# Patient Record
Sex: Male | Born: 1960 | Race: White | Hispanic: No | Smoking: Current every day smoker
Health system: Southern US, Community
[De-identification: ages and names within clinical notes are randomized; demographics above are authoritative.]

## PROBLEM LIST (undated history)

## (undated) DIAGNOSIS — G629 Polyneuropathy, unspecified: Secondary | ICD-10-CM

## (undated) DIAGNOSIS — I82409 Acute embolism and thrombosis of unspecified deep veins of unspecified lower extremity: Secondary | ICD-10-CM

## (undated) DIAGNOSIS — I10 Essential (primary) hypertension: Secondary | ICD-10-CM

## (undated) DIAGNOSIS — I8290 Acute embolism and thrombosis of unspecified vein: Secondary | ICD-10-CM

## (undated) DIAGNOSIS — M1990 Unspecified osteoarthritis, unspecified site: Secondary | ICD-10-CM

## (undated) HISTORY — PX: APPENDECTOMY: SHX54

## (undated) HISTORY — PX: VEIN LIGATION AND STRIPPING: SHX2653

---

## 1997-12-04 ENCOUNTER — Inpatient Hospital Stay (HOSPITAL_COMMUNITY): Admission: AD | Admit: 1997-12-04 | Discharge: 1997-12-06 | Payer: Self-pay | Admitting: Emergency Medicine

## 1997-12-07 ENCOUNTER — Emergency Department (HOSPITAL_COMMUNITY): Admission: EM | Admit: 1997-12-07 | Discharge: 1997-12-07 | Payer: Self-pay | Admitting: Emergency Medicine

## 1997-12-09 ENCOUNTER — Emergency Department (HOSPITAL_COMMUNITY): Admission: EM | Admit: 1997-12-09 | Discharge: 1997-12-09 | Payer: Self-pay | Admitting: Emergency Medicine

## 1998-09-25 ENCOUNTER — Emergency Department (HOSPITAL_COMMUNITY): Admission: EM | Admit: 1998-09-25 | Discharge: 1998-09-25 | Payer: Self-pay | Admitting: Emergency Medicine

## 1999-08-30 ENCOUNTER — Emergency Department (HOSPITAL_COMMUNITY): Admission: EM | Admit: 1999-08-30 | Discharge: 1999-08-30 | Payer: Self-pay | Admitting: Emergency Medicine

## 1999-08-30 ENCOUNTER — Encounter: Payer: Self-pay | Admitting: Emergency Medicine

## 2000-05-25 ENCOUNTER — Emergency Department (HOSPITAL_COMMUNITY): Admission: EM | Admit: 2000-05-25 | Discharge: 2000-05-25 | Payer: Self-pay | Admitting: Emergency Medicine

## 2003-12-21 ENCOUNTER — Other Ambulatory Visit: Payer: Self-pay

## 2004-01-17 ENCOUNTER — Emergency Department (HOSPITAL_COMMUNITY): Admission: EM | Admit: 2004-01-17 | Discharge: 2004-01-17 | Payer: Self-pay | Admitting: Emergency Medicine

## 2004-02-02 ENCOUNTER — Emergency Department (HOSPITAL_COMMUNITY): Admission: EM | Admit: 2004-02-02 | Discharge: 2004-02-03 | Payer: Self-pay | Admitting: Emergency Medicine

## 2004-02-07 ENCOUNTER — Emergency Department (HOSPITAL_COMMUNITY): Admission: EM | Admit: 2004-02-07 | Discharge: 2004-02-07 | Payer: Self-pay | Admitting: Emergency Medicine

## 2004-04-12 ENCOUNTER — Emergency Department (HOSPITAL_COMMUNITY): Admission: EM | Admit: 2004-04-12 | Discharge: 2004-04-12 | Payer: Self-pay | Admitting: *Deleted

## 2004-05-30 ENCOUNTER — Emergency Department: Payer: Self-pay | Admitting: General Practice

## 2004-06-21 ENCOUNTER — Emergency Department: Payer: Self-pay | Admitting: Emergency Medicine

## 2004-07-31 ENCOUNTER — Emergency Department: Payer: Self-pay | Admitting: Unknown Physician Specialty

## 2004-08-15 ENCOUNTER — Emergency Department: Payer: Self-pay | Admitting: Emergency Medicine

## 2004-08-18 ENCOUNTER — Emergency Department: Payer: Self-pay | Admitting: Emergency Medicine

## 2004-08-19 ENCOUNTER — Emergency Department: Payer: Self-pay | Admitting: Emergency Medicine

## 2004-08-25 ENCOUNTER — Emergency Department: Payer: Self-pay | Admitting: Emergency Medicine

## 2004-08-30 ENCOUNTER — Emergency Department: Payer: Self-pay | Admitting: Emergency Medicine

## 2004-09-16 ENCOUNTER — Emergency Department: Payer: Self-pay | Admitting: General Practice

## 2004-10-11 ENCOUNTER — Emergency Department: Payer: Self-pay | Admitting: Emergency Medicine

## 2004-11-07 ENCOUNTER — Emergency Department: Payer: Self-pay | Admitting: General Practice

## 2004-12-07 ENCOUNTER — Emergency Department: Payer: Self-pay | Admitting: Emergency Medicine

## 2004-12-16 ENCOUNTER — Emergency Department: Payer: Self-pay | Admitting: Emergency Medicine

## 2005-02-20 ENCOUNTER — Emergency Department: Payer: Self-pay | Admitting: Unknown Physician Specialty

## 2005-03-21 ENCOUNTER — Other Ambulatory Visit: Payer: Self-pay

## 2005-03-21 ENCOUNTER — Emergency Department: Payer: Self-pay | Admitting: Internal Medicine

## 2005-05-03 ENCOUNTER — Other Ambulatory Visit: Payer: Self-pay

## 2005-05-03 ENCOUNTER — Emergency Department: Payer: Self-pay | Admitting: Emergency Medicine

## 2005-08-25 ENCOUNTER — Emergency Department: Payer: Self-pay | Admitting: Emergency Medicine

## 2005-08-26 ENCOUNTER — Emergency Department: Payer: Self-pay | Admitting: General Practice

## 2005-09-10 ENCOUNTER — Emergency Department: Payer: Self-pay | Admitting: General Practice

## 2005-12-17 ENCOUNTER — Emergency Department: Payer: Self-pay | Admitting: Unknown Physician Specialty

## 2005-12-17 ENCOUNTER — Emergency Department: Payer: Self-pay | Admitting: Emergency Medicine

## 2006-01-02 ENCOUNTER — Emergency Department: Payer: Self-pay | Admitting: Emergency Medicine

## 2006-01-21 ENCOUNTER — Emergency Department: Payer: Self-pay | Admitting: Internal Medicine

## 2006-06-06 ENCOUNTER — Emergency Department: Payer: Self-pay | Admitting: Emergency Medicine

## 2006-07-10 ENCOUNTER — Emergency Department: Payer: Self-pay | Admitting: General Practice

## 2006-08-16 ENCOUNTER — Emergency Department: Payer: Self-pay | Admitting: Emergency Medicine

## 2006-09-29 ENCOUNTER — Other Ambulatory Visit: Payer: Self-pay

## 2006-09-29 ENCOUNTER — Emergency Department: Payer: Self-pay | Admitting: Emergency Medicine

## 2006-12-18 ENCOUNTER — Other Ambulatory Visit: Payer: Self-pay

## 2006-12-18 ENCOUNTER — Emergency Department: Payer: Self-pay

## 2006-12-24 ENCOUNTER — Emergency Department: Payer: Self-pay | Admitting: Emergency Medicine

## 2006-12-24 ENCOUNTER — Other Ambulatory Visit: Payer: Self-pay

## 2006-12-28 ENCOUNTER — Emergency Department: Payer: Self-pay | Admitting: Unknown Physician Specialty

## 2007-08-11 ENCOUNTER — Emergency Department: Payer: Self-pay | Admitting: Emergency Medicine

## 2007-10-21 ENCOUNTER — Emergency Department: Payer: Self-pay | Admitting: Emergency Medicine

## 2007-10-25 ENCOUNTER — Emergency Department: Payer: Self-pay | Admitting: Internal Medicine

## 2007-12-04 ENCOUNTER — Emergency Department: Payer: Self-pay | Admitting: Emergency Medicine

## 2007-12-29 ENCOUNTER — Emergency Department: Payer: Self-pay | Admitting: Emergency Medicine

## 2008-02-02 ENCOUNTER — Emergency Department: Payer: Self-pay | Admitting: Emergency Medicine

## 2008-02-18 ENCOUNTER — Emergency Department: Payer: Self-pay | Admitting: Emergency Medicine

## 2008-02-23 ENCOUNTER — Emergency Department: Payer: Self-pay

## 2008-03-04 ENCOUNTER — Other Ambulatory Visit: Payer: Self-pay

## 2008-03-04 ENCOUNTER — Emergency Department: Payer: Self-pay | Admitting: Emergency Medicine

## 2008-03-14 ENCOUNTER — Emergency Department: Payer: Self-pay | Admitting: Emergency Medicine

## 2008-07-07 ENCOUNTER — Emergency Department: Payer: Self-pay | Admitting: Emergency Medicine

## 2008-07-23 ENCOUNTER — Emergency Department: Payer: Self-pay | Admitting: Unknown Physician Specialty

## 2008-07-26 ENCOUNTER — Emergency Department: Payer: Self-pay | Admitting: Emergency Medicine

## 2008-07-30 ENCOUNTER — Emergency Department: Payer: Self-pay | Admitting: Emergency Medicine

## 2008-08-11 ENCOUNTER — Emergency Department: Payer: Self-pay | Admitting: Emergency Medicine

## 2008-08-18 ENCOUNTER — Emergency Department: Payer: Self-pay | Admitting: Emergency Medicine

## 2008-10-22 ENCOUNTER — Emergency Department: Payer: Self-pay | Admitting: Emergency Medicine

## 2008-11-05 ENCOUNTER — Emergency Department: Payer: Self-pay | Admitting: Emergency Medicine

## 2008-12-03 ENCOUNTER — Emergency Department: Payer: Self-pay | Admitting: Emergency Medicine

## 2009-01-20 ENCOUNTER — Emergency Department: Payer: Self-pay | Admitting: Emergency Medicine

## 2009-02-01 ENCOUNTER — Emergency Department: Payer: Self-pay | Admitting: Unknown Physician Specialty

## 2009-03-24 ENCOUNTER — Emergency Department: Payer: Self-pay | Admitting: Emergency Medicine

## 2009-05-10 ENCOUNTER — Emergency Department: Payer: Self-pay | Admitting: Emergency Medicine

## 2009-11-13 ENCOUNTER — Emergency Department: Payer: Self-pay | Admitting: Emergency Medicine

## 2009-11-22 ENCOUNTER — Emergency Department: Payer: Self-pay | Admitting: Emergency Medicine

## 2009-12-05 IMAGING — US US EXTREM LOW VENOUS*L*
1 series · 17 of 22 positions shown · non-contrast
Comparison: none

REASON FOR EXAM: pain swelling left leg from groin to calf
COMMENTS:   LMP: (Male)

[Series 1: us extrem low venous*left* · 17 of 22 slices shown]
[im 1/22]
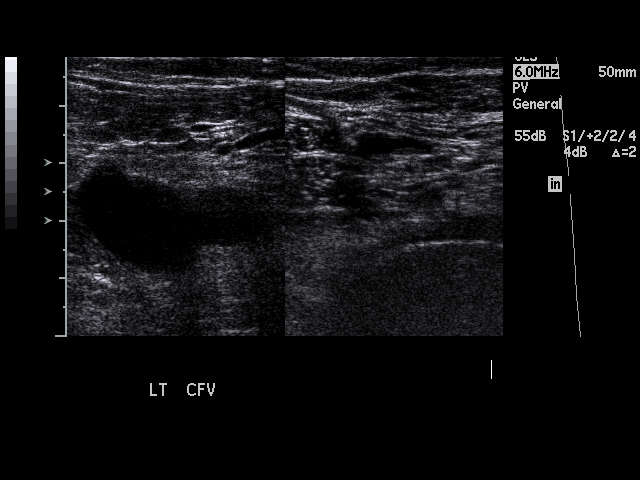
[im 2/22]
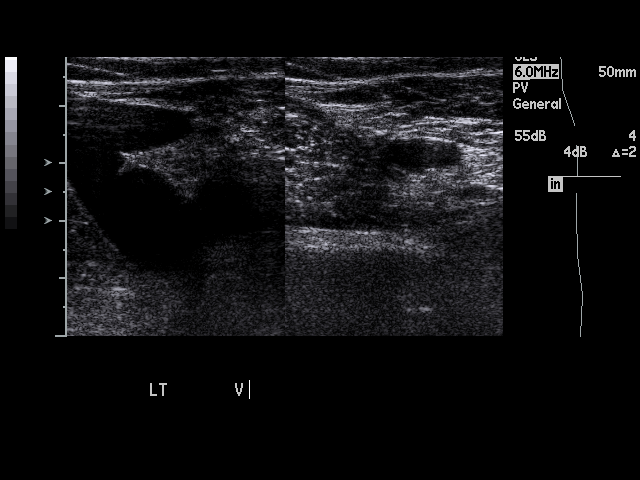
[im 4/22]
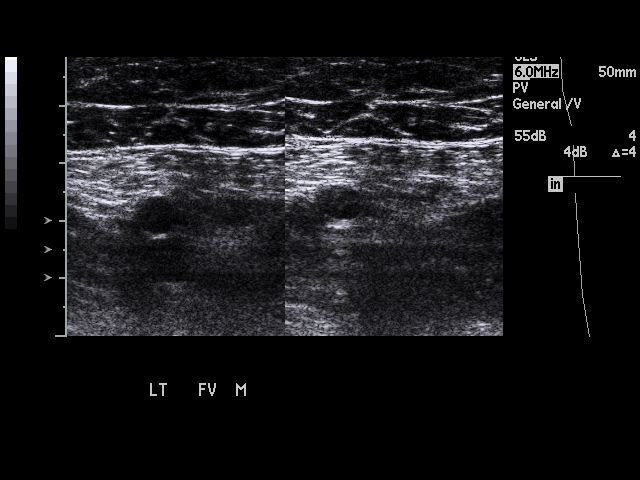
[im 5/22]
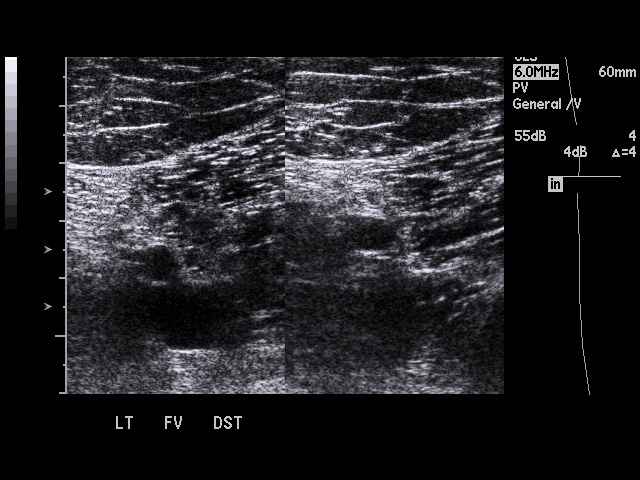
[im 6/22]
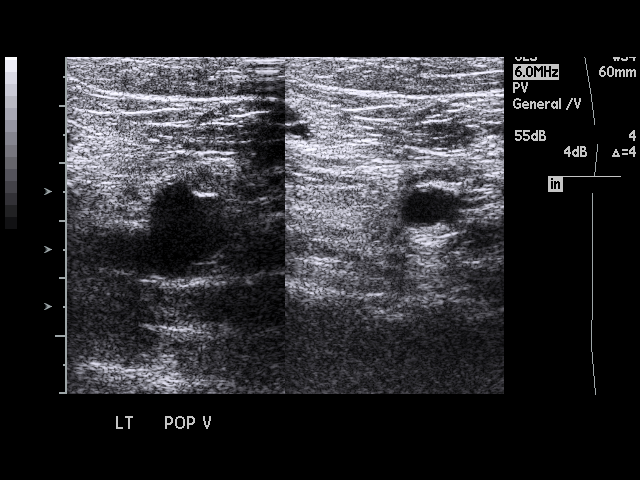
[im 8/22]
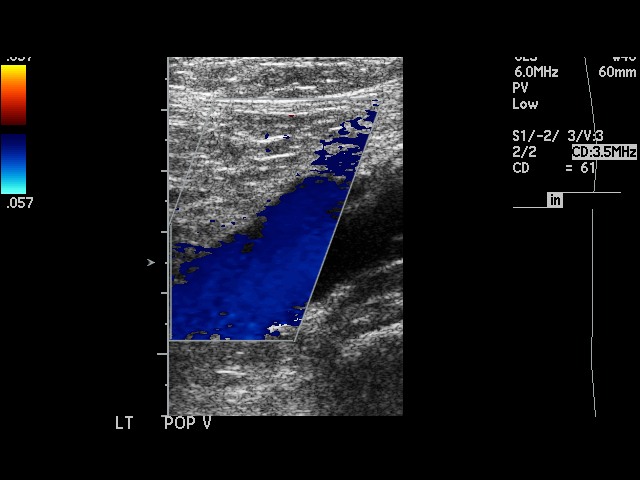
[im 9/22]
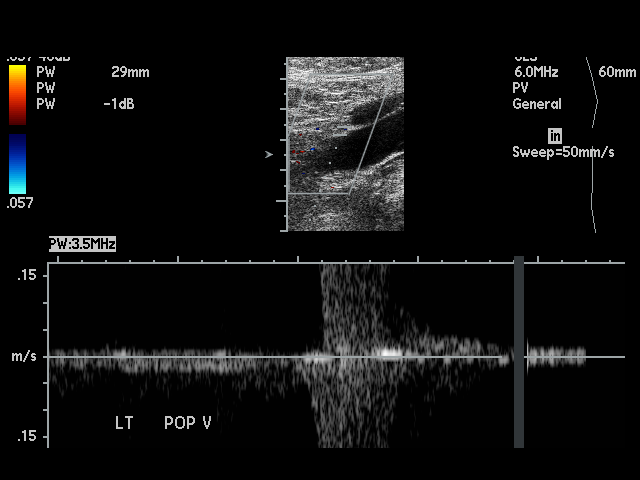
[im 10/22]
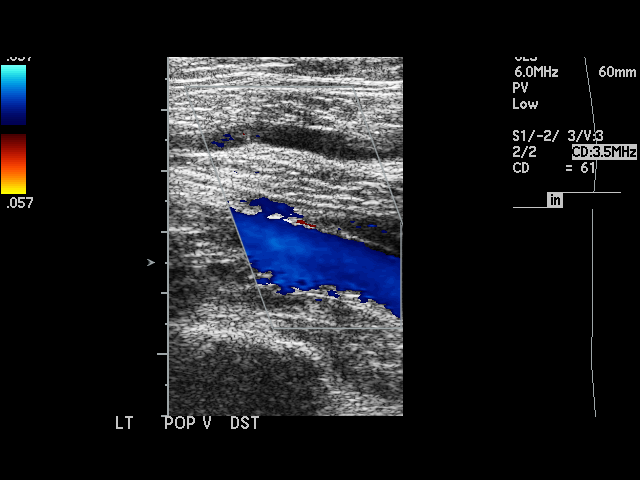
[im 12/22]
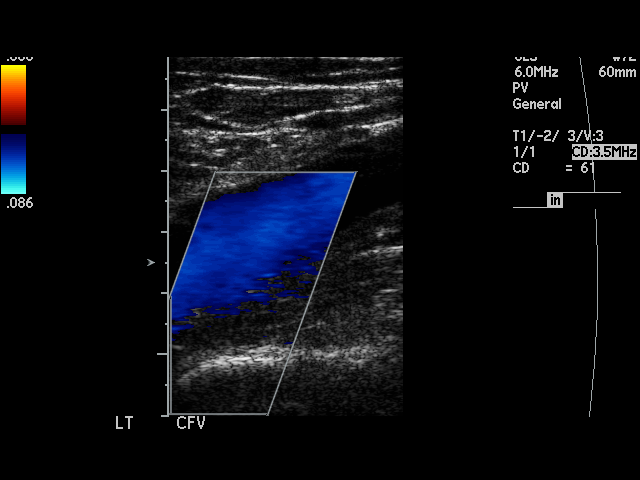
[im 13/22]
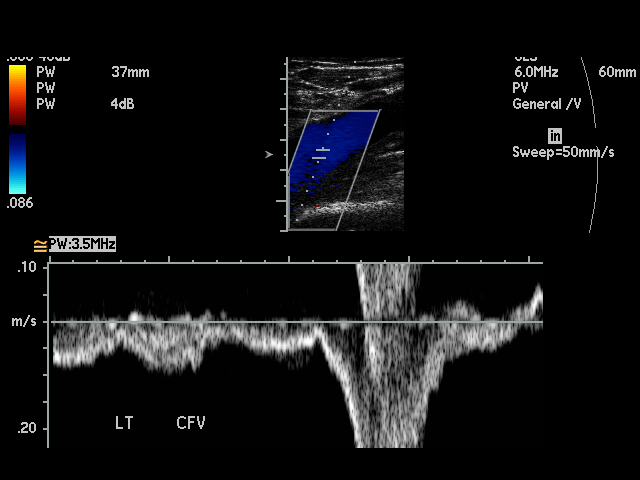
[im 14/22]
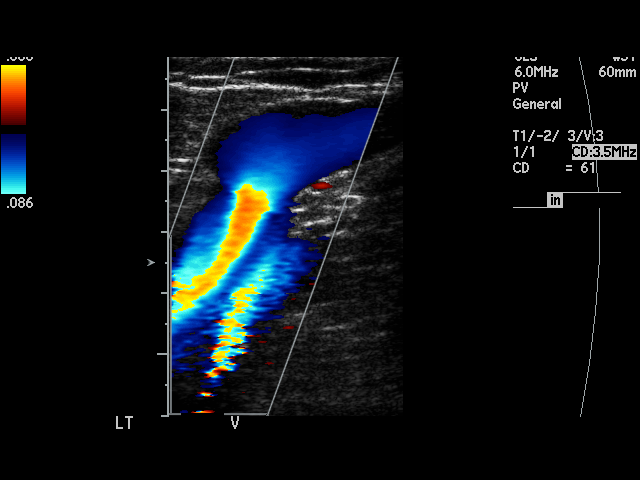
[im 15/22]
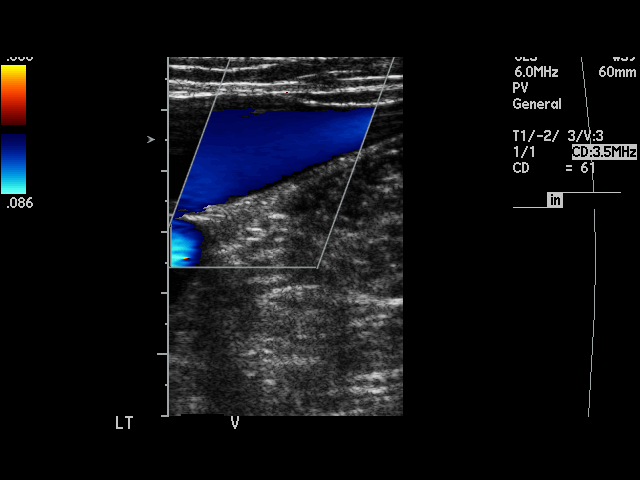
[im 17/22]
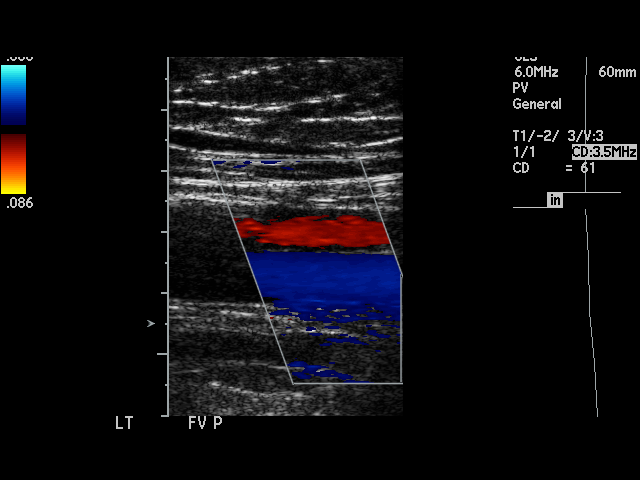
[im 18/22]
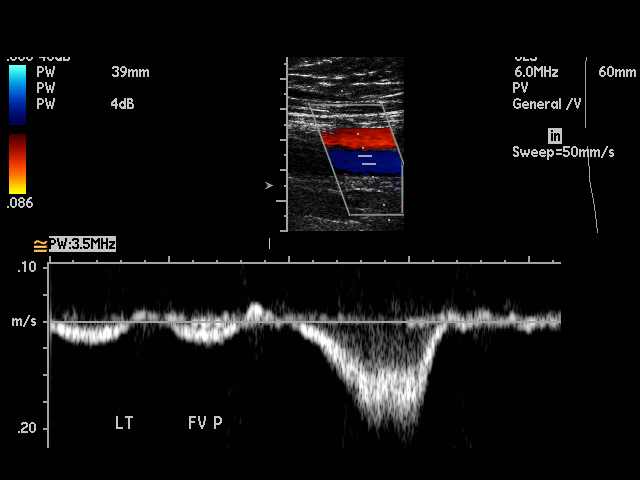
[im 19/22]
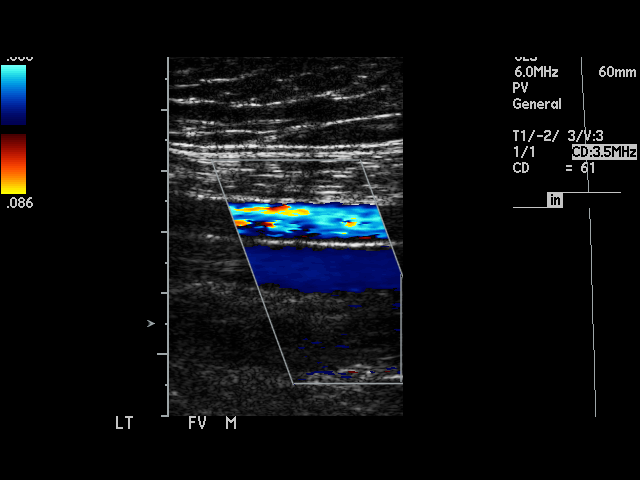
[im 21/22]
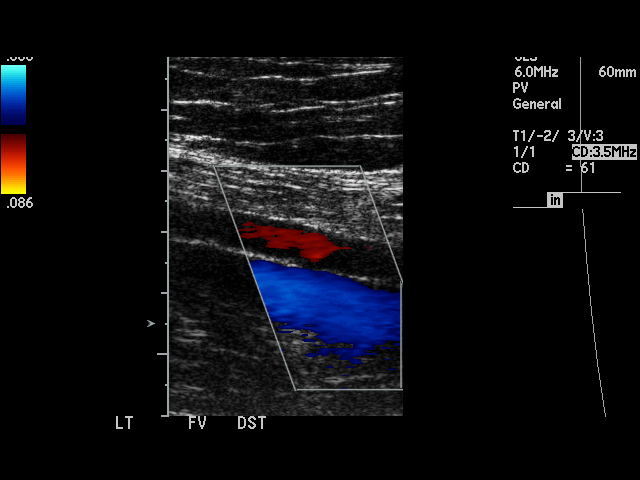
[im 22/22]
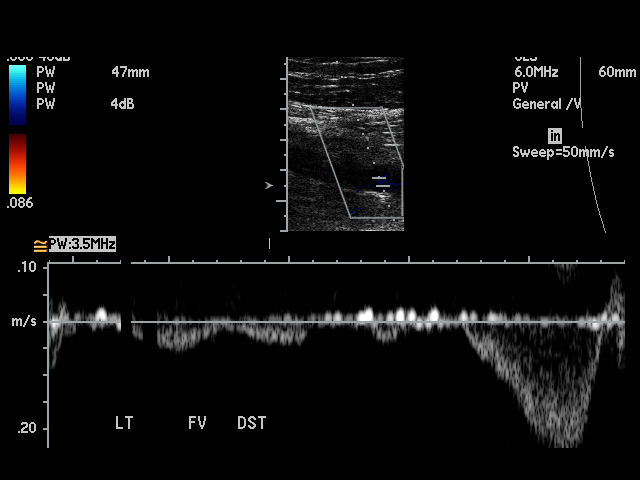

[17 of 22 positions shown; findings below may reference images not displayed]

PROCEDURE:     US  - US DOPPLER LOW EXTR LEFT  - February 18, 2008  [DATE]

RESULT:     Duplex Doppler interrogation of the deep venous system of the
left leg is performed from the inguinal through the popliteal region. There
is no previous exam for comparison. The color and spectral Doppler
appearance is normal. There is increased proximal flow with distal calf
compression. No thrombus is evident. The deep venous structures are fully
compressible.
IMPRESSION: 1. No evidence of left lower extremity deep vein thrombosis.

## 2009-12-08 ENCOUNTER — Emergency Department: Payer: Self-pay | Admitting: Emergency Medicine

## 2010-01-22 ENCOUNTER — Emergency Department: Payer: Self-pay | Admitting: Emergency Medicine

## 2010-02-12 ENCOUNTER — Emergency Department: Payer: Self-pay | Admitting: Emergency Medicine

## 2010-03-16 ENCOUNTER — Emergency Department: Payer: Self-pay | Admitting: Emergency Medicine

## 2010-04-30 ENCOUNTER — Emergency Department: Payer: Self-pay | Admitting: Emergency Medicine

## 2010-05-10 IMAGING — US US SOFT TISSUE EXCLUDE HEAD/NECK
1 series · 16 of 16 positions shown · non-contrast
Comparison: none

REASON FOR EXAM: swelling left leg
COMMENTS:   LMP: (Male)

[Series 1: us soft tissue exclude head/neck · 16 of 16 slices shown]
[im 1/16]
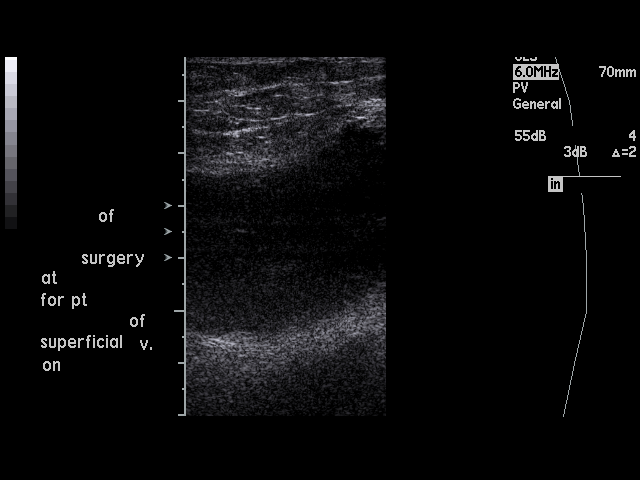
[im 2/16]
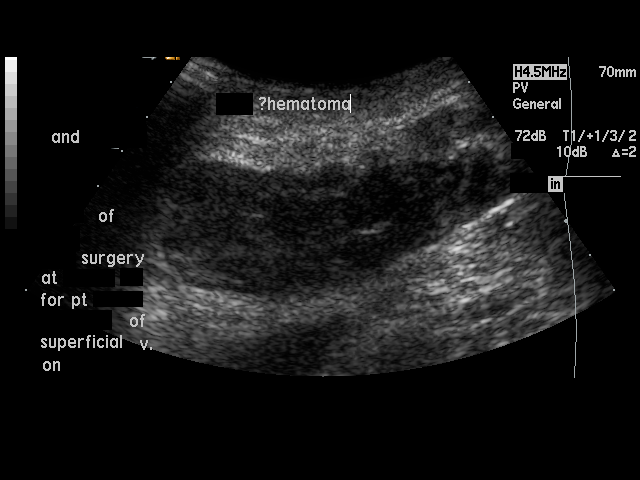
[im 3/16]
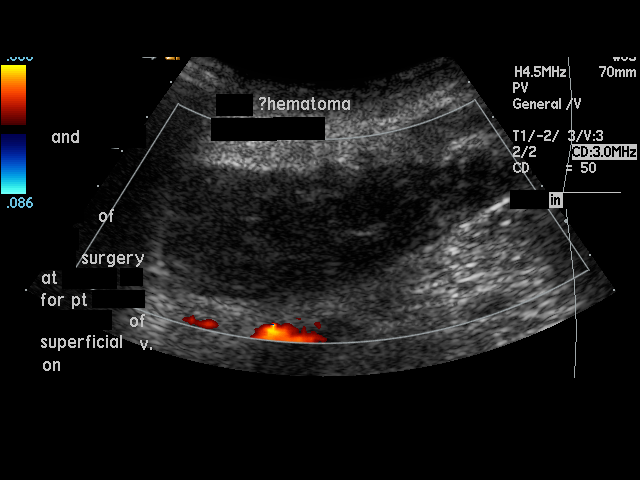
[im 4/16]
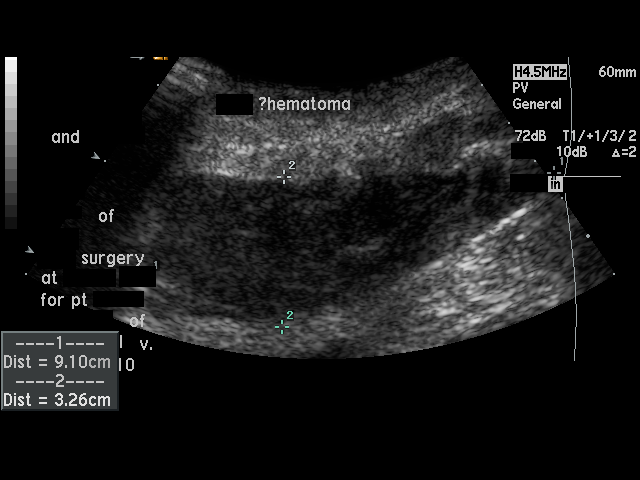
[im 5/16]
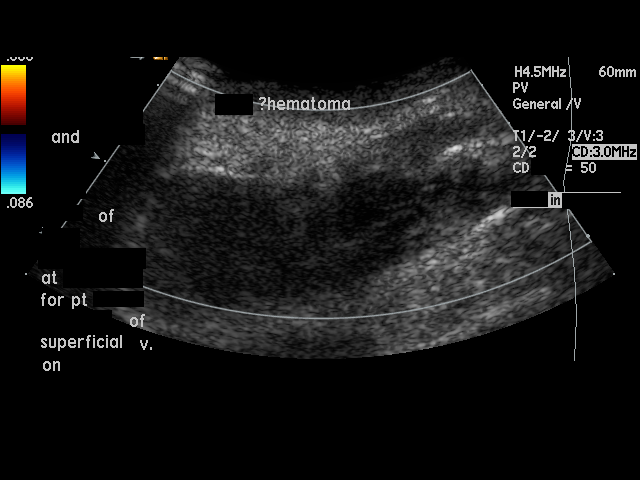
[im 6/16]
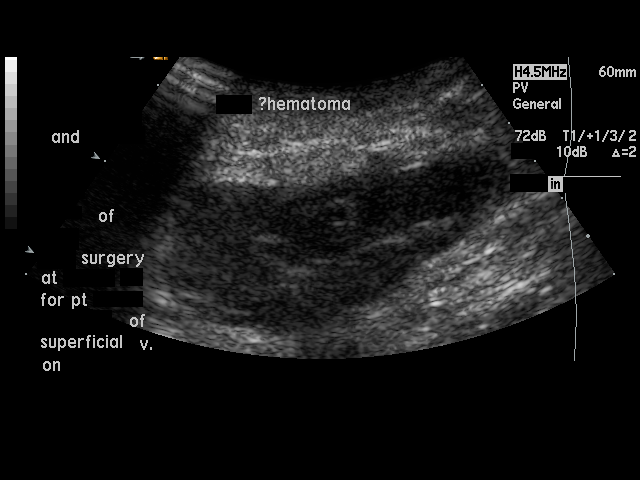
[im 7/16]
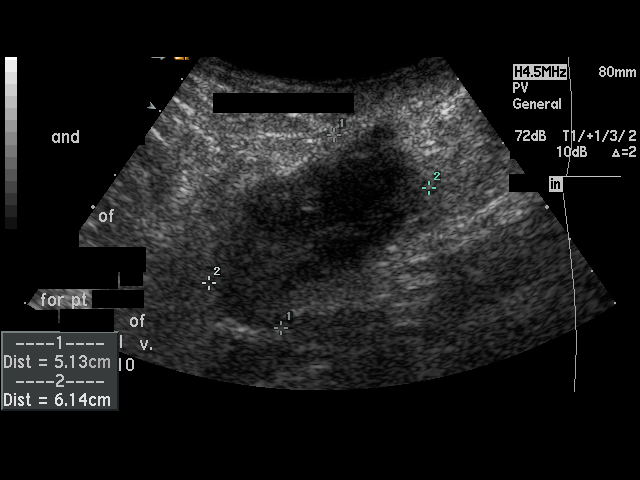
[im 8/16]
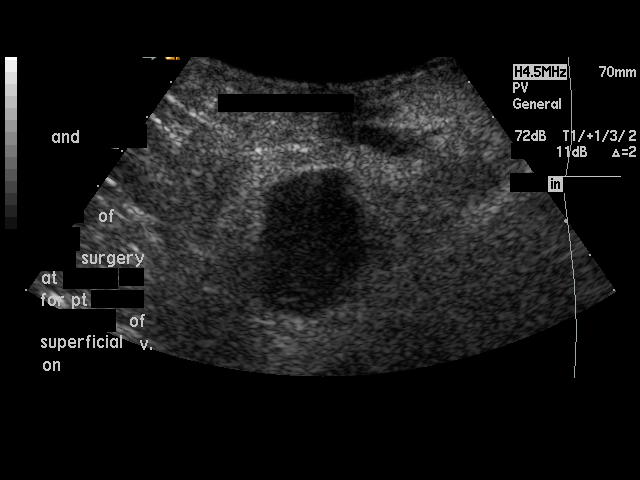
[im 9/16]
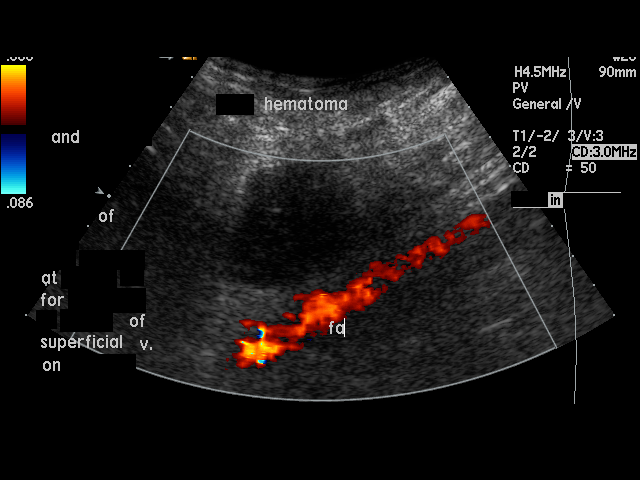
[im 10/16]
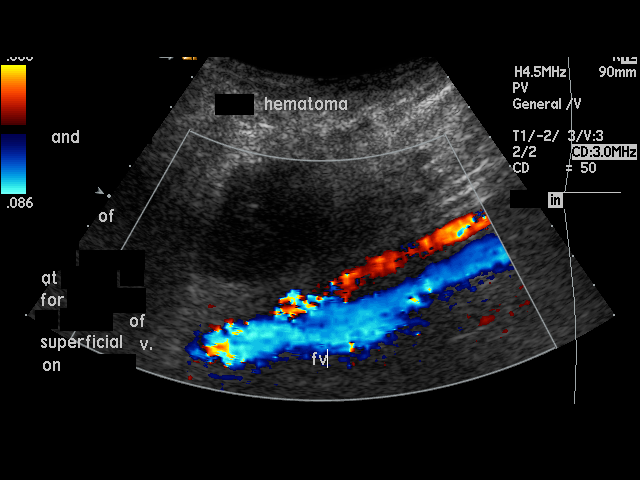
[im 11/16]
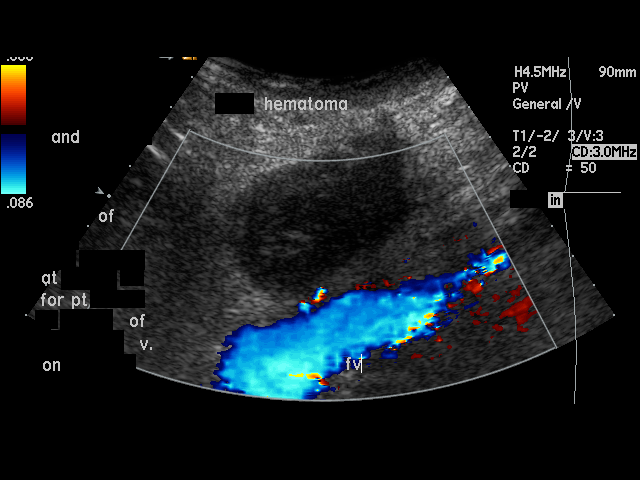
[im 12/16]
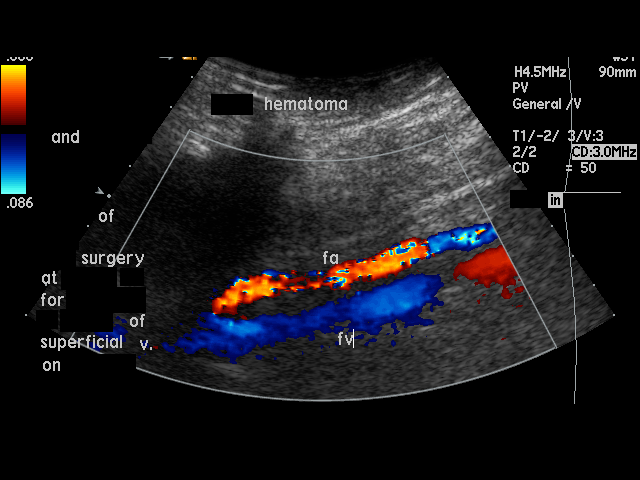
[im 13/16]
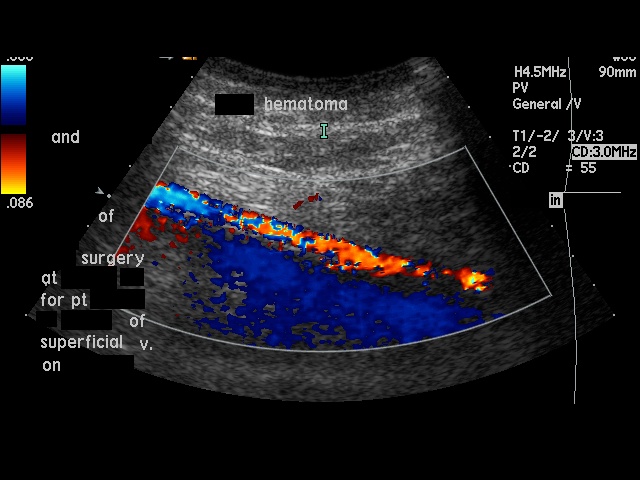
[im 14/16]
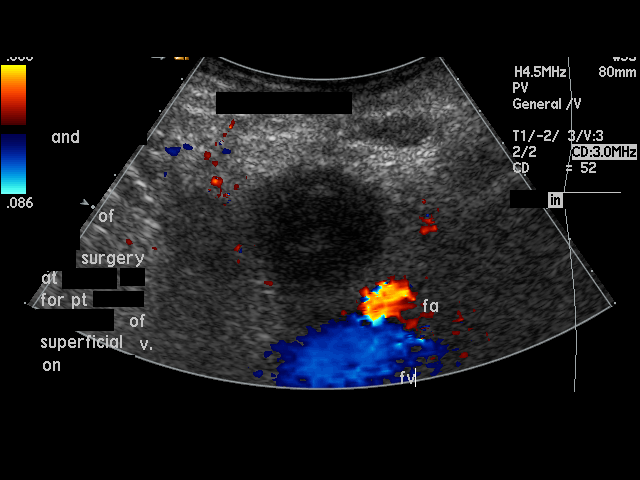
[im 15/16]
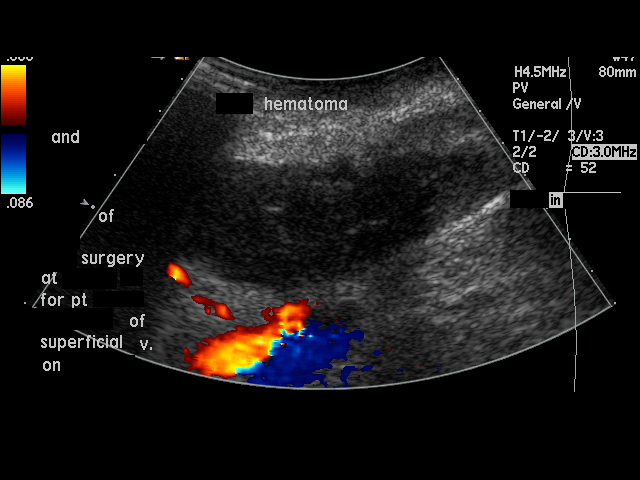
[im 16/16]
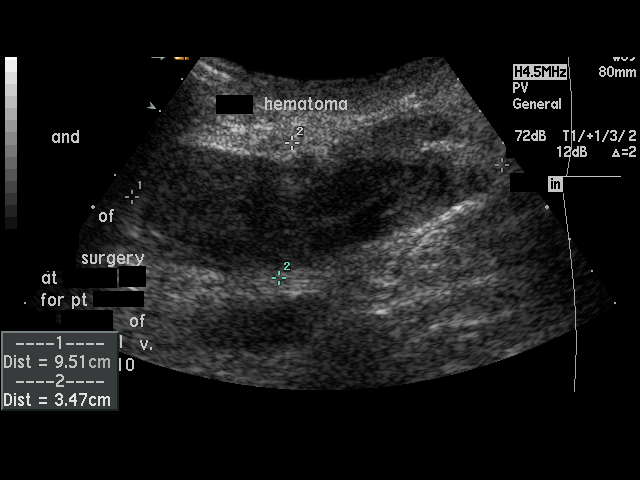

[16 of 16 positions shown; findings below may reference images not displayed]

PROCEDURE:     US  - US SOFT TISSUE, NOT NECK /  HEAD  - July 24, 2008 [DATE]

RESULT:      The patient is complaining of tenderness in the LEFT groin. The
patient reportedly underwent a surgical procedure involving the femoral vein
on the LEFT approximately 6 days ago.

In the LEFT groin, there is a hypoechoic heterogeneous echotexture ovoid
area measuring 9.1 x 5.1 x 6.1 cm. This may reflect an area either hematoma
or interstitial edema or hemorrhage. No evidence of vascular flow within
this ovoid mass-like area was demonstrated. Flow can be seen in the femoral
artery and femoral vein.
IMPRESSION: There is a hypoechoic focus in the LEFT groin which likely reflects a
hematoma. There is no evidence of pseudoaneurysm. Normal appearing flow in
the common femoral artery and common femoral vein is seen nearby.

A preliminary report sent to the [HOSPITAL] the conclusion of
the study.

## 2010-05-24 ENCOUNTER — Emergency Department: Payer: Self-pay | Admitting: Emergency Medicine

## 2010-06-15 ENCOUNTER — Emergency Department: Payer: Self-pay | Admitting: Internal Medicine

## 2011-01-19 ENCOUNTER — Emergency Department: Payer: Self-pay | Admitting: Emergency Medicine

## 2011-04-08 ENCOUNTER — Emergency Department: Payer: Self-pay | Admitting: Internal Medicine

## 2011-05-11 ENCOUNTER — Emergency Department (HOSPITAL_COMMUNITY)
Admission: EM | Admit: 2011-05-11 | Discharge: 2011-05-11 | Disposition: A | Discharge status: Home / Self Care | Payer: Self-pay | Attending: Emergency Medicine | Admitting: Emergency Medicine

## 2011-05-11 DIAGNOSIS — Z760 Encounter for issue of repeat prescription: Secondary | ICD-10-CM | POA: Insufficient documentation

## 2011-05-11 DIAGNOSIS — E119 Type 2 diabetes mellitus without complications: Secondary | ICD-10-CM | POA: Insufficient documentation

## 2011-05-11 DIAGNOSIS — Z79899 Other long term (current) drug therapy: Secondary | ICD-10-CM | POA: Insufficient documentation

## 2011-05-11 DIAGNOSIS — Z9889 Other specified postprocedural states: Secondary | ICD-10-CM | POA: Insufficient documentation

## 2011-05-11 DIAGNOSIS — E1165 Type 2 diabetes mellitus with hyperglycemia: Secondary | ICD-10-CM | POA: Diagnosis present

## 2011-05-11 DIAGNOSIS — F17200 Nicotine dependence, unspecified, uncomplicated: Secondary | ICD-10-CM | POA: Insufficient documentation

## 2011-05-11 DIAGNOSIS — Z86718 Personal history of other venous thrombosis and embolism: Secondary | ICD-10-CM | POA: Insufficient documentation

## 2011-05-11 DIAGNOSIS — R739 Hyperglycemia, unspecified: Secondary | ICD-10-CM

## 2011-05-11 DIAGNOSIS — I10 Essential (primary) hypertension: Secondary | ICD-10-CM | POA: Diagnosis present

## 2011-05-11 HISTORY — DX: Acute embolism and thrombosis of unspecified deep veins of unspecified lower extremity: I82.409

## 2011-05-11 HISTORY — DX: Essential (primary) hypertension: I10

## 2011-05-11 LAB — GLUCOSE, CAPILLARY: Glucose-Capillary: 225 mg/dL — ABNORMAL HIGH (ref 70–99)

## 2011-05-11 MED ORDER — METFORMIN HCL 500 MG PO TABS: 500.0000 mg | ORAL_TABLET | Freq: Three times a day (TID) | ORAL | Status: DC

## 2011-05-11 MED ORDER — FREESTYLE SYSTEM KIT: 1.0000 | PACK | Status: DC | PRN

## 2011-05-11 MED ORDER — METOPROLOL TARTRATE 50 MG PO TABS: 50.0000 mg | ORAL_TABLET | Freq: Two times a day (BID) | ORAL | Status: DC

## 2011-05-11 NOTE — ED Provider Notes (Signed)
History     CSN: [id number] Arrival date & time: 05/11/2011 11:12 AM   First MD Initiated Contact with Patient 05/11/11 1208      Chief Complaint  Patient presents with  . Medication Refill    (Consider location/radiation/quality/duration/timing/severity/associated sxs/prior treatment) HPI Comments: Patient reports he is about to run out of his metformin and he is out of his metoprolol.  States he is seen at the "[facility]" in [place], but could not schedule a follow up appointment for more than 2 months.  States he is out of options and cannot find a provider and cannot go without his medications.  States he takes metformin 500mg  TID and metoprolol 50mg  BID.  Checks his blood pressure at the local pharmacy and states it has been well controlled.  He has not cut back on his pills or dosage until this morning when he took half a pill to conserve his supply.  Does not check his blood sugar regularly anymore because of lack of equipment but has been feeling fine and feels his blood sugars are well controlled.  Denies CP, SOB, HA, weakness, lightheadedness, polydipsia, or polyuria.    The history is provided by the patient. No language interpreter was used.    Past Medical History  Diagnosis Date  . Hypertension   . Diabetes mellitus   . DVT (deep venous thrombosis)     Past Surgical History  Procedure Date  . Appendectomy     History reviewed. No pertinent family history.  History  Substance Use Topics  . Smoking status: Current Everyday Smoker    Types: Cigarettes  . Smokeless tobacco: Not on file  . Alcohol Use: No      Review of Systems  All other systems reviewed and are negative.    Allergies  Erythromycin base and Sulfa antibiotics  Home Medications   Current Outpatient Rx  Name Route Sig Dispense Refill  . METFORMIN HCL 500 MG PO TABS Oral Take 500 mg by mouth 3 (three) times daily.      Marland Kitchen METOPROLOL TARTRATE 50 MG PO TABS Oral Take 50 mg by mouth  2 (two) times daily.        BP 117/81  Pulse 65  Temp(Src) 97.7 F (36.5 C) (Oral)  Resp 20  SpO2 97%  Physical Exam  Constitutional: He is oriented to person, place, and time. He appears well-developed and well-nourished.  HENT:  Head: Normocephalic and atraumatic.  Neck: Neck supple.  Cardiovascular: Normal rate, regular rhythm and normal heart sounds.   Pulmonary/Chest: Effort normal and breath sounds normal. No respiratory distress. He has no wheezes. He has no rales. He exhibits no tenderness.  Abdominal: Soft. Bowel sounds are normal. There is no tenderness.  Neurological: He is alert and oriented to person, place, and time.    ED Course  Procedures (including critical care time)  Labs Reviewed  GLUCOSE, CAPILLARY - Abnormal; Notable for the following:    Glucose-Capillary 225 (*)    All other components within normal limits  POCT CBG MONITORING   No results found.   1. Medication refill   2. Hyperglycemia   3. Diabetes mellitus       MDM  Patient with known HTN and DM running out of his medications, came in for medication refill.  He has not been checking his blood sugars but is feeling well.  Have encouraged close follow up and close monitoring of blood sugars.         Irving Burton  Leonard Schwartz [place], [place] 05/11/11 1338

## 2011-05-11 NOTE — ED Provider Notes (Signed)
Medical screening examination/treatment/procedure(s) were performed by non-physician practitioner and as supervising physician I was immediately available for consultation/collaboration.  Doug Sou, MD 05/11/11 2027

## 2011-05-11 NOTE — ED Notes (Signed)
CBG 225. 

## 2011-05-11 NOTE — ED Notes (Signed)
Pt needs meds for bp. sts took 1/2 pill this morning.

## 2011-06-01 ENCOUNTER — Emergency Department: Payer: Self-pay | Admitting: *Deleted

## 2011-06-30 ENCOUNTER — Emergency Department: Payer: Self-pay | Admitting: Emergency Medicine

## 2011-07-16 ENCOUNTER — Emergency Department (HOSPITAL_COMMUNITY)
Admission: EM | Admit: 2011-07-16 | Discharge: 2011-07-16 | Disposition: A | Discharge status: Home / Self Care | Payer: Self-pay | Attending: Emergency Medicine | Admitting: Emergency Medicine

## 2011-07-16 DIAGNOSIS — Z760 Encounter for issue of repeat prescription: Secondary | ICD-10-CM | POA: Insufficient documentation

## 2011-07-16 DIAGNOSIS — E119 Type 2 diabetes mellitus without complications: Secondary | ICD-10-CM | POA: Insufficient documentation

## 2011-07-16 DIAGNOSIS — I10 Essential (primary) hypertension: Secondary | ICD-10-CM | POA: Insufficient documentation

## 2011-07-16 MED ORDER — METFORMIN HCL 500 MG PO TABS: 500.0000 mg | ORAL_TABLET | Freq: Three times a day (TID) | ORAL | Status: DC

## 2011-07-16 MED ORDER — METOPROLOL TARTRATE 100 MG PO TABS: 50.0000 mg | ORAL_TABLET | Freq: Two times a day (BID) | ORAL | Status: DC

## 2011-07-16 NOTE — ED Notes (Signed)
Pt. Stated, cause I need my BP meds.  Trying to get my Dr. Janese Banks and I'm in between Doctors

## 2011-07-16 NOTE — ED Provider Notes (Signed)
History     CSN: [id number]  Arrival date & time 07/16/11  1149   First MD Initiated Contact with Patient 07/16/11 1234      Chief Complaint  Patient presents with  . Hypertension    nedd BP med and Diabetes med   Patient states that he is almost about to run out of his current medications which include metformin 500 mg 3 times per day. With meals. He also takes metoprolol 50 mg twice per day. He states he has a friend that is taken to a clinic in [facility], but he'll be following up in. He denies any current symptoms. He is here today just for refills. No history of hypertension, diabetes, and previous DVT. Patient is a smoker. Denies any drug or alcohol (Consider location/radiation/quality/duration/timing/severity/associated sxs/prior treatment) HPI  Past Medical History  Diagnosis Date  . Hypertension   . Diabetes mellitus   . DVT (deep venous thrombosis)     Past Surgical History  Procedure Date  . Appendectomy     No family history on file.  History  Substance Use Topics  . Smoking status: Current Everyday Smoker    Types: Cigarettes  . Smokeless tobacco: Not on file  . Alcohol Use: No      Review of Systems  All other systems reviewed and are negative.    Allergies  Erythromycin base; Robaxin; and Sulfa antibiotics  Home Medications   Current Outpatient Rx  Name Route Sig Dispense Refill  . ACETAMINOPHEN 500 MG PO TABS Oral Take 500 mg by mouth daily as needed. For pain.    Marland Kitchen AMOXICILLIN 500 MG PO CAPS Oral Take 500 mg by mouth daily as needed. Flu like symptoms.    Marland Kitchen BISMUTH SUBSALICYLATE 262 MG/15ML PO SUSP Oral Take 15 mLs by mouth daily as needed. Acid reflux/stomach acid.    Jule Ser EX Apply externally Apply 1 application topically 5 (five) times daily.    Marland Kitchen METFORMIN HCL 500 MG PO TABS Oral Take 500-1,000 mg by mouth 2 (two) times daily with a meal.    . METOPROLOL SUCCINATE ER 50 MG PO TB24 Oral Take 50 mg by mouth daily. Take with or  immediately following a meal.      BP 126/87  Pulse 74  Temp(Src) 98.1 F (36.7 C) (Oral)  Resp 20  SpO2 97%  Physical Exam  Nursing note and vitals reviewed. Constitutional: He is oriented to person, place, and time. He appears well-developed and well-nourished.  HENT:  Head: Normocephalic and atraumatic.  Eyes: Conjunctivae and EOM are normal. Pupils are equal, round, and reactive to light.  Neck: Neck supple.  Cardiovascular: Normal rate and regular rhythm.  Exam reveals no gallop and no friction rub.   No murmur heard. Pulmonary/Chest: Breath sounds normal. He has no wheezes. He has no rales. He exhibits no tenderness.  Abdominal: Soft. Bowel sounds are normal. He exhibits no distension. There is no tenderness. There is no rebound and no guarding.  Musculoskeletal: Normal range of motion.  Neurological: He is alert and oriented to person, place, and time. No cranial nerve deficit. Coordination normal.  Skin: Skin is warm and dry. No rash noted.  Psychiatric: He has a normal mood and affect.    ED Course  Procedures (including critical care time)   Labs Reviewed  POCT CBG MONITORING   No results found.   No diagnosis found.    MDM  Pt is seen and examined;  Initial history and physical completed.  Will follow.          Rafferty Postlewait A. Patrica Duel, MD 07/16/11 1241

## 2011-07-27 ENCOUNTER — Emergency Department (HOSPITAL_COMMUNITY)
Admission: EM | Admit: 2011-07-27 | Discharge: 2011-07-27 | Disposition: A | Discharge status: Home / Self Care | Payer: Self-pay | Attending: Emergency Medicine | Admitting: Emergency Medicine

## 2011-07-27 ENCOUNTER — Encounter (HOSPITAL_COMMUNITY): Payer: Self-pay | Admitting: *Deleted

## 2011-07-27 DIAGNOSIS — E119 Type 2 diabetes mellitus without complications: Secondary | ICD-10-CM | POA: Insufficient documentation

## 2011-07-27 DIAGNOSIS — Z720 Tobacco use: Secondary | ICD-10-CM

## 2011-07-27 DIAGNOSIS — F17200 Nicotine dependence, unspecified, uncomplicated: Secondary | ICD-10-CM | POA: Insufficient documentation

## 2011-07-27 DIAGNOSIS — I10 Essential (primary) hypertension: Secondary | ICD-10-CM | POA: Insufficient documentation

## 2011-07-27 DIAGNOSIS — Z86718 Personal history of other venous thrombosis and embolism: Secondary | ICD-10-CM | POA: Insufficient documentation

## 2011-07-27 DIAGNOSIS — Z760 Encounter for issue of repeat prescription: Secondary | ICD-10-CM | POA: Insufficient documentation

## 2011-07-27 MED ORDER — METOPROLOL TARTRATE 50 MG PO TABS: ORAL_TABLET | ORAL | Status: DC

## 2011-07-27 MED ORDER — METFORMIN HCL 500 MG PO TABS: ORAL_TABLET | ORAL | Status: DC

## 2011-07-27 NOTE — ED Provider Notes (Signed)
History     CSN: [id number]  Arrival date & time 07/27/11  1317   First MD Initiated Contact with Patient 07/27/11 1523      Chief Complaint  Patient presents with  . Medication Refill    (Consider location/radiation/quality/duration/timing/severity/associated sxs/prior treatment) HPI  Patient presents to ER requesting medication refill for both metformin and metoprolol. Patient states that he is almost about to run out of his current medications which include metformin 500 mg 3 times per day. With meals. He also takes metoprolol 50 mg twice per day. Patient has been seen in the past by a clinic in [place] and [facility] for PCP and is currently attempting to re-establish with [facility] and or apply for medicaid due to financial difficulties but has not been able to establish a close follow up and his prescriptions are running out. He denies any current symptoms. He is here today just for refills. Patient states he has attempted to wean himself off metoprolol in the past but with out success stating "my blood pressure goes sky high if I don't take it."  history of hypertension, diabetes, and previous DVT. Patient is a smoker. Denies any drug or alcohol  (Consider location/radiation/quality/duration/timing/severity/associated sxs/prior  Past Medical History  Diagnosis Date  . Hypertension   . Diabetes mellitus   . DVT (deep venous thrombosis)     Past Surgical History  Procedure Date  . Appendectomy     No family history on file.  History  Substance Use Topics  . Smoking status: Current Everyday Smoker -- 1.0 packs/day    Types: Cigarettes  . Smokeless tobacco: Not on file  . Alcohol Use: No      Review of Systems  All other systems reviewed and are negative.    Allergies  Erythromycin base; Robaxin; and Sulfa antibiotics  Home Medications   Current Outpatient Rx  Name Route Sig Dispense Refill  . ACETAMINOPHEN 500 MG PO TABS Oral Take 1,000 mg by mouth  daily as needed. For pain.    Marland Kitchen BISMUTH SUBSALICYLATE 262 MG/15ML PO SUSP Oral Take 15 mLs by mouth daily as needed. Acid reflux/stomach acid.    Jule Ser EX Apply externally Apply 1 application topically 5 (five) times daily.    Marland Kitchen METFORMIN HCL 500 MG PO TABS Oral Take 1 tablet (500 mg total) by mouth 3 (three) times daily with meals. 20 tablet 0  . METOPROLOL TARTRATE 50 MG PO TABS Oral Take 50 mg by mouth 2 (two) times daily.      BP 120/83  Pulse 56  Temp(Src) 97.6 F (36.4 C) (Oral)  Resp 20  SpO2 99%  Physical Exam  Nursing note and vitals reviewed. Constitutional: He is oriented to person, place, and time. He appears well-developed and well-nourished. No distress.  HENT:  Head: Normocephalic and atraumatic.  Eyes: Conjunctivae are normal.  Neck: Normal range of motion. Neck supple.  Cardiovascular: Normal rate, regular rhythm, normal heart sounds and intact distal pulses.  Exam reveals no gallop and no friction rub.   No murmur heard. Pulmonary/Chest: Effort normal and breath sounds normal. No respiratory distress. He has no wheezes. He has no rales. He exhibits no tenderness.  Abdominal: Soft. Bowel sounds are normal. He exhibits no distension and no mass. There is no tenderness. There is no rebound and no guarding.  Musculoskeletal: Normal range of motion. He exhibits no edema and no tenderness.  Neurological: He is alert and oriented to person, place, and time.  Skin: Skin  is warm and dry. No rash noted. He is not diaphoretic. No erythema.  Psychiatric: He has a normal mood and affect.    ED Course  Procedures (including critical care time)  Labs Reviewed - No data to display No results found.   1. Hypertension   2. Diabetes mellitus   3. Tobacco abuse       MDM  Patient with VS and normal and asymtpomatic requesting prescription refill. Both myself and nurse case manager, Selena Batten spoke without patient about resources for primary care.         [place], [place] 07/27/11 1616

## 2011-07-27 NOTE — ED Notes (Signed)
Pt sts he needs BP and diabetes meds. Seen here multiple times for same. Given 10 days worth on 1/12 and sts he took last dose this am. Was being followed by [facility] in [place] but wait was too long. Went to [facility] but they were closed. Does not have a Dr at this time.

## 2011-07-27 NOTE — Progress Notes (Signed)
ED CM spoke with pt who confirms he is from [facility]. Cm reviewed importance of seeing a pcp vs ED services. Bp today 120/83. Has been given Rx by ED PA for 30 days of metoprolol.  Pt provided with list of [place] self pay providers and encouraged to go to a self pay provider. When inquired why pt came to [facility] vs a self pay pcp in [place] pt states he has missed 4-5 appointments at [facility] in [place], [facility] [place] but they were closed and he did not know of other places to go.  Encouraged visit to DSS of [employer]. Pt offered information [facility], financial resources and DSS for [place]

## 2011-07-28 NOTE — ED Provider Notes (Signed)
Medical screening examination/treatment/procedure(s) were performed by non-physician practitioner and as supervising physician I was immediately available for consultation/collaboration.  Kaely Hollan, MD 07/28/11 0002 

## 2011-08-27 ENCOUNTER — Emergency Department: Payer: Self-pay | Admitting: Emergency Medicine

## 2011-08-27 LAB — CBC
HGB: 17.1 g/dL (ref 13.0–18.0)
MCV: 94 fL (ref 80–100)
Platelet: 96 10*3/uL — ABNORMAL LOW (ref 150–440)
RBC: 5.26 10*6/uL (ref 4.40–5.90)
RDW: 13 % (ref 11.5–14.5)
WBC: 7.4 10*3/uL (ref 3.8–10.6)

## 2011-08-27 LAB — BASIC METABOLIC PANEL
BUN: 17 mg/dL (ref 7–18)
Calcium, Total: 9.4 mg/dL (ref 8.5–10.1)
Chloride: 102 mmol/L (ref 98–107)
Creatinine: 0.96 mg/dL (ref 0.60–1.30)
EGFR (Non-African Amer.): >60
Glucose: 119 mg/dL — ABNORMAL HIGH (ref 65–99)
Osmolality: 286 (ref 275–301)
Potassium: 3.9 mmol/L (ref 3.5–5.1)
Sodium: 142 mmol/L (ref 136–145)

## 2011-08-27 LAB — TROPONIN I: Troponin-I: <0.02 ng/mL

## 2011-09-16 ENCOUNTER — Emergency Department: Payer: Self-pay | Admitting: Emergency Medicine

## 2011-10-04 ENCOUNTER — Emergency Department: Payer: Self-pay | Admitting: Emergency Medicine

## 2011-10-05 ENCOUNTER — Emergency Department: Payer: Self-pay | Admitting: *Deleted

## 2011-10-07 ENCOUNTER — Emergency Department: Payer: Self-pay | Admitting: *Deleted

## 2011-10-07 LAB — URINALYSIS, COMPLETE
Bacteria: NONE SEEN
Bilirubin,UR: NEGATIVE
Glucose,UR: 50 mg/dL (ref 0–75)
Ketone: NEGATIVE
Nitrite: NEGATIVE
RBC,UR: 353 /HPF (ref 0–5)
Squamous Epithelial: <1

## 2011-11-07 ENCOUNTER — Emergency Department: Payer: Self-pay | Admitting: Emergency Medicine

## 2011-12-08 ENCOUNTER — Emergency Department: Payer: Self-pay | Admitting: *Deleted

## 2012-03-01 ENCOUNTER — Emergency Department: Payer: Self-pay | Admitting: Emergency Medicine

## 2012-04-02 ENCOUNTER — Emergency Department: Payer: Self-pay | Admitting: Internal Medicine

## 2012-04-13 ENCOUNTER — Emergency Department: Payer: Self-pay | Admitting: Emergency Medicine

## 2012-04-13 LAB — COMPREHENSIVE METABOLIC PANEL
Albumin: 4.3 g/dL (ref 3.4–5.0)
Alkaline Phosphatase: 99 U/L (ref 50–136)
Anion Gap: 6 — ABNORMAL LOW (ref 7–16)
BUN: 13 mg/dL (ref 7–18)
Bilirubin,Total: 0.4 mg/dL (ref 0.2–1.0)
Chloride: 106 mmol/L (ref 98–107)
Creatinine: 0.96 mg/dL (ref 0.60–1.30)
Glucose: 185 mg/dL — ABNORMAL HIGH (ref 65–99)
Potassium: 4.8 mmol/L (ref 3.5–5.1)
SGOT(AST): 18 U/L (ref 15–37)
Total Protein: 7.6 g/dL (ref 6.4–8.2)

## 2012-04-13 LAB — CBC
HCT: 51.9 % (ref 40.0–52.0)
HGB: 18.3 g/dL — ABNORMAL HIGH (ref 13.0–18.0)
MCHC: 35.2 g/dL (ref 32.0–36.0)
MCV: 91 fL (ref 80–100)
RBC: 5.69 10*6/uL (ref 4.40–5.90)
WBC: 7.8 10*3/uL (ref 3.8–10.6)

## 2012-04-13 LAB — URINALYSIS, COMPLETE
Bacteria: NONE SEEN
Bilirubin,UR: NEGATIVE
Blood: NEGATIVE
Glucose,UR: NEGATIVE mg/dL (ref 0–75)
Leukocyte Esterase: NEGATIVE
Nitrite: NEGATIVE
Protein: NEGATIVE
RBC,UR: <1 /HPF (ref 0–5)
WBC UR: 1 /HPF (ref 0–5)

## 2012-05-06 ENCOUNTER — Emergency Department: Payer: Self-pay | Admitting: Emergency Medicine

## 2012-06-07 ENCOUNTER — Emergency Department: Payer: Self-pay | Admitting: Emergency Medicine

## 2012-07-09 ENCOUNTER — Emergency Department: Payer: Self-pay | Admitting: Emergency Medicine

## 2012-08-11 ENCOUNTER — Emergency Department (HOSPITAL_COMMUNITY)
Admission: EM | Admit: 2012-08-11 | Discharge: 2012-08-11 | Disposition: A | Discharge status: Home / Self Care | Payer: Self-pay | Attending: Emergency Medicine | Admitting: Emergency Medicine

## 2012-08-11 ENCOUNTER — Encounter (HOSPITAL_COMMUNITY): Payer: Self-pay | Admitting: *Deleted

## 2012-08-11 DIAGNOSIS — Z86718 Personal history of other venous thrombosis and embolism: Secondary | ICD-10-CM | POA: Insufficient documentation

## 2012-08-11 DIAGNOSIS — E119 Type 2 diabetes mellitus without complications: Secondary | ICD-10-CM | POA: Insufficient documentation

## 2012-08-11 DIAGNOSIS — Z760 Encounter for issue of repeat prescription: Secondary | ICD-10-CM | POA: Insufficient documentation

## 2012-08-11 DIAGNOSIS — F17200 Nicotine dependence, unspecified, uncomplicated: Secondary | ICD-10-CM | POA: Insufficient documentation

## 2012-08-11 DIAGNOSIS — I10 Essential (primary) hypertension: Secondary | ICD-10-CM | POA: Insufficient documentation

## 2012-08-11 MED ORDER — METFORMIN HCL 500 MG PO TABS: 500.0000 mg | ORAL_TABLET | Freq: Three times a day (TID) | ORAL | Status: DC

## 2012-08-11 MED ORDER — METFORMIN HCL 500 MG PO TABS
500.0000 mg | ORAL_TABLET | Freq: Once | ORAL | Status: AC
  Administered 2012-08-11: 500 mg via ORAL
  Filled 2012-08-11: qty 1

## 2012-08-11 MED ORDER — METOPROLOL TARTRATE 25 MG PO TABS
50.0000 mg | ORAL_TABLET | Freq: Once | ORAL | Status: AC
  Administered 2012-08-11: 50 mg via ORAL
  Filled 2012-08-11: qty 2

## 2012-08-11 MED ORDER — METOPROLOL TARTRATE 50 MG PO TABS: 50.0000 mg | ORAL_TABLET | Freq: Two times a day (BID) | ORAL | Status: DC

## 2012-08-11 NOTE — ED Provider Notes (Signed)
History     CSN: [id number]  Arrival date & time 08/11/12  1317   First MD Initiated Contact with Patient 08/11/12 1335      Chief Complaint  Patient presents with  . Medication Refill    (Consider location/radiation/quality/duration/timing/severity/associated sxs/prior treatment) HPI  [name] is a 52 y.o. male stenting for medication refill for metformin and metoprolol. Patient will ran out of his medications tonight. He is uninsured and has no primary care provider. He denies any chest pain, shortness of breath, headache, change in vision, dysarthria, ataxia, abdominal pain, nausea vomiting, change in bowel or bladder habits.  Past Medical History  Diagnosis Date  . Hypertension   . Diabetes mellitus   . DVT (deep venous thrombosis)     Past Surgical History  Procedure Laterality Date  . Appendectomy      History reviewed. No pertinent family history.  History  Substance Use Topics  . Smoking status: Current Every Day Smoker -- 1.00 packs/day    Types: Cigarettes  . Smokeless tobacco: Not on file  . Alcohol Use: No      Review of Systems  Constitutional: Negative for fever.  Respiratory: Negative for shortness of breath.   Cardiovascular: Negative for chest pain.  Gastrointestinal: Negative for nausea, vomiting, abdominal pain and diarrhea.  All other systems reviewed and are negative.    Allergies  Erythromycin base; Methocarbamol; and Sulfa antibiotics  Home Medications   Current Outpatient Rx  Name  Route  Sig  Dispense  Refill  . acetaminophen (TYLENOL) 500 MG tablet   Oral   Take 1,000 mg by mouth daily as needed. For pain.         . metFORMIN (GLUCOPHAGE) 500 MG tablet   Oral   Take 1 tablet (500 mg total) by mouth 3 (three) times daily with meals.   20 tablet   0   . metoprolol (LOPRESSOR) 50 MG tablet   Oral   Take 50 mg by mouth 2 (two) times daily.         . metFORMIN (GLUCOPHAGE) 500 MG tablet   Oral   Take 1 tablet (500  mg total) by mouth 3 (three) times daily.   90 tablet   2   . metoprolol (LOPRESSOR) 50 MG tablet   Oral   Take 1 tablet (50 mg total) by mouth 2 (two) times daily.   60 tablet   2     BP 128/89  Pulse 83  Temp(Src) 97.8 F (36.6 C) (Oral)  Resp 18  SpO2 98%  Physical Exam  Nursing note and vitals reviewed. Constitutional: He is oriented to person, place, and time. He appears well-developed and well-nourished. No distress.  HENT:  Head: Normocephalic.  Mouth/Throat: Oropharynx is clear and moist.  Eyes: Conjunctivae and EOM are normal. Pupils are equal, round, and reactive to light.  Neck: Normal range of motion.  Cardiovascular: Normal rate, regular rhythm and intact distal pulses.   Pulmonary/Chest: Effort normal and breath sounds normal. No stridor. No respiratory distress. He has no wheezes. He has no rales. He exhibits no tenderness.  Abdominal: Soft. Bowel sounds are normal. He exhibits no distension and no mass. There is no tenderness. There is no rebound and no guarding.  Musculoskeletal: Normal range of motion.  Neurological: He is alert and oriented to person, place, and time.  Psychiatric: He has a normal mood and affect.    ED Course  Procedures (including critical care time)  Labs Reviewed - No  data to display No results found.   1. Medication refill       MDM  Asymptomatic patient with no insurance or outpatient care presenting for medication refill.    Filed Vitals:   08/11/12 1330  BP: 128/89  Pulse: 83  Temp: 97.8 F (36.6 C)  TempSrc: Oral  Resp: 18  SpO2: 98%     Pt verbalized understanding and agrees with care plan. Outpatient follow-up and return precautions given.    Discharge Medication List as of 08/11/2012  1:48 PM    START taking these medications   Details  !! metFORMIN (GLUCOPHAGE) 500 MG tablet Take 1 tablet (500 mg total) by mouth 3 (three) times daily., Starting 08/11/2012, Until Discontinued, Print    !! metoprolol  (LOPRESSOR) 50 MG tablet Take 1 tablet (50 mg total) by mouth 2 (two) times daily., Starting 08/11/2012, Until Discontinued, Print     !! - Potential duplicate medications found. Please discuss with provider.              Wynetta Emery, PA-C 08/11/12 1437

## 2012-08-11 NOTE — ED Notes (Signed)
Pt to ED because he is about to run out of his DM and HTN medications. Pt reports he had been going to a clinic but just got Obamacare and now he doesn't have a place to go but is looking for a PCP. Pt in nad. Pt denies any issues.

## 2012-08-11 NOTE — ED Notes (Signed)
Pt here for refill on bp meds, will run out of it tonight and does not currently have a pcp. No distress noted at triage.

## 2012-08-11 NOTE — ED Provider Notes (Signed)
Medical screening examination/treatment/procedure(s) were performed by non-physician practitioner and as supervising physician I was immediately available for consultation/collaboration.  Finley Chevez T Shelley Cocke, MD 08/11/12 1507 

## 2012-11-16 ENCOUNTER — Emergency Department: Payer: Self-pay | Admitting: Emergency Medicine

## 2012-11-19 ENCOUNTER — Ambulatory Visit: Payer: Self-pay

## 2012-11-20 ENCOUNTER — Ambulatory Visit: Payer: Self-pay

## 2012-11-21 ENCOUNTER — Telehealth: Payer: Self-pay | Admitting: General Practice

## 2012-11-27 NOTE — Telephone Encounter (Signed)
Pt rescheduled for next week.

## 2012-12-05 ENCOUNTER — Ambulatory Visit: Payer: PRIVATE HEALTH INSURANCE | Attending: Family Medicine | Admitting: Family Medicine

## 2012-12-05 ENCOUNTER — Ambulatory Visit: Payer: Self-pay

## 2012-12-05 VITALS — BP 123/84 | HR 82 | Temp 98.8°F | Resp 18 | Ht 71.0 in | Wt 226.0 lb

## 2012-12-05 DIAGNOSIS — F17200 Nicotine dependence, unspecified, uncomplicated: Secondary | ICD-10-CM | POA: Insufficient documentation

## 2012-12-05 DIAGNOSIS — E119 Type 2 diabetes mellitus without complications: Secondary | ICD-10-CM | POA: Insufficient documentation

## 2012-12-05 DIAGNOSIS — M79671 Pain in right foot: Secondary | ICD-10-CM

## 2012-12-05 DIAGNOSIS — M79609 Pain in unspecified limb: Secondary | ICD-10-CM

## 2012-12-05 DIAGNOSIS — E785 Hyperlipidemia, unspecified: Secondary | ICD-10-CM | POA: Insufficient documentation

## 2012-12-05 DIAGNOSIS — I10 Essential (primary) hypertension: Secondary | ICD-10-CM | POA: Insufficient documentation

## 2012-12-05 DIAGNOSIS — E1140 Type 2 diabetes mellitus with diabetic neuropathy, unspecified: Secondary | ICD-10-CM | POA: Insufficient documentation

## 2012-12-05 DIAGNOSIS — E1149 Type 2 diabetes mellitus with other diabetic neurological complication: Secondary | ICD-10-CM

## 2012-12-05 DIAGNOSIS — Z87442 Personal history of urinary calculi: Secondary | ICD-10-CM

## 2012-12-05 DIAGNOSIS — K029 Dental caries, unspecified: Secondary | ICD-10-CM

## 2012-12-05 DIAGNOSIS — E1142 Type 2 diabetes mellitus with diabetic polyneuropathy: Secondary | ICD-10-CM

## 2012-12-05 LAB — LIPID PANEL
LDL Cholesterol: 82 mg/dL (ref 0–99)
Triglycerides: 351 mg/dL — ABNORMAL HIGH (ref <150)
VLDL: 70 mg/dL — ABNORMAL HIGH (ref 0–40)

## 2012-12-05 LAB — COMPREHENSIVE METABOLIC PANEL
ALT: 15 U/L (ref 0–53)
Alkaline Phosphatase: 84 U/L (ref 39–117)
Sodium: 138 mEq/L (ref 135–145)
Total Bilirubin: 0.4 mg/dL (ref 0.3–1.2)
Total Protein: 6.9 g/dL (ref 6.0–8.3)

## 2012-12-05 LAB — CBC
MCH: 32.1 pg (ref 26.0–34.0)
MCHC: 35.6 g/dL (ref 30.0–36.0)
Platelets: 79 10*3/uL — ABNORMAL LOW (ref 150–400)

## 2012-12-05 LAB — HEMOGLOBIN A1C: Mean Plasma Glucose: 206 mg/dL — ABNORMAL HIGH (ref <117)

## 2012-12-05 MED ORDER — METOPROLOL TARTRATE 50 MG PO TABS: 50.0000 mg | ORAL_TABLET | Freq: Two times a day (BID) | ORAL | Status: DC

## 2012-12-05 MED ORDER — METFORMIN HCL 500 MG PO TABS: 500.0000 mg | ORAL_TABLET | Freq: Three times a day (TID) | ORAL | Status: DC

## 2012-12-05 NOTE — Progress Notes (Signed)
Patient presents to establish diabetic care and hypertension management. States that he needs refills on metformin and metoprolol. Pt states that he has a place on his left 3rd toe that may need to be looked at.

## 2012-12-05 NOTE — Progress Notes (Signed)
Patient ID: [name], male   DOB: 30-Mar-1961, 52 y.o.   MRN: [id number]  CC: establish for HTN and DM   HPI: Pt says that she is having some pain in feet.  He has diabetic neuropathy in feet.  He has DM controlled with oral meds and hypertension.  Pt has been taking his meds for many years and has been stable.   He has a history of a DVT from years ago.   Pt says that he has taken crestor in the past but stopped taking it because of muscle cramps.   Allergies  Allergen Reactions  . Erythromycin Base Anxiety  . Methocarbamol Anxiety and Other (See Comments)    Stomach cramping, weakness  . Sulfa Antibiotics Nausea And Vomiting and Other (See Comments)    Cramping in stomach and hyperventilate.    Past Medical History  Diagnosis Date  . Hypertension   . Diabetes mellitus   . DVT (deep venous thrombosis)    No current outpatient prescriptions on file prior to visit.   No current facility-administered medications on file prior to visit.   Family History  Problem Relation Age of Onset  . Heart disease Mother   . Diabetes Father   . Heart disease Father    History   Social History  . Marital Status: Married    Spouse Name: N/A    Number of Children: N/A  . Years of Education: N/A   Occupational History  . Not on file.   Social History Main Topics  . Smoking status: Current Every Day Smoker -- 1.00 packs/day    Types: Cigarettes  . Smokeless tobacco: Not on file  . Alcohol Use: No  . Drug Use: No  . Sexually Active: Not on file   Other Topics Concern  . Not on file   Social History Narrative  . No narrative on file    Review of Systems  Constitutional: Negative for fever, chills, diaphoresis, activity change, appetite change and fatigue.  HENT: Negative for ear pain, nosebleeds, congestion, facial swelling, rhinorrhea, neck pain, neck stiffness and ear discharge.   Eyes: Negative for pain, discharge, redness, itching and visual disturbance.  Respiratory:  Negative for cough, choking, chest tightness, shortness of breath, wheezing and stridor.   Cardiovascular: Negative for chest pain, palpitations and leg swelling.  Gastrointestinal: Negative for abdominal distention.  Genitourinary: Negative for dysuria, urgency, frequency, hematuria, flank pain, decreased urine volume, difficulty urinating and dyspareunia.  Musculoskeletal: Negative for back pain, joint swelling, arthralgias and gait problem.  Neurological: Negative for dizziness, tremors, seizures, syncope, facial asymmetry, speech difficulty, weakness, light-headedness, numbness and headaches.  Hematological: Negative for adenopathy. Does not bruise/bleed easily.  Psychiatric/Behavioral: Negative for hallucinations, behavioral problems, confusion, dysphoric mood, decreased concentration and agitation.    Objective:   Filed Vitals:   12/05/12 1259  BP: 123/84  Pulse: 82  Temp: 98.8 F (37.1 C)  Resp: 18    Physical Exam  Constitutional: Appears well-developed and well-nourished. No distress.  HENT: Normocephalic. External right and left ear normal. Oropharynx is clear and moist.  Eyes: Conjunctivae and EOM are normal. PERRLA, no scleral icterus.  Neck: Normal ROM. Neck supple. No JVD. No tracheal deviation. No thyromegaly.  CVS: RRR, S1/S2 +, no murmurs, no gallops, no carotid bruit.  Pulmonary: Effort and breath sounds normal, no stridor, rhonchi, wheezes, rales.  Abdominal: Soft. BS +,  no distension, tenderness, rebound or guarding.  Musculoskeletal: Normal range of motion. No edema and no tenderness.  Lymphadenopathy: No lymphadenopathy noted, cervical, inguinal. Neuro: Alert. Normal reflexes, muscle tone coordination. No cranial nerve deficit. Skin: Skin is warm and dry. No rash noted. Not diaphoretic. No erythema. No pallor.  Psychiatric: Normal mood and affect. Behavior, judgment, thought content normal.   No results found for this basename: WBC, HGB, HCT, MCV, PLT   No  results found for this basename: CREATININE, BUN, NA, K, CL, CO2    No results found for this basename: HGBA1C   Lipid Panel  No results found for this basename: chol, trig, hdl, cholhdl, vldl, ldlcalc       Assessment and plan:   Patient Active Problem List   Diagnosis Date Noted  . Diabetic neuropathy 12/05/2012  . Smoker 12/05/2012  . Dental caries 12/05/2012  . Foot pain, bilateral 12/05/2012  . History of nephrolithiasis 12/05/2012  . Dyslipidemia 12/05/2012  . Diabetes mellitus 05/11/2011  . Hypertension 05/11/2011     Refilled his regular meds today for HTN and DM.     Check his labs today and follow lab results.    Diabetic foot exam today   Follow up in 3 months  The patient was given clear instructions to go to ER or return to medical center if symptoms don't improve, worsen or new problems develop.  The patient verbalized understanding.  The patient was told to call to get lab results if they haven't heard anything in the next week.    Rodney Langton, MD, CDE, FAAFP [facility] [place], [place]

## 2012-12-05 NOTE — Patient Instructions (Addendum)
A1c, Hemoglobin A1c The A1c (hemoglobin A1c, glycosylated hemoglobin) test checks the average amount of sugar (glucose) in the blood over the last 2 to 3 months. It does this by measuring the concentration of glycosylated hemoglobin. As glucose circulates in the blood, some of it binds to hemoglobin A. This is the main form of hemoglobin in adults. Hemoglobin is a red protein that carries oxygen in the red blood cells (RBCs). Once the glucose is bound to the hemoglobin A, it remains there for the life of the red blood cell (about 120 days). This combination of glucose and hemoglobin A is called A1c. Increased glucose in the blood, increases the hemoglobin A1c. A1c levels do not change quickly but will shift as RBCs are replaced. A1c is a valuable test because it enables you to know how your glucose has been controlled over the past 3 months.  5% A1c  Estimated Average Glucose mg/dL: 97  6% Z6X  Estimated Average Glucose mg/dL: 096  7% E4V  Estimated Average Glucose mg/dL: 409  8% W1X  Estimated Average Glucose mg/dL: 914  9% N8G  Estimated Average Glucose mg/dL: 956  21% H0Q  Estimated Average Glucose mg/dL: 657  84% O9G  Estimated Average Glucose mg/dL: 295  28% U1L  Estimated Average Glucose mg/dL: 244 The American Diabetes Association (ADA) recommends testing your A1c level 4 times each year if you have type 1 or type 2 diabetes and use insulin; or 2 times each year if you have type 2 diabetes and do not use insulin. When someone is first diagnosed with diabetes or if control is not good, A1c may be ordered more frequently. PREPARATION FOR TEST No preparation or fasting is necessary for this blood sample. NORMAL FINDINGS   Adults without diabetes: 2.2 to 4.8%  Children without diabetes: 1.8 to 4.0%  Good diabetic control: 2.5 to 5.9%  Fair diabetic control: 6 to 8%  Poor diabetic control: greater than 8% The values of A1c may be falsely low in pregnancy, in  disorders with shortened red blood cell lives, or in sickle cell disease or trait (carrier). The values may be falsely high in disorders with longer red cell lives, or in people with Thallassemia, kidney failure, or iron deficiency anemia. Ranges for normal findings may vary among different laboratories and hospitals. You should always check with your doctor after having lab work or other tests done to discuss the meaning of your test results and whether your values are considered within normal limits. MEANING OF TEST  Your caregiver will go over the test results with you and discuss the importance and meaning of your results, as well as treatment options and the need for additional tests if necessary. If your A1c is greater than 7%, discuss treatment options with your caregiver. OBTAINING THE TEST RESULTS  It is your responsibility to obtain your test results. Ask the lab or department performing the test when and how you will get your results. Document Released: 07/12/2004 Document Revised: 09/12/2011 Document Reviewed: 05/17/2012 [facility] Patient Information 2014 [place], [place]. Diabetes and Foot Care Diabetes may cause you to have a poor blood supply (circulation) to your legs and feet. Because of this, the skin may be thinner, break easier, and heal more slowly. You also may have nerve damage in your legs and feet causing decreased feeling. You may not notice minor injuries to your feet that could lead to serious problems or infections. Taking care of your feet is one of the most important things you  can do for yourself.  HOME CARE INSTRUCTIONS  Do not go barefoot. Bare feet are easily injured.  Check your feet daily for blisters, cuts, and redness.  Wash your feet with warm water (not hot) and mild soap. Pat your feet and between your toes until completely dry.  Apply a moisturizing lotion that does not contain alcohol or petroleum jelly to the dry skin on your feet and to dry brittle  toenails. Do not put it between your toes.  Trim your toenails straight across. Do not dig under them or around the cuticle.  Do not cut corns or calluses, or try to remove them with medicine.  Wear clean cotton socks or stockings every day. Make sure they are not too tight. Do not wear knee high stockings since they may decrease blood flow to your legs.  Wear leather shoes that fit properly and have enough cushioning. To break in new shoes, wear them just a few hours a day to avoid injuring your feet.  Wear shoes at all times, even in the house.  Do not cross your legs. This may decrease the blood flow to your feet.  If you find a minor scrape, cut, or break in the skin on your feet, keep it and the skin around it clean and dry. These areas may be cleansed with mild soap and water. Do not use peroxide, alcohol, iodine or Merthiolate.  When you remove an adhesive bandage, be sure not to harm the skin around it.  If you have a wound, look at it several times a day to make sure it is healing.  Do not use heating pads or hot water bottles. Burns can occur. If you have lost feeling in your feet or legs, you may not know it is happening until it is too late.  Report any cuts, sores or bruises to your caregiver. Do not wait! SEEK MEDICAL CARE IF:   You have an injury that is not healing or you notice redness, numbness, burning, or tingling.  Your feet always feel cold.  You have pain or cramps in your legs and feet. SEEK IMMEDIATE MEDICAL CARE IF:   There is increasing redness, swelling, or increasing pain in the wound.  There is a red line that goes up your leg.  Pus is coming from a wound.  You develop an unexplained oral temperature above 102 F (38.9 C), or as your caregiver suggests.  You notice a bad smell coming from an ulcer or wound. MAKE SURE YOU:   Understand these instructions.  Will watch your condition.  Will get help right away if you are not doing well or get  worse. Document Released: 06/17/2000 Document Revised: 09/12/2011 Document Reviewed: 12/24/2008 [facility] Patient Information 2014 [place], [place]. Hypertension As your heart beats, it forces blood through your arteries. This force is your blood pressure. If the pressure is too high, it is called hypertension (HTN) or high blood pressure. HTN is dangerous because you may have it and not know it. High blood pressure may mean that your heart has to work harder to pump blood. Your arteries may be narrow or stiff. The extra work puts you at risk for heart disease, stroke, and other problems.  Blood pressure consists of two numbers, a higher number over a lower, 110/72, for example. It is stated as "110 over 72." The ideal is below 120 for the top number (systolic) and under 80 for the bottom (diastolic). Write down your blood pressure today. You should  pay close attention to your blood pressure if you have certain conditions such as:  Heart failure.  Prior heart attack.  Diabetes  Chronic kidney disease.  Prior stroke.  Multiple risk factors for heart disease. To see if you have HTN, your blood pressure should be measured while you are seated with your arm held at the level of the heart. It should be measured at least twice. A one-time elevated blood pressure reading (especially in the Emergency Department) does not mean that you need treatment. There may be conditions in which the blood pressure is different between your right and left arms. It is important to see your caregiver soon for a recheck. Most people have essential hypertension which means that there is not a specific cause. This type of high blood pressure may be lowered by changing lifestyle factors such as:  Stress.  Smoking.  Lack of exercise.  Excessive weight.  Drug/tobacco/alcohol use.  Eating less salt. Most people do not have symptoms from high blood pressure until it has caused damage to the body. Effective treatment  can often prevent, delay or reduce that damage. TREATMENT  When a cause has been identified, treatment for high blood pressure is directed at the cause. There are a large number of medications to treat HTN. These fall into several categories, and your caregiver will help you select the medicines that are best for you. Medications may have side effects. You should review side effects with your caregiver. If your blood pressure stays high after you have made lifestyle changes or started on medicines,   Your medication(s) may need to be changed.  Other problems may need to be addressed.  Be certain you understand your prescriptions, and know how and when to take your medicine.  Be sure to follow up with your caregiver within the time frame advised (usually within two weeks) to have your blood pressure rechecked and to review your medications.  If you are taking more than one medicine to lower your blood pressure, make sure you know how and at what times they should be taken. Taking two medicines at the same time can result in blood pressure that is too low. SEEK IMMEDIATE MEDICAL CARE IF:  You develop a severe headache, blurred or changing vision, or confusion.  You have unusual weakness or numbness, or a faint feeling.  You have severe chest or abdominal pain, vomiting, or breathing problems. MAKE SURE YOU:   Understand these instructions.  Will watch your condition.  Will get help right away if you are not doing well or get worse. Document Released: 06/20/2005 Document Revised: 09/12/2011 Document Reviewed: 02/08/2008 [facility] Patient Information 2014 [place], [place]. Managing Your High Blood Pressure Blood pressure is a measurement of how forceful your blood is pressing against the walls of the arteries. Arteries are muscular tubes within the circulatory system. Blood pressure does not stay the same. Blood pressure rises when you are active, excited, or nervous; and it lowers during  sleep and relaxation. If the numbers measuring your blood pressure stay above normal most of the time, you are at risk for health problems. High blood pressure (hypertension) is a long-term (chronic) condition in which blood pressure is elevated. A blood pressure reading is recorded as two numbers, such as 120 over 80 (or 120/80). The first, higher number is called the systolic pressure. It is a measure of the pressure in your arteries as the heart beats. The second, lower number is called the diastolic pressure. It is a measure  of the pressure in your arteries as the heart relaxes between beats.  Keeping your blood pressure in a normal range is important to your overall health and prevention of health problems, such as heart disease and stroke. When your blood pressure is uncontrolled, your heart has to work harder than normal. High blood pressure is a very common condition in adults because blood pressure tends to rise with age. Men and women are equally likely to have hypertension but at different times in life. Before age 31, men are more likely to have hypertension. After 53 years of age, women are more likely to have it. Hypertension is especially common in African Americans. This condition often has no signs or symptoms. The cause of the condition is usually not known. Your caregiver can help you come up with a plan to keep your blood pressure in a normal, healthy range. BLOOD PRESSURE STAGES Blood pressure is classified into four stages: normal, prehypertension, stage 1, and stage 2. Your blood pressure reading will be used to determine what type of treatment, if any, is necessary. Appropriate treatment options are tied to these four stages:  Normal  Systolic pressure (mm Hg): below 120.  Diastolic pressure (mm Hg): below 80. Prehypertension  Systolic pressure (mm Hg): 120 to 139.  Diastolic pressure (mm Hg): 80 to 89. Stage1  Systolic pressure (mm Hg): 140 to 159.  Diastolic pressure (mm  Hg): 90 to 99. Stage2  Systolic pressure (mm Hg): 160 or above.  Diastolic pressure (mm Hg): 100 or above. RISKS RELATED TO HIGH BLOOD PRESSURE Managing your blood pressure is an important responsibility. Uncontrolled high blood pressure can lead to:  A heart attack.  A stroke.  A weakened blood vessel (aneurysm).  Heart failure.  Kidney damage.  Eye damage.  Metabolic syndrome.  Memory and concentration problems. HOW TO MANAGE YOUR BLOOD PRESSURE Blood pressure can be managed effectively with lifestyle changes and medicines (if needed). Your caregiver will help you come up with a plan to bring your blood pressure within a normal range. Your plan should include the following: Education  Read all information provided by your caregivers about how to control blood pressure.  Educate yourself on the latest guidelines and treatment recommendations. New research is always being done to further define the risks and treatments for high blood pressure. Lifestylechanges  Control your weight.  Avoid smoking.  Stay physically active.  Reduce the amount of salt in your diet.  Reduce stress.  Control any chronic conditions, such as high cholesterol or diabetes.  Reduce your alcohol intake. Medicines  Several medicines (antihypertensive medicines) are available, if needed, to bring blood pressure within a normal range. Communication  Review all the medicines you take with your caregiver because there may be side effects or interactions.  Talk with your caregiver about your diet, exercise habits, and other lifestyle factors that may be contributing to high blood pressure.  See your caregiver regularly. Your caregiver can help you create and adjust your plan for managing high blood pressure. RECOMMENDATIONS FOR TREATMENT AND FOLLOW-UP  The following recommendations are based on current guidelines for managing high blood pressure in nonpregnant adults. Use these  recommendations to identify the proper follow-up period or treatment option based on your blood pressure reading. You can discuss these options with your caregiver.  Systolic pressure of 120 to 139 or diastolic pressure of 80 to 89: Follow up with your caregiver as directed.  Systolic pressure of 140 to 160 or diastolic pressure of 90  to 100: Follow up with your caregiver within 2 months.  Systolic pressure above 160 or diastolic pressure above 100: Follow up with your caregiver within 1 month.  Systolic pressure above 180 or diastolic pressure above 110: Consider antihypertensive therapy; follow up with your caregiver within 1 week.  Systolic pressure above 200 or diastolic pressure above 120: Begin antihypertensive therapy; follow up with your caregiver within 1 week. Document Released: 03/14/2012 Document Reviewed: 03/14/2012 [facility] Patient Information 2014 [place], [place].

## 2012-12-06 ENCOUNTER — Encounter: Payer: Self-pay | Admitting: Family Medicine

## 2012-12-06 DIAGNOSIS — D696 Thrombocytopenia, unspecified: Secondary | ICD-10-CM | POA: Insufficient documentation

## 2012-12-06 LAB — TSH: TSH: 2.113 u[IU]/mL (ref 0.350–4.500)

## 2012-12-06 LAB — VITAMIN D 25 HYDROXY (VIT D DEFICIENCY, FRACTURES): Vit D, 25-Hydroxy: 33 ng/mL (ref 30–89)

## 2012-12-06 NOTE — Progress Notes (Signed)
Quick Note:  Please inform patient that his platelet count was low. We should recheck his CBC next month. His A1c is high at 8.8% which means that his diabetes is poorly controlled. He needs to work hard at getting his blood sugars better controlled and check his blood sugars at least once per day. Pt needs to really stop smoking.   Rodney Langton, MD, CDE, FAAFP [facility] [place], [place]   ______

## 2012-12-07 ENCOUNTER — Telehealth: Payer: Self-pay | Admitting: *Deleted

## 2012-12-07 NOTE — Telephone Encounter (Signed)
12/07/12 Spoke with patient regarding lab results platelet count was low, A1c 8.8% Instructed to work on diabetes management make better food choices and stop  Drinking sodas and stop smoking. apointment schedule for July for follow up. Patient verbalize and understand. P.Breindel Collier,RN

## 2012-12-14 ENCOUNTER — Emergency Department: Payer: Self-pay | Admitting: Emergency Medicine

## 2013-01-07 ENCOUNTER — Ambulatory Visit: Payer: Self-pay

## 2013-01-08 ENCOUNTER — Encounter: Payer: Self-pay | Admitting: Internal Medicine

## 2013-01-08 ENCOUNTER — Ambulatory Visit: Payer: Self-pay | Attending: Family Medicine | Admitting: Internal Medicine

## 2013-01-08 VITALS — BP 136/89 | HR 70 | Temp 98.0°F | Resp 18 | Ht 71.0 in | Wt 220.0 lb

## 2013-01-08 DIAGNOSIS — E119 Type 2 diabetes mellitus without complications: Secondary | ICD-10-CM | POA: Insufficient documentation

## 2013-01-08 DIAGNOSIS — E1142 Type 2 diabetes mellitus with diabetic polyneuropathy: Secondary | ICD-10-CM

## 2013-01-08 DIAGNOSIS — E1149 Type 2 diabetes mellitus with other diabetic neurological complication: Secondary | ICD-10-CM

## 2013-01-08 DIAGNOSIS — I10 Essential (primary) hypertension: Secondary | ICD-10-CM | POA: Insufficient documentation

## 2013-01-08 DIAGNOSIS — E1140 Type 2 diabetes mellitus with diabetic neuropathy, unspecified: Secondary | ICD-10-CM

## 2013-01-08 DIAGNOSIS — D696 Thrombocytopenia, unspecified: Secondary | ICD-10-CM

## 2013-01-08 DIAGNOSIS — E785 Hyperlipidemia, unspecified: Secondary | ICD-10-CM

## 2013-01-08 DIAGNOSIS — K047 Periapical abscess without sinus: Secondary | ICD-10-CM | POA: Insufficient documentation

## 2013-01-08 MED ORDER — AMPICILLIN 500 MG PO CAPS: 500.0000 mg | ORAL_CAPSULE | Freq: Four times a day (QID) | ORAL | Status: DC

## 2013-01-08 NOTE — Progress Notes (Signed)
Patient ID: [name], male   DOB: [birth year], 52 y.o.   MRN: [id number]  CC: Tooth abscess  HPI: 52 year old male with past medical history of diabetes and hypertension who presented to our clinic with tooth pain and possible abscess. Patient reports no fevers or chills but the pain and the right side of the face. No facial swelling. Patient reports that whenever he gets pain like this he would be put on ampicillin which would provide significant relief. No chest pain or palpitations. No blurry vision or headache.  Allergies  Allergen Reactions  . Erythromycin Base Anxiety  . Methocarbamol Anxiety and Other (See Comments)    Stomach cramping, weakness  . Sulfa Antibiotics Nausea And Vomiting and Other (See Comments)    Cramping in stomach and hyperventilate.    Past Medical History  Diagnosis Date  . Hypertension   . Diabetes mellitus   . DVT (deep venous thrombosis)    Current Outpatient Prescriptions on File Prior to Visit  Medication Sig Dispense Refill  . metFORMIN (GLUCOPHAGE) 500 MG tablet Take 1 tablet (500 mg total) by mouth 3 (three) times daily with meals.  90 tablet  4  . metoprolol (LOPRESSOR) 50 MG tablet Take 1 tablet (50 mg total) by mouth 2 (two) times daily.  60 tablet  3   No current facility-administered medications on file prior to visit.   Family History  Problem Relation Age of Onset  . Heart disease Mother   . Diabetes Father   . Heart disease Father    History   Social History  . Marital Status: Married    Spouse Name: N/A    Number of Children: N/A  . Years of Education: N/A   Occupational History  . Not on file.   Social History Main Topics  . Smoking status: Current Every Day Smoker -- 1.00 packs/day    Types: Cigarettes  . Smokeless tobacco: Not on file  . Alcohol Use: No  . Drug Use: No  . Sexually Active: Not on file   Other Topics Concern  . Not on file   Social History Narrative  . No narrative on file    Review of Systems   Constitutional: Negative for fever, chills, diaphoresis, activity change, appetite change and fatigue.  HENT: Positive for tooth abscess, no ear pain or congestion or facial swelling  Eyes: Negative for pain, discharge, redness, itching and visual disturbance.  Respiratory: Negative for cough, choking, chest tightness, shortness of breath, wheezing and stridor.   Cardiovascular: Negative for chest pain, palpitations and leg swelling.  Gastrointestinal: Negative for abdominal distention.  Genitourinary: Negative for dysuria, urgency, frequency, hematuria, flank pain, decreased urine volume, difficulty urinating and dyspareunia.  Musculoskeletal: Negative for back pain, joint swelling, arthralgias and gait problem.  Neurological: Negative for dizziness, tremors, seizures, syncope, facial asymmetry, speech difficulty, weakness, light-headedness, numbness and headaches.  Hematological: Negative for adenopathy. Does not bruise/bleed easily.  Psychiatric/Behavioral: Negative for hallucinations, behavioral problems, confusion, dysphoric mood, decreased concentration and agitation.    Objective:   Filed Vitals:   01/08/13 1304  BP: 136/89  Pulse: 70  Temp: 98 F (36.7 C)  Resp: 18    Physical Exam  Constitutional: Appears well-developed and well-nourished. No distress.  HENT: Normocephalic. External right and left ear normal. Oropharynx is clear and moist.  Eyes: Conjunctivae and EOM are normal. PERRLA, no scleral icterus.  Neck: Normal ROM. Neck supple. No JVD. No tracheal deviation. No thyromegaly.  CVS: RRR, S1/S2 +, no murmurs,  no gallops, no carotid bruit.  Pulmonary: Effort and breath sounds normal, no stridor, rhonchi, wheezes, rales.  Abdominal: Soft. BS +,  no distension, tenderness, rebound or guarding.  Musculoskeletal: Normal range of motion. No edema and no tenderness.  Lymphadenopathy: No lymphadenopathy noted, cervical, inguinal. Neuro: Alert. Normal reflexes, muscle tone  coordination. No cranial nerve deficit. Skin: Skin is warm and dry. No rash noted. Not diaphoretic. No erythema. No pallor.  Psychiatric: Normal mood and affect. Behavior, judgment, thought content normal.   Lab Results  Component Value Date   WBC 6.7 12/05/2012   HGB 17.8* 12/05/2012   HCT 50.0 12/05/2012   MCV 90.1 12/05/2012   PLT 79* 12/05/2012   Lab Results  Component Value Date   CREATININE 0.99 12/05/2012   BUN 16 12/05/2012   NA 138 12/05/2012   K 4.1 12/05/2012   CL 102 12/05/2012   CO2 27 12/05/2012    Lab Results  Component Value Date   HGBA1C 8.8* 12/05/2012   Lipid Panel     Component Value Date/Time   CHOL 181 12/05/2012 1317   TRIG 351* 12/05/2012 1317   HDL 29* 12/05/2012 1317   CHOLHDL 6.2 12/05/2012 1317   VLDL 70* 12/05/2012 1317   LDLCALC 82 12/05/2012 1317       Assessment and plan:   Patient Active Problem List   Diagnosis Date Noted  . Tooth abscess - Ampicillin 500 mg 4 times a day for 7 days  - Referral to dentist provided  12/05/2012  . Type II or unspecified type diabetes mellitus without mention of complication, uncontrolled - Continue metformin, encourage compliance - A1c in 06/14 8.8 indicating poor glycemic control 05/11/2011  . Hypertension - Continue metoprolol  - We have discussed target BP range - I have advised pt to check BP regularly and to call us back if the numbers are higher than 140/90 - discussed the importance of compliance with medical therapy and diet  05/11/2011   Manson Passey [facility] [phone]

## 2013-01-08 NOTE — Patient Instructions (Addendum)
Diabetes, Frequently Asked Questions  WHAT IS DIABETES?  Most of the food we eat is turned into glucose (sugar). Our bodies use it for energy. The pancreas makes a hormone called insulin. It helps glucose get into the cells of our bodies. When you have diabetes, your body either does not make enough insulin or cannot use its own insulin as well as it should. This causes sugars to build up in your blood.  WHAT ARE THE SYMPTOMS OF DIABETES?   Frequent urination.   Excessive thirst.   Unexplained weight loss.   Extreme hunger.   Blurred vision.   Tingling or numbness in hands or feet.   Feeling very tired much of the time.   Dry, itchy skin.   Sores that are slow to heal.   Yeast infections.  WHAT ARE THE TYPES OF DIABETES?  Type 1 Diabetes    About 10% of affected people have this type.   Usually occurs before the age of 30.   Usually occurs in thin to normal weight people.  Type 2 Diabetes   About 90% of affected people have this type.   Usually occurs after the age of 40.   Usually occurs in overweight people.   More likely to have:   A family history of diabetes.   A history of diabetes during pregnancy (gestational diabetes).   High blood pressure.   High cholesterol and triglycerides.  Gestational Diabetes   Occurs in about 4% of pregnancies.   Usually goes away after the baby is born.   More likely to occur in women with:   Family history of diabetes.   Previous gestational diabetes.   Obese.   Over 25 years old.  WHAT IS PRE-DIABETES?  Pre-diabetes means your blood glucose is higher than normal, but lower than the diabetes range. It also means you are at risk of getting type 2 diabetes and heart disease. If you are told you have pre-diabetes, have your blood glucose checked again in 1 to 2 years.  WHAT IS THE TREATMENT FOR DIABETES?   Treatment is aimed at keeping blood glucose near normal levels at all times. Learning how to manage this yourself is important in treating diabetes. Depending on the type of diabetes you have, your treatment will include one or more of the following:   Monitoring your blood glucose.   Meal planning.   Exercise.   Oral medicine (pills) or insulin.  CAN DIABETES BE PREVENTED?  With type 1 diabetes, prevention is more difficult, because the triggers that cause it are not yet known.  With type 2 diabetes, prevention is more likely, with lifestyle changes:   Maintain a healthy weight.   Eat healthy.   Exercise.  IS THERE A CURE FOR DIABETES?  No, there is no cure for diabetes. There is a lot of research going on that is looking for a cure, and progress is being made. Diabetes can be treated and controlled. People with diabetes can manage their diabetes and lead normal, active lives.  SHOULD I BE TESTED FOR DIABETES?  If you are at least 53 years old, you should be tested for diabetes. You should be tested again every 3 years. If you are 45 or older and overweight, you may want to get tested more often. If you are younger than 45, overweight, and have one or more of the following risk factors, you should be tested:   Family history of diabetes.   Inactive lifestyle.   High blood pressure.    WHAT ARE SOME OTHER SOURCES FOR INFORMATION ON DIABETES?  The following organizations may help in your search for more information on diabetes:  National Diabetes Education Program (NDEP)  Internet: http://www.ndep.nih.gov/resources  American Diabetes Association  Internet: http://www.diabetes.org   Juvenile Diabetes Foundation International  Internet: http://www.jdf.org  Document Released: 06/23/2003 Document Revised: 09/12/2011 Document Reviewed: 04/17/2009  ExitCare Patient Information 2014 [employer].

## 2013-01-08 NOTE — Progress Notes (Signed)
Pt is here today for f/u visit diabetes medication and c/o left side of mouth dental abscess with drainage. States he can taste drainage. Vss. Denies c/p or chills. C/o weakness. Also requesting thyroid level check

## 2013-01-10 ENCOUNTER — Other Ambulatory Visit: Payer: Self-pay

## 2013-02-04 ENCOUNTER — Telehealth: Payer: Self-pay | Admitting: Family Medicine

## 2013-02-04 NOTE — Telephone Encounter (Signed)
Pt will apply for orange card on Fri, 02/08/13.  Pt is wondering if there are any resources for dental clinics/fairs in the area, he says he needs dental referral soon and might not be able to wait until Sept for orange referral.  Please advise pt.

## 2013-02-04 NOTE — Telephone Encounter (Signed)
CSW attempted call.  Left message.

## 2013-02-08 ENCOUNTER — Ambulatory Visit: Payer: Self-pay

## 2013-03-07 ENCOUNTER — Ambulatory Visit: Payer: Self-pay | Admitting: Internal Medicine

## 2013-04-17 ENCOUNTER — Telehealth: Payer: Self-pay | Admitting: Internal Medicine

## 2013-04-17 MED ORDER — METOPROLOL TARTRATE 50 MG PO TABS: 50.0000 mg | ORAL_TABLET | Freq: Two times a day (BID) | ORAL | Status: DC

## 2013-04-17 NOTE — Telephone Encounter (Signed)
Pt. Called regarding a refill of his blood pressure medication, pt states that he only has 4 days left and would like a refill until his next appointment on 04/25/2013 @ 12:15, please contact pt

## 2013-04-17 NOTE — Telephone Encounter (Signed)
Pt prescribed 30 day supply of BP meds until next visit 04/25/13

## 2013-04-19 NOTE — Telephone Encounter (Signed)
Spoke with patient and informed him of below, he has picked up rx

## 2013-04-25 ENCOUNTER — Ambulatory Visit: Payer: Self-pay

## 2013-05-08 ENCOUNTER — Ambulatory Visit: Payer: Self-pay | Attending: Internal Medicine

## 2013-05-08 DIAGNOSIS — E1165 Type 2 diabetes mellitus with hyperglycemia: Secondary | ICD-10-CM

## 2013-05-08 LAB — GLUCOSE, POCT (MANUAL RESULT ENTRY): POC Glucose: 283 mg/dl — AB (ref 70–99)

## 2013-05-08 MED ORDER — METOPROLOL TARTRATE 50 MG PO TABS: 50.0000 mg | ORAL_TABLET | Freq: Two times a day (BID) | ORAL | Status: DC

## 2013-05-08 MED ORDER — METFORMIN HCL 500 MG PO TABS: 500.0000 mg | ORAL_TABLET | Freq: Three times a day (TID) | ORAL | Status: DC

## 2013-05-08 NOTE — Progress Notes (Unsigned)
  Subjective:    Patient ID: [name], male    DOB: October 20, 1960, 52 y.o.   MRN: [id number]  HPI    Review of Systems     Objective:   Physical Exam Pt instructed to continue taking prescribed Metformin and return next appt 05/16/13 for lab work and diabetes management       Assessment & Plan:  Pt here as walk in with c/o neuropathy lower extrem Need medication refill Metformin,Metoprolol Weakness noted,uncontrolled diabetes CBG- 283. a1c obtained Will discuss with provider Pt scheduled for office visit 05/16/13

## 2013-05-16 ENCOUNTER — Ambulatory Visit: Payer: Self-pay

## 2013-05-21 ENCOUNTER — Ambulatory Visit: Payer: Self-pay

## 2013-05-24 ENCOUNTER — Encounter: Payer: Self-pay | Admitting: Internal Medicine

## 2013-05-24 ENCOUNTER — Ambulatory Visit: Payer: Self-pay | Attending: Internal Medicine | Admitting: Internal Medicine

## 2013-05-24 ENCOUNTER — Telehealth: Payer: Self-pay | Admitting: Internal Medicine

## 2013-05-24 VITALS — BP 136/89 | HR 64 | Temp 98.1°F | Resp 16 | Ht 71.0 in | Wt 227.0 lb

## 2013-05-24 DIAGNOSIS — E1110 Type 2 diabetes mellitus with ketoacidosis without coma: Secondary | ICD-10-CM

## 2013-05-24 DIAGNOSIS — E781 Pure hyperglyceridemia: Secondary | ICD-10-CM | POA: Insufficient documentation

## 2013-05-24 DIAGNOSIS — E1310 Other specified diabetes mellitus with ketoacidosis without coma: Secondary | ICD-10-CM

## 2013-05-24 DIAGNOSIS — E119 Type 2 diabetes mellitus without complications: Secondary | ICD-10-CM | POA: Insufficient documentation

## 2013-05-24 DIAGNOSIS — E559 Vitamin D deficiency, unspecified: Secondary | ICD-10-CM | POA: Insufficient documentation

## 2013-05-24 LAB — COMPLETE METABOLIC PANEL WITH GFR
ALT: 17 U/L (ref 0–53)
AST: 12 U/L (ref 0–37)
Alkaline Phosphatase: 77 U/L (ref 39–117)
Calcium: 9.8 mg/dL (ref 8.4–10.5)
Chloride: 99 mEq/L (ref 96–112)
Creat: 0.8 mg/dL (ref 0.50–1.35)

## 2013-05-24 LAB — GLUCOSE, POCT (MANUAL RESULT ENTRY): POC Glucose: 256 mg/dl — AB (ref 70–99)

## 2013-05-24 LAB — GLUCOSE, RANDOM: Glucose, Bld: 229 mg/dL — ABNORMAL HIGH (ref 70–99)

## 2013-05-24 MED ORDER — FREESTYLE SYSTEM KIT: 1.0000 | PACK | Status: DC | PRN

## 2013-05-24 MED ORDER — METOPROLOL TARTRATE 50 MG PO TABS: 50.0000 mg | ORAL_TABLET | Freq: Two times a day (BID) | ORAL | Status: DC

## 2013-05-24 MED ORDER — METFORMIN HCL 1000 MG PO TABS: 1000.0000 mg | ORAL_TABLET | Freq: Two times a day (BID) | ORAL | Status: DC

## 2013-05-24 MED ORDER — VITAMIN D (ERGOCALCIFEROL) 1.25 MG (50000 UNIT) PO CAPS: 50000.0000 [IU] | ORAL_CAPSULE | ORAL | Status: DC

## 2013-05-24 MED ORDER — FENOFIBRATE 48 MG PO TABS: 48.0000 mg | ORAL_TABLET | Freq: Every day | ORAL | Status: DC

## 2013-05-24 NOTE — Telephone Encounter (Addendum)
Pt was seen today and says that he needed a refill for metoprolol (LOPRESSOR) 50 MG tablet but when he went to [facility] in [place] on [address]  they say it wasn't called in.  [name] alt number is [phone].

## 2013-05-24 NOTE — Progress Notes (Signed)
Pt is here to follow up on his diabetes and HTN. Pt needs a glucometer.

## 2013-05-24 NOTE — Progress Notes (Signed)
Patient ID: [name], male   DOB: Nov 05, 1960, 52 y.o.   MRN: [id number]   CC:  HPI: 52 year old male with a history of type 2 diabetes, poorly compliant with Accu-Cheks. Currently under psychosocial stressors including undergoing separation with his wife, poor dietary compliance, who presents for a diabetes check. Last A1c 2 weeks ago was 7.6 CBG was 283 He denies any chest pain any shortness of breath His father died at 91 from a massive heart attack He denies any cardiopulmonary symptoms today     Allergies  Allergen Reactions  . Erythromycin Base Anxiety  . Methocarbamol Anxiety and Other (See Comments)    Stomach cramping, weakness  . Sulfa Antibiotics Nausea And Vomiting and Other (See Comments)    Cramping in stomach and hyperventilate.    Past Medical History  Diagnosis Date  . Hypertension   . Diabetes mellitus   . DVT (deep venous thrombosis)    Current Outpatient Prescriptions on File Prior to Visit  Medication Sig Dispense Refill  . metoprolol (LOPRESSOR) 50 MG tablet Take 1 tablet (50 mg total) by mouth 2 (two) times daily.  30 tablet  0  . ampicillin (PRINCIPEN) 500 MG capsule Take 1 capsule (500 mg total) by mouth 4 (four) times daily.  28 capsule  0   No current facility-administered medications on file prior to visit.   Family History  Problem Relation Age of Onset  . Heart disease Mother   . Diabetes Father   . Heart disease Father    History   Social History  . Marital Status: Married    Spouse Name: N/A    Number of Children: N/A  . Years of Education: N/A   Occupational History  . Not on file.   Social History Main Topics  . Smoking status: Current Every Day Smoker -- 1.00 packs/day    Types: Cigarettes  . Smokeless tobacco: Not on file  . Alcohol Use: No  . Drug Use: No  . Sexual Activity: Not on file   Other Topics Concern  . Not on file   Social History Narrative  . No narrative on file    Review of Systems   Constitutional: Negative for fever, chills, diaphoresis, activity change, appetite change and fatigue.  HENT: Negative for ear pain, nosebleeds, congestion, facial swelling, rhinorrhea, neck pain, neck stiffness and ear discharge.   Eyes: Negative for pain, discharge, redness, itching and visual disturbance.  Respiratory: Negative for cough, choking, chest tightness, shortness of breath, wheezing and stridor.   Cardiovascular: Negative for chest pain, palpitations and leg swelling.  Gastrointestinal: Negative for abdominal distention.  Genitourinary: Negative for dysuria, urgency, frequency, hematuria, flank pain, decreased urine volume, difficulty urinating and dyspareunia.  Musculoskeletal: Negative for back pain, joint swelling, arthralgias and gait problem.  Neurological: Negative for dizziness, tremors, seizures, syncope, facial asymmetry, speech difficulty, weakness, light-headedness, numbness and headaches.  Hematological: Negative for adenopathy. Does not bruise/bleed easily.  Psychiatric/Behavioral: Negative for hallucinations, behavioral problems, confusion, dysphoric mood, decreased concentration and agitation.    Objective:   Filed Vitals:   05/24/13 1145  BP: 136/89  Pulse: 64  Temp: 98.1 F (36.7 C)  Resp: 16    Physical Exam  Constitutional: Appears well-developed and well-nourished. No distress.  HENT: Normocephalic. External right and left ear normal. Oropharynx is clear and moist.  Eyes: Conjunctivae and EOM are normal. PERRLA, no scleral icterus.  Neck: Normal ROM. Neck supple. No JVD. No tracheal deviation. No thyromegaly.  CVS: RRR, S1/S2 +,  no murmurs, no gallops, no carotid bruit.  Pulmonary: Effort and breath sounds normal, no stridor, rhonchi, wheezes, rales.  Abdominal: Soft. BS +,  no distension, tenderness, rebound or guarding.  Musculoskeletal: Normal range of motion. No edema and no tenderness.  Lymphadenopathy: No lymphadenopathy noted, cervical,  inguinal. Neuro: Alert. Normal reflexes, muscle tone coordination. No cranial nerve deficit. Skin: Skin is warm and dry. No rash noted. Not diaphoretic. No erythema. No pallor.  Psychiatric: Normal mood and affect. Behavior, judgment, thought content normal.   Lab Results  Component Value Date   WBC 6.7 12/05/2012   HGB 17.8* 12/05/2012   HCT 50.0 12/05/2012   MCV 90.1 12/05/2012   PLT 79* 12/05/2012   Lab Results  Component Value Date   CREATININE 0.99 12/05/2012   BUN 16 12/05/2012   NA 138 12/05/2012   K 4.1 12/05/2012   CL 102 12/05/2012   CO2 27 12/05/2012    Lab Results  Component Value Date   HGBA1C 7.6 05/08/2013   Lipid Panel     Component Value Date/Time   CHOL 181 12/05/2012 1317   TRIG 351* 12/05/2012 1317   HDL 29* 12/05/2012 1317   CHOLHDL 6.2 12/05/2012 1317   VLDL 70* 12/05/2012 1317   LDLCALC 82 12/05/2012 1317       Assessment and plan:   Patient Active Problem List   Diagnosis Date Noted  . Thrombocytopenia, unspecified 12/06/2012  . Diabetic neuropathy 12/05/2012  . Smoker 12/05/2012  . Dyslipidemia 12/05/2012  . Type II or unspecified type diabetes mellitus without mention of complication, uncontrolled 05/11/2011  . Hypertension 05/11/2011       Diabetes mellitus Provided glucometer Increase metformin to thousand milligrams twice a day Patient asked to come back in 3 weeks If still uncontrolled the patient would need to be started on glipizide in addition to metformin  Hypertriglyceridemia Started TriCor 48 mg a day Repeat lipid panel and followup visit  Vitamin D deficiency last vitamin D. level was 33 Prescription sent to [facility]  The patient was given clear instructions to go to ER or return to medical center if symptoms don't improve, worsen or new problems develop. The patient verbalized understanding. The patient was told to call to get any lab results if not heard anything in the next week.

## 2013-05-28 ENCOUNTER — Telehealth: Payer: Self-pay | Admitting: Family Medicine

## 2013-05-28 NOTE — Telephone Encounter (Signed)
Left message for pt to call clinic back.

## 2013-05-28 NOTE — Telephone Encounter (Signed)
Pt has question about medication. Please f/u with pt.

## 2013-05-29 ENCOUNTER — Other Ambulatory Visit: Payer: Self-pay | Admitting: Emergency Medicine

## 2013-05-29 ENCOUNTER — Telehealth: Payer: Self-pay | Admitting: Emergency Medicine

## 2013-05-29 MED ORDER — GEMFIBROZIL 600 MG PO TABS: 600.0000 mg | ORAL_TABLET | Freq: Two times a day (BID) | ORAL | Status: DC

## 2013-05-29 MED ORDER — RELION PRIME MONITOR DEVI: Status: DC

## 2013-05-29 NOTE — Telephone Encounter (Signed)
[facility] pharmacy called in requesting medication changes on Fenofibrate Glucose monitoring kit due to pt cost. Dr. Susie Cassette ok'd changes

## 2013-06-06 ENCOUNTER — Emergency Department: Payer: Self-pay | Admitting: Internal Medicine

## 2013-06-10 ENCOUNTER — Telehealth: Payer: Self-pay | Admitting: Internal Medicine

## 2013-06-10 ENCOUNTER — Other Ambulatory Visit: Payer: Self-pay | Admitting: Emergency Medicine

## 2013-06-10 MED ORDER — METOPROLOL TARTRATE 50 MG PO TABS: 50.0000 mg | ORAL_TABLET | Freq: Two times a day (BID) | ORAL | Status: DC

## 2013-06-10 NOTE — Telephone Encounter (Signed)
Spoke with pt regarding script refill Metoprolol 50 mg tab. Medication scribed to pharmacy

## 2013-06-10 NOTE — Telephone Encounter (Signed)
Pt called regarding a refill of his metoprolol (LOPRESSOR) 50 MG, pt would like it sent to the [facility] in [place]. Please contact pt

## 2013-06-10 NOTE — Addendum Note (Signed)
Addended by: Nonnie Done D on: 06/10/2013 05:32 PM   Modules accepted: Orders

## 2013-06-10 NOTE — Telephone Encounter (Signed)
Pt was informed that the nurses are aware of his medication and will be addressing the issue later this afternoon.

## 2013-06-14 ENCOUNTER — Other Ambulatory Visit: Payer: Self-pay

## 2013-06-24 ENCOUNTER — Ambulatory Visit: Payer: Self-pay

## 2013-07-16 ENCOUNTER — Telehealth: Payer: Self-pay

## 2013-07-16 MED ORDER — METOPROLOL TARTRATE 50 MG PO TABS: 50.0000 mg | ORAL_TABLET | Freq: Two times a day (BID) | ORAL | Status: DC

## 2013-07-16 NOTE — Telephone Encounter (Signed)
Patient called needs medication refill on metoprolol And metformin Prescription sent to [facility] in [place]

## 2013-08-12 ENCOUNTER — Other Ambulatory Visit: Payer: Self-pay | Admitting: Emergency Medicine

## 2013-08-12 MED ORDER — METOPROLOL TARTRATE 50 MG PO TABS: 50.0000 mg | ORAL_TABLET | Freq: Two times a day (BID) | ORAL | Status: DC

## 2013-08-14 ENCOUNTER — Emergency Department: Payer: Self-pay | Admitting: Emergency Medicine

## 2013-08-14 LAB — CBC WITH DIFFERENTIAL/PLATELET
BASOS PCT: 1 %
Basophil #: 0.1 10*3/uL (ref 0.0–0.1)
EOS PCT: 5.1 %
Eosinophil #: 0.5 10*3/uL (ref 0.0–0.7)
HCT: 51.1 % (ref 40.0–52.0)
HGB: 17.8 g/dL (ref 13.0–18.0)
LYMPHS ABS: 2.1 10*3/uL (ref 1.0–3.6)
Lymphocyte %: 21.6 %
MCH: 32.5 pg (ref 26.0–34.0)
MCHC: 34.8 g/dL (ref 32.0–36.0)
MCV: 94 fL (ref 80–100)
Monocyte #: 0.6 x10 3/mm (ref 0.2–1.0)
Monocyte %: 6.5 %
Neutrophil #: 6.2 10*3/uL (ref 1.4–6.5)
Neutrophil %: 65.8 %
Platelet: 98 10*3/uL — ABNORMAL LOW (ref 150–440)
RBC: 5.46 10*6/uL (ref 4.40–5.90)
RDW: 12.9 % (ref 11.5–14.5)
WBC: 9.5 10*3/uL (ref 3.8–10.6)

## 2013-08-14 LAB — COMPREHENSIVE METABOLIC PANEL
ALK PHOS: 100 U/L
AST: 20 U/L (ref 15–37)
Albumin: 4.2 g/dL (ref 3.4–5.0)
Anion Gap: 0 — ABNORMAL LOW (ref 7–16)
BILIRUBIN TOTAL: 0.4 mg/dL (ref 0.2–1.0)
BUN: 11 mg/dL (ref 7–18)
CALCIUM: 9.3 mg/dL (ref 8.5–10.1)
CO2: 32 mmol/L (ref 21–32)
CREATININE: 0.85 mg/dL (ref 0.60–1.30)
Chloride: 103 mmol/L (ref 98–107)
EGFR (Non-African Amer.): >60
Glucose: 116 mg/dL — ABNORMAL HIGH (ref 65–99)
Osmolality: 270 (ref 275–301)
POTASSIUM: 3.8 mmol/L (ref 3.5–5.1)
SGPT (ALT): 25 U/L (ref 12–78)
SODIUM: 135 mmol/L — AB (ref 136–145)
TOTAL PROTEIN: 7.8 g/dL (ref 6.4–8.2)

## 2013-08-14 LAB — TROPONIN I

## 2013-08-19 ENCOUNTER — Ambulatory Visit: Payer: Self-pay | Admitting: Internal Medicine

## 2013-08-27 ENCOUNTER — Ambulatory Visit: Payer: Self-pay | Admitting: Internal Medicine

## 2013-09-18 ENCOUNTER — Emergency Department: Payer: Self-pay | Admitting: Emergency Medicine

## 2014-01-23 ENCOUNTER — Other Ambulatory Visit: Payer: Self-pay | Admitting: Internal Medicine

## 2014-01-23 DIAGNOSIS — I10 Essential (primary) hypertension: Secondary | ICD-10-CM

## 2014-02-25 ENCOUNTER — Emergency Department: Payer: Self-pay | Admitting: Emergency Medicine

## 2014-02-27 ENCOUNTER — Telehealth: Payer: Self-pay | Admitting: Family Medicine

## 2014-02-27 ENCOUNTER — Telehealth: Payer: Self-pay | Admitting: Internal Medicine

## 2014-02-27 ENCOUNTER — Telehealth: Payer: Self-pay

## 2014-02-27 ENCOUNTER — Telehealth: Payer: Self-pay | Admitting: *Deleted

## 2014-02-27 DIAGNOSIS — I10 Essential (primary) hypertension: Secondary | ICD-10-CM

## 2014-02-27 MED ORDER — METOPROLOL TARTRATE 50 MG PO TABS: 50.0000 mg | ORAL_TABLET | Freq: Two times a day (BID) | ORAL | Status: DC

## 2014-02-27 NOTE — Telephone Encounter (Signed)
Pt scheduled appt on 03/06/14 but will be out of meds in a couple of days,  Pt requesting refill. Pt uses [facility] in [place] on  [address], [place]: [phone].

## 2014-02-27 NOTE — Telephone Encounter (Signed)
Pt. Calling nurse back. Please f/u with pt. At [phone]

## 2014-02-27 NOTE — Telephone Encounter (Signed)
Wrong number provided.

## 2014-03-06 ENCOUNTER — Ambulatory Visit: Payer: Self-pay | Admitting: Internal Medicine

## 2014-03-07 ENCOUNTER — Telehealth: Payer: Self-pay | Admitting: Internal Medicine

## 2014-03-07 ENCOUNTER — Other Ambulatory Visit: Payer: Self-pay | Admitting: Internal Medicine

## 2014-03-07 NOTE — Telephone Encounter (Signed)
Pt. Has an appt. Scheduled for 9/10....pt. Has ran out of Metoprolol and metformin could he get a temporary refill until he comes in for his appt....Marland KitchenMarland Kitchenplease call patient..... Pt.'s pharmacy of choice is Pharmacy: [id number] - [place] ([address]), [place] - [address]

## 2014-03-09 ENCOUNTER — Emergency Department: Payer: Self-pay | Admitting: Emergency Medicine

## 2014-03-11 ENCOUNTER — Telehealth: Payer: Self-pay | Admitting: Emergency Medicine

## 2014-03-11 MED ORDER — METFORMIN HCL 1000 MG PO TABS: 1000.0000 mg | ORAL_TABLET | Freq: Two times a day (BID) | ORAL | Status: DC

## 2014-03-11 NOTE — Telephone Encounter (Signed)
Returned pt message in regards to medication refills on- Metformin and Metoprolol Blood pressure reordered #12 until seen by provider  E-scribed to listed pharmacy

## 2014-03-12 ENCOUNTER — Emergency Department (INDEPENDENT_AMBULATORY_CARE_PROVIDER_SITE_OTHER)
Admission: EM | Admit: 2014-03-12 | Discharge: 2014-03-12 | Disposition: A | Discharge status: Home / Self Care | Payer: Self-pay | Source: Home / Self Care

## 2014-03-12 ENCOUNTER — Encounter (HOSPITAL_COMMUNITY): Payer: Self-pay | Admitting: Emergency Medicine

## 2014-03-12 DIAGNOSIS — F17200 Nicotine dependence, unspecified, uncomplicated: Secondary | ICD-10-CM

## 2014-03-12 DIAGNOSIS — I10 Essential (primary) hypertension: Secondary | ICD-10-CM

## 2014-03-12 DIAGNOSIS — E119 Type 2 diabetes mellitus without complications: Secondary | ICD-10-CM

## 2014-03-12 DIAGNOSIS — Z720 Tobacco use: Secondary | ICD-10-CM

## 2014-03-12 MED ORDER — METFORMIN HCL 500 MG PO TABS: 500.0000 mg | ORAL_TABLET | Freq: Two times a day (BID) | ORAL | Status: DC

## 2014-03-12 MED ORDER — METOPROLOL TARTRATE 50 MG PO TABS: 50.0000 mg | ORAL_TABLET | Freq: Two times a day (BID) | ORAL | Status: DC

## 2014-03-12 NOTE — ED Provider Notes (Signed)
CSN: [id number]     Arrival date & time 03/12/14  1234 History   First MD Initiated Contact with Patient 03/12/14 1423     Chief Complaint  Patient presents with  . Medication Refill   (Consider location/radiation/quality/duration/timing/severity/associated sxs/prior Treatment) HPI Comments: 53 year old male with history of hypertension, diabetes and DVT presents to the urgent care for refills of metoprolol and metformin. He states he is taking his blood sugars at least once a day and recalls blood sugars between 120 and 140 in the past 3 days. Denies chest heaviness tightness, fullness or pressure, shortness of breath. Denies abdominal pain, other GI or GU symptoms. He has an appointment with the wellness clinic next Thursday which will be one week.   Past Medical History  Diagnosis Date  . Hypertension   . Diabetes mellitus   . DVT (deep venous thrombosis)    Past Surgical History  Procedure Laterality Date  . Appendectomy    . Vein ligation and stripping      of left leg   Family History  Problem Relation Age of Onset  . Heart disease Mother   . Diabetes Father   . Heart disease Father    History  Substance Use Topics  . Smoking status: Current Every Day Smoker -- 1.00 packs/day    Types: Cigarettes  . Smokeless tobacco: Not on file  . Alcohol Use: No    Review of Systems  Constitutional: Negative.   HENT: Negative.   Respiratory: Negative.   Cardiovascular: Negative.   Gastrointestinal: Negative.   Musculoskeletal: Negative for myalgias.  Neurological: Negative.  Negative for syncope, speech difficulty and headaches.    Allergies  Erythromycin base; Methocarbamol; and Sulfa antibiotics  Home Medications   Prior to Admission medications   Medication Sig Start Date End Date Taking? Authorizing Provider  metFORMIN (GLUCOPHAGE) 1000 MG tablet Take 1 tablet (1,000 mg total) by mouth 2 (two) times daily with a meal. 03/11/14  Yes Ambrose Finland, NP  metoprolol  (LOPRESSOR) 50 MG tablet Take 1 tablet (50 mg total) by mouth 2 (two) times daily. 02/27/14  Yes Doris Cheadle, MD  ampicillin (PRINCIPEN) 500 MG capsule Take 1 capsule (500 mg total) by mouth 4 (four) times daily. 01/08/13   Alison Murray, MD  Blood Glucose Monitoring Suppl (RELION PRIME MONITOR) DEVI Use as instructed by physician 05/29/13   Richarda Overlie, MD  gemfibrozil (LOPID) 600 MG tablet Take 1 tablet (600 mg total) by mouth 2 (two) times daily before a meal. 05/29/13   Richarda Overlie, MD  metFORMIN (GLUCOPHAGE) 500 MG tablet Take 1 tablet (500 mg total) by mouth 2 (two) times daily with a meal. 03/12/14   Hayden Rasmussen, NP  metoprolol tartrate (LOPRESSOR) 50 MG tablet Take 1 tablet (50 mg total) by mouth 2 (two) times daily. 03/12/14   Hayden Rasmussen, NP  Vitamin D, Ergocalciferol, (DRISDOL) 50000 UNITS CAPS capsule Take 1 capsule (50,000 Units total) by mouth every 7 (seven) days. 05/24/13   Richarda Overlie, MD   BP 140/95  Pulse 84  Temp(Src) 97 F (36.1 C) (Oral)  Resp 16  SpO2 96% Physical Exam  Nursing note and vitals reviewed. Constitutional: He is oriented to person, place, and time. He appears well-developed and well-nourished. No distress.  Eyes: Conjunctivae and EOM are normal.  Neck: Normal range of motion. Neck supple.  Cardiovascular: Normal rate, regular rhythm and normal heart sounds.   Pulmonary/Chest: Effort normal. He has no wheezes. He has no rales.  Diffuse  coarseness with fair air movement. Rhonchi in the left lung fields.  Musculoskeletal: He exhibits no edema.  Lymphadenopathy:    He has no cervical adenopathy.  Neurological: He is alert and oriented to person, place, and time. He exhibits normal muscle tone.  Skin: Skin is warm and dry.  Psychiatric: He has a normal mood and affect.    ED Course  Procedures (including critical care time) Labs Review Labs Reviewed - No data to display  Imaging Review No results found.   MDM   1. Type 2 diabetes mellitus without  complication   2. Essential hypertension   3. Tobacco abuse disorder     Metformin 500 bid Metoprolol 50 mg bid Stop smoking Do not miss your appt next week.    Hayden Rasmussen, NP 03/12/14 1446

## 2014-03-12 NOTE — Discharge Instructions (Signed)
Hypertension Hypertension is another name for high blood pressure. High blood pressure forces your heart to work harder to pump blood. A blood pressure reading has two numbers, which includes a higher number over a lower number (example: 110/72). HOME CARE   Have your blood pressure rechecked by your doctor.  Only take medicine as told by your doctor. Follow the directions carefully. The medicine does not work as well if you skip doses. Skipping doses also puts you at risk for problems.  Do not smoke.  Monitor your blood pressure at home as told by your doctor. GET HELP IF:  You think you are having a reaction to the medicine you are taking.  You have repeat headaches or feel dizzy.  You have puffiness (swelling) in your ankles.  You have trouble with your vision. GET HELP RIGHT AWAY IF:   You get a very bad headache and are confused.  You feel weak, numb, or faint.  You get chest or belly (abdominal) pain.  You throw up (vomit).  You cannot breathe very well. MAKE SURE YOU:   Understand these instructions.  Will watch your condition.  Will get help right away if you are not doing well or get worse. Document Released: 12/07/2007 Document Revised: 06/25/2013 Document Reviewed: 04/12/2013 [facility] Patient Information 2015 [place], [place]. This information is not intended to replace advice given to you by your health care provider. Make sure you discuss any questions you have with your health care provider.  Managing Your High Blood Pressure Blood pressure is a measurement of how forceful your blood is pressing against the walls of the arteries. Arteries are muscular tubes within the circulatory system. Blood pressure does not stay the same. Blood pressure rises when you are active, excited, or nervous; and it lowers during sleep and relaxation. If the numbers measuring your blood pressure stay above normal most of the time, you are at risk for health problems. High blood  pressure (hypertension) is a long-term (chronic) condition in which blood pressure is elevated. A blood pressure reading is recorded as two numbers, such as 120 over 80 (or 120/80). The first, higher number is called the systolic pressure. It is a measure of the pressure in your arteries as the heart beats. The second, lower number is called the diastolic pressure. It is a measure of the pressure in your arteries as the heart relaxes between beats.  Keeping your blood pressure in a normal range is important to your overall health and prevention of health problems, such as heart disease and stroke. When your blood pressure is uncontrolled, your heart has to work harder than normal. High blood pressure is a very common condition in adults because blood pressure tends to rise with age. Men and women are equally likely to have hypertension but at different times in life. Before age 35, men are more likely to have hypertension. After 54 years of age, women are more likely to have it. Hypertension is especially common in African Americans. This condition often has no signs or symptoms. The cause of the condition is usually not known. Your caregiver can help you come up with a plan to keep your blood pressure in a normal, healthy range. BLOOD PRESSURE STAGES Blood pressure is classified into four stages: normal, prehypertension, stage 1, and stage 2. Your blood pressure reading will be used to determine what type of treatment, if any, is necessary. Appropriate treatment options are tied to these four stages:  Normal  Systolic pressure (mm Hg):  below 120.  Diastolic pressure (mm Hg): below 80. Prehypertension  Systolic pressure (mm Hg): 120 to 139.  Diastolic pressure (mm Hg): 80 to 89. Stage1  Systolic pressure (mm Hg): 140 to 159.  Diastolic pressure (mm Hg): 90 to 99. Stage2  Systolic pressure (mm Hg): 160 or above.  Diastolic pressure (mm Hg): 100 or above. RISKS RELATED TO HIGH BLOOD  PRESSURE Managing your blood pressure is an important responsibility. Uncontrolled high blood pressure can lead to:  A heart attack.  A stroke.  A weakened blood vessel (aneurysm).  Heart failure.  Kidney damage.  Eye damage.  Metabolic syndrome.  Memory and concentration problems. HOW TO MANAGE YOUR BLOOD PRESSURE Blood pressure can be managed effectively with lifestyle changes and medicines (if needed). Your caregiver will help you come up with a plan to bring your blood pressure within a normal range. Your plan should include the following: Education  Read all information provided by your caregivers about how to control blood pressure.  Educate yourself on the latest guidelines and treatment recommendations. New research is always being done to further define the risks and treatments for high blood pressure. Lifestylechanges  Control your weight.  Avoid smoking.  Stay physically active.  Reduce the amount of salt in your diet.  Reduce stress.  Control any chronic conditions, such as high cholesterol or diabetes.  Reduce your alcohol intake. Medicines  Several medicines (antihypertensive medicines) are available, if needed, to bring blood pressure within a normal range. Communication  Review all the medicines you take with your caregiver because there may be side effects or interactions.  Talk with your caregiver about your diet, exercise habits, and other lifestyle factors that may be contributing to high blood pressure.  See your caregiver regularly. Your caregiver can help you create and adjust your plan for managing high blood pressure. RECOMMENDATIONS FOR TREATMENT AND FOLLOW-UP  The following recommendations are based on current guidelines for managing high blood pressure in nonpregnant adults. Use these recommendations to identify the proper follow-up period or treatment option based on your blood pressure reading. You can discuss these options with your  caregiver.  Systolic pressure of 614 to 431 or diastolic pressure of 80 to 89: Follow up with your caregiver as directed.  Systolic pressure of 540 to 086 or diastolic pressure of 90 to 100: Follow up with your caregiver within 2 months.  Systolic pressure above 761 or diastolic pressure above 950: Follow up with your caregiver within 1 month.  Systolic pressure above 932 or diastolic pressure above 671: Consider antihypertensive therapy; follow up with your caregiver within 1 week.  Systolic pressure above 245 or diastolic pressure above 809: Begin antihypertensive therapy; follow up with your caregiver within 1 week. Document Released: 03/14/2012 Document Reviewed: 03/14/2012 [facility] Patient Information 2015 [place]. This information is not intended to replace advice given to you by your health care provider. Make sure you discuss any questions you have with your health care provider.  Smoking Cessation, Tips for Success If you are ready to quit smoking, congratulations! You have chosen to help yourself be healthier. Cigarettes bring nicotine, tar, carbon monoxide, and other irritants into your body. Your lungs, heart, and blood vessels will be able to work better without these poisons. There are many different ways to quit smoking. Nicotine gum, nicotine patches, a nicotine inhaler, or nicotine nasal spray can help with physical craving. Hypnosis, support groups, and medicines help break the habit of smoking. WHAT THINGS CAN I DO TO  MAKE QUITTING EASIER?  Here are some tips to help you quit for good:  Pick a date when you will quit smoking completely. Tell all of your friends and family about your plan to quit on that date.  Do not try to slowly cut down on the number of cigarettes you are smoking. Pick a quit date and quit smoking completely starting on that day.  Throw away all cigarettes.   Clean and remove all ashtrays from your home, work, and car.  On a card, write down  your reasons for quitting. Carry the card with you and read it when you get the urge to smoke.  Cleanse your body of nicotine. Drink enough water and fluids to keep your urine clear or pale yellow. Do this after quitting to flush the nicotine from your body.  Learn to predict your moods. Do not let a bad situation be your excuse to have a cigarette. Some situations in your life might tempt you into wanting a cigarette.  Never have "just one" cigarette. It leads to wanting another and another. Remind yourself of your decision to quit.  Change habits associated with smoking. If you smoked while driving or when feeling stressed, try other activities to replace smoking. Stand up when drinking your coffee. Brush your teeth after eating. Sit in a different chair when you read the paper. Avoid alcohol while trying to quit, and try to drink fewer caffeinated beverages. Alcohol and caffeine may urge you to smoke.  Avoid foods and drinks that can trigger a desire to smoke, such as sugary or spicy foods and alcohol.  Ask people who smoke not to smoke around you.  Have something planned to do right after eating or having a cup of coffee. For example, plan to take a walk or exercise.  Try a relaxation exercise to calm you down and decrease your stress. Remember, you may be tense and nervous for the first 2 weeks after you quit, but this will pass.  Find new activities to keep your hands busy. Play with a pen, coin, or rubber band. Doodle or draw things on paper.  Brush your teeth right after eating. This will help cut down on the craving for the taste of tobacco after meals. You can also try mouthwash.   Use oral substitutes in place of cigarettes. Try using lemon drops, carrots, cinnamon sticks, or chewing gum. Keep them handy so they are available when you have the urge to smoke.  When you have the urge to smoke, try deep breathing.  Designate your home as a nonsmoking area.  If you are a heavy  smoker, ask your health care provider about a prescription for nicotine chewing gum. It can ease your withdrawal from nicotine.  Reward yourself. Set aside the cigarette money you save and buy yourself something nice.  Look for support from others. Join a support group or smoking cessation program. Ask someone at home or at work to help you with your plan to quit smoking.  Always ask yourself, "Do I need this cigarette or is this just a reflex?" Tell yourself, "Today, I choose not to smoke," or "I do not want to smoke." You are reminding yourself of your decision to quit.  Do not replace cigarette smoking with electronic cigarettes (commonly called e-cigarettes). The safety of e-cigarettes is unknown, and some may contain harmful chemicals.  If you relapse, do not give up! Plan ahead and think about what you will do the next time you get  the urge to smoke. HOW WILL I FEEL WHEN I QUIT SMOKING? You may have symptoms of withdrawal because your body is used to nicotine (the addictive substance in cigarettes). You may crave cigarettes, be irritable, feel very hungry, cough often, get headaches, or have difficulty concentrating. The withdrawal symptoms are only temporary. They are strongest when you first quit but will go away within 10-14 days. When withdrawal symptoms occur, stay in control. Think about your reasons for quitting. Remind yourself that these are signs that your body is healing and getting used to being without cigarettes. Remember that withdrawal symptoms are easier to treat than the major diseases that smoking can cause.  Even after the withdrawal is over, expect periodic urges to smoke. However, these cravings are generally short lived and will go away whether you smoke or not. Do not smoke! WHAT RESOURCES ARE AVAILABLE TO HELP ME QUIT SMOKING? Your health care provider can direct you to community resources or hospitals for support, which may include:  Group  support.  Education.  Hypnosis.  Therapy. Document Released: 03/18/2004 Document Revised: 11/04/2013 Document Reviewed: 12/06/2012 [facility] Patient Information 2015 [place], [place]. This information is not intended to replace advice given to you by your health care provider. Make sure you discuss any questions you have with your health care provider.  Diabetes and Standards of Medical Care Diabetes is complicated. You may find that your diabetes team includes a dietitian, nurse, diabetes educator, eye doctor, and more. To help everyone know what is going on and to help you get the care you deserve, the following schedule of care was developed to help keep you on track. Below are the tests, exams, vaccines, medicines, education, and plans you will need. HbA1c test This test shows how well you have controlled your glucose over the past 2-3 months. It is used to see if your diabetes management plan needs to be adjusted.   It is performed at least 2 times a year if you are meeting treatment goals.  It is performed 4 times a year if therapy has changed or if you are not meeting treatment goals. Blood pressure test  This test is performed at every routine medical visit. The goal is less than 140/90 mm Hg for most people, but 130/80 mm Hg in some cases. Ask your health care provider about your goal. Dental exam  Follow up with the dentist regularly. Eye exam  If you are diagnosed with type 1 diabetes as a child, get an exam upon reaching the age of 54 years or older and have had diabetes for 3-5 years. Yearly eye exams are recommended after that initial eye exam.  If you are diagnosed with type 1 diabetes as an adult, get an exam within 5 years of diagnosis and then yearly.  If you are diagnosed with type 2 diabetes, get an exam as soon as possible after the diagnosis and then yearly. Foot care exam  Visual foot exams are performed at every routine medical visit. The exams check for cuts,  injuries, or other problems with the feet.  A comprehensive foot exam should be done yearly. This includes visual inspection as well as assessing foot pulses and testing for loss of sensation.  Check your feet nightly for cuts, injuries, or other problems with your feet. Tell your health care provider if anything is not healing. Kidney function test (urine microalbumin)  This test is performed once a year.  Type 1 diabetes: The first test is performed 5 years after  diagnosis.  Type 2 diabetes: The first test is performed at the time of diagnosis.  A serum creatinine and estimated glomerular filtration rate (eGFR) test is done once a year to assess the level of chronic kidney disease (CKD), if present. Lipid profile (cholesterol, HDL, LDL, triglycerides)  Performed every 5 years for most people.  The goal for LDL is less than 100 mg/dL. If you are at high risk, the goal is less than 70 mg/dL.  The goal for HDL is 40 mg/dL-50 mg/dL for men and 50 mg/dL-60 mg/dL for women. An HDL cholesterol of 60 mg/dL or higher gives some protection against heart disease.  The goal for triglycerides is less than 150 mg/dL. Influenza vaccine, pneumococcal vaccine, and hepatitis B vaccine  The influenza vaccine is recommended yearly.  It is recommended that people with diabetes who are over 46 years old get the pneumonia vaccine. In some cases, two separate shots may be given. Ask your health care provider if your pneumonia vaccination is up to date.  The hepatitis B vaccine is also recommended for adults with diabetes. Diabetes self-management education  Education is recommended at diagnosis and ongoing as needed. Treatment plan  Your treatment plan is reviewed at every medical visit. Document Released: 04/17/2009 Document Revised: 11/04/2013 Document Reviewed: 11/20/2012 [facility] Patient Information 2015 [place], [place]. This information is not intended to replace advice given to you by your  health care provider. Make sure you discuss any questions you have with your health care provider.

## 2014-03-12 NOTE — ED Notes (Signed)
Pt requesting refill on medication.  Pt has not missed any pills.  Currently looking for new primary.    Needs refill on metformin and metoporol.

## 2014-03-12 NOTE — ED Provider Notes (Signed)
Medical screening examination/treatment/procedure(s) were performed by a resident physician or non-physician practitioner and as the supervising physician I was immediately available for consultation/collaboration.  David Merrell, MD Family Medicine   David J Merrell, MD 03/12/14 2030 

## 2014-03-13 ENCOUNTER — Ambulatory Visit: Payer: Self-pay | Admitting: Internal Medicine

## 2014-03-20 ENCOUNTER — Ambulatory Visit: Payer: Self-pay | Admitting: Internal Medicine

## 2014-04-01 ENCOUNTER — Emergency Department: Payer: Self-pay | Admitting: Emergency Medicine

## 2014-04-18 ENCOUNTER — Ambulatory Visit: Payer: Self-pay

## 2014-05-07 ENCOUNTER — Ambulatory Visit: Payer: Self-pay

## 2014-06-02 ENCOUNTER — Ambulatory Visit: Payer: Self-pay

## 2014-06-23 ENCOUNTER — Ambulatory Visit: Payer: Self-pay

## 2014-06-30 ENCOUNTER — Ambulatory Visit: Payer: Self-pay | Admitting: Internal Medicine

## 2014-07-01 ENCOUNTER — Telehealth: Payer: Self-pay | Admitting: Internal Medicine

## 2014-07-01 ENCOUNTER — Other Ambulatory Visit: Payer: Self-pay | Admitting: Emergency Medicine

## 2014-07-01 MED ORDER — METOPROLOL TARTRATE 50 MG PO TABS: 50.0000 mg | ORAL_TABLET | Freq: Two times a day (BID) | ORAL | Status: DC

## 2014-07-01 NOTE — Telephone Encounter (Signed)
Pt missed scheduled appt due transportation issues and has rescheduled appt but is out of bp meds and is requesting refill. Please f/u with pt.

## 2014-07-01 NOTE — Telephone Encounter (Signed)
Pt missed scheduled appt due transportation issues and has rescheduled appt but is out of bp meds and is requesting refill. Please f/u with pt.  °

## 2014-07-07 ENCOUNTER — Ambulatory Visit: Payer: Self-pay | Admitting: Internal Medicine

## 2014-07-14 ENCOUNTER — Telehealth: Payer: Self-pay | Admitting: Internal Medicine

## 2014-07-14 ENCOUNTER — Ambulatory Visit: Payer: Self-pay | Admitting: Internal Medicine

## 2014-07-14 NOTE — Telephone Encounter (Signed)
Pt.'s appt. Was reschedule this appt....Pt. Needs medication refill on metoprolol tartrate (LOPRESSOR) 50 MG tablet [[id number]][[id number]]......please [facility] ref[facility] Pharm[facility]  [id number] [place] [place] - [address]

## 2014-07-18 ENCOUNTER — Other Ambulatory Visit: Payer: Self-pay | Admitting: *Deleted

## 2014-07-18 MED ORDER — METOPROLOL TARTRATE 50 MG PO TABS: 50.0000 mg | ORAL_TABLET | Freq: Two times a day (BID) | ORAL | Status: DC

## 2014-07-19 ENCOUNTER — Emergency Department: Payer: Self-pay | Admitting: Emergency Medicine

## 2014-07-22 ENCOUNTER — Ambulatory Visit: Payer: Self-pay | Admitting: Internal Medicine

## 2014-07-25 ENCOUNTER — Ambulatory Visit: Payer: Self-pay

## 2014-08-15 ENCOUNTER — Other Ambulatory Visit: Payer: Self-pay | Admitting: Internal Medicine

## 2014-09-02 ENCOUNTER — Ambulatory Visit: Payer: Self-pay | Admitting: Internal Medicine

## 2014-09-12 ENCOUNTER — Emergency Department: Payer: Self-pay | Admitting: Student

## 2014-09-26 ENCOUNTER — Telehealth: Payer: Self-pay | Admitting: Internal Medicine

## 2014-09-26 NOTE — Telephone Encounter (Signed)
Pt has an appointment on Friday the first with pcp but is completely out of blood pressure medication metoprolol (LOPRESSOR) 50 MG tablet. Please follow up with patient if you are able to provide a sample.

## 2014-09-27 ENCOUNTER — Other Ambulatory Visit: Payer: Self-pay | Admitting: Internal Medicine

## 2014-09-29 ENCOUNTER — Telehealth: Payer: Self-pay | Admitting: *Deleted

## 2014-09-29 ENCOUNTER — Telehealth: Payer: Self-pay | Admitting: Internal Medicine

## 2014-09-29 MED ORDER — METOPROLOL TARTRATE 50 MG PO TABS: 50.0000 mg | ORAL_TABLET | Freq: Two times a day (BID) | ORAL | Status: DC

## 2014-09-29 NOTE — Telephone Encounter (Signed)
Pt called requesting medication refill for metoprolol (LOPRESSOR) 50 MG tablet. Pt is completely out of medication and has appointmentt scheduled until Friday. Please f/u with pt

## 2014-09-29 NOTE — Telephone Encounter (Signed)
Patient requesting enough metoprolol to last until his appointment on Fri.  [facility]ill sent to [facility]

## 2014-10-03 ENCOUNTER — Ambulatory Visit: Payer: Self-pay | Admitting: Internal Medicine

## 2014-10-08 ENCOUNTER — Emergency Department
Admit: 2014-10-08 | Disposition: A | Discharge status: Home / Self Care | Payer: Self-pay | Admitting: Emergency Medicine

## 2014-10-08 ENCOUNTER — Ambulatory Visit: Payer: Self-pay | Admitting: Internal Medicine

## 2014-11-05 ENCOUNTER — Telehealth: Payer: Self-pay | Admitting: Internal Medicine

## 2014-11-05 NOTE — Telephone Encounter (Signed)
Patient called requesting to speak to nurse, states he has been in a lot of pain and need medication refill  Patient is also requesting medication refill for metoprolol (LOPRESSOR) 50 MG tablet. Please f/u with patient

## 2014-11-07 ENCOUNTER — Telehealth: Payer: Self-pay | Admitting: Internal Medicine

## 2014-11-07 ENCOUNTER — Ambulatory Visit: Payer: Self-pay | Admitting: Internal Medicine

## 2014-11-07 NOTE — Telephone Encounter (Signed)
Patient called to request a med refill for Metformin, please f/u with pt. °

## 2014-11-11 ENCOUNTER — Telehealth: Payer: Self-pay | Admitting: *Deleted

## 2014-11-11 MED ORDER — METFORMIN HCL 500 MG PO TABS: 500.0000 mg | ORAL_TABLET | Freq: Two times a day (BID) | ORAL | Status: DC

## 2014-11-11 NOTE — Telephone Encounter (Signed)
I refilled pt's metformin and sent it to his pharmacy.

## 2014-12-17 ENCOUNTER — Emergency Department
Admission: EM | Admit: 2014-12-17 | Discharge: 2014-12-17 | Disposition: A | Discharge status: Home / Self Care | Payer: Self-pay | Attending: Emergency Medicine | Admitting: Emergency Medicine

## 2014-12-17 ENCOUNTER — Encounter: Payer: Self-pay | Admitting: Emergency Medicine

## 2014-12-17 DIAGNOSIS — I10 Essential (primary) hypertension: Secondary | ICD-10-CM | POA: Insufficient documentation

## 2014-12-17 DIAGNOSIS — K089 Disorder of teeth and supporting structures, unspecified: Secondary | ICD-10-CM

## 2014-12-17 DIAGNOSIS — Z720 Tobacco use: Secondary | ICD-10-CM | POA: Insufficient documentation

## 2014-12-17 DIAGNOSIS — Z760 Encounter for issue of repeat prescription: Secondary | ICD-10-CM | POA: Insufficient documentation

## 2014-12-17 DIAGNOSIS — G8929 Other chronic pain: Secondary | ICD-10-CM | POA: Insufficient documentation

## 2014-12-17 DIAGNOSIS — K088 Other specified disorders of teeth and supporting structures: Secondary | ICD-10-CM | POA: Insufficient documentation

## 2014-12-17 DIAGNOSIS — E1140 Type 2 diabetes mellitus with diabetic neuropathy, unspecified: Secondary | ICD-10-CM | POA: Insufficient documentation

## 2014-12-17 HISTORY — DX: Acute embolism and thrombosis of unspecified vein: I82.90

## 2014-12-17 MED ORDER — METFORMIN HCL 500 MG PO TABS: 500.0000 mg | ORAL_TABLET | Freq: Three times a day (TID) | ORAL | Status: DC

## 2014-12-17 MED ORDER — METOPROLOL TARTRATE 50 MG PO TABS: 50.0000 mg | ORAL_TABLET | Freq: Two times a day (BID) | ORAL | Status: DC

## 2014-12-17 MED ORDER — AMOXICILLIN 500 MG PO TABS: 500.0000 mg | ORAL_TABLET | Freq: Two times a day (BID) | ORAL | Status: DC

## 2014-12-17 MED ORDER — NAPROXEN 500 MG PO TABS: 500.0000 mg | ORAL_TABLET | Freq: Two times a day (BID) | ORAL | Status: DC

## 2014-12-17 NOTE — ED Provider Notes (Signed)
[facility] Emergency Department Provider Note  ____________________________________________  Time seen: Approximately 3:38 PM  I have reviewed the triage vital signs and the nursing notes.   HISTORY  Chief Complaint Dental Pain    HPI [name] is a 54 y.o. male who presents to the emergency department for medication refill and dental pain on the right upper jaw. Dental pain has been present for several months, but worsening over the past 2 or 3 days. He has had no relief with Tylenol. He states that he has been unable to get to the clinic in [place] to get medication refills for his blood pressure and diabetes. He states that he used to go to [facility] but the rules changed and he is no longer able to be seen there.   Past Medical History  Diagnosis Date  . Hypertension   . Diabetes mellitus   . DVT (deep venous thrombosis)   . Blood clot in vein     Patient Active Problem List   Diagnosis Date Noted  . Thrombocytopenia, unspecified 12/06/2012  . Diabetic neuropathy 12/05/2012  . Smoker 12/05/2012  . Dyslipidemia 12/05/2012  . Type II or unspecified type diabetes mellitus without mention of complication, uncontrolled 05/11/2011  . Hypertension 05/11/2011    Past Surgical History  Procedure Laterality Date  . Appendectomy    . Vein ligation and stripping      of left leg    Current Outpatient Rx  Name  Route  Sig  Dispense  Refill  . amoxicillin (AMOXIL) 500 MG tablet   Oral   Take 1 tablet (500 mg total) by mouth 2 (two) times daily.   30 tablet   0   . ampicillin (PRINCIPEN) 500 MG capsule   Oral   Take 1 capsule (500 mg total) by mouth 4 (four) times daily.   28 capsule   0   . Blood Glucose Monitoring Suppl (RELION PRIME MONITOR) DEVI      Use as instructed by physician   1 Device   0   . gemfibrozil (LOPID) 600 MG tablet   Oral   Take 1 tablet (600 mg total) by mouth 2 (two) times daily before a meal.   60 tablet  2   . metFORMIN (GLUCOPHAGE) 500 MG tablet   Oral   Take 1 tablet (500 mg total) by mouth 3 (three) times daily with meals.   90 tablet   11   . metoprolol (LOPRESSOR) 50 MG tablet   Oral   Take 1 tablet (50 mg total) by mouth 2 (two) times daily.   60 tablet   11   . naproxen (NAPROSYN) 500 MG tablet   Oral   Take 1 tablet (500 mg total) by mouth 2 (two) times daily with a meal.   30 tablet   0   . Vitamin D, Ergocalciferol, (DRISDOL) 50000 UNITS CAPS capsule   Oral   Take 1 capsule (50,000 Units total) by mouth every 7 (seven) days.   30 capsule   1     Allergies Erythromycin base; Methocarbamol; and Sulfa antibiotics  Family History  Problem Relation Age of Onset  . Heart disease Mother   . Diabetes Father   . Heart disease Father     Social History History  Substance Use Topics  . Smoking status: Current Every Day Smoker -- 1.00 packs/day    Types: Cigarettes  . Smokeless tobacco: Not on file  . Alcohol Use: No  Review of Systems Constitutional: No fever/chills Eyes: No visual changes. ENT: No sore throat. Pain in the right upper jaw. Cardiovascular: Denies chest pain. Respiratory: Denies shortness of breath. Gastrointestinal: No abdominal pain.  No nausea, no vomiting.  No diarrhea.  No constipation. Genitourinary: Negative for dysuria. Musculoskeletal: Negative for back pain. Skin: Negative for rash. Neurological: Negative for headaches, focal weakness or numbness.  10-point ROS otherwise negative.  ____________________________________________   PHYSICAL EXAM:  VITAL SIGNS: ED Triage Vitals  Enc Vitals Group     BP 12/17/14 1305 127/84 mmHg     Pulse Rate 12/17/14 1305 75     Resp 12/17/14 1305 18     Temp 12/17/14 1305 98.1 F (36.7 C)     Temp Source 12/17/14 1305 Oral     SpO2 12/17/14 1305 97 %     Weight 12/17/14 1305 210 lb (95.255 kg)     Height 12/17/14 1305 5\' 11"  (1.803 m)     Head Cir --      Peak Flow --      Pain  Score 12/17/14 1308 8     Pain Loc --      Pain Edu? --      Excl. in GC? --     Constitutional: Alert and oriented. Well appearing and in no acute distress. Eyes: Conjunctivae are normal. PERRL. EOMI. Head: Atraumatic. Nose: No congestion/rhinnorhea. Mouth/Throat: Mucous membranes are moist.  Oropharynx non-erythematous. Widespread dental decay. Swelling noted around the gumline of the first 2 molars on the right side. Very mild facial swelling on the right maxillary area.  Neck: No stridor.   Cardiovascular: Normal rate, regular rhythm. Grossly normal heart sounds.  Good peripheral circulation. Respiratory: Normal respiratory effort.  No retractions. Lungs CTAB. Gastrointestinal: Soft and nontender. No distention. No abdominal bruits. No CVA tenderness. Musculoskeletal: No lower extremity tenderness nor edema.  No joint effusions. Neurologic:  Normal speech and language. No gross focal neurologic deficits are appreciated. Speech is normal. No gait instability. Skin:  Skin is warm, dry and intact. No rash noted. Psychiatric: Mood and affect are normal. Speech and behavior are normal.  ____________________________________________   LABS (all labs ordered are listed, but only abnormal results are displayed)  Labs Reviewed - No data to display ____________________________________________  EKG   ____________________________________________  RADIOLOGY   ____________________________________________   PROCEDURES  Procedure(s) performed: None  Critical Care performed: No  ____________________________________________   INITIAL IMPRESSION / ASSESSMENT AND PLAN / ED COURSE  Pertinent labs & imaging results that were available during my care of the patient were reviewed by me and considered in my medical decision making (see chart for details).  Care management in to discuss options for follow-up for medication management and refills. List of dental clinics provided. Patient  was advised to schedule an appointment as soon as possible with both primary care and dentist. He was advised to return to the emergency department for symptoms that change or worsen if he is unable to schedule an appointment. ____________________________________________   FINAL CLINICAL IMPRESSION(S) / ED DIAGNOSES  Final diagnoses:  Medication refill  Chronic dental pain      Chinita Pester, FNP 12/17/14 1553  Minna Antis, MD 12/17/14 2308

## 2014-12-17 NOTE — Discharge Instructions (Signed)
OPTIONS FOR DENTAL FOLLOW UP CARE ° °[place] Department of Health and Human Services - Local Safety Net Dental Clinics °http://www.ncdhhs.gov/dph/oralhealth/services/safetynetclinics.htm °  °[facility] (336-562-3123) ° °[facility] (919-933-9087) ° °[facility] ([phone] ext [phone]) ° ° [facility] ([phone]) ° °[facility] ([phone]) °This clinic caters to the indigent population and is on a lottery system. °Location: °[facility], [address], [place] °Clinic Hours: °Wednesdays from 6pm - 9pm, patients seen by a lottery system. °For dates, call or go to [email or web address].edu/[facility]/patients/Dental-[facility] °Services: °Cleanings, fillings and simple extractions. °Payment Options: °DENTAL WORK IS FREE OF CHARGE. Bring proof of income or support. °Best way to get seen: °Arrive at 5:15 pm - this is a lottery, NOT first come/first serve, so arriving earlier will not increase your chances of being seen. °  °  °[facility] °[phone] °Select option 1 for emergencies °  °Location: °[facility], [address], [place] °Clinic Hours: °No walk-ins accepted - call the day before to schedule an appointment. °Check in times are 9:30 am and 1:30 pm. °Services: °Simple extractions, temporary fillings, pulpectomy/pulp debridement, uncomplicated abscess drainage. °Payment Options: °PAYMENT IS DUE AT THE TIME OF SERVICE.  Fee is usually $100-200, additional surgical procedures (e.g. abscess drainage) may be extra. °Cash, checks, Visa/MasterCard accepted.  Can file Medicaid if patient is covered for dental - patient should call case worker to check. °No discount for [facility] patients. °Best way to get seen: °MUST call the day before and get onto the schedule. Can usually be seen the next 1-2 days. No walk-ins accepted. °  °  °[facility] °[phone] °   °Location: °[facility], [address], [place] °Clinic Hours: °M, W, Th, F 8am or 1:30pm, Tues 9a or 1:30 - first come/first served. °Services: °Simple extractions, temporary fillings, uncomplicated abscess drainage.  You do not need to be an [place] resident. °Payment Options: °PAYMENT IS DUE AT THE TIME OF SERVICE. °Dental insurance, otherwise sliding scale - bring proof of income or support. °Depending on income and treatment needed, cost is usually $50-200. °Best way to get seen: °Arrive early as it is first come/first served. °  °  °[facility] °[phone] °  °Location: °[address] °Clinic Hours: °Mon-Thu 8a-5p °Services: °Most basic dental services including extractions and fillings. °Payment Options: °PAYMENT IS DUE AT THE TIME OF SERVICE. °Sliding scale, up to 50% off - bring proof if income or support. °Medicaid with dental option accepted. °Best way to get seen: °Call to schedule an appointment, can usually be seen within 2 weeks OR they will try to see walk-ins - show up at 8a or 2p (you may have to wait). °  °  °[facility] °919-245-2435 °[place] RESIDENTS ONLY °  °Location: °[facility], [address], [place], [place] [address] °Clinic Hours: By appointment only. °Monday - Thursday 8am-5pm, Friday 8am-12pm °Services: Cleanings, fillings, extractions. °Payment Options: °PAYMENT IS DUE AT THE TIME OF SERVICE. °Cash, Visa or MasterCard. Sliding scale - $30 minimum per service. °Best way to get seen: °Come in to office, complete packet and make an appointment - need proof of income °or support monies for each household member and proof of [place] residence. °Usually takes about a month to get in. °  °  °[facility] °[phone] °  °Location: °[address]   [place] °Clinic Hours: Walk-in Urgent Care Dental Services are offered Monday-Friday  mornings only. °The numbers of emergencies accepted daily is limited to the number of °providers available. °Maximum 15 - Mondays, Wednesdays & Thursdays °Maximum 10 - Tuesdays & Fridays °Services: °You do not need to be a [place] resident to be seen for a dental emergency. °Emergencies are defined as pain, swelling, abnormal bleeding, or dental trauma. Walkins will receive x-rays if needed. °NOTE: Dental cleaning is not an emergency. °Payment Options: °PAYMENT IS DUE AT THE TIME OF SERVICE. °Minimum co-pay is $40.00 for uninsured patients. °Minimum co-pay is $3.00 for Medicaid with dental coverage. °Dental Insurance is accepted and must be presented at time of visit. °Medicare does not cover dental. °Forms of payment: Cash, credit card, checks. °Best way to get seen: °If not previously registered with the clinic, walk-in dental registration begins at 7:15 am and is on a first come/first serve basis. °If previously registered with the clinic, call to make an appointment. °  °  °[facility] °[phone] °[place] RESIDENTS ONLY °  °Location: °[address], [place], [place] °Clinic Hours: °Mon-Thu 10a-2p °Services: Extractions only! °Payment Options: °FREE (donations accepted) - bring proof of income or support °Best way to get seen: °Call and schedule an appointment OR come at 8am on the 1st Monday of every month (except for holidays) when it is first come/first served. °  °  °[facility] °[phone] °  °Location: °[address], [place] °Clinic Hours: °Friday mornings °Services, Payment Options, Best way to get seen: °Call for info °

## 2014-12-17 NOTE — ED Notes (Signed)
Med refill Metoprolol 50 mg BIB Metformin 500mg  TID  Pt also c/o right side dental pain for past 2 weeks.

## 2014-12-17 NOTE — Care Management Note (Signed)
Patient states that he does not have health insurance at this time, and has no source of income.  He has been going to a clinic in [place], but is no longer able to get transportation to the clinic.  Patient presents today for medication refill and dental pain.  I provided the patient with application to Medication Management, dental clinics, and [facility].  Both of the medications patient needs are on the $4 list at [facility].  Patient states that he would be able to afford them, he just needs a provider to write the prescription.  Patient also provide information on [facility] and Link.  Patient states that he has the current Link schedule at home.

## 2014-12-17 NOTE — ED Notes (Signed)
Pt alert and oriented X4, active, cooperative, pt in NAD. RR even and unlabored, color WNL.  Pt informed to return if any life threatening symptoms occur.   

## 2015-01-15 ENCOUNTER — Ambulatory Visit: Payer: Self-pay | Admitting: Internal Medicine

## 2015-01-20 ENCOUNTER — Emergency Department
Admission: EM | Admit: 2015-01-20 | Discharge: 2015-01-20 | Disposition: A | Discharge status: Home / Self Care | Payer: Self-pay | Attending: Emergency Medicine | Admitting: Emergency Medicine

## 2015-01-20 DIAGNOSIS — Z720 Tobacco use: Secondary | ICD-10-CM | POA: Insufficient documentation

## 2015-01-20 DIAGNOSIS — K088 Other specified disorders of teeth and supporting structures: Secondary | ICD-10-CM | POA: Insufficient documentation

## 2015-01-20 DIAGNOSIS — Z79899 Other long term (current) drug therapy: Secondary | ICD-10-CM | POA: Insufficient documentation

## 2015-01-20 DIAGNOSIS — I10 Essential (primary) hypertension: Secondary | ICD-10-CM | POA: Insufficient documentation

## 2015-01-20 DIAGNOSIS — Z792 Long term (current) use of antibiotics: Secondary | ICD-10-CM | POA: Insufficient documentation

## 2015-01-20 DIAGNOSIS — K029 Dental caries, unspecified: Secondary | ICD-10-CM | POA: Insufficient documentation

## 2015-01-20 DIAGNOSIS — E119 Type 2 diabetes mellitus without complications: Secondary | ICD-10-CM | POA: Insufficient documentation

## 2015-01-20 DIAGNOSIS — K0889 Other specified disorders of teeth and supporting structures: Secondary | ICD-10-CM

## 2015-01-20 MED ORDER — TRAMADOL HCL 50 MG PO TABS: 50.0000 mg | ORAL_TABLET | Freq: Four times a day (QID) | ORAL | Status: DC | PRN

## 2015-01-20 MED ORDER — TRAMADOL HCL 50 MG PO TABS
50.0000 mg | ORAL_TABLET | Freq: Once | ORAL | Status: AC
  Administered 2015-01-20: 50 mg via ORAL
  Filled 2015-01-20: qty 1

## 2015-01-20 MED ORDER — PENICILLIN V POTASSIUM 250 MG PO TABS
500.0000 mg | ORAL_TABLET | Freq: Once | ORAL | Status: AC
  Administered 2015-01-20: 500 mg via ORAL
  Filled 2015-01-20: qty 2

## 2015-01-20 MED ORDER — PENICILLIN V POTASSIUM 500 MG PO TABS: 500.0000 mg | ORAL_TABLET | Freq: Four times a day (QID) | ORAL | Status: AC

## 2015-01-20 MED ORDER — KETOROLAC TROMETHAMINE 60 MG/2ML IM SOLN
60.0000 mg | Freq: Once | INTRAMUSCULAR | Status: AC
  Administered 2015-01-20: 60 mg via INTRAMUSCULAR
  Filled 2015-01-20: qty 2

## 2015-01-20 NOTE — Discharge Instructions (Signed)

## 2015-01-20 NOTE — ED Provider Notes (Signed)
[facility] Emergency Department Provider Note  ____________________________________________  Time seen: Approximately 248 AM  I have reviewed the triage vital signs and the nursing notes.   HISTORY  Chief Complaint Dental Pain    HPI Richard MarseillesSamuel L [name] is a 54 y.o. male who reports that he has 3 abscessed teeth in his right upper jaw. The patient reports that he has tried calling tunnel clinics and they want him to pay cash before they will do his procedures. The patient reports that he's been having pain in his teeth for the last week but has been worsening in the last 3 days. The patient reports that he has been taking Tylenol for pain without any improvement. He reports this pain is 8 out of 10 in intensity in the right side of his face. The patient reports he has mild swelling to his right jaw and a headache. The patient came in because he reports that he is unable to tolerate the pain anymore and is unable to sleep due to the pain.   Past Medical History  Diagnosis Date  . Hypertension   . Diabetes mellitus   . DVT (deep venous thrombosis)   . Blood clot in vein     Patient Active Problem List   Diagnosis Date Noted  . Thrombocytopenia, unspecified 12/06/2012  . Diabetic neuropathy 12/05/2012  . Smoker 12/05/2012  . Dyslipidemia 12/05/2012  . Type II or unspecified type diabetes mellitus without mention of complication, uncontrolled 05/11/2011  . Hypertension 05/11/2011    Past Surgical History  Procedure Laterality Date  . Appendectomy    . Vein ligation and stripping      of left leg    Current Outpatient Rx  Name  Route  Sig  Dispense  Refill  . amoxicillin (AMOXIL) 500 MG tablet   Oral   Take 1 tablet (500 mg total) by mouth 2 (two) times daily.   30 tablet   0   . ampicillin (PRINCIPEN) 500 MG capsule   Oral   Take 1 capsule (500 mg total) by mouth 4 (four) times daily.   28 capsule   0   . Blood Glucose Monitoring Suppl (RELION  PRIME MONITOR) DEVI      Use as instructed by physician   1 Device   0   . gemfibrozil (LOPID) 600 MG tablet   Oral   Take 1 tablet (600 mg total) by mouth 2 (two) times daily before a meal.   60 tablet   2   . metFORMIN (GLUCOPHAGE) 500 MG tablet   Oral   Take 1 tablet (500 mg total) by mouth 3 (three) times daily with meals.   90 tablet   11   . metoprolol (LOPRESSOR) 50 MG tablet   Oral   Take 1 tablet (50 mg total) by mouth 2 (two) times daily.   60 tablet   11   . naproxen (NAPROSYN) 500 MG tablet   Oral   Take 1 tablet (500 mg total) by mouth 2 (two) times daily with a meal.   30 tablet   0   . penicillin v potassium (VEETID) 500 MG tablet   Oral   Take 1 tablet (500 mg total) by mouth 4 (four) times daily.   20 tablet   0   . traMADol (ULTRAM) 50 MG tablet   Oral   Take 1 tablet (50 mg total) by mouth every 6 (six) hours as needed.   12 tablet   0   .  Vitamin D, Ergocalciferol, (DRISDOL) 50000 UNITS CAPS capsule   Oral   Take 1 capsule (50,000 Units total) by mouth every 7 (seven) days.   30 capsule   1     Allergies Erythromycin base; Methocarbamol; and Sulfa antibiotics  Family History  Problem Relation Age of Onset  . Heart disease Mother   . Diabetes Father   . Heart disease Father     Social History History  Substance Use Topics  . Smoking status: Current Every Day Smoker -- 1.00 packs/day    Types: Cigarettes  . Smokeless tobacco: Not on file  . Alcohol Use: No    Review of Systems Constitutional: No fever/chills Eyes: No visual changes. ENT: Right upper molar dental pain with No sore throat. Cardiovascular: Denies chest pain. Respiratory: Denies shortness of breath. Gastrointestinal: No abdominal pain.  No nausea, no vomiting.  No diarrhea.  No constipation. Genitourinary: Negative for dysuria. Musculoskeletal: Negative for back pain. Skin: Negative for rash. Neurological: headache with no focal weakness or  numbness.  10-point ROS otherwise negative.  ____________________________________________   PHYSICAL EXAM:  VITAL SIGNS: ED Triage Vitals  Enc Vitals Group     BP 01/20/15 0122 134/89 mmHg     Pulse Rate 01/20/15 0122 70     Resp 01/20/15 0122 18     Temp 01/20/15 0122 97.9 F (36.6 C)     Temp Source 01/20/15 0122 Oral     SpO2 01/20/15 0122 97 %     Weight 01/20/15 0122 220 lb (99.791 kg)     Height 01/20/15 0122 5\' 11"  (1.803 m)     Head Cir --      Peak Flow --      Pain Score 01/20/15 0123 8     Pain Loc --      Pain Edu? --      Excl. in GC? --     Constitutional: Alert and oriented. Well appearing and in no acute distress. Eyes: Conjunctivae are normal. PERRL. EOMI. Head: Atraumatic. Nose: No congestion/rhinnorhea. Mouth/Throat: Mucous membranes are moist.  Dental decay in right upper first and second molars, mildly tender to palpation Cardiovascular: Normal rate, regular rhythm. Grossly normal heart sounds.  Good peripheral circulation. Respiratory: Normal respiratory effort.  No retractions. Lungs CTAB. Gastrointestinal: Soft and nontender. No distention. Active bowel sounds Genitourinary: Deferred Musculoskeletal: No lower extremity tenderness nor edema.  No joint effusions. Neurologic:  Normal speech and language.  Skin:  Skin is warm, dry and intact. No rash noted. Psychiatric: Mood and affect are normal.   ____________________________________________   LABS (all labs ordered are listed, but only abnormal results are displayed)  Labs Reviewed - No data to display ____________________________________________  EKG  None ____________________________________________  RADIOLOGY  None ____________________________________________   PROCEDURES  Procedure(s) performed: None  Critical Care performed: No  ____________________________________________   INITIAL IMPRESSION / ASSESSMENT AND PLAN / ED COURSE  Pertinent labs & imaging results that  were available during my care of the patient were reviewed by me and considered in my medical decision making (see chart for details).  This is a 54 year old male who comes in with some dental pain. I will give the patient a dose of penicillin, Toradol shot and some tramadol for pain. He'll be discharged to follow-up with a dentist who can evaluate his teeth and determine the appropriate care. The patient has no significant swelling to his face no significant tenderness to palpation. He'll be discharged home. ____________________________________________   FINAL CLINICAL IMPRESSION(S) /  ED DIAGNOSES  Final diagnoses:  Pain, dental      Rebecka Apley, MD 01/20/15 [phone]

## 2015-01-20 NOTE — ED Notes (Signed)
Toothache for few weeks

## 2015-06-24 NOTE — Telephone Encounter (Signed)
error 

## 2015-07-21 ENCOUNTER — Emergency Department
Admission: EM | Admit: 2015-07-21 | Discharge: 2015-07-21 | Disposition: A | Discharge status: Home / Self Care | Payer: Self-pay | Attending: Emergency Medicine | Admitting: Emergency Medicine

## 2015-07-21 ENCOUNTER — Encounter: Payer: Self-pay | Admitting: Emergency Medicine

## 2015-07-21 DIAGNOSIS — I10 Essential (primary) hypertension: Secondary | ICD-10-CM | POA: Insufficient documentation

## 2015-07-21 DIAGNOSIS — Z79899 Other long term (current) drug therapy: Secondary | ICD-10-CM | POA: Insufficient documentation

## 2015-07-21 DIAGNOSIS — F17210 Nicotine dependence, cigarettes, uncomplicated: Secondary | ICD-10-CM | POA: Insufficient documentation

## 2015-07-21 DIAGNOSIS — Z7984 Long term (current) use of oral hypoglycemic drugs: Secondary | ICD-10-CM | POA: Insufficient documentation

## 2015-07-21 DIAGNOSIS — K047 Periapical abscess without sinus: Secondary | ICD-10-CM | POA: Insufficient documentation

## 2015-07-21 DIAGNOSIS — K029 Dental caries, unspecified: Secondary | ICD-10-CM | POA: Insufficient documentation

## 2015-07-21 DIAGNOSIS — E119 Type 2 diabetes mellitus without complications: Secondary | ICD-10-CM | POA: Insufficient documentation

## 2015-07-21 MED ORDER — AMOXICILLIN 500 MG PO CAPS: 500.0000 mg | ORAL_CAPSULE | Freq: Three times a day (TID) | ORAL | Status: DC

## 2015-07-21 MED ORDER — LIDOCAINE VISCOUS 2 % MT SOLN: 20.0000 mL | OROMUCOSAL | Status: DC | PRN

## 2015-07-21 NOTE — ED Provider Notes (Signed)
CSN: [id number]     Arrival date & time 07/21/15  0946 History   First MD Initiated Contact with Patient 07/21/15 1048     Chief Complaint  Patient presents with  . Dental Pain     HPI Comments: 55 year old male presents today complaining of dental pain to left upper and lower jaw. No known dental injury. Pt has chronic dental problems and is not under the care of a dentist. Pain has worsened past few days. No swelling to gums or bad taste in mouth.     Past Medical History  Diagnosis Date  . Hypertension   . Diabetes mellitus   . DVT (deep venous thrombosis) (HCC)   . Blood clot in vein    Past Surgical History  Procedure Laterality Date  . Appendectomy    . Vein ligation and stripping      of left leg   Family History  Problem Relation Age of Onset  . Heart disease Mother   . Diabetes Father   . Heart disease Father    Social History  Substance Use Topics  . Smoking status: Current Every Day Smoker -- 1.00 packs/day    Types: Cigarettes  . Smokeless tobacco: None  . Alcohol Use: No    Review of Systems  HENT: Positive for dental problem.   All other systems reviewed and are negative.     Allergies  Erythromycin base; Methocarbamol; and Sulfa antibiotics  Home Medications   Prior to Admission medications   Medication Sig Start Date End Date Taking? Authorizing Provider  amoxicillin (AMOXIL) 500 MG capsule Take 1 capsule (500 mg total) by mouth 3 (three) times daily. 07/21/15   Christella Scheuermann, PA-C  Blood Glucose Monitoring Suppl (RELION PRIME MONITOR) DEVI Use as instructed by physician 05/29/13   Richarda Overlie, MD  gemfibrozil (LOPID) 600 MG tablet Take 1 tablet (600 mg total) by mouth 2 (two) times daily before a meal. 05/29/13   Richarda Overlie, MD  lidocaine (XYLOCAINE) 2 % solution Use as directed 20 mLs in the mouth or throat as needed for mouth pain. 07/21/15   Christella Scheuermann, PA-C  metFORMIN (GLUCOPHAGE) 500 MG tablet Take 1 tablet (500 mg total) by mouth  3 (three) times daily with meals. 12/17/14 12/17/15  Chinita Pester, FNP  metoprolol (LOPRESSOR) 50 MG tablet Take 1 tablet (50 mg total) by mouth 2 (two) times daily. 12/17/14 12/17/15  Chinita Pester, FNP  Vitamin D, Ergocalciferol, (DRISDOL) 50000 UNITS CAPS capsule Take 1 capsule (50,000 Units total) by mouth every 7 (seven) days. 05/24/13   Richarda Overlie, MD   BP 112/86 mmHg  Pulse 72  Temp(Src) 98.3 F (36.8 C) (Oral)  Resp 20  Ht  (1.803 m)  Wt 99.791 kg  BMI 30.70 kg/m2  SpO2 96% Physical Exam  Constitutional: He is oriented to person, place, and time. Vital signs are normal. He appears well-developed and well-nourished. He is active.  Non-toxic appearance. He does not have a sickly appearance. He does not appear ill.  HENT:  Head: Normocephalic and atraumatic.  Right Ear: External ear normal.  Left Ear: External ear normal.  Mouth/Throat: Oropharynx is clear and moist and mucous membranes are normal. Dental abscesses and dental caries present.    Neck: Normal range of motion. Neck supple.  Lymphadenopathy:    He has no cervical adenopathy.  Neurological: He is alert and oriented to person, place, and time.  Skin: Skin is warm and dry.  Psychiatric: He has a normal mood and affect. His behavior is normal. Judgment and thought content normal.  Nursing note and vitals reviewed.   ED Course  Procedures (including critical care time) Labs Review Labs Reviewed - No data to display  Imaging Review No results found. I have personally reviewed and evaluated these images and lab results as part of my medical decision-making.   EKG Interpretation None      MDM  Amox  TID x 10 days Viscous lido as needed Handout on local dental clinics provided  Final diagnoses:  Dental caries  Dental abscess        Christella Scheuermann, PA-C 07/21/15 1110  Myrna Blazer, MD 07/21/15 [phone]

## 2015-07-21 NOTE — ED Notes (Signed)
Pt c/o toothache to left top and lower jaw.

## 2015-07-21 NOTE — Discharge Instructions (Signed)

## 2015-10-02 ENCOUNTER — Ambulatory Visit: Payer: Self-pay | Admitting: Internal Medicine

## 2015-10-07 ENCOUNTER — Ambulatory Visit: Payer: Self-pay | Admitting: Internal Medicine

## 2015-10-27 ENCOUNTER — Other Ambulatory Visit: Payer: Self-pay | Admitting: Internal Medicine

## 2015-10-27 NOTE — Telephone Encounter (Signed)
Patient is requesting a medication refill for Metformin....patient has no transportation whatsoever and therefore unable to come in and be seen..... I have scheduled him an appointment and possibly arrange transportation

## 2015-10-30 ENCOUNTER — Encounter: Payer: Self-pay | Admitting: Emergency Medicine

## 2015-10-30 ENCOUNTER — Emergency Department
Admission: EM | Admit: 2015-10-30 | Discharge: 2015-10-30 | Disposition: A | Discharge status: Home / Self Care | Payer: Self-pay | Attending: Emergency Medicine | Admitting: Emergency Medicine

## 2015-10-30 DIAGNOSIS — E1165 Type 2 diabetes mellitus with hyperglycemia: Secondary | ICD-10-CM | POA: Insufficient documentation

## 2015-10-30 DIAGNOSIS — I10 Essential (primary) hypertension: Secondary | ICD-10-CM | POA: Insufficient documentation

## 2015-10-30 DIAGNOSIS — F17210 Nicotine dependence, cigarettes, uncomplicated: Secondary | ICD-10-CM | POA: Insufficient documentation

## 2015-10-30 LAB — GLUCOSE, CAPILLARY: Glucose-Capillary: 327 mg/dL — ABNORMAL HIGH (ref 65–99)

## 2015-10-30 MED ORDER — METFORMIN HCL 500 MG PO TABS
500.0000 mg | ORAL_TABLET | Freq: Once | ORAL | Status: AC
  Administered 2015-10-30: 500 mg via ORAL
  Filled 2015-10-30: qty 1

## 2015-10-30 MED ORDER — METFORMIN HCL 500 MG PO TABS
1000.0000 mg | ORAL_TABLET | Freq: Two times a day (BID) | ORAL | Status: DC
Start: 2015-10-30 — End: 2016-01-21

## 2015-10-30 NOTE — Discharge Instructions (Signed)
Hyperglycemia °Hyperglycemia occurs when the glucose (sugar) in your blood is too high. Hyperglycemia can happen for many reasons, but it most often happens to people who do not know they have diabetes or are not managing their diabetes properly.  °CAUSES  °Whether you have diabetes or not, there are other causes of hyperglycemia. Hyperglycemia can occur when you have diabetes, but it can also occur in other situations that you might not be as aware of, such as: °Diabetes °· If you have diabetes and are having problems controlling your blood glucose, hyperglycemia could occur because of some of the following reasons: °¨ Not following your meal plan. °¨ Not taking your diabetes medications or not taking it properly. °¨ Exercising less or doing less activity than you normally do. °¨ Being sick. °Pre-diabetes °· This cannot be ignored. Before people develop Type 2 diabetes, they almost always have "pre-diabetes." This is when your blood glucose levels are higher than normal, but not yet high enough to be diagnosed as diabetes. Research has shown that some long-term damage to the body, especially the heart and circulatory system, may already be occurring during pre-diabetes. If you take action to manage your blood glucose when you have pre-diabetes, you may delay or prevent Type 2 diabetes from developing. °Stress °· If you have diabetes, you may be "diet" controlled or on oral medications or insulin to control your diabetes. However, you may find that your blood glucose is higher than usual in the hospital whether you have diabetes or not. This is often referred to as "stress hyperglycemia." Stress can elevate your blood glucose. This happens because of hormones put out by the body during times of stress. If stress has been the cause of your high blood glucose, it can be followed regularly by your caregiver. That way he/she can make sure your hyperglycemia does not continue to get worse or progress to  diabetes. °Steroids °· Steroids are medications that act on the infection fighting system (immune system) to block inflammation or infection. One side effect can be a rise in blood glucose. Most people can produce enough extra insulin to allow for this rise, but for those who cannot, steroids make blood glucose levels go even higher. It is not unusual for steroid treatments to "uncover" diabetes that is developing. It is not always possible to determine if the hyperglycemia will go away after the steroids are stopped. A special blood test called an A1c is sometimes done to determine if your blood glucose was elevated before the steroids were started. °SYMPTOMS °· Thirsty. °· Frequent urination. °· Dry mouth. °· Blurred vision. °· Tired or fatigue. °· Weakness. °· Sleepy. °· Tingling in feet or leg. °DIAGNOSIS  °Diagnosis is made by monitoring blood glucose in one or all of the following ways: °· A1c test. This is a chemical found in your blood. °· Fingerstick blood glucose monitoring. °· Laboratory results. °TREATMENT  °First, knowing the cause of the hyperglycemia is important before the hyperglycemia can be treated. Treatment may include, but is not be limited to: °· Education. °· Change or adjustment in medications. °· Change or adjustment in meal plan. °· Treatment for an illness, infection, etc. °· More frequent blood glucose monitoring. °· Change in exercise plan. °· Decreasing or stopping steroids. °· Lifestyle changes. °HOME CARE INSTRUCTIONS  °· Test your blood glucose as directed. °· Exercise regularly. Your caregiver will give you instructions about exercise. Pre-diabetes or diabetes which comes on with stress is helped by exercising. °· Eat wholesome,   balanced meals. Eat often and at regular, fixed times. Your caregiver or nutritionist will give you a meal plan to guide your sugar intake. °· Being at an ideal weight is important. If needed, losing as little as 10 to 15 pounds may help improve blood  glucose levels. °SEEK MEDICAL CARE IF:  °· You have questions about medicine, activity, or diet. °· You continue to have symptoms (problems such as increased thirst, urination, or weight gain). °SEEK IMMEDIATE MEDICAL CARE IF:  °· You are vomiting or have diarrhea. °· Your breath smells fruity. °· You are breathing faster or slower. °· You are very sleepy or incoherent. °· You have numbness, tingling, or pain in your feet or hands. °· You have chest pain. °· Your symptoms get worse even though you have been following your caregiver's orders. °· If you have any other questions or concerns. °  °This information is not intended to replace advice given to you by your health care provider. Make sure you discuss any questions you have with your health care provider. °  °Document Released: 12/14/2000 Document Revised: 09/12/2011 Document Reviewed: 02/24/2015 °Elsevier Interactive Patient Education ©2016 Elsevier Inc. ° °

## 2015-10-30 NOTE — ED Provider Notes (Signed)
[facility] Emergency Department Provider Note  ____________________________________________  Time seen: Approximately 11:51 AM  I have reviewed the triage vital signs and the nursing notes.   HISTORY  Chief Complaint Medication Refill    HPI [name] is a 55 y.o. male with a past medical history of non-insulin-dependent diabetes. Patient reports that he is out of his metformin desires a refill until he can see his PCP in one week. Eyes any complaints no fever or chills body aches. States his unable to monitor his sugars at home secondary to no glucometer.  Past Medical History  Diagnosis Date  . Hypertension   . Diabetes mellitus   . DVT (deep venous thrombosis) (HCC)   . Blood clot in vein     Patient Active Problem List   Diagnosis Date Noted  . Thrombocytopenia, unspecified (HCC) 12/06/2012  . Diabetic neuropathy (HCC) 12/05/2012  . Smoker 12/05/2012  . Dyslipidemia 12/05/2012  . Type II or unspecified type diabetes mellitus without mention of complication, uncontrolled 05/11/2011  . Hypertension 05/11/2011    Past Surgical History  Procedure Laterality Date  . Appendectomy    . Vein ligation and stripping      of left leg    Current Outpatient Rx  Name  Route  Sig  Dispense  Refill  . amoxicillin (AMOXIL) 500 MG capsule   Oral   Take 1 capsule (500 mg total) by mouth 3 (three) times daily.   30 capsule   0   . Blood Glucose Monitoring Suppl (RELION PRIME MONITOR) DEVI      Use as instructed by physician   1 Device   0   . gemfibrozil (LOPID) 600 MG tablet   Oral   Take 1 tablet (600 mg total) by mouth 2 (two) times daily before a meal.   60 tablet   2   . lidocaine (XYLOCAINE) 2 % solution   Mouth/Throat   Use as directed 20 mLs in the mouth or throat as needed for mouth pain.   100 mL   0   . metFORMIN (GLUCOPHAGE) 500 MG tablet   Oral   Take 2 tablets (1,000 mg total) by mouth 2 (two) times daily with a meal.   120 tablet   0   . metoprolol (LOPRESSOR) 50 MG tablet   Oral   Take 1 tablet (50 mg total) by mouth 2 (two) times daily.   60 tablet   11   . Vitamin D, Ergocalciferol, (DRISDOL) 50000 UNITS CAPS capsule   Oral   Take 1 capsule (50,000 Units total) by mouth every 7 (seven) days.   30 capsule   1     Allergies Erythromycin base; Methocarbamol; and Sulfa antibiotics  Family History  Problem Relation Age of Onset  . Heart disease Mother   . Diabetes Father   . Heart disease Father     Social History Social History  Substance Use Topics  . Smoking status: Current Every Day Smoker -- 1.00 packs/day    Types: Cigarettes  . Smokeless tobacco: None  . Alcohol Use: No    Review of Systems Constitutional: No fever/chills Eyes: No visual changes. ENT: No sore throat. Cardiovascular: Denies chest pain. Respiratory: Denies shortness of breath. Gastrointestinal: No abdominal pain.  No nausea, no vomiting.  No diarrhea.  No constipation. Genitourinary: Negative for dysuria. Musculoskeletal: Negative for back pain. Skin: Negative for rash. Neurological: Negative for headaches, focal weakness or numbness.  10-point ROS otherwise negative.  ____________________________________________  PHYSICAL EXAM:  VITAL SIGNS: ED Triage Vitals  Enc Vitals Group     BP 10/30/15 1035 122/85 mmHg     Pulse Rate 10/30/15 1035 64     Resp 10/30/15 1035 18     Temp 10/30/15 1035 97.9 F (36.6 C)     Temp Source 10/30/15 1035 Oral     SpO2 10/30/15 1035 98 %     Weight 10/30/15 1035 210 lb (95.255 kg)     Height 10/30/15 1035 5\' 11"  (1.803 m)     Head Cir --      Peak Flow --      Pain Score --      Pain Loc --      Pain Edu? --      Excl. in GC? --     Constitutional: Alert and oriented. Well appearing and in no acute distress. Eyes: Conjunctivae are normal. PERRL. EOMI. Head: Atraumatic. Nose: No congestion/rhinnorhea. Mouth/Throat: Mucous membranes are moist.   Oropharynx non-erythematous. Neck: No stridor.   Cardiovascular: Normal rate, regular rhythm. Grossly normal heart sounds.  Good peripheral circulation. Respiratory: Normal respiratory effort.  No retractions. Lungs CTAB. Gastrointestinal: Soft and nontender. No distention. No abdominal bruits. No CVA tenderness. Musculoskeletal: No lower extremity tenderness nor edema.  No joint effusions. Neurologic:  Normal speech and language. No gross focal neurologic deficits are appreciated. No gait instability. Skin:  Skin is warm, dry and intact. No rash noted. Psychiatric: Mood and affect are normal. Speech and behavior are normal.  ____________________________________________   LABS (all labs ordered are listed, but only abnormal results are displayed)  Labs Reviewed  GLUCOSE, CAPILLARY - Abnormal; Notable for the following:    Glucose-Capillary 327 (*)    All other components within normal limits  CBG MONITORING, ED   ____________________________________________    PROCEDURES  Procedure(s) performed: None  Critical Care performed: No  ____________________________________________   INITIAL IMPRESSION / ASSESSMENT AND PLAN / ED COURSE  Pertinent labs & imaging results that were available during my care of the patient were reviewed by me and considered in my medical decision making (see chart for details).  Non-insulin-dependent diabetes with med refill given. Patient started off with metformin 500 mg twice a day as directed. Encouraged to follow up with PCP next week as scheduled. Blood sugar was 327 while in the ED today. Patient voices understanding and offers no other emergency medical complaints at this time. ____________________________________________   FINAL CLINICAL IMPRESSION(S) / ED DIAGNOSES  Final diagnoses:  Type 2 diabetes mellitus with hyperglycemia, without long-term current use of insulin (HCC)     This chart was dictated using voice recognition  software/Dragon. Despite best efforts to proofread, errors can occur which can change the meaning. Any change was purely unintentional.   Evangeline Dakinharles M Beers, PA-C 10/30/15 1153  Emily FilbertJonathan E Williams, MD 10/30/15 [phone]

## 2015-10-30 NOTE — ED Notes (Signed)
States that he missed a MD apt and so they will not refill his diabetes meds, needs refill

## 2015-10-30 NOTE — ED Notes (Signed)
States he is out of metformin 500 mg   States he has an appt on may 8th ..sometimes he has to take 1 extra pill daily

## 2015-11-09 ENCOUNTER — Ambulatory Visit: Payer: Self-pay | Admitting: Family Medicine

## 2015-11-27 ENCOUNTER — Ambulatory Visit: Payer: Self-pay | Admitting: Family Medicine

## 2015-12-18 ENCOUNTER — Telehealth: Payer: Self-pay | Admitting: Internal Medicine

## 2015-12-18 NOTE — Telephone Encounter (Signed)
Medication Refill: metFORMIN (GLUCOPHAGE) 500 MG tablet and metoprolol tartrate (LOPRESSOR) tablet 50 mg    Pt will call in July to schedule appointment to reestablish with PCP

## 2015-12-18 NOTE — Telephone Encounter (Signed)
I will not refill these medications, patient has not been seen since 2014 and has done this multiple times where he has called for refills and then does not come to the clinic. Will not refill until actually seen in clinic.

## 2015-12-21 ENCOUNTER — Telehealth: Payer: Self-pay | Admitting: Pharmacist

## 2015-12-21 NOTE — Telephone Encounter (Signed)
error 

## 2015-12-25 ENCOUNTER — Encounter: Payer: Self-pay | Admitting: *Deleted

## 2015-12-25 ENCOUNTER — Emergency Department
Admission: EM | Admit: 2015-12-25 | Discharge: 2015-12-25 | Disposition: A | Discharge status: Home / Self Care | Payer: Self-pay | Attending: Emergency Medicine | Admitting: Emergency Medicine

## 2015-12-25 DIAGNOSIS — E785 Hyperlipidemia, unspecified: Secondary | ICD-10-CM | POA: Insufficient documentation

## 2015-12-25 DIAGNOSIS — Z760 Encounter for issue of repeat prescription: Secondary | ICD-10-CM | POA: Insufficient documentation

## 2015-12-25 DIAGNOSIS — Z86718 Personal history of other venous thrombosis and embolism: Secondary | ICD-10-CM | POA: Insufficient documentation

## 2015-12-25 DIAGNOSIS — Z7984 Long term (current) use of oral hypoglycemic drugs: Secondary | ICD-10-CM | POA: Insufficient documentation

## 2015-12-25 DIAGNOSIS — E119 Type 2 diabetes mellitus without complications: Secondary | ICD-10-CM | POA: Insufficient documentation

## 2015-12-25 DIAGNOSIS — I10 Essential (primary) hypertension: Secondary | ICD-10-CM | POA: Insufficient documentation

## 2015-12-25 DIAGNOSIS — F17210 Nicotine dependence, cigarettes, uncomplicated: Secondary | ICD-10-CM | POA: Insufficient documentation

## 2015-12-25 LAB — GLUCOSE, CAPILLARY: GLUCOSE-CAPILLARY: 269 mg/dL — AB (ref 65–99)

## 2015-12-25 MED ORDER — METFORMIN HCL 500 MG PO TABS: 500.0000 mg | ORAL_TABLET | Freq: Two times a day (BID) | ORAL | Status: DC

## 2015-12-25 MED ORDER — METOPROLOL TARTRATE 50 MG PO TABS
50.0000 mg | ORAL_TABLET | Freq: Once | ORAL | Status: AC
  Administered 2015-12-25: 50 mg via ORAL
  Filled 2015-12-25: qty 1

## 2015-12-25 MED ORDER — METOPROLOL TARTRATE 50 MG PO TABS: 50.0000 mg | ORAL_TABLET | Freq: Every day | ORAL | Status: DC

## 2015-12-25 NOTE — ED Notes (Signed)
Pt is unable to see MD until late July, pt needs Toprol and Metformin prescriptions

## 2015-12-25 NOTE — Discharge Instructions (Signed)
Medicine Refill at the Emergency Department We have refilled your medicine today, but it is best for you to get refills through your primary health care provider's office. In the future, please plan ahead so you do not need to get refills from the emergency department. If the medicine we refilled was a maintenance medicine, you may have received only enough to get you by until you are able to see your regular health care provider.   This information is not intended to replace advice given to you by your health care provider. Make sure you discuss any questions you have with your health care provider.   Document Released: 10/07/2003 Document Revised: 07/11/2014 Document Reviewed: 09/27/2013 Elsevier Interactive Patient Education 2016 [employer]   You MUST follow-up with one of the local community clinics for medication refill. You may NOT be offered refills if you return to the ED.

## 2015-12-25 NOTE — ED Notes (Signed)
See triage note  Denies any sx's

## 2016-01-06 ENCOUNTER — Inpatient Hospital Stay: Payer: Self-pay | Admitting: Family Medicine

## 2016-01-12 NOTE — ED Provider Notes (Signed)
[facility] Emergency Department Provider Note ____________________________________________  Time seen: 9  I have reviewed the triage vital signs and the nursing notes.  HISTORY  Chief Complaint  Medication Refill  HPI [name] is a 55 y.o. male presents to the ED for request of medication refill. Patient is well-known to the ED for routine refills on hypertension and diabetes medications. He has not selected or followed up with a primary care provider since his last visit and refill here. He denies any interim complaints.He claims he is unable to see a provider until late July at this time. He is requesting refills of Toprol and metformin.  Past Medical History  Diagnosis Date  . Hypertension   . Diabetes mellitus   . DVT (deep venous thrombosis) (HCC)   . Blood clot in vein     Patient Active Problem List   Diagnosis Date Noted  . Thrombocytopenia, unspecified (HCC) 12/06/2012  . Diabetic neuropathy (HCC) 12/05/2012  . Smoker 12/05/2012  . Dyslipidemia 12/05/2012  . Type II or unspecified type diabetes mellitus without mention of complication, uncontrolled 05/11/2011  . Hypertension 05/11/2011    Past Surgical History  Procedure Laterality Date  . Appendectomy    . Vein ligation and stripping      of left leg    Current Outpatient Rx  Name  Route  Sig  Dispense  Refill  . amoxicillin (AMOXIL) 500 MG capsule   Oral   Take 1 capsule (500 mg total) by mouth 3 (three) times daily.   30 capsule   0   . Blood Glucose Monitoring Suppl (RELION PRIME MONITOR) DEVI      Use as instructed by physician   1 Device   0   . gemfibrozil (LOPID) 600 MG tablet   Oral   Take 1 tablet (600 mg total) by mouth 2 (two) times daily before a meal.   60 tablet   2   . lidocaine (XYLOCAINE) 2 % solution   Mouth/Throat   Use as directed 20 mLs in the mouth or throat as needed for mouth pain.   100 mL   0   . metFORMIN (GLUCOPHAGE) 500 MG tablet   Oral   Take 2 tablets (1,000 mg total) by mouth 2 (two) times daily with a meal.   120 tablet   0   . metFORMIN (GLUCOPHAGE) 500 MG tablet   Oral   Take 1 tablet (500 mg total) by mouth 2 (two) times daily with a meal.   90 tablet   0   . metoprolol (LOPRESSOR) 50 MG tablet   Oral   Take 1 tablet (50 mg total) by mouth 2 (two) times daily.   60 tablet   11   . metoprolol (LOPRESSOR) 50 MG tablet   Oral   Take 1 tablet (50 mg total) by mouth daily.   60 tablet   0   . Vitamin D, Ergocalciferol, (DRISDOL) 50000 UNITS CAPS capsule   Oral   Take 1 capsule (50,000 Units total) by mouth every 7 (seven) days.   30 capsule   1     Allergies Erythromycin base; Methocarbamol; and Sulfa antibiotics  Family History  Problem Relation Age of Onset  . Heart disease Mother   . Diabetes Father   . Heart disease Father     Social History Social History  Substance Use Topics  . Smoking status: Current Every Day Smoker -- 1.00 packs/day    Types: Cigarettes  .  Smokeless tobacco: None  . Alcohol Use: No    Review of Systems  Constitutional: Negative for fever. Eyes: Negative for visual changes. Cardiovascular: Negative for chest pain. Respiratory: Negative for shortness of breath. Gastrointestinal: Negative for abdominal pain, vomiting and diarrhea. Musculoskeletal: Negative for back pain. Skin: Negative for rash. Neurological: Negative for headaches, focal weakness or numbness. ____________________________________________  PHYSICAL EXAM:  VITAL SIGNS: ED Triage Vitals  Enc Vitals Group     BP 12/25/15 0800 136/83 mmHg     Pulse Rate 12/25/15 0800 82     Resp 12/25/15 0800 18     Temp 12/25/15 0800 98 F (36.7 C)     Temp Source 12/25/15 0800 Oral     SpO2 12/25/15 0800 98 %     Weight 12/25/15 0800 210 lb (95.255 kg)     Height 12/25/15 0800 5\' 11"  (1.803 m)     Head Cir --      Peak Flow --      Pain Score --      Pain Loc --      Pain Edu? --      Excl.  in GC? --    Constitutional: Alert and oriented. Well appearing and in no distress. Head: Normocephalic and atraumatic. Cardiovascular: Normal rate, regular rhythm.  Respiratory: Normal respiratory effort. No wheezes/rales/rhonchi. Gastrointestinal: Soft and nontender. No distention. Musculoskeletal: Nontender with normal range of motion in all extremities.  Neurologic:  Normal gait without ataxia. Normal speech and language. No gross focal neurologic deficits are appreciated. Skin:  Skin is warm, dry and intact. No rash noted. ____________________________________________  INITIAL IMPRESSION / ASSESSMENT AND PLAN / ED COURSE  Patient with the ED encounter for medication refill. He is advised he must follow-up with his primary care provider as planned for routine medication management. He is provided with information regarding the [facility] Medication Assistance program. ____________________________________________  FINAL CLINICAL IMPRESSION(S) / ED DIAGNOSES  Final diagnoses:  Encounter for medication refill     Lissa HoardJenise V Bacon Nykeem Citro, PA-C 01/13/16 0006  Governor Rooksebecca Lord, MD 01/14/16 [phone]

## 2016-01-21 ENCOUNTER — Emergency Department (HOSPITAL_COMMUNITY)
Admission: EM | Admit: 2016-01-21 | Discharge: 2016-01-21 | Disposition: A | Discharge status: Home / Self Care | Payer: Self-pay | Attending: Emergency Medicine | Admitting: Emergency Medicine

## 2016-01-21 ENCOUNTER — Encounter (HOSPITAL_COMMUNITY): Payer: Self-pay | Admitting: Emergency Medicine

## 2016-01-21 ENCOUNTER — Ambulatory Visit: Payer: Self-pay | Attending: Internal Medicine | Admitting: Physician Assistant

## 2016-01-21 VITALS — BP 123/79 | HR 80 | Temp 97.5°F | Resp 16 | Wt 200.0 lb

## 2016-01-21 DIAGNOSIS — Z7984 Long term (current) use of oral hypoglycemic drugs: Secondary | ICD-10-CM | POA: Insufficient documentation

## 2016-01-21 DIAGNOSIS — E559 Vitamin D deficiency, unspecified: Secondary | ICD-10-CM | POA: Insufficient documentation

## 2016-01-21 DIAGNOSIS — E1310 Other specified diabetes mellitus with ketoacidosis without coma: Secondary | ICD-10-CM

## 2016-01-21 DIAGNOSIS — E119 Type 2 diabetes mellitus without complications: Secondary | ICD-10-CM | POA: Insufficient documentation

## 2016-01-21 DIAGNOSIS — I10 Essential (primary) hypertension: Secondary | ICD-10-CM | POA: Insufficient documentation

## 2016-01-21 DIAGNOSIS — E1110 Type 2 diabetes mellitus with ketoacidosis without coma: Secondary | ICD-10-CM

## 2016-01-21 DIAGNOSIS — E1169 Type 2 diabetes mellitus with other specified complication: Secondary | ICD-10-CM | POA: Insufficient documentation

## 2016-01-21 DIAGNOSIS — Z79899 Other long term (current) drug therapy: Secondary | ICD-10-CM | POA: Insufficient documentation

## 2016-01-21 DIAGNOSIS — F17210 Nicotine dependence, cigarettes, uncomplicated: Secondary | ICD-10-CM | POA: Insufficient documentation

## 2016-01-21 DIAGNOSIS — Z760 Encounter for issue of repeat prescription: Secondary | ICD-10-CM | POA: Insufficient documentation

## 2016-01-21 LAB — CBC WITH DIFFERENTIAL/PLATELET
BASOS PCT: 1 %
Basophils Absolute: 77 cells/uL (ref 0–200)
EOS ABS: 539 {cells}/uL — AB (ref 15–500)
Eosinophils Relative: 7 %
HEMATOCRIT: 49.9 % (ref 38.5–50.0)
Hemoglobin: 17.7 g/dL — ABNORMAL HIGH (ref 13.2–17.1)
LYMPHS PCT: 21 %
Lymphs Abs: 1617 cells/uL (ref 850–3900)
MCH: 32.1 pg (ref 27.0–33.0)
MCHC: 35.5 g/dL (ref 32.0–36.0)
MCV: 90.6 fL (ref 80.0–100.0)
MONO ABS: 462 {cells}/uL (ref 200–950)
MONOS PCT: 6 %
MPV: 11.2 fL (ref 7.5–12.5)
NEUTROS PCT: 65 %
Neutro Abs: 5005 cells/uL (ref 1500–7800)
Platelets: 102 10*3/uL — ABNORMAL LOW (ref 140–400)
RBC: 5.51 MIL/uL (ref 4.20–5.80)
RDW: 12.9 % (ref 11.0–15.0)
WBC: 7.7 10*3/uL (ref 3.8–10.8)

## 2016-01-21 LAB — COMPREHENSIVE METABOLIC PANEL
ALK PHOS: 105 U/L (ref 40–115)
ALT: 16 U/L (ref 9–46)
AST: 10 U/L (ref 10–35)
Albumin: 4.2 g/dL (ref 3.6–5.1)
BILIRUBIN TOTAL: 0.5 mg/dL (ref 0.2–1.2)
BUN: 19 mg/dL (ref 7–25)
CALCIUM: 9.3 mg/dL (ref 8.6–10.3)
CO2: 26 mmol/L (ref 20–31)
Chloride: 98 mmol/L (ref 98–110)
Creat: 0.82 mg/dL (ref 0.70–1.33)
GLUCOSE: 272 mg/dL — AB (ref 65–99)
POTASSIUM: 4 mmol/L (ref 3.5–5.3)
Sodium: 136 mmol/L (ref 135–146)
TOTAL PROTEIN: 6.7 g/dL (ref 6.1–8.1)

## 2016-01-21 LAB — TSH: TSH: 2.67 m[IU]/L (ref 0.40–4.50)

## 2016-01-21 LAB — POCT GLYCOSYLATED HEMOGLOBIN (HGB A1C): HEMOGLOBIN A1C: 13.5

## 2016-01-21 LAB — GLUCOSE, POCT (MANUAL RESULT ENTRY): POC GLUCOSE: 286 mg/dL — AB (ref 70–99)

## 2016-01-21 MED ORDER — METOPROLOL TARTRATE 50 MG PO TABS: 50.0000 mg | ORAL_TABLET | Freq: Two times a day (BID) | ORAL | Status: DC

## 2016-01-21 MED ORDER — TRUEPLUS LANCETS 28G MISC: Status: DC

## 2016-01-21 MED ORDER — GLUCOSE BLOOD VI STRP: ORAL_STRIP | Status: DC

## 2016-01-21 MED ORDER — TRUE METRIX METER W/DEVICE KIT: PACK | Status: DC

## 2016-01-21 MED ORDER — METFORMIN HCL 1000 MG PO TABS: 1000.0000 mg | ORAL_TABLET | Freq: Two times a day (BID) | ORAL | Status: DC

## 2016-01-21 MED FILL — TRUE METRIX TEST STRIP: 25 days supply | Qty: 100 | Fill #0

## 2016-01-21 MED FILL — !TRUE METRIX BLOOD GLUCOSE: 1 days supply | Qty: 1 | Fill #0

## 2016-01-21 MED FILL — TRUEplus LANCETS 28G MISC: 25 days supply | Qty: 100 | Fill #0

## 2016-01-21 NOTE — ED Notes (Signed)
Needs refills on bp meds and metformin no other issues or c/o

## 2016-01-21 NOTE — ED Notes (Signed)
Patient denies any problems at this time.   Patient states he's getting ready to run out of bp medication and diabetic medication.  Patient states "[facility]  I don't have transportation.  My local hospital won't see me cause they [facility]  I just can't get to appointments some times".

## 2016-01-21 NOTE — ED Provider Notes (Signed)
CSN: [id number]     Arrival date & time 01/21/16  1312 History  By signing my name below, I, Jasmyn B. Alexander, attest that this documentation has been prepared under the direction and in the presence of Kathryn Cosby Y Thedore Pickel, [place]-C.  Electronically Signed: Gillis EndsJasmyn B. Lyn HollingsheadAlexander, ED Scribe. 01/21/2016. 1:45 PM.     Chief Complaint  Patient presents with  . Medication Refill    The history is provided by the patient. No language interpreter was used.   HPI Comments: [name] is a 55 y.o. male with PMHx of HTN, DM, and DVT who presents to the Emergency Department requesting medication refill of Toprol and Metformin. Pt is known [facility], in order to obtain medication refills. He reports that [facility], but since he has missed so many appointments he was dropped from clinic. Pt states he does not have any transportation. Neighbor brought him [facility] today. He is unclear why he came to the ED instead of [facility] today. He states he has 2 days left of Toprol. 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 Pt has no complaints with any associated symptoms at this time. Denies any complaints PTA. Denies chest pain, SOB, weakness, numbness, tingling.  Past Medical History  Diagnosis Date  . Hypertension   . Diabetes mellitus   . DVT (deep venous thrombosis) (HCC)   . Blood clot in vein    Past Surgical History  Procedure Laterality Date  . Appendectomy    . Vein ligation and stripping      of left leg   Family History  Problem Relation Age of Onset  . Heart disease Mother   . Diabetes Father   . Heart disease Father    Social History  Substance Use Topics  . Smoking status: Current Every Day Smoker -- 1.00 packs/day    Types: Cigarettes  . Smokeless tobacco: None  . Alcohol Use: No    Review of Systems 10 Systems reviewed and all are negative for acute change except as noted in the  HPI.  Allergies  Erythromycin base; Methocarbamol; and Sulfa antibiotics  Home Medications   Prior to Admission medications   Medication Sig Start Date End Date Taking? Authorizing Provider  amoxicillin (AMOXIL) 500 MG capsule Take 1 capsule (500 mg total) by mouth 3 (three) times daily. 07/21/15   Christella ScheuermannEmma V Lawrence, PA-C  Blood Glucose Monitoring Suppl (RELION PRIME MONITOR) DEVI Use as instructed by physician 05/29/13   Richarda OverlieNayana Abrol, MD  gemfibrozil (LOPID) 600 MG tablet Take 1 tablet (600 mg total) by mouth 2 (two) times daily before a meal. 05/29/13   Richarda OverlieNayana Abrol, MD  lidocaine (XYLOCAINE) 2 % solution Use as directed 20 mLs in the mouth or throat as needed for mouth pain. 07/21/15   Christella ScheuermannEmma V Lawrence, PA-C  metFORMIN (GLUCOPHAGE) 500 MG tablet Take 2 tablets (1,000 mg total) by mouth 2 (two) times daily with a meal. 10/30/15 10/29/16  Evangeline Dakinharles M Beers, PA-C  metFORMIN (GLUCOPHAGE) 500 MG tablet Take 1 tablet (500 mg total) by mouth 2 (two) times daily with a meal. 12/25/15 12/24/16  Jenise V Bacon Menshew, PA-C  metoprolol (LOPRESSOR) 50 MG tablet Take 1 tablet (50 mg total) by mouth 2 (two) times daily. 12/17/14 12/24/16  Chinita Pesterari B Triplett, FNP  metoprolol (LOPRESSOR) 50 MG tablet Take 1 tablet (50 mg total) by mouth daily. 12/25/15 12/24/16  Jenise V Bacon Menshew, PA-C  Vitamin D, Ergocalciferol, (DRISDOL) 50000 UNITS CAPS capsule Take 1 capsule (50,000 Units total) by mouth every 7 (seven) days. 05/24/13   Richarda Overlie, MD   BP 116/87 mmHg  Pulse 73  Temp(Src) 98.2 F (36.8 C) (Oral)  Resp 18  SpO2 100% Physical Exam  Constitutional: He is oriented to person, place, and time. He appears well-developed and well-nourished. No distress.  HENT:  Head: Normocephalic and atraumatic.  Eyes: Conjunctivae are normal.  Cardiovascular: Normal rate.   Pulmonary/Chest: Effort normal.  Abdominal: He exhibits no distension.  Neurological: He is alert and oriented to person, place, and time.  Skin: Skin  is warm and dry.  Psychiatric: He has a normal mood and affect.  Nursing note and vitals reviewed.  ED Course  Procedures (including critical care time) DIAGNOSTIC STUDIES: Oxygen Saturation is 100% on RA, normal by my interpretation.    COORDINATION OF CARE: 1:45 PM-Discussed treatment plan which includes follow-up with [facility] clinic with pt at bedside and pt agreed to plan.   MDM   Final diagnoses:  Encounter for medication refill    Pt is a non-toxic appearing 55 y.o. male who presents to the ED for refills of his metoprolol and metformin. EMR review reveals he has been seen at [facility] and surrounding facilities many times for these refills, more recently being discharged without refill has he has not followed up with primary care in over two years. There is documentation of him calling requesting refills and being declined until he is seen for follow up. I had a long discussion with him and he has many excuses why he cannot get to his clinic appointments that case management at [facility] and [facility] have helped arrange. I explained to him that his regular ED appearances for med refills is an inappropriate use of emergency departments. I encouraged him to call [facility] now or go directly there to see if they could see him today, or if they could set up an appointment soon. I encouraged him to use public transporation such as the bus system. Pt states he does not like waiting for the bus. No emergent condition is present at this time nad pt is stable for discharge.  I personally performed the services described in this documentation, which was scribed in my presence. The recorded information has been reviewed and is accurate.   Carlene Coria, PA-C 01/21/16 2029  Maia Plan, MD 01/22/16 [phone]

## 2016-01-21 NOTE — Discharge Instructions (Signed)
We cannot refill your medications today. You need to go see a primary care provider. Go to [facility] right now to see if they can see you today. Emergency Departments are not appropriate places to get medication refills for your chronic blood pressure and diabetes medicines.

## 2016-01-21 NOTE — Progress Notes (Signed)
[name], is a 55 y.o. male  SJG:[id number]  MLY:[id number]  DOB - 1961/05/21  Subjective:  Chief Complaint and HPI: [name] is a 55 y.o. male here today for medication refills.  He was previously a patient here, but hasn't been seen here in quite some time.  He has been going to the ED for med RF and not having regular care or lab work.    Diabetes:  He admits to poor diet.  Denies S/Sx of hyper/hypoglycemia.  Doesn't check sugar at home.  Meter is broken.  He says he has been taking metformin 556m 1/2-1 tab tid for the last few months.  Sometimes he only takes half doses to make his meds last longer.  Htn:  He says he has been on metoprolol 565mbid for more than 10 years.  He is compliant with this.    He does not have any complaints today.   ROS:   Constitutional:  No f/c, No night sweats, No unexplained weight loss. EENT:  No vision changes, No blurry vision, No hearing changes. No mouth, throat, or ear problems.  Respiratory: No cough, No SOB Cardiac: No CP, no palpitations GI:  No abd pain, No N/V/D. GU: No Urinary s/sx Musculoskeletal: No joint pain Neuro: No headache, no dizziness, no motor weakness.  Skin: No rash Endocrine:  No polydipsia. No polyuria.  Psych: Denies SI/HI  No problems updated.  ALLERGIES: Allergies  Allergen Reactions  . Erythromycin Base Anxiety  . Methocarbamol Anxiety and Other (See Comments)    Stomach cramping, weakness  . Sulfa Antibiotics Nausea And Vomiting and Other (See Comments)    Cramping in stomach and hyperventilate.     PAST MEDICAL HISTORY: Past Medical History  Diagnosis Date  . Hypertension   . Diabetes mellitus   . DVT (deep venous thrombosis) ([facility]CO[facility]ai  . Blood clot in vein     MEDICATIONS AT HOME: Prior to Admission medications   Medication Sig Start Date End Date Taking? Authorizing Provider  Blood Glucose Monitoring Suppl (TRUE METRIX METER) w/Device KIT Check blood sugar fasting in the morning, 1 hour  after lunch, and at bedtime 01/21/16   AnArgentina DonovanPA-C  gemfibrozil (LOPID) 600 MG tablet Take 1 tablet (600 mg total) by mouth 2 (two) times daily before a meal. 05/29/13   NaReyne DumasMD  glucose blood (TRUE METRIX BLOOD GLUCOSE TEST) test strip Use as instructed 01/21/16   AnArgentina DonovanPA-C  lidocaine (XYLOCAINE) 2 % solution Use as directed 20 mLs in the mouth or throat as needed for mouth pain. 07/21/15   EmHarvest DarkPA-C  metFORMIN (GLUCOPHAGE) 1000 MG tablet Take 1 tablet (1,000 mg total) by mouth 2 (two) times daily with a meal. 01/21/16   AnArgentina DonovanPA-C  metoprolol (LOPRESSOR) 50 MG tablet Take 1 tablet (50 mg total) by mouth 2 (two) times daily. 01/21/16 01/28/18  AnArgentina DonovanPA-C  TRUEPLUS LANCETS 28G MISC Check blood sugar tid 01/21/16   AnArgentina DonovanPA-C  Vitamin D, Ergocalciferol, (DRISDOL) 50000 UNITS CAPS capsule Take 1 capsule (50,000 Units total) by mouth every 7 (seven) days. 05/24/13   NaReyne DumasMD     Objective:  EXAM: Danley Dankeritals:   01/21/16 1446  BP: 123/79  Pulse: 80  Temp: 97.5 F (36.4 C)  TempSrc: Oral  Resp: 16  Weight: 200 lb (90.719 kg)  SpO2: 97%    General appearance : A&OX3. NAD. Non-toxic-appearing  HEENT: Atraumatic and Normocephalic.  PERRLA. EOM intact.  Neck: supple, no JVD. No cervical lymphadenopathy. No thyromegaly Chest/Lungs:  Breathing-non-labored, Good air entry bilaterally, breath sounds normal without rales, rhonchi, or wheezing  CVS: S1 S2 regular, no murmurs, gallops, rubs  Extremities: Bilateral Lower Ext shows no edema, both legs are warm to touch with = pulse throughout Neurology:  CN II-XII grossly intact, Non focal.   Psych:  TP linear. J/I WNL. Normal speech. Appropriate eye contact and affect.  Skin:  No Rash  Data Review Lab Results  Component Value Date   HGBA1C 13.5 01/21/2016   HGBA1C 7.6 05/08/2013   HGBA1C 8.8* 12/05/2012     Assessment & Plan   1. Uncontrolled type 2  diabetes mellitus with ketoacidosis without coma, without long-term current use of insulin ([place]) Discussed proper diabetic diet.  Will assist with new meter.  Must check blood sugars tid(fasting, 1 hr after lunch and bedtime for now) Increase and take whole dose- metFORMIN (GLUCOPHAGE) 1000 MG tablet; Take 1 tablet (1,000 mg total) by mouth 2 (two) times daily with a meal.  Dispense: 180 tablet; Refill: 1 - Comprehensive metabolic panel - TSH - CBC with Differential/Platelet - Blood Glucose Monitoring Suppl (TRUE METRIX METER) w/Device KIT; Check blood sugar fasting in the morning, 1 hour after lunch, and at bedtime  Dispense: 1 kit; Refill: 0 - glucose blood (TRUE METRIX BLOOD GLUCOSE TEST) test strip; Use as instructed  Dispense: 100 each; Refill: 12 - TRUEPLUS LANCETS 28G MISC; Check blood sugar tid  Dispense: 100 each; Refill: 2 - Glucose (CBG) - HgB A1c Poor compliance is an issue  2. Essential hypertension Controlled. Continue current regimen - metoprolol (LOPRESSOR) 50 MG tablet; Take 1 tablet (50 mg total) by mouth 2 (two) times daily.  Dispense: 60 tablet; Refill: 11 - Comprehensive metabolic panel - TSH - CBC with Differential/Platelet  3. Vitamin D deficiency - Vitamin D, 25-hydroxy  Patient have been counseled extensively about nutrition and exercise  Return in about 3 weeks (around 02/11/2016) for diabetes counseling with Nicoletta Ba.  The patient was given clear instructions to go to ER or return to medical center if symptoms don't improve, worsen or new problems develop. The patient verbalized understanding. The patient was told to call to get lab results if they haven't heard anything in the next week.     Freeman Caldron, PA-C [facility] [place], [place]   01/21/2016, 5:37 PM

## 2016-01-21 NOTE — Progress Notes (Signed)
Patient here for f/u Dm/HTN, need medication refills. CBG 286mg /dl. HbA1c: 13.5% Richard KennedyKim Vidyuth Belsito, RN, BSN

## 2016-01-22 ENCOUNTER — Telehealth: Payer: Self-pay

## 2016-01-22 LAB — VITAMIN D 25 HYDROXY (VIT D DEFICIENCY, FRACTURES): Vit D, 25-Hydroxy: 24 ng/mL — ABNORMAL LOW (ref 30–100)

## 2016-01-22 NOTE — Telephone Encounter (Signed)
Clld pt - x 10 rings no answer, no voicemail setup to leave message.

## 2016-01-22 NOTE — Telephone Encounter (Signed)
-----   Message from Anders SimmondsAngela M McClung, [place]-C sent at 01/22/2016  9:14 AM EDT ----- Please call patient.  His thyroid, kidney, and liver function look ok.  His vitamin D is still a little low but this is not contributing to his diabetes.  Take 2000units vit D daily. [facility].  Record his sugars as we discussed and RTC in 3 weeks.   Thanks,  Georgian CoAngela McClung, PA-C

## 2016-01-24 ENCOUNTER — Encounter: Payer: Self-pay | Admitting: Emergency Medicine

## 2016-01-24 ENCOUNTER — Emergency Department
Admission: EM | Admit: 2016-01-24 | Discharge: 2016-01-24 | Disposition: A | Discharge status: Home / Self Care | Payer: Self-pay | Attending: Emergency Medicine | Admitting: Emergency Medicine

## 2016-01-24 DIAGNOSIS — R3589 Other polyuria: Secondary | ICD-10-CM

## 2016-01-24 DIAGNOSIS — R739 Hyperglycemia, unspecified: Secondary | ICD-10-CM

## 2016-01-24 DIAGNOSIS — I10 Essential (primary) hypertension: Secondary | ICD-10-CM | POA: Insufficient documentation

## 2016-01-24 DIAGNOSIS — F17210 Nicotine dependence, cigarettes, uncomplicated: Secondary | ICD-10-CM | POA: Insufficient documentation

## 2016-01-24 DIAGNOSIS — R358 Other polyuria: Secondary | ICD-10-CM

## 2016-01-24 DIAGNOSIS — R682 Dry mouth, unspecified: Secondary | ICD-10-CM

## 2016-01-24 DIAGNOSIS — E1165 Type 2 diabetes mellitus with hyperglycemia: Secondary | ICD-10-CM | POA: Insufficient documentation

## 2016-01-24 DIAGNOSIS — R631 Polydipsia: Secondary | ICD-10-CM

## 2016-01-24 DIAGNOSIS — Z79899 Other long term (current) drug therapy: Secondary | ICD-10-CM | POA: Insufficient documentation

## 2016-01-24 DIAGNOSIS — F419 Anxiety disorder, unspecified: Secondary | ICD-10-CM | POA: Insufficient documentation

## 2016-01-24 DIAGNOSIS — Z7984 Long term (current) use of oral hypoglycemic drugs: Secondary | ICD-10-CM | POA: Insufficient documentation

## 2016-01-24 LAB — URINALYSIS COMPLETE WITH MICROSCOPIC (ARMC ONLY)
Bacteria, UA: NONE SEEN
Bilirubin Urine: NEGATIVE
Glucose, UA: >500 mg/dL — AB
Hgb urine dipstick: NEGATIVE
Leukocytes, UA: NEGATIVE
Nitrite: NEGATIVE
Protein, ur: 30 mg/dL — AB
Specific Gravity, Urine: 1.028 (ref 1.005–1.030)
Squamous Epithelial / HPF: NONE SEEN
pH: 5 (ref 5.0–8.0)

## 2016-01-24 LAB — BASIC METABOLIC PANEL WITH GFR
Anion gap: 8 (ref 5–15)
BUN: 18 mg/dL (ref 6–20)
CO2: 24 mmol/L (ref 22–32)
Calcium: 9.9 mg/dL (ref 8.9–10.3)
Chloride: 102 mmol/L (ref 101–111)
Creatinine, Ser: 0.75 mg/dL (ref 0.61–1.24)
GFR calc Af Amer: >60 mL/min
GFR calc non Af Amer: >60 mL/min
Glucose, Bld: 278 mg/dL — ABNORMAL HIGH (ref 65–99)
Potassium: 4.3 mmol/L (ref 3.5–5.1)
Sodium: 134 mmol/L — ABNORMAL LOW (ref 135–145)

## 2016-01-24 LAB — CBC
HEMATOCRIT: 51.9 % (ref 40.0–52.0)
Hemoglobin: 18.2 g/dL — ABNORMAL HIGH (ref 13.0–18.0)
MCH: 32.2 pg (ref 26.0–34.0)
MCHC: 35.1 g/dL (ref 32.0–36.0)
MCV: 91.7 fL (ref 80.0–100.0)
Platelets: 108 10*3/uL — ABNORMAL LOW (ref 150–440)
RBC: 5.66 MIL/uL (ref 4.40–5.90)
RDW: 13.1 % (ref 11.5–14.5)
WBC: 8.3 10*3/uL (ref 3.8–10.6)

## 2016-01-24 LAB — GLUCOSE, CAPILLARY: Glucose-Capillary: 276 mg/dL — ABNORMAL HIGH (ref 65–99)

## 2016-01-24 NOTE — ED Notes (Signed)
Pt c/o difficulty regulating glucose at home. States it has been running high and has increased weakness, dry mouth and some blurred vision. Pt A&O . Denies any noncompliance of meds.

## 2016-01-24 NOTE — ED Provider Notes (Signed)
[facility] Emergency Department Provider Note  ____________________________________________  Time seen: Approximately 4:27 PM  I have reviewed the triage vital signs and the nursing notes.   HISTORY  Chief Complaint Hyperglycemia    HPI [name] is a 55 y.o. male with a history of NIDDM, HTN, anxiety,here for polyuria, polydipsia, hyperglycemia, and generalized weakness. The patient reports that approximately one week ago he went to see his community physician in [place] and received Advil, or with test strips. This is the first time he has tested his blood in several years, and has noticed that his blood sugars run in the 2 and 300s. He has associated polyuria, polydipsia, dry mouth, and generalized weakness, but denies any abdominal pain, nausea or vomiting or diarrhea. He has not had any chest pain, palpitations, shortness of breath, cough or cold symptoms, dysuria, fever or chills. He does report that he had his metformin dose doubled from 500 twice a day to 1000 twice a day. He does not adhere to a diabetic diet. He read about DKA ondansetron was concerned about this. He does note that he was previously stabilized on BuSpar for his anxiety, which he has been off for some time, and is requesting a refill for his prescription although he states his symptoms are not significantly worse.   Past Medical History:  Diagnosis Date  . Blood clot in vein   . Diabetes mellitus   . DVT (deep venous thrombosis) (HCC)   . Hypertension     Patient Active Problem List   Diagnosis Date Noted  . Thrombocytopenia, unspecified (HCC) 12/06/2012  . Diabetic neuropathy (HCC) 12/05/2012  . Smoker 12/05/2012  . Dyslipidemia 12/05/2012  . Type II or unspecified type diabetes mellitus without mention of complication, uncontrolled 05/11/2011  . Hypertension 05/11/2011    Past Surgical History:  Procedure Laterality Date  . APPENDECTOMY    . VEIN LIGATION AND  STRIPPING     of left leg    Current Outpatient Rx  . Order #: [id number] Class: Normal  . Order #: [id number] Class: Normal  . Order #: [id number] Class: Normal  . Order #: [id number] Class: Print  . Order #: [id number] Class: Normal  . Order #: [id number] Class: Print  . Order #: [id number] Class: Normal  . Order #: [id number] Class: Normal    Allergies Erythromycin base; Methocarbamol; and Sulfa antibiotics  Family History  Problem Relation Age of Onset  . Heart disease Mother   . Diabetes Father   . Heart disease Father     Social History Social History  Substance Use Topics  . Smoking status: Current Every Day Smoker    Packs/day: 1.00    Types: Cigarettes  . Smokeless tobacco: Not on file  . Alcohol use No    Review of Systems Constitutional: No fever/chills. No lightheadedness or syncope. Eyes: No visual changes. ENT: No sore throat. No congestion or rhinorrhea. Cardiovascular: Denies chest pain. Denies palpitations. Respiratory: Denies shortness of breath.  No cough. Gastrointestinal: No abdominal pain.  No nausea, no vomiting.  No diarrhea.  No constipation. Genitourinary: Negative for dysuria. Musculoskeletal: Negative for back pain. Skin: Negative for rash. Neurological: Negative for headaches. No focal numbness, tingling or weakness.  Psychiatric:Positive anxiety Endocrine:Positive hyperglycemia. Positive polyuria, polydipsia, dry mouth.  10-point ROS otherwise negative.  ____________________________________________   PHYSICAL EXAM:  VITAL SIGNS: ED Triage Vitals  Enc Vitals Group     BP 01/24/16 1128 104/82     Pulse Rate 01/24/16 1126 83     Resp 01/24/16 1126  16     Temp 01/24/16 1126 98.1 F (36.7 C)     Temp Source 01/24/16 1126 Oral     SpO2 01/24/16 1126 96 %     Weight 01/24/16 1126 200 lb (90.7 kg)     Height 01/24/16 1126 5\' 11"  (1.803 m)     Head Circumference --      Peak Flow --      Pain Score 01/24/16 1128 5     Pain Loc --      Pain  Edu? --      Excl. in GC? --     Constitutional: Alert and oriented. Well appearing and in no acute distress. Answers questions appropriately. Eyes: Conjunctivae are normal.  EOMI. No scleral icterus. Head: Atraumatic. Nose: No congestion/rhinnorhea. Mouth/Throat: Mucous membranes are moist.  Neck: No stridor.  Supple.  No meningismus. Cardiovascular: Normal rate, regular rhythm. No murmurs, rubs or gallops.  Respiratory: Normal respiratory effort.  No accessory muscle use or retractions. Lungs CTAB.  No wheezes, rales or ronchi. Gastrointestinal: Soft, nontender and nondistended.  No guarding or rebound.  No peritoneal signs. Musculoskeletal: No LE edema. No ttp in the calves or palpable cords.  Negative Homan's sign. Neurologic:  A&Ox3.  Speech is clear.  Face and smile are symmetric.  EOMI.  Moves all extremities well. Skin:  Skin is warm, dry and intact. No rash noted. Psychiatric: Mood and affect are normal. Speech and behavior are normal.  Normal judgement. No evidence of acute anxiety at this time.  ____________________________________________   LABS (all labs ordered are listed, but only abnormal results are displayed)  Labs Reviewed  BASIC METABOLIC PANEL - Abnormal; Notable for the following:       Result Value   Sodium 134 (*)    Glucose, Bld 278 (*)    All other components within normal limits  CBC - Abnormal; Notable for the following:    Hemoglobin 18.2 (*)    Platelets 108 (*)    All other components within normal limits  URINALYSIS COMPLETEWITH MICROSCOPIC ([facility] ONLY) - Abnormal; Notable for the following:    Color, Urine YELLOW (*)    APPearance CLEAR (*)    Glucose, UA >500 (*)    Ketones, ur TRACE (*)    Protein, ur 30 (*)    All other components within normal limits  GLUCOSE, CAPILLARY - Abnormal; Notable for the following:    Glucose-Capillary 276 (*)    All other components within normal limits  CBG MONITORING, ED    ____________________________________________  EKG   Not Indicated ____________________________________________  RADIOLOGY  No results found.  ____________________________________________   PROCEDURES  Procedure(s) performed: None  Procedures  Critical Care performed: No ____________________________________________   INITIAL IMPRESSION / ASSESSMENT AND PLAN / ED COURSE  Pertinent labs & imaging results that were available during my care of the patient were reviewed by me and considered in my medical decision making (see chart for details).  55 y.o. male with a history of NIDDM, HTN, anxiety presenting with hyperglycemia and associated symptoms, as well as anxiety. The patient does have hyperglycemia here but he has a normal anion gap and is not in DKA. He has some ketonuria and glucose in his urine which are consistent with his hyperglycemia. He does not appear to have any infectious or other cause for hyperglycemia worse than usual; it is likely that he has had a long history of hyperglycemia but has not noted because he did not have a glucometer at  home. It is likely that his diabetes has progressed, and in addition he does not stick to a diabetic diet. In addition, he has a history of anxiety, but is not have any red flag symptoms or acute emergency psychiatrically today. I have encouraged him to make a follow-up appointment with his primary care physician for sooner than August 10, which is when he is scheduled at this time. He understands that the emergency department is not the best location to get refills for chronic medications, and that he will need to do this with his primary care physician. Plan discharge. Return precautions and follow-up instructions were discussed.  ____________________________________________  FINAL CLINICAL IMPRESSION(S) / ED DIAGNOSES  Final diagnoses:  Hyperglycemia  Anxiety  Polyuria  Polydipsia  Dry mouth    Clinical Course       NEW MEDICATIONS STARTED DURING THIS VISIT:  New Prescriptions   No medications on file      Rockne Menghini, MD 01/24/16 [phone]

## 2016-01-24 NOTE — ED Triage Notes (Signed)
Pt reports blood sugar at home was 362. Has had increased urination and thirst. Pt sweaty and shirt wet but pt walked to ED

## 2016-01-24 NOTE — ED Triage Notes (Signed)
Pt also c/o mild headache

## 2016-01-24 NOTE — Discharge Instructions (Signed)
Please drink plenty of fluids and stay well-hydrated. Please continue your metformin as prescribed, and make sure your primary care physician know about your continued elevated blood sugars. Please also talked your primary care physician about your anxiety. These adhere to a diabetic diet.  Return to the emergency department if you  develop nausea or vomiting, lightheadedness or fainting, fever, or any other symptoms concerning to you.

## 2016-01-26 NOTE — Telephone Encounter (Signed)
Clld pt - advsd of lab results; emphasized to purchase OTC Vit D taking 2000 units oral daily; recording blood glucose reading to bring w/him in 3 weeks at f/u appt. Pt stated he understood and would.

## 2016-01-26 NOTE — Telephone Encounter (Signed)
-----   Message from Anders Simmonds, [place] sent at 01/22/2016  9:14 AM EDT ----- Please call patient.  His thyroid, kidney, and liver function look ok.  His vitamin D is still a little low but this is not contributing to his diabetes.  Take 2000units vit D daily. He can buy this at [facility] which is likely the least expensive.  Record his sugars as we discussed and RTC in 3 weeks.   Thanks,  Georgian Co, PA-C

## 2016-01-27 ENCOUNTER — Emergency Department
Admission: EM | Admit: 2016-01-27 | Discharge: 2016-01-27 | Disposition: A | Discharge status: Home / Self Care | Payer: Self-pay | Attending: Emergency Medicine | Admitting: Emergency Medicine

## 2016-01-27 DIAGNOSIS — Z86718 Personal history of other venous thrombosis and embolism: Secondary | ICD-10-CM | POA: Insufficient documentation

## 2016-01-27 DIAGNOSIS — Z79899 Other long term (current) drug therapy: Secondary | ICD-10-CM | POA: Insufficient documentation

## 2016-01-27 DIAGNOSIS — E785 Hyperlipidemia, unspecified: Secondary | ICD-10-CM | POA: Insufficient documentation

## 2016-01-27 DIAGNOSIS — F17210 Nicotine dependence, cigarettes, uncomplicated: Secondary | ICD-10-CM | POA: Insufficient documentation

## 2016-01-27 DIAGNOSIS — I10 Essential (primary) hypertension: Secondary | ICD-10-CM | POA: Insufficient documentation

## 2016-01-27 DIAGNOSIS — E1165 Type 2 diabetes mellitus with hyperglycemia: Secondary | ICD-10-CM | POA: Insufficient documentation

## 2016-01-27 DIAGNOSIS — Z7984 Long term (current) use of oral hypoglycemic drugs: Secondary | ICD-10-CM | POA: Insufficient documentation

## 2016-01-27 DIAGNOSIS — R739 Hyperglycemia, unspecified: Secondary | ICD-10-CM

## 2016-01-27 LAB — CBC
HEMATOCRIT: 46.5 % (ref 40.0–52.0)
HEMOGLOBIN: 16.3 g/dL (ref 13.0–18.0)
MCH: 32.4 pg (ref 26.0–34.0)
MCHC: 35.1 g/dL (ref 32.0–36.0)
MCV: 92.4 fL (ref 80.0–100.0)
Platelets: 91 10*3/uL — ABNORMAL LOW (ref 150–440)
RBC: 5.03 MIL/uL (ref 4.40–5.90)
RDW: 12.9 % (ref 11.5–14.5)
WBC: 6 10*3/uL (ref 3.8–10.6)

## 2016-01-27 LAB — GLUCOSE, CAPILLARY
GLUCOSE-CAPILLARY: 467 mg/dL — AB (ref 65–99)
GLUCOSE-CAPILLARY: 355 mg/dL — AB (ref 65–99)
Glucose-Capillary: 259 mg/dL — ABNORMAL HIGH (ref 65–99)

## 2016-01-27 LAB — COMPREHENSIVE METABOLIC PANEL
ALT: 16 U/L — ABNORMAL LOW (ref 17–63)
ANION GAP: 10 (ref 5–15)
AST: 22 U/L (ref 15–41)
Albumin: 3.8 g/dL (ref 3.5–5.0)
Alkaline Phosphatase: 85 U/L (ref 38–126)
BILIRUBIN TOTAL: 0.6 mg/dL (ref 0.3–1.2)
BUN: 16 mg/dL (ref 6–20)
CALCIUM: 8.8 mg/dL — AB (ref 8.9–10.3)
CO2: 23 mmol/L (ref 22–32)
Chloride: 102 mmol/L (ref 101–111)
Creatinine, Ser: 0.82 mg/dL (ref 0.61–1.24)
GFR calc Af Amer: >60 mL/min (ref >60)
GFR calc non Af Amer: >60 mL/min (ref >60)
GLUCOSE: 486 mg/dL — AB (ref 65–99)
Potassium: 4.6 mmol/L (ref 3.5–5.1)
Sodium: 135 mmol/L (ref 135–145)
TOTAL PROTEIN: 6.3 g/dL — AB (ref 6.5–8.1)

## 2016-01-27 MED ORDER — INSULIN ASPART 100 UNIT/ML ~~LOC~~ SOLN
5.0000 [IU] | Freq: Once | SUBCUTANEOUS | Status: AC
  Administered 2016-01-27: 5 [IU] via INTRAVENOUS
  Filled 2016-01-27: qty 5

## 2016-01-27 MED ORDER — INSULIN ASPART 100 UNIT/ML ~~LOC~~ SOLN
5.0000 [IU] | Freq: Once | SUBCUTANEOUS | Status: AC
  Administered 2016-01-27: 5 [IU] via SUBCUTANEOUS
  Filled 2016-01-27: qty 5

## 2016-01-27 NOTE — ED Notes (Signed)
Pt discharged to home.  Family member driving.  Discharge instructions reviewed.  Verbalized understanding.  No questions or concerns at this time.  Teach back verified.  Pt in NAD.  No items left in ED.   

## 2016-01-27 NOTE — ED Triage Notes (Signed)
Pt arrives here via [facility] from home with reports of elevated blood sugars over the last several months  - pt reports a glucose of >500 this am after a med adjustment a few weeks ago  His metformin was taken 500mg  tid and it was changed to 1000mg  bid   Pt reports feeling weak and just not himself since his sugars are elevated

## 2016-01-27 NOTE — ED Provider Notes (Signed)
[facility] Emergency Department Provider Note   ____________________________________________    I have reviewed the triage vital signs and the nursing notes.   HISTORY  Chief Complaint Hyperglycemia     HPI [name] is a 55 y.o. male who presents with hyperglycemia. Patient reports she has had difficulty controlling his blood glucose despite the fact that his physician has increased his metformin significantly. He is quite anxious about his glucose being elevated. He reports it was approximate 500 this morning. He denies physical complaints. He reports he feels quite well. No fevers or chills.    Past Medical History:  Diagnosis Date  . Blood clot in vein   . Diabetes mellitus   . DVT (deep venous thrombosis) ([place])   . Hypertension     Patient Active Problem List   Diagnosis Date Noted  . Thrombocytopenia, unspecified ([place]) 12/06/2012  . Diabetic neuropathy ([place]) 12/05/2012  . Smoker 12/05/2012  . Dyslipidemia 12/05/2012  . Type II or unspecified type diabetes mellitus without mention of complication, uncontrolled 05/11/2011  . Hypertension 05/11/2011    Past Surgical History:  Procedure Laterality Date  . APPENDECTOMY    . VEIN LIGATION AND STRIPPING     of left leg    Prior to Admission medications   Medication Sig Start Date End Date Taking? Authorizing Provider  gemfibrozil (LOPID) 600 MG tablet Take 1 tablet (600 mg total) by mouth 2 (two) times daily before a meal. 05/29/13  Yes Reyne Dumas, MD  metFORMIN (GLUCOPHAGE) 1000 MG tablet Take 1 tablet (1,000 mg total) by mouth 2 (two) times daily with a meal. 01/21/16  Yes Dionne Bucy McClung, PA-C  metoprolol (LOPRESSOR) 50 MG tablet Take 1 tablet (50 mg total) by mouth 2 (two) times daily. 01/21/16 01/28/18 Yes Argentina Donovan, PA-C  Vitamin D, Ergocalciferol, (DRISDOL) 50000 UNITS CAPS capsule Take 1 capsule (50,000 Units total) by mouth every 7 (seven) days. 05/24/13  Yes Reyne Dumas, MD  Blood Glucose Monitoring Suppl (TRUE METRIX METER) w/Device KIT Check blood sugar fasting in the morning, 1 hour after lunch, and at bedtime 01/21/16   Argentina Donovan, PA-C  glucose blood (TRUE METRIX BLOOD GLUCOSE TEST) test strip Use as instructed 01/21/16   Argentina Donovan, PA-C  TRUEPLUS LANCETS 28G MISC Check blood sugar tid 01/21/16   Argentina Donovan, PA-C  .   Allergies Erythromycin base; Methocarbamol; and Sulfa antibiotics  Family History  Problem Relation Age of Onset  . Heart disease Mother   . Diabetes Father   . Heart disease Father     Social History Social History  Substance Use Topics  . Smoking status: Current Every Day Smoker    Packs/day: 1.00    Types: Cigarettes  . Smokeless tobacco: Not on file  . Alcohol use No    Review of Systems  Constitutional: No fever/chills Eyes: No visual changes.  ENT: No sore throat. Cardiovascular: Denies chest pain. Respiratory: Denies shortness of breathOr cough Gastrointestinal: No nausea, no vomiting.   Genitourinary: Negative for dysuria. Musculoskeletal: Negative for back pain. Skin: Negative for rash. Neurological: Negative for headaches or weakness  10-point ROS otherwise negative.  ____________________________________________   PHYSICAL EXAM:  VITAL SIGNS: ED Triage Vitals  Enc Vitals Group     BP 01/27/16 1048 108/70     Pulse Rate 01/27/16 1048 76     Resp 01/27/16 1048 18     Temp 01/27/16 1048 97.7 F (36.5 C)  Temp Source 01/27/16 1048 Oral     SpO2 01/27/16 1048 99 %     Weight 01/27/16 1048 200 lb (90.7 kg)     Height 01/27/16 1048 '5\' 10"'  (1.778 m)     Head Circumference --      Peak Flow --      Pain Score 01/27/16 1050 0     Pain Loc --      Pain Edu? --      Excl. in [place]? --     Constitutional: Alert and oriented. No acute distress. Pleasant and interactive Eyes: Conjunctivae are normal.  Head: Atraumatic. Nose: No congestion/rhinnorhea. Mouth/Throat: Mucous  membranes are moist.   Neck:  Painless ROM Cardiovascular: Normal rate, regular rhythm. Grossly normal heart sounds.  Good peripheral circulation. Respiratory: Normal respiratory effort.  No retractions. Lungs CTAB. Gastrointestinal: Soft and nontender. No distention.   Genitourinary: deferred Musculoskeletal: No lower extremity tenderness nor edema.  Warm and well perfused Neurologic:  Normal speech and language. No gross focal neurologic deficits are appreciated.  Skin:  Skin is warm, dry and intact. No rash noted. Psychiatric: Mood and affect are normal. Speech and behavior are normal.  ____________________________________________   LABS (all labs ordered are listed, but only abnormal results are displayed)  Labs Reviewed  COMPREHENSIVE METABOLIC PANEL - Abnormal; Notable for the following:       Result Value   Glucose, Bld 486 (*)    Calcium 8.8 (*)    Total Protein 6.3 (*)    ALT 16 (*)    All other components within normal limits  CBC - Abnormal; Notable for the following:    Platelets 91 (*)    All other components within normal limits  GLUCOSE, CAPILLARY - Abnormal; Notable for the following:    Glucose-Capillary 467 (*)    All other components within normal limits  GLUCOSE, CAPILLARY - Abnormal; Notable for the following:    Glucose-Capillary 355 (*)    All other components within normal limits  GLUCOSE, CAPILLARY - Abnormal; Notable for the following:    Glucose-Capillary 259 (*)    All other components within normal limits  CBG MONITORING, ED  CBG MONITORING, ED   ____________________________________________  EKG  None ____________________________________________  RADIOLOGY  None ____________________________________________   PROCEDURES  Procedure(s) performed: No    Critical Care performed: No ____________________________________________   INITIAL IMPRESSION / ASSESSMENT AND PLAN / ED COURSE  Pertinent labs & imaging results that were  available during my care of the patient were reviewed by me and considered in my medical decision making (see chart for details).  Patient with elevated glucose but completely asymptomatic. We will treat with IV fluids and subcutaneous insulin and reevaluate.  Clinical Course   ----------------------------------------- 3:10 PM on 01/27/2016 -----------------------------------------  Patient's glucose is improved to 250. He will continue with his metformin and follow-up with his PCP tomorrow ____________________________________________   FINAL CLINICAL IMPRESSION(S) / ED DIAGNOSES  Final diagnoses:  Hyperglycemia      NEW MEDICATIONS STARTED DURING THIS VISIT:  New Prescriptions   No medications on file     Note:  This document was prepared using Dragon voice recognition software and may include unintentional dictation errors.    Lavonia Drafts, MD 01/27/16 [id number]

## 2016-01-28 ENCOUNTER — Telehealth: Payer: Self-pay | Admitting: General Practice

## 2016-01-28 NOTE — Telephone Encounter (Signed)
Pt. Called stating that his DM is very high and does not know what to do. Pt. Went to the ED yesterday and today his DM is still high. Pt. States he will return to the ED. He is also requesting his lab results. Please f/u.

## 2016-01-28 NOTE — Telephone Encounter (Signed)
Will forward to pcp

## 2016-01-29 ENCOUNTER — Ambulatory Visit: Payer: Self-pay | Attending: Internal Medicine | Admitting: Physician Assistant

## 2016-01-29 VITALS — BP 126/87 | HR 90 | Temp 98.0°F | Resp 16 | Wt 198.0 lb

## 2016-01-29 DIAGNOSIS — I10 Essential (primary) hypertension: Secondary | ICD-10-CM

## 2016-01-29 DIAGNOSIS — E0800 Diabetes mellitus due to underlying condition with hyperosmolarity without nonketotic hyperglycemic-hyperosmolar coma (NKHHC): Secondary | ICD-10-CM

## 2016-01-29 MED ORDER — INSULIN ASPART 100 UNIT/ML ~~LOC~~ SOLN: SUBCUTANEOUS | 99 refills | Status: DC

## 2016-01-29 MED ORDER — SYRINGE 30-35 ML 35 ML MISC: 1 refills | Status: DC

## 2016-01-29 MED FILL — TRUEPLUS SYR 1ML 31GX5/16: 31G X 5/16" | 30 days supply | Qty: 100 | Fill #0

## 2016-01-29 MED FILL — !NOVOLOG 100UNITS/ML VIAL: 100/ML | 30 days supply | Qty: 10 | Fill #0

## 2016-01-29 NOTE — Progress Notes (Signed)
Patient ID: [name], male   DOB: May 11, 1961, 55 y.o.   MRN: [id number]   [name], is a 55 y.o. male  [id number]  [id number]  DOB - [birth year]  Subjective:  Chief Complaint and HPI: [name] is a 55 y.o. male here today because he is concerned that his blood sugars are "not going down."  He has been in the ED over the last few months for medication RFs.  However, he was still not taking all his meds as prescribed.  When I saw him 1 week ago, he was taking metformin 522m bid sometimes but not daily.  I increased his metformin to 10036mbid which he reports taking as directed.  His A1C was 13.5 which shows his blood sugars have been running very high.  He expected that his blood sugars would go down immediately and has been to the ED for blood sugar of 278 on 01/24/2016 and 467 on 01/27/2016 respectively.  He is very nervous because his wife is ill and being moved into assisted living and he may be homeless soon.  He admits to being quite anxious and he is scared that many of his readings have still been in the 200-300 range even with the increased dose of metformin 1 week ago.  He admits to poor diet and a lot of processed foods.  He denies any s/sx of hyper/hypoglycemia today.  He brings in a log of his sugars since last week and his readings have been ~100-350 with an overall downward trend though still averaging >200.     ED notes reviewed.     ROS:   Constitutional:  No f/c, No night sweats, No unexplained weight loss. EENT:  No vision changes, No blurry vision, No hearing changes. No mouth, throat, or ear problems.  Respiratory: No cough, No SOB Cardiac: No CP, no palpitations GI:  No abd pain, No N/V/D. GU: No Urinary s/sx Musculoskeletal: No joint pain Neuro: No headache, no dizziness, no motor weakness.  Skin: No rash Endocrine:  No polydipsia. No polyuria.  Psych: Denies SI/HI  No problems updated.  ALLERGIES: Allergies  Allergen Reactions  . Erythromycin  Base Anxiety  . Methocarbamol Anxiety and Other (See Comments)    Stomach cramping, weakness  . Sulfa Antibiotics Nausea And Vomiting and Other (See Comments)    Cramping in stomach and hyperventilate.     PAST MEDICAL HISTORY: Past Medical History:  Diagnosis Date  . Blood clot in vein   . Diabetes mellitus   . [place]  . Hypertension     MEDICATIONS AT HOME: Prior to Admission medications   Medication Sig Start Date End Date Taking? Authorizing Provider  Blood Glucose Monitoring Suppl (TRUE METRIX METER) w/Device KIT Check blood sugar fasting in the morning, 1 hour after lunch, and at bedtime 01/21/16  Yes AnDionne BucycClung, PA-C  gemfibrozil (LOPID) 600 MG tablet Take 1 tablet (600 mg total) by mouth 2 (two) times daily before a meal. 05/29/13  Yes NaReyne DumasMD  glucose blood (TRUE METRIX BLOOD GLUCOSE TEST) test strip Use as instructed 01/21/16  Yes AnArgentina DonovanPA-C  metFORMIN (GLUCOPHAGE) 1000 MG tablet Take 1 tablet (1,000 mg total) by mouth 2 (two) times daily with a meal. 01/21/16  Yes AnDionne BucycClung, PA-C  metoprolol (LOPRESSOR) 50 MG tablet Take 1 tablet (50 mg total) by mouth 2 (two) times daily. 01/21/16 01/28/18 Yes AnArgentina DonovanPA-C  TRUEPLUS LANCETS 28G MISC Check  blood sugar tid 01/21/16  Yes Dionne Bucy Deneshia Zucker, PA-C  insulin aspart (NOVOLOG) 100 UNIT/ML injection Inject 10units if blood sugar is greater 300; Inject 15units if blood sugar is greater 400 01/29/16   Argentina Donovan, PA-C  Syringe, Disposable, (30-35CC SYRINGE) 35 ML MISC Use for injecting insulin 01/29/16   Argentina Donovan, PA-C  Vitamin D, Ergocalciferol, (DRISDOL) 50000 UNITS CAPS capsule Take 1 capsule (50,000 Units total) by mouth every 7 (seven) days. Patient not taking: Reported on 01/29/2016 05/24/13   Reyne Dumas, MD     Objective:  EXAM:   Vitals:   01/29/16 1501  BP: 126/87  Pulse: 90  Resp: 16  Temp: 98 F (36.7 C)  TempSrc: Oral  SpO2: 95%    Weight: 198 lb (89.8 kg)    General appearance : A&OX3. NAD. Non-toxic-appearing HEENT: Atraumatic and Normocephalic.  PERRLA. EOM intact.  TM clear B. Mouth-MMM, post pharynx WNL w/o erythema, No PND. Neck: supple, no JVD. No cervical lymphadenopathy. No thyromegaly Chest/Lungs:  Breathing-non-labored, Good air entry bilaterally, breath sounds normal without rales, rhonchi, or wheezing  CVS: S1 S2 regular, no murmurs, gallops, rubs  Extremities: Bilateral Lower Ext shows no edema, both legs are warm to touch with = pulse throughout Neurology:  CN II-XII grossly intact, Non focal.   Psych:  TP linear. J/I WNL. Normal speech. Appropriate eye contact and affect.  Skin:  No Rash  Data Review Lab Results  Component Value Date   HGBA1C 13.5 01/21/2016   HGBA1C 7.6 05/08/2013   HGBA1C 8.8 (H) 12/05/2012     Assessment & Plan  Diabetes-uncontrolled but improving.  I spent 1 hour face to face with him.  We discussed proper diabetic diet.  I gave him information and pamphlets.  We will start a plan to manage sugars >300 with Novolog. If CBG>300-10units of Novolog If CBG >400-15 units of Novolog If sugar remains high after insulin or is above 500 and he is symptomatic, he will go to the ED. He will more aggressively watch his diet and increase water intake.    Htn-controlled. Continue current regimen  Patient have been counseled extensively about nutrition and exercise  Return for keep appt with Nicoletta Ba 02/11/2016. for diabetic management.   The patient was given clear instructions to go to ER or return to medical center if symptoms don't improve, worsen or new problems develop. The patient verbalized understanding. The patient was told to call to get lab results if they haven't heard anything in the next week.     Freeman Caldron, PA-C [facility] and [place] [place], [place]   01/29/2016, 5:15 PM

## 2016-01-29 NOTE — Telephone Encounter (Signed)
Pt schedule an appointment with [name] for January 29, 2016 @330 .

## 2016-02-11 ENCOUNTER — Encounter: Payer: Self-pay | Admitting: Pharmacist

## 2016-02-11 ENCOUNTER — Telehealth: Payer: Self-pay | Admitting: Internal Medicine

## 2016-02-11 ENCOUNTER — Ambulatory Visit: Payer: Self-pay | Attending: Internal Medicine | Admitting: Pharmacist

## 2016-02-11 DIAGNOSIS — E1110 Type 2 diabetes mellitus with ketoacidosis without coma: Secondary | ICD-10-CM

## 2016-02-11 DIAGNOSIS — E1310 Other specified diabetes mellitus with ketoacidosis without coma: Secondary | ICD-10-CM

## 2016-02-11 MED ORDER — GLIMEPIRIDE 1 MG PO TABS: 1.0000 mg | ORAL_TABLET | Freq: Every day | ORAL | 2 refills | Status: DC

## 2016-02-11 NOTE — Telephone Encounter (Signed)
Pt called stating his blood sugar was at 212 this morning and then he went for a walk and it went up to 258  Pt states he is feeling weak and sick and does not know if he will be able to make it to his apptointment at 3 today but will try  Pt states he has insulin but he has not taken any  Pt does not have established PCP yet

## 2016-02-11 NOTE — Progress Notes (Signed)
    S:    Patient arrives in good spirits.  Presents for diabetes evaluation, education, and management at the request of Camera operatorAngela McClung/Dr. Hyman HopesJegede. Patient was referred on 01/29/16.  Patient reports adherence with medications.  Current diabetes medications include: metformin 1000 mg BID and has sliding scale Novolog but has not needed it. Of note, patient is afraid to use it due to hypoglycemia.   He does not have insurance however he has [facility]   O:  Lab Results  Component Value Date   HGBA1C 13.5 01/21/2016   There were no vitals filed for this visit.  Home fasting CBG: 150s-300s 2 hour post-prandial/random CBG: 200s  A/P: Diabetes longstanding currently UNcontrolled based on A1c of 13.5 and home CBGs. Patient reports hypoglycemic events and is able to verbalize appropriate hypoglycemia management plan. Patient reports adherence with medication. Control is suboptimal due to dietary indiscretion and sedentary lifestyle.  Continue metformin 1000 mg BID. Will hold off on using Novolog for now. Initiate glimepiride 1 mg daily (on the [facility] $4 list). Will start at a lower dose due to patient's concern for hypoglycemia. Will titrate up as needed. Instructed patient to meet with financial counseling for assistance with bills and to be able to start using our pharmacy so that we can get some medications through the [facility] program if needed. Reviewed the medication with him and all concerns were addressed.  Next A1C anticipated October 2017.  Written patient instructions provided.  Total time in face to face counseling 20 minutes.   Follow up in Pharmacist Clinic Visit in 2 weeks.   Patient seen with Tasia CatchingsZach Lingle, PharmD Candidate

## 2016-02-11 NOTE — Patient Instructions (Addendum)
Thanks for coming to see [name]!  Continue the metformin.  Hold on to the insulin for now  Start glimepiride 1 tablet every day  Come back in 2 weeks  Just call our office at [phone] if you need any refills!  Make an appointment with financial counseling   Hypoglycemia Low blood sugar (hypoglycemia) means that the level of sugar in your blood is lower than it should be. Signs of low blood sugar include:  Getting sweaty.  Feeling hungry.  Feeling dizzy or weak.  Feeling sleepier than normal.  Feeling nervous.  Headaches.  Having a fast heartbeat. Low blood sugar can happen fast and can be an emergency. Your doctor can do tests to check your blood sugar level. You can have low blood sugar and not have diabetes. HOME CARE  Check your blood sugar as told by your doctor. If it is less than 70 mg/dl or as told by your doctor, take 1 of the following:  3 to 4 glucose tablets.   cup clear juice.   cup soda pop, not diet.  1 cup milk.  5 to 6 hard candies.  Recheck blood sugar after 15 minutes. Repeat until it is at the right level.  Eat a snack if it is more than 1 hour until the next meal.  Only take medicine as told by your doctor.  Do not skip meals. Eat on time.  Do not drink alcohol except with meals.  Check your blood glucose before driving.  Check your blood glucose before and after exercise.  Always carry treatment with you, such as glucose pills.  Always wear a medical alert bracelet if you have diabetes. GET HELP RIGHT AWAY IF:   Your blood glucose goes below 70 mg/dl or as told by your doctor, and you:  Are confused.  Are not able to swallow.  Pass out (faint).  You cannot treat yourself. You may need someone to help you.  You have low blood sugar problems often.  You have problems from your medicines.  You are not feeling better after 3 to 4 days.  You have vision changes. MAKE SURE YOU:   Understand these  instructions.  Will watch this condition.  Will get help right away if you are not doing well or get worse.   This information is not intended to replace advice given to you by your health care provider. Make sure you discuss any questions you have with your health care provider.   Document Released: 09/14/2009 Document Revised: 07/11/2014 Document Reviewed: 02/24/2015 Elsevier Interactive Patient Education [employer]2016 [employer]vier Inc.

## 2016-02-12 NOTE — Telephone Encounter (Signed)
Review of chart show patient was able to make appointment on 02/11/16; no further action is needed. Pollyann KennedyKim Sajan [place]atwood, RN, BSN

## 2016-02-24 ENCOUNTER — Ambulatory Visit: Payer: Self-pay | Admitting: Pharmacist

## 2016-02-24 ENCOUNTER — Telehealth: Payer: Self-pay | Admitting: General Practice

## 2016-02-25 ENCOUNTER — Ambulatory Visit: Payer: Self-pay

## 2016-02-25 ENCOUNTER — Ambulatory Visit: Payer: Self-pay | Admitting: Pharmacist

## 2016-02-25 ENCOUNTER — Emergency Department: Payer: Self-pay

## 2016-02-25 DIAGNOSIS — J40 Bronchitis, not specified as acute or chronic: Secondary | ICD-10-CM | POA: Insufficient documentation

## 2016-02-25 DIAGNOSIS — Z7984 Long term (current) use of oral hypoglycemic drugs: Secondary | ICD-10-CM | POA: Insufficient documentation

## 2016-02-25 DIAGNOSIS — I10 Essential (primary) hypertension: Secondary | ICD-10-CM | POA: Insufficient documentation

## 2016-02-25 DIAGNOSIS — E119 Type 2 diabetes mellitus without complications: Secondary | ICD-10-CM | POA: Insufficient documentation

## 2016-02-25 DIAGNOSIS — Z794 Long term (current) use of insulin: Secondary | ICD-10-CM | POA: Insufficient documentation

## 2016-02-25 DIAGNOSIS — Z79899 Other long term (current) drug therapy: Secondary | ICD-10-CM | POA: Insufficient documentation

## 2016-02-25 DIAGNOSIS — K0889 Other specified disorders of teeth and supporting structures: Secondary | ICD-10-CM | POA: Insufficient documentation

## 2016-02-25 DIAGNOSIS — F17210 Nicotine dependence, cigarettes, uncomplicated: Secondary | ICD-10-CM | POA: Insufficient documentation

## 2016-02-25 DIAGNOSIS — J0100 Acute maxillary sinusitis, unspecified: Secondary | ICD-10-CM | POA: Insufficient documentation

## 2016-02-25 NOTE — ED Triage Notes (Signed)
Patient ambulatory to triage with steady gait, without difficulty or distress noted; pt reports prod cough yellow sputum with sinus drainage and an "infected tooth" to left lower side x few days

## 2016-02-26 ENCOUNTER — Emergency Department
Admission: EM | Admit: 2016-02-26 | Discharge: 2016-02-26 | Disposition: A | Discharge status: Home / Self Care | Payer: Self-pay | Attending: Emergency Medicine | Admitting: Emergency Medicine

## 2016-02-26 DIAGNOSIS — J0100 Acute maxillary sinusitis, unspecified: Secondary | ICD-10-CM

## 2016-02-26 DIAGNOSIS — J40 Bronchitis, not specified as acute or chronic: Secondary | ICD-10-CM

## 2016-02-26 MED ORDER — AMOXICILLIN-POT CLAVULANATE 875-125 MG PO TABS: 1.0000 | ORAL_TABLET | Freq: Two times a day (BID) | ORAL | 0 refills | Status: AC

## 2016-02-26 MED ORDER — AMOXICILLIN-POT CLAVULANATE 875-125 MG PO TABS
1.0000 | ORAL_TABLET | Freq: Once | ORAL | Status: AC
  Administered 2016-02-26: 1 via ORAL
  Filled 2016-02-26: qty 1

## 2016-02-26 NOTE — ED Notes (Signed)
MD Brown at bedside.

## 2016-02-26 NOTE — ED Notes (Signed)
  Reviewed d/c instructions, follow-up care, and prescriptions with pt. Pt verbalized understanding 

## 2016-02-26 NOTE — ED Notes (Signed)
Pt c/o nasal congestion, upper abdominal pain (along bilateral ribs) X 2 days. Pt c/o SOB with exertion.  Pt dental pain upper and lower left jaw. Pt reports he has been [facility] to get [facility]. Pt has gum decay along upper and lower left molars.

## 2016-02-26 NOTE — ED Provider Notes (Signed)
[facility] Emergency Department Provider Note  ____________________________________________   First MD Initiated Contact with Patient 02/26/16 0236     (approximate)  I have reviewed the triage vital signs and the nursing notes.   HISTORY  Chief Complaint Nasal Congestion; Cough; and Dental Pain   HPI [name] is a 55 y.o. male presents with nasal congestion and productive cough of yellow sputum times "few days. Patient denies any fever afebrile here presentation temperature 98.2 of note patient smokes a pack cigarettes per day. Patient concern about the possibility of pneumonia.  Past Medical History:  Diagnosis Date  . Blood clot in vein   . Diabetes mellitus   . DVT (deep venous thrombosis) ([place])   . Hypertension     Patient Active Problem List   Diagnosis Date Noted  . Thrombocytopenia, unspecified ([place]) 12/06/2012  . Diabetic neuropathy ([place]) 12/05/2012  . Smoker 12/05/2012  . Dyslipidemia 12/05/2012  . Diabetes ([place]) 05/11/2011  . Hypertension 05/11/2011    Past Surgical History:  Procedure Laterality Date  . APPENDECTOMY    . VEIN LIGATION AND STRIPPING     of left leg    Prior to Admission medications   Medication Sig Start Date End Date Taking? Authorizing Provider  Blood Glucose Monitoring Suppl (TRUE METRIX METER) w/Device KIT Check blood sugar fasting in the morning, 1 hour after lunch, and at bedtime 01/21/16   Argentina Donovan, PA-C  gemfibrozil (LOPID) 600 MG tablet Take 1 tablet (600 mg total) by mouth 2 (two) times daily before a meal. 05/29/13   Reyne Dumas, MD  glimepiride (AMARYL) 1 MG tablet Take 1 tablet (1 mg total) by mouth daily with breakfast. 02/11/16   Tresa Garter, MD  glucose blood (TRUE METRIX BLOOD GLUCOSE TEST) test strip Use as instructed 01/21/16   Argentina Donovan, PA-C  insulin aspart (NOVOLOG) 100 UNIT/ML injection Inject 10units if blood sugar is greater 300; Inject 15units if blood sugar is  greater 400 01/29/16   Argentina Donovan, PA-C  metFORMIN (GLUCOPHAGE) 1000 MG tablet Take 1 tablet (1,000 mg total) by mouth 2 (two) times daily with a meal. 01/21/16   Argentina Donovan, PA-C  metoprolol (LOPRESSOR) 50 MG tablet Take 1 tablet (50 mg total) by mouth 2 (two) times daily. 01/21/16 01/28/18  Argentina Donovan, PA-C  Syringe, Disposable, (30-35CC SYRINGE) 35 ML MISC Use for injecting insulin 01/29/16   Argentina Donovan, PA-C  TRUEPLUS LANCETS 28G MISC Check blood sugar tid 01/21/16   Argentina Donovan, PA-C  Vitamin D, Ergocalciferol, (DRISDOL) 50000 UNITS CAPS capsule Take 1 capsule (50,000 Units total) by mouth every 7 (seven) days. Patient not taking: Reported on 01/29/2016 05/24/13   Reyne Dumas, MD    Allergies Erythromycin base; Methocarbamol; and Sulfa antibiotics  Family History  Problem Relation Age of Onset  . Heart disease Mother   . Diabetes Father   . Heart disease Father     Social History Social History  Substance Use Topics  . Smoking status: Current Every Day Smoker    Packs/day: 1.00    Types: Cigarettes  . Smokeless tobacco: Not on file  . Alcohol use No    Review of Systems Constitutional: No fever/chills Eyes: No visual changes. ENT: No sore throat.Positive for congestion Cardiovascular: Denies chest pain. Respiratory: Denies shortness of breath.Positive for cough Gastrointestinal: No abdominal pain.  No nausea, no vomiting.  No diarrhea.  No constipation. Genitourinary: Negative for dysuria. Musculoskeletal: Negative for  back pain. Skin: Negative for rash. Neurological: Negative for headaches, focal weakness or numbness.  10-point ROS otherwise negative.  ____________________________________________   PHYSICAL EXAM:  VITAL SIGNS: ED Triage Vitals  Enc Vitals Group     BP 02/25/16 2327 121/83     Pulse Rate 02/25/16 2327 78     Resp 02/25/16 2327 18     Temp 02/25/16 2327 98.2 F (36.8 C)     Temp Source 02/25/16 2327 Oral     SpO2  02/25/16 2327 97 %     Weight 02/25/16 2326 200 lb (90.7 kg)     Height 02/25/16 2326 '5\' 10"'  (1.778 m)     Head Circumference --      [place] Flow --      Pain Score 02/25/16 2327 8     Pain Loc --      Pain Edu? --      Excl. in [place]? --     Constitutional: Alert and oriented. Well appearing and in no acute distress. Eyes: Conjunctivae are normal. PERRL. EOMI. Head: Atraumatic. Pain with palpation of the maxillary sinuses Ears:  Healthy appearing ear canals and TMs bilaterally Nose: No congestion/rhinnorhea. Mouth/Throat: Mucous membranes are moist.  Oropharynx non-erythematous. Neck: No stridor.  No meningeal signs.   Cardiovascular: Normal rate, regular rhythm. Good peripheral circulation. Grossly normal heart sounds. Respiratory: Normal respiratory effort.  No retractions. Lungs CTAB. Gastrointestinal: Soft and nontender. No distention.  Musculoskeletal: No lower extremity tenderness nor edema. No gross deformities of extremities. Neurologic:  Normal speech and language. No gross focal neurologic deficits are appreciated.  Skin:  Skin is warm, dry and intact. No rash noted. Psychiatric: Mood and affect are normal. Speech and behavior are normal.  _______________________________ I, [name], personally viewed and evaluated these images (plain radiographs) as part of my medical decision making, as well as reviewing the written report by the radiologist.**}  Dg Chest 2 View  Result Date: 02/25/2016 CLINICAL DATA:  Productive cough. EXAM: CHEST  2 VIEW COMPARISON:  09/12/2014 FINDINGS: The cardiomediastinal contours are normal. Lingular scarring. Pulmonary vasculature is normal. No consolidation, pleural effusion, or pneumothorax. No acute osseous abnormalities are seen. IMPRESSION: No acute abnormality or evidence of pneumonia.  Lingular scarring. Electronically Signed   By: Jeb Levering M.D.   On: 02/25/2016 23:49     Procedures    INITIAL IMPRESSION / ASSESSMENT AND  PLAN / ED COURSE  Pertinent labs & imaging results that were available during my care of the patient were reviewed by me and considered in my medical decision making (see chart for details).  History physical exam concerning for possible sinusitis/bronchitis as such patient prescribed Augmentin   Clinical Course    ____________________________________________  FINAL CLINICAL IMPRESSION(S) / ED DIAGNOSES  Final diagnoses:  Acute maxillary sinusitis, recurrence not specified  Bronchitis     MEDICATIONS GIVEN DURING THIS VISIT:  Medications - No data to display   NEW OUTPATIENT MEDICATIONS STARTED DURING THIS VISIT:  New Prescriptions   No medications on file      Note:  This document was prepared using Dragon voice recognition software and may include unintentional dictation errors.    Gregor Hams, MD 02/26/16 [phone]

## 2016-03-08 ENCOUNTER — Ambulatory Visit: Payer: Self-pay | Admitting: Pharmacist

## 2016-03-17 ENCOUNTER — Ambulatory Visit: Payer: Self-pay | Admitting: Pharmacist

## 2016-03-22 ENCOUNTER — Ambulatory Visit: Payer: Self-pay | Admitting: Pharmacist

## 2016-03-24 ENCOUNTER — Ambulatory Visit: Payer: Self-pay | Admitting: Pharmacist

## 2016-04-22 ENCOUNTER — Ambulatory Visit: Payer: Self-pay | Admitting: Family Medicine

## 2016-04-28 ENCOUNTER — Other Ambulatory Visit: Payer: Self-pay | Admitting: Internal Medicine

## 2016-04-28 ENCOUNTER — Other Ambulatory Visit: Payer: Self-pay | Admitting: Physician Assistant

## 2016-04-28 DIAGNOSIS — E1110 Type 2 diabetes mellitus with ketoacidosis without coma: Secondary | ICD-10-CM

## 2016-05-24 ENCOUNTER — Other Ambulatory Visit: Payer: Self-pay | Admitting: Pharmacist

## 2016-05-24 DIAGNOSIS — E1110 Type 2 diabetes mellitus with ketoacidosis without coma: Secondary | ICD-10-CM

## 2016-05-24 DIAGNOSIS — I10 Essential (primary) hypertension: Secondary | ICD-10-CM

## 2016-05-24 MED ORDER — METOPROLOL TARTRATE 50 MG PO TABS: 50.0000 mg | ORAL_TABLET | Freq: Two times a day (BID) | ORAL | 0 refills | Status: DC

## 2016-05-24 MED ORDER — METFORMIN HCL 1000 MG PO TABS: 1000.0000 mg | ORAL_TABLET | Freq: Two times a day (BID) | ORAL | 0 refills | Status: DC

## 2016-05-24 MED ORDER — INSULIN ASPART 100 UNIT/ML ~~LOC~~ SOLN: SUBCUTANEOUS | 0 refills | Status: DC

## 2016-05-24 MED ORDER — GLIMEPIRIDE 1 MG PO TABS: 1.0000 mg | ORAL_TABLET | Freq: Every day | ORAL | 0 refills | Status: DC

## 2016-06-07 ENCOUNTER — Ambulatory Visit: Payer: Self-pay | Admitting: Family Medicine

## 2016-06-17 ENCOUNTER — Other Ambulatory Visit: Payer: Self-pay | Admitting: Internal Medicine

## 2016-06-17 DIAGNOSIS — I10 Essential (primary) hypertension: Secondary | ICD-10-CM

## 2016-06-17 DIAGNOSIS — E1110 Type 2 diabetes mellitus with ketoacidosis without coma: Secondary | ICD-10-CM

## 2016-06-30 ENCOUNTER — Encounter: Payer: Self-pay | Admitting: Internal Medicine

## 2016-06-30 ENCOUNTER — Ambulatory Visit: Payer: Self-pay | Attending: Internal Medicine | Admitting: Internal Medicine

## 2016-06-30 VITALS — BP 119/82 | HR 83 | Temp 98.2°F | Resp 18 | Ht 71.0 in | Wt 203.1 lb

## 2016-06-30 DIAGNOSIS — I10 Essential (primary) hypertension: Secondary | ICD-10-CM | POA: Insufficient documentation

## 2016-06-30 DIAGNOSIS — F17200 Nicotine dependence, unspecified, uncomplicated: Secondary | ICD-10-CM

## 2016-06-30 DIAGNOSIS — E119 Type 2 diabetes mellitus without complications: Secondary | ICD-10-CM | POA: Insufficient documentation

## 2016-06-30 DIAGNOSIS — Z720 Tobacco use: Secondary | ICD-10-CM | POA: Insufficient documentation

## 2016-06-30 DIAGNOSIS — E785 Hyperlipidemia, unspecified: Secondary | ICD-10-CM | POA: Insufficient documentation

## 2016-06-30 DIAGNOSIS — E781 Pure hyperglyceridemia: Secondary | ICD-10-CM | POA: Insufficient documentation

## 2016-06-30 DIAGNOSIS — Z86718 Personal history of other venous thrombosis and embolism: Secondary | ICD-10-CM | POA: Insufficient documentation

## 2016-06-30 DIAGNOSIS — E0800 Diabetes mellitus due to underlying condition with hyperosmolarity without nonketotic hyperglycemic-hyperosmolar coma (NKHHC): Secondary | ICD-10-CM

## 2016-06-30 DIAGNOSIS — Z794 Long term (current) use of insulin: Secondary | ICD-10-CM | POA: Insufficient documentation

## 2016-06-30 LAB — CBC WITH DIFFERENTIAL/PLATELET
BASOS PCT: 1 %
Basophils Absolute: 72 cells/uL (ref 0–200)
EOS PCT: 8 %
Eosinophils Absolute: 576 cells/uL — ABNORMAL HIGH (ref 15–500)
HCT: 50.6 % — ABNORMAL HIGH (ref 38.5–50.0)
Hemoglobin: 17.4 g/dL — ABNORMAL HIGH (ref 13.2–17.1)
Lymphocytes Relative: 20 %
Lymphs Abs: 1440 cells/uL (ref 850–3900)
MCH: 32.6 pg (ref 27.0–33.0)
MCHC: 34.4 g/dL (ref 32.0–36.0)
MCV: 94.9 fL (ref 80.0–100.0)
MONOS PCT: 7 %
MPV: 11.1 fL (ref 7.5–12.5)
Monocytes Absolute: 504 cells/uL (ref 200–950)
NEUTROS ABS: 4608 {cells}/uL (ref 1500–7800)
Neutrophils Relative %: 64 %
PLATELETS: 125 10*3/uL — AB (ref 140–400)
RBC: 5.33 MIL/uL (ref 4.20–5.80)
RDW: 12.8 % (ref 11.0–15.0)
WBC: 7.2 10*3/uL (ref 3.8–10.8)

## 2016-06-30 LAB — GLUCOSE, POCT (MANUAL RESULT ENTRY): POC Glucose: 151 mg/dl — AB (ref 70–99)

## 2016-06-30 LAB — T4, FREE: FREE T4: 1.2 ng/dL (ref 0.8–1.8)

## 2016-06-30 LAB — POCT GLYCOSYLATED HEMOGLOBIN (HGB A1C): Hemoglobin A1C: 7.8

## 2016-06-30 LAB — TSH: TSH: 2.6 m[IU]/L (ref 0.40–4.50)

## 2016-06-30 MED ORDER — GEMFIBROZIL 600 MG PO TABS: 600.0000 mg | ORAL_TABLET | Freq: Two times a day (BID) | ORAL | 3 refills | Status: DC

## 2016-06-30 MED ORDER — TRUE METRIX METER W/DEVICE KIT: 1.0000 | PACK | Freq: Three times a day (TID) | 0 refills | Status: DC

## 2016-06-30 MED ORDER — GLUCOSE BLOOD VI STRP: ORAL_STRIP | 12 refills | Status: DC

## 2016-06-30 MED ORDER — METFORMIN HCL 1000 MG PO TABS: 1000.0000 mg | ORAL_TABLET | Freq: Two times a day (BID) | ORAL | 3 refills | Status: DC

## 2016-06-30 MED ORDER — GLIMEPIRIDE 1 MG PO TABS: 1.0000 mg | ORAL_TABLET | Freq: Every day | ORAL | 3 refills | Status: DC

## 2016-06-30 MED ORDER — METOPROLOL TARTRATE 50 MG PO TABS: 50.0000 mg | ORAL_TABLET | Freq: Two times a day (BID) | ORAL | 3 refills | Status: DC

## 2016-06-30 MED ORDER — BUPROPION HCL 100 MG PO TABS: 100.0000 mg | ORAL_TABLET | Freq: Two times a day (BID) | ORAL | 3 refills | Status: DC

## 2016-06-30 NOTE — Patient Instructions (Signed)
Diabetes and Foot Care Diabetes may cause you to have problems because of poor blood supply (circulation) to your feet and legs. This may cause the skin on your feet to become thinner, break easier, and heal more slowly. Your skin may become dry, and the skin may peel and crack. You may also have nerve damage in your legs and feet causing decreased feeling in them. You may not notice minor injuries to your feet that could lead to infections or more serious problems. Taking care of your feet is one of the most important things you can do for yourself. Follow these instructions at home:  Wear shoes at all times, even in the house. Do not go barefoot. Bare feet are easily injured.  Check your feet daily for blisters, cuts, and redness. If you cannot see the bottom of your feet, use a mirror or ask someone for help.  Wash your feet with warm water (do not use hot water) and mild soap. Then pat your feet and the areas between your toes until they are completely dry. Do not soak your feet as this can dry your skin.  Apply a moisturizing lotion or petroleum jelly (that does not contain alcohol and is unscented) to the skin on your feet and to dry, brittle toenails. Do not apply lotion between your toes.  Trim your toenails straight across. Do not dig under them or around the cuticle. File the edges of your nails with an emery board or nail file.  Do not cut corns or calluses or try to remove them with medicine.  Wear clean socks or stockings every day. Make sure they are not too tight. Do not wear knee-high stockings since they may decrease blood flow to your legs.  Wear shoes that fit properly and have enough cushioning. To break in new shoes, wear them for just a few hours a day. This prevents you from injuring your feet. Always look in your shoes before you put them on to be sure there are no objects inside.  Do not cross your legs. This may decrease the blood flow to your feet.  If you find a  minor scrape, cut, or break in the skin on your feet, keep it and the skin around it clean and dry. These areas may be cleansed with mild soap and water. Do not cleanse the area with peroxide, alcohol, or iodine.  When you remove an adhesive bandage, be sure not to damage the skin around it.  If you have a wound, look at it several times a day to make sure it is healing.  Do not use heating pads or hot water bottles. They may burn your skin. If you have lost feeling in your feet or legs, you may not know it is happening until it is too late.  Make sure your health care provider performs a complete foot exam at least annually or more often if you have foot problems. Report any cuts, sores, or bruises to your health care provider immediately. Contact a health care provider if:  You have an injury that is not healing.  You have cuts or breaks in the skin.  You have an ingrown nail.  You notice redness on your legs or feet.  You feel burning or tingling in your legs or feet.  You have pain or cramps in your legs and feet.  Your legs or feet are numb.  Your feet always feel cold. Get help right away if:  There is increasing   redness, swelling, or pain in or around a wound.  There is a red line that goes up your leg.  Pus is coming from a wound.  You develop a fever or as directed by your health care provider.  You notice a bad smell coming from an ulcer or wound. This information is not intended to replace advice given to you by your health care provider. Make sure you discuss any questions you have with your health care provider. Document Released: 06/17/2000 Document Revised: 11/26/2015 Document Reviewed: 11/27/2012 Elsevier Interactive Patient Education  2017 Elsevier Inc. Blood Glucose Monitoring, Adult Monitoring your blood sugar (glucose) helps you manage your diabetes. It also helps you and your health care provider determine how well your diabetes management plan is  working. Blood glucose monitoring involves checking your blood glucose as often as directed, and keeping a record (log) of your results over time. Why should I monitor my blood glucose? Checking your blood glucose regularly can:  Help you understand how food, exercise, illnesses, and medicines affect your blood glucose.  Let you know what your blood glucose is at any time. You can quickly tell if you are having low blood glucose (hypoglycemia) or high blood glucose (hyperglycemia).  Help you and your health care provider adjust your medicines as needed. When should I check my blood glucose? Follow instructions from your health care provider about how often to check your blood glucose. This may depend on:  The type of diabetes you have.  How well-controlled your diabetes is.  Medicines you are taking. If you have type 1 diabetes:  Check your blood glucose at least 2 times a day.  Also check your blood glucose:  Before every insulin injection.  Before and after exercise.  Between meals.  2 hours after a meal.  Occasionally between 2:00 a.m. and 3:00 a.m., as directed.  Before potentially dangerous tasks, like driving or using heavy machinery.  At bedtime.  You may need to check your blood glucose more often, up to 6-10 times a day:  If you use an insulin pump.  If you need multiple daily injections (MDI).  If your diabetes is not well-controlled.  If you are ill.  If you have a history of severe hypoglycemia.  If you have a history of not knowing when your blood glucose is getting low (hypoglycemia unawareness). If you have type 2 diabetes:  If you take insulin or other diabetes medicines, check your blood glucose at least 2 times a day.  If you are on intensive insulin therapy, check your blood glucose at least 4 times a day. Occasionally, you may also need to check between 2:00 a.m. and 3:00 a.m., as directed.  Also check your blood glucose:  Before and after  exercise.  Before potentially dangerous tasks, like driving or using heavy machinery.  You may need to check your blood glucose more often if:  Your medicine is being adjusted.  Your diabetes is not well-controlled.  You are ill. What is a blood glucose log?  A blood glucose log is a record of your blood glucose readings. It helps you and your health care provider:  Look for patterns in your blood glucose over time.  Adjust your diabetes management plan as needed.  Every time you check your blood glucose, write down your result and notes about things that may be affecting your blood glucose, such as your diet and exercise for the day.  Most glucose meters store a record of glucose readings in   the meter. Some meters allow you to download your records to a computer. How do I check my blood glucose? Follow these steps to get accurate readings of your blood glucose: Supplies needed   Blood glucose meter.  Test strips for your meter. Each meter has its own strips. You must use the strips that come with your meter.  A needle to prick your finger (lancet). Do not use lancets more than once.  A device that holds the lancet (lancing device).  A journal or log book to write down your results. Procedure  Wash your hands with soap and water.  Prick the side of your finger (not the tip) with the lancet. Use a different finger each time.  Gently rub the finger until a small drop of blood appears.  Follow instructions that come with your meter for inserting the test strip, applying blood to the strip, and using your blood glucose meter.  Write down your result and any notes. Alternative testing sites  Some meters allow you to use areas of your body other than your finger (alternative sites) to test your blood.  If you think you may have hypoglycemia, or if you have hypoglycemia unawareness, do not use alternative sites. Use your finger instead.  Alternative sites may not be as  accurate as the fingers, because blood flow is slower in these areas. This means that the result you get may be delayed, and it may be different from the result that you would get from your finger.  The most common alternative sites are:  Forearm.  Thigh.  Palm of the hand. Additional tips  Always keep your supplies with you.  If you have questions or need help, all blood glucose meters have a 24-hour "hotline" number that you can call. You may also contact your health care provider.  After you use a few boxes of test strips, adjust (calibrate) your blood glucose meter by following instructions that came with your meter. This information is not intended to replace advice given to you by your health care provider. Make sure you discuss any questions you have with your health care provider. Document Released: 06/23/2003 Document Revised: 01/08/2016 Document Reviewed: 11/30/2015 Elsevier Interactive Patient Education  2017 Elsevier Inc.  

## 2016-06-30 NOTE — Progress Notes (Signed)
[name], is a 55 y.o. male  [id number]  [id number]  DOB - 10/06/1960  Chief Complaint  Patient presents with  . Diabetes       Subjective:   [name] is a 55 y.o. male with history of poorly controlled diabetes mellitus, hypertension and ongoing tobacco use disorder here today for a follow up visit of diabetes. Previous HbA1C was 13.5% but patient had committed to better life style and diet, eating more vegetables and less carbohydrate. Today, his HbA1C is 7.8%. He is in need of medication refills today. He has no complaint. He wants to quit smoking with medication saying it worked before. Gums and patches do not work for him. He denies any suicidal ideation or thoughts. He denies any symptom of hypoglycemia. Patient has No headache, No chest pain, No abdominal pain - No Nausea, No new weakness tingling or numbness, No Cough - SOB.  Problem  Hypertriglyceridemia  Tobacco Use Disorder    ALLERGIES: Allergies  Allergen Reactions  . Erythromycin Base Anxiety  . Methocarbamol Anxiety and Other (See Comments)    Stomach cramping, weakness  . Sulfa Antibiotics Nausea And Vomiting and Other (See Comments)    Cramping in stomach and hyperventilate.     PAST MEDICAL HISTORY: Past Medical History:  Diagnosis Date  . Blood clot in vein   . Diabetes mellitus   . DVT (deep venous thrombosis) ([place])   . Hypertension     MEDICATIONS AT HOME: Prior to Admission medications   Medication Sig Start Date End Date Taking? Authorizing Provider  Blood Glucose Monitoring Suppl (TRUE METRIX METER) w/Device KIT Check blood sugar fasting in the morning, 1 hour after lunch, and at bedtime 01/21/16  Yes Dionne Bucy McClung, PA-C  gemfibrozil (LOPID) 600 MG tablet Take 1 tablet (600 mg total) by mouth 2 (two) times daily before a meal. 06/30/16  Yes Sudie Bandel E Doreene Burke, MD  glimepiride (AMARYL) 1 MG tablet Take 1 tablet (1 mg total) by mouth daily with breakfast. 06/30/16  Yes Tresa Garter, MD  glucose blood (TRUE METRIX BLOOD GLUCOSE TEST) test strip Use as instructed 06/30/16  Yes Tresa Garter, MD  metFORMIN (GLUCOPHAGE) 1000 MG tablet Take 1 tablet (1,000 mg total) by mouth 2 (two) times daily with a meal. 06/30/16  Yes Tresa Garter, MD  metoprolol (LOPRESSOR) 50 MG tablet Take 1 tablet (50 mg total) by mouth 2 (two) times daily. 06/30/16  Yes Tresa Garter, MD  Syringe, Disposable, (30-35CC SYRINGE) 35 ML MISC Use for injecting insulin 01/29/16  Yes Dionne Bucy Carter, PA-C  TRUEPLUS LANCETS 28G MISC Check blood sugar tid 01/21/16  Yes Dionne Bucy McClung, PA-C  Blood Glucose Monitoring Suppl (TRUE METRIX METER) w/Device KIT 1 each by Does not apply route 3 (three) times daily. 06/30/16   Tresa Garter, MD  buPROPion (WELLBUTRIN) 100 MG tablet Take 1 tablet (100 mg total) by mouth 2 (two) times daily. 06/30/16   Tresa Garter, MD  insulin aspart (NOVOLOG) 100 UNIT/ML injection Inject 10units if blood sugar is greater 300; Inject 15units if blood sugar is greater 400 Patient not taking: Reported on 06/30/2016 05/24/16   Tresa Garter, MD  Vitamin D, Ergocalciferol, (DRISDOL) 50000 UNITS CAPS capsule Take 1 capsule (50,000 Units total) by mouth every 7 (seven) days. Patient not taking: Reported on 01/29/2016 05/24/13   Reyne Dumas, MD    Objective:   Vitals:   06/30/16 0927  BP: 119/82  Pulse: 83  Resp: 18  Temp: 98.2 F (36.8 C)  TempSrc: Oral  SpO2: 94%  Weight: 203 lb 1.6 oz (92.1 kg)  Height: '5\' 11"'  (1.803 m)   Exam General appearance : Awake, alert, not in any distress. Speech Clear. Not toxic looking HEENT: Atraumatic and Normocephalic, pupils equally reactive to light and accomodation Neck: Supple, no JVD. No cervical lymphadenopathy.  Chest: Good air entry bilaterally, no added sounds  CVS: S1 S2 regular, no murmurs.  Abdomen: Bowel sounds present, Non tender and not distended with no gaurding, rigidity or  rebound. Extremities: B/L Lower Ext shows no edema, both legs are warm to touch Neurology: Awake alert, and oriented X 3, CN II-XII intact, Non focal Skin: No Rash  Data Review Lab Results  Component Value Date   HGBA1C 7.8 06/30/2016   HGBA1C 13.5 01/21/2016   HGBA1C 7.6 05/08/2013    Assessment & Plan   1. Type 2 diabetes mellitus without complication, without long-term current use of insulin (HCC)  - Glucose (CBG) - POCT A1C  - metFORMIN (GLUCOPHAGE) 1000 MG tablet; Take 1 tablet (1,000 mg total) by mouth 2 (two) times daily with a meal.  Dispense: 180 tablet; Refill: 3 - glucose blood (TRUE METRIX BLOOD GLUCOSE TEST) test strip; Use as instructed  Dispense: 100 each; Refill: 12 - glimepiride (AMARYL) 1 MG tablet; Take 1 tablet (1 mg total) by mouth daily with breakfast.  Dispense: 90 tablet; Refill: 3  2. Essential hypertension  - metoprolol (LOPRESSOR) 50 MG tablet; Take 1 tablet (50 mg total) by mouth 2 (two) times daily.  Dispense: 180 tablet; Refill: 3 - TSH - T4, Free - CBC with Differential - VITAMIN D 25 Hydroxy (Vit-D Deficiency, Fractures)  3. Hypertriglyceridemia  - gemfibrozil (LOPID) 600 MG tablet; Take 1 tablet (600 mg total) by mouth 2 (two) times daily before a meal.  Dispense: 180 tablet; Refill: 3  4. Tobacco use disorder  - buPROPion (WELLBUTRIN) 100 MG tablet; Take 1 tablet (100 mg total) by mouth 2 (two) times daily.  Dispense: 60 tablet; Refill: 3  - [name] was counseled on the dangers of tobacco use, and was advised to quit. Reviewed strategies to maximize success, including removing cigarettes and smoking materials from environment, stress management and support of family/friends.  Patient have been counseled extensively about nutrition and exercise. Other issues discussed during this visit include: low cholesterol diet, weight control and daily exercise, foot care, annual eye examinations at Ophthalmology, importance of adherence with medications  and regular follow-up. We also discussed long term complications of uncontrolled diabetes and hypertension.   Return in about 3 months (around 09/28/2016) for Hemoglobin A1C and Follow up, DM, Follow up Pain and comorbidities, Follow up HTN.  The patient was given clear instructions to go to ER or return to medical center if symptoms don't improve, worsen or new problems develop. The patient verbalized understanding. The patient was told to call to get lab results if they haven't heard anything in the next week.   This note has been created with Surveyor, quantity. Any transcriptional errors are unintentional.    Angelica Chessman, MD, [place], [place], [place], [place] and [place], [place]   06/30/2016, 10:30 AM

## 2016-06-30 NOTE — Progress Notes (Signed)
Patient is here for F/up Diabetes, & Hypertension.  Patient declined the flu shot today.

## 2016-07-01 ENCOUNTER — Telehealth: Payer: Self-pay | Admitting: General Practice

## 2016-07-01 ENCOUNTER — Telehealth: Payer: Self-pay | Admitting: *Deleted

## 2016-07-01 LAB — VITAMIN D 25 HYDROXY (VIT D DEFICIENCY, FRACTURES): VIT D 25 HYDROXY: 24 ng/mL — AB (ref 30–100)

## 2016-07-01 NOTE — Telephone Encounter (Signed)
Patient called for lab results. Please follow up. °

## 2016-07-01 NOTE — Telephone Encounter (Signed)
-----   Message from Quentin Angstlugbemiga E Jegede, MD sent at 07/01/2016 10:23 AM EST ----- Please inform patient that his lab results are mostly normal. Advise over the counter vitamin D supplement to boost his vitamin D level. Continue blood sugar control as discussed. Low carbohydrate diet and regular physical exercise

## 2016-07-01 NOTE — Telephone Encounter (Signed)
Patient called to request lab results °Please follow up with patient °

## 2016-07-01 NOTE — Telephone Encounter (Signed)
Medical Assistant left message on patient's home and cell voicemail. Voicemail states to give a call back to Kennidi Yoshida with [facility] at [phone].  

## 2016-07-05 NOTE — Telephone Encounter (Signed)
Patient was given lab results, from Doctor Message in patient chart.

## 2016-08-10 DIAGNOSIS — F329 Major depressive disorder, single episode, unspecified: Secondary | ICD-10-CM | POA: Insufficient documentation

## 2016-08-22 ENCOUNTER — Encounter: Payer: Self-pay | Admitting: Emergency Medicine

## 2016-08-22 ENCOUNTER — Emergency Department
Admission: EM | Admit: 2016-08-22 | Discharge: 2016-08-22 | Disposition: A | Discharge status: Home / Self Care | Payer: Self-pay | Attending: Emergency Medicine | Admitting: Emergency Medicine

## 2016-08-22 DIAGNOSIS — I10 Essential (primary) hypertension: Secondary | ICD-10-CM | POA: Insufficient documentation

## 2016-08-22 DIAGNOSIS — Z79899 Other long term (current) drug therapy: Secondary | ICD-10-CM | POA: Insufficient documentation

## 2016-08-22 DIAGNOSIS — Z794 Long term (current) use of insulin: Secondary | ICD-10-CM | POA: Insufficient documentation

## 2016-08-22 DIAGNOSIS — E119 Type 2 diabetes mellitus without complications: Secondary | ICD-10-CM | POA: Insufficient documentation

## 2016-08-22 DIAGNOSIS — M5432 Sciatica, left side: Secondary | ICD-10-CM

## 2016-08-22 DIAGNOSIS — M5442 Lumbago with sciatica, left side: Secondary | ICD-10-CM | POA: Insufficient documentation

## 2016-08-22 DIAGNOSIS — F17210 Nicotine dependence, cigarettes, uncomplicated: Secondary | ICD-10-CM | POA: Insufficient documentation

## 2016-08-22 MED ORDER — LIDOCAINE 5 % EX PTCH: 1.0000 | MEDICATED_PATCH | Freq: Two times a day (BID) | CUTANEOUS | 0 refills | Status: AC

## 2016-08-22 MED ORDER — ETODOLAC 200 MG PO CAPS: 200.0000 mg | ORAL_CAPSULE | Freq: Three times a day (TID) | ORAL | 0 refills | Status: DC

## 2016-08-22 MED ORDER — LIDOCAINE 5 % EX PTCH
1.0000 | MEDICATED_PATCH | CUTANEOUS | Status: DC
  Administered 2016-08-22: 1 via TRANSDERMAL
  Filled 2016-08-22: qty 1

## 2016-08-22 MED ORDER — KETOROLAC TROMETHAMINE 60 MG/2ML IM SOLN
60.0000 mg | Freq: Once | INTRAMUSCULAR | Status: DC
  Filled 2016-08-22: qty 2

## 2016-08-22 NOTE — ED Provider Notes (Signed)
[facility] Emergency Department Provider Note   ____________________________________________   First MD Initiated Contact with Patient 08/22/16 [phone]     (approximate)  I have reviewed the triage vital signs and the nursing notes.   HISTORY  Chief Complaint Back Pain    HPI [name] is a 56 y.o. male whocomes into the hospital today with some low back pain. He reports that he's had some tingling pain going down his left leg and into his lower back. He reports it is been going on for 3 weeks. He reports that leg feels stiff when he is walking. He is to hold onto tables when he is walking. He's been taking Tylenol but it has not been working. The patient reports he has not seen his doctor but he has been to [facility] emergency department twice for this. He reports that they checked him out with some x-rays and told him to see or so but he has no way to get an appointment. The patient races and a 10 out of 10 in intensity. The patient denies any numbness, difficulty urinating, difficulty with bowel movements. The patient is here today for evaluation and treatment of his back pain.   Past Medical History:  Diagnosis Date  . Blood clot in vein   . Diabetes mellitus   . DVT (deep venous thrombosis) ([place])   . Hypertension     Patient Active Problem List   Diagnosis Date Noted  . Hypertriglyceridemia 06/30/2016  . Thrombocytopenia, unspecified 12/06/2012  . Diabetic neuropathy ([place]) 12/05/2012  . Tobacco use disorder 12/05/2012  . Dyslipidemia 12/05/2012  . Diabetes ([place]) 05/11/2011  . Hypertension 05/11/2011    Past Surgical History:  Procedure Laterality Date  . APPENDECTOMY    . VEIN LIGATION AND STRIPPING     of left leg    Prior to Admission medications   Medication Sig Start Date End Date Taking? Authorizing Provider  Blood Glucose Monitoring Suppl (TRUE METRIX METER) w/Device KIT Check blood sugar fasting in the morning, 1 hour after lunch,  and at bedtime 01/21/16   Argentina Donovan, PA-C  Blood Glucose Monitoring Suppl (TRUE METRIX METER) w/Device KIT 1 each by Does not apply route 3 (three) times daily. 06/30/16   Tresa Garter, MD  buPROPion (WELLBUTRIN) 100 MG tablet Take 1 tablet (100 mg total) by mouth 2 (two) times daily. 06/30/16   Tresa Garter, MD  etodolac (LODINE) 200 MG capsule Take 1 capsule (200 mg total) by mouth every 8 (eight) hours. 08/22/16   Loney Hering, MD  gemfibrozil (LOPID) 600 MG tablet Take 1 tablet (600 mg total) by mouth 2 (two) times daily before a meal. 06/30/16   Tresa Garter, MD  glimepiride (AMARYL) 1 MG tablet Take 1 tablet (1 mg total) by mouth daily with breakfast. 06/30/16   Tresa Garter, MD  glucose blood (TRUE METRIX BLOOD GLUCOSE TEST) test strip Use as instructed 06/30/16   Tresa Garter, MD  insulin aspart (NOVOLOG) 100 UNIT/ML injection Inject 10units if blood sugar is greater 300; Inject 15units if blood sugar is greater 400 Patient not taking: Reported on 06/30/2016 05/24/16   Tresa Garter, MD  lidocaine (LIDODERM) 5 % Place 1 patch onto the skin every 12 (twelve) hours. Remove & Discard patch within 12 hours or as directed by MD 08/22/16 08/22/17  Loney Hering, MD  metFORMIN (GLUCOPHAGE) 1000 MG tablet Take 1 tablet (1,000 mg total) by mouth 2 (two)  times daily with a meal. 06/30/16   Tresa Garter, MD  metoprolol (LOPRESSOR) 50 MG tablet Take 1 tablet (50 mg total) by mouth 2 (two) times daily. 06/30/16   Tresa Garter, MD  Syringe, Disposable, (30-35CC SYRINGE) 35 ML MISC Use for injecting insulin 01/29/16   Argentina Donovan, PA-C  TRUEPLUS LANCETS 28G MISC Check blood sugar tid 01/21/16   Argentina Donovan, PA-C  Vitamin D, Ergocalciferol, (DRISDOL) 50000 UNITS CAPS capsule Take 1 capsule (50,000 Units total) by mouth every 7 (seven) days. Patient not taking: Reported on 01/29/2016 05/24/13   Reyne Dumas, MD     Allergies Erythromycin base; Methocarbamol; and Sulfa antibiotics  Family History  Problem Relation Age of Onset  . Heart disease Mother   . Diabetes Father   . Heart disease Father     Social History Social History  Substance Use Topics  . Smoking status: Current Every Day Smoker    Packs/day: 1.00    Types: Cigarettes  . Smokeless tobacco: Never Used  . Alcohol use No    Review of Systems Constitutional: No fever/chills Eyes: No visual changes. ENT: No sore throat. Cardiovascular: Denies chest pain. Respiratory: Denies shortness of breath. Gastrointestinal: No abdominal pain.  No nausea, no vomiting.  No diarrhea.  No constipation. Genitourinary: Negative for dysuria. Musculoskeletal:  back pain. Skin: Negative for rash. Neurological: Negative for headaches, focal weakness or numbness.  10-point ROS otherwise negative.  ____________________________________________   PHYSICAL EXAM:  VITAL SIGNS: ED Triage Vitals [08/22/16 0122]  Enc Vitals Group     BP 114/81     Pulse Rate 78     Resp 18     Temp 98.2 F (36.8 C)     Temp Source Oral     SpO2 95 %     Weight 205 lb (93 kg)     Height '5\' 11"'  (1.803 m)     Head Circumference      Peak Flow      Pain Score 10     Pain Loc      Pain Edu?      Excl. in [place]?     Constitutional: Alert and oriented. Well appearing and in mild distress. Eyes: Conjunctivae are normal. PERRL. EOMI. Head: Atraumatic. Nose: No congestion/rhinnorhea. Mouth/Throat: Mucous membranes are moist.  Oropharynx non-erythematous. Cardiovascular: Normal rate, regular rhythm. Grossly normal heart sounds.  Good peripheral circulation. Respiratory: Normal respiratory effort.  No retractions. Lungs CTAB. Gastrointestinal: Soft and nontender. No distention. Positive bowel sounds Musculoskeletal: No lower extremity tenderness nor edema. Tenderness to palpation over the SI joint with some positive straight leg raise.  Neurologic:  Normal  speech and language.  Skin:  Skin is warm, dry and intact.  Psychiatric: Mood and affect are normal.   ____________________________________________   LABS (all labs ordered are listed, but only abnormal results are displayed)  Labs Reviewed - No data to display ____________________________________________  EKG  none ____________________________________________  RADIOLOGY  none ____________________________________________   PROCEDURES  Procedure(s) performed: None  Procedures  Critical Care performed: No  ____________________________________________   INITIAL IMPRESSION / ASSESSMENT AND PLAN / ED COURSE  Pertinent labs & imaging results that were available during my care of the patient were reviewed by me and considered in my medical decision making (see chart for details).  This is a 56 year old male who comes into the hospital today with some back pain. The patient is sleeping on the stretcher the entire time he is talking to me.  He has been to [facility] twice for this and has not been following up or taking his medications. The patient does have a positive straight leg raising he is tender to palpation. I feel the patient's pain is musculoskeletal in nature and that he may need to see a neurosurgeon. Otherwise the patient will be discharged to home.      ____________________________________________   FINAL CLINICAL IMPRESSION(S) / ED DIAGNOSES  Final diagnoses:  Sciatica of left side      NEW MEDICATIONS STARTED DURING THIS VISIT:  New Prescriptions   ETODOLAC (LODINE) 200 MG CAPSULE    Take 1 capsule (200 mg total) by mouth every 8 (eight) hours.   LIDOCAINE (LIDODERM) 5 %    Place 1 patch onto the skin every 12 (twelve) hours. Remove & Discard patch within 12 hours or as directed by MD     Note:  This document was prepared using Dragon voice recognition software and may include unintentional dictation errors.    Loney Hering, MD 08/22/16 [phone]

## 2016-08-22 NOTE — ED Triage Notes (Signed)
Pt presents to ED with c/o lower back pain that radiates down his left leg with no known injury. Pt states pain has been ongoing for the past 3 weeks but has been worse the past several days. Pt states it has been increasingly "difficult to stand and move around at all".

## 2016-08-23 ENCOUNTER — Emergency Department
Admission: EM | Admit: 2016-08-23 | Discharge: 2016-08-23 | Disposition: A | Discharge status: Home / Self Care | Payer: Self-pay | Attending: Emergency Medicine | Admitting: Emergency Medicine

## 2016-08-23 ENCOUNTER — Encounter: Payer: Self-pay | Admitting: Emergency Medicine

## 2016-08-23 DIAGNOSIS — Z794 Long term (current) use of insulin: Secondary | ICD-10-CM | POA: Insufficient documentation

## 2016-08-23 DIAGNOSIS — I10 Essential (primary) hypertension: Secondary | ICD-10-CM | POA: Insufficient documentation

## 2016-08-23 DIAGNOSIS — F17210 Nicotine dependence, cigarettes, uncomplicated: Secondary | ICD-10-CM | POA: Insufficient documentation

## 2016-08-23 DIAGNOSIS — Z79899 Other long term (current) drug therapy: Secondary | ICD-10-CM | POA: Insufficient documentation

## 2016-08-23 DIAGNOSIS — E119 Type 2 diabetes mellitus without complications: Secondary | ICD-10-CM | POA: Insufficient documentation

## 2016-08-23 DIAGNOSIS — M5442 Lumbago with sciatica, left side: Secondary | ICD-10-CM | POA: Insufficient documentation

## 2016-08-23 DIAGNOSIS — G8929 Other chronic pain: Secondary | ICD-10-CM | POA: Insufficient documentation

## 2016-08-23 LAB — GLUCOSE, CAPILLARY: GLUCOSE-CAPILLARY: 160 mg/dL — AB (ref 65–99)

## 2016-08-23 MED ORDER — TRAMADOL HCL 50 MG PO TABS
50.0000 mg | ORAL_TABLET | Freq: Once | ORAL | Status: AC
  Administered 2016-08-23: 50 mg via ORAL
  Filled 2016-08-23: qty 1

## 2016-08-23 MED ORDER — LIDOCAINE 5 % EX PTCH
1.0000 | MEDICATED_PATCH | CUTANEOUS | Status: DC
  Administered 2016-08-23: 1 via TRANSDERMAL
  Filled 2016-08-23 (×2): qty 1

## 2016-08-23 NOTE — ED Notes (Signed)
Pt discharged to home.  Family member driving.  Discharge instructions reviewed.  Verbalized understanding.  No questions or concerns at this time.  Teach back verified.  Pt in NAD.  No items left in ED.   

## 2016-08-23 NOTE — ED Triage Notes (Signed)
Patient ambulatory to triage with steady gait, without difficulty or distress noted, brought in by EMS; pt reports lower back pain radiating down left leg; seen yesterday for same and had lidocaine patch but "that was it"

## 2016-08-23 NOTE — ED Provider Notes (Signed)
[facility] Emergency Department Provider Note   ____________________________________________   First MD Initiated Contact with Patient 08/23/16 0410     (approximate)  I have reviewed the triage vital signs and the nursing notes.   HISTORY  Chief Complaint Back Pain    HPI [name] is a 56 y.o. male comes into the hospital today with some back pain. The patient was here last night with the same complaint. He reports that he has some lower back pain that goes around his left hip and down his leg. He reports it is been worse in last couple of weeks. Has been taking Tylenol but reports that it didn't help. He states that he does not have insurance and needs to go to a clinic in [place]. He states that he couldn't tell if the patch that he was given last night was working and helping his pain. I asked him if there was any difference in the pain from yesterday to today and he stated no. He reports that he cannot see and orthopedic doctor emesis through the clinic in [place] because he has no insurance. He reports it was hurting from even before he was at [facility].   Past Medical History:  Diagnosis Date  . Blood clot in vein   . Diabetes mellitus   . DVT (deep venous thrombosis) ([place])   . Hypertension     Patient Active Problem List   Diagnosis Date Noted  . Hypertriglyceridemia 06/30/2016  . Thrombocytopenia, unspecified 12/06/2012  . Diabetic neuropathy ([place]) 12/05/2012  . Tobacco use disorder 12/05/2012  . Dyslipidemia 12/05/2012  . Diabetes ([place]) 05/11/2011  . Hypertension 05/11/2011    Past Surgical History:  Procedure Laterality Date  . APPENDECTOMY    . VEIN LIGATION AND STRIPPING     of left leg    Prior to Admission medications   Medication Sig Start Date End Date Taking? Authorizing Provider  Blood Glucose Monitoring Suppl (TRUE METRIX METER) w/Device KIT Check blood sugar fasting in the morning, 1 hour after lunch, and  at bedtime 01/21/16   Argentina Donovan, PA-C  Blood Glucose Monitoring Suppl (TRUE METRIX METER) w/Device KIT 1 each by Does not apply route 3 (three) times daily. 06/30/16   Tresa Garter, MD  buPROPion (WELLBUTRIN) 100 MG tablet Take 1 tablet (100 mg total) by mouth 2 (two) times daily. 06/30/16   Tresa Garter, MD  etodolac (LODINE) 200 MG capsule Take 1 capsule (200 mg total) by mouth every 8 (eight) hours. 08/22/16   Loney Hering, MD  gemfibrozil (LOPID) 600 MG tablet Take 1 tablet (600 mg total) by mouth 2 (two) times daily before a meal. 06/30/16   Tresa Garter, MD  glimepiride (AMARYL) 1 MG tablet Take 1 tablet (1 mg total) by mouth daily with breakfast. 06/30/16   Tresa Garter, MD  glucose blood (TRUE METRIX BLOOD GLUCOSE TEST) test strip Use as instructed 06/30/16   Tresa Garter, MD  insulin aspart (NOVOLOG) 100 UNIT/ML injection Inject 10units if blood sugar is greater 300; Inject 15units if blood sugar is greater 400 Patient not taking: Reported on 06/30/2016 05/24/16   Tresa Garter, MD  lidocaine (LIDODERM) 5 % Place 1 patch onto the skin every 12 (twelve) hours. Remove & Discard patch within 12 hours or as directed by MD 08/22/16 08/22/17  Loney Hering, MD  metFORMIN (GLUCOPHAGE) 1000 MG tablet Take 1 tablet (1,000 mg total) by mouth 2 (two)  times daily with a meal. 06/30/16   Tresa Garter, MD  metoprolol (LOPRESSOR) 50 MG tablet Take 1 tablet (50 mg total) by mouth 2 (two) times daily. 06/30/16   Tresa Garter, MD  Syringe, Disposable, (30-35CC SYRINGE) 35 ML MISC Use for injecting insulin 01/29/16   Argentina Donovan, PA-C  TRUEPLUS LANCETS 28G MISC Check blood sugar tid 01/21/16   Argentina Donovan, PA-C  Vitamin D, Ergocalciferol, (DRISDOL) 50000 UNITS CAPS capsule Take 1 capsule (50,000 Units total) by mouth every 7 (seven) days. Patient not taking: Reported on 01/29/2016 05/24/13   Reyne Dumas, MD    Allergies Erythromycin  base; Methocarbamol; and Sulfa antibiotics  Family History  Problem Relation Age of Onset  . Heart disease Mother   . Diabetes Father   . Heart disease Father     Social History Social History  Substance Use Topics  . Smoking status: Current Every Day Smoker    Packs/day: 1.00    Types: Cigarettes  . Smokeless tobacco: Never Used  . Alcohol use No    Review of Systems Constitutional: No fever/chills Eyes: No visual changes. ENT: No sore throat. Cardiovascular: Denies chest pain. Respiratory: Denies shortness of breath. Gastrointestinal: No abdominal pain.  No nausea, no vomiting.  No diarrhea.  No constipation. Genitourinary: Negative for dysuria. Musculoskeletal:  back pain. Skin: Negative for rash. Neurological: Negative for headaches, focal weakness or numbness.  10-point ROS otherwise negative.  ____________________________________________   PHYSICAL EXAM:  VITAL SIGNS: ED Triage Vitals [08/23/16 0123]  Enc Vitals Group     BP (!) 118/95     Pulse Rate 79     Resp 16     Temp 97.5 F (36.4 C)     Temp Source Oral     SpO2 97 %     Weight 205 lb (93 kg)     Height _0  (1.803 m)     Head Circumference      Peak Flow      Pain Score 10     Pain Loc      Pain Edu?      Excl. in [place]?     Constitutional: Alert and oriented. Well appearing and in mild distress. Eyes: Conjunctivae are normal. PERRL. EOMI. Head: Atraumatic. Nose: No congestion/rhinnorhea. Mouth/Throat: Mucous membranes are moist.  Oropharynx non-erythematous. Cardiovascular: Normal rate, regular rhythm. Grossly normal heart sounds.  Good peripheral circulation. Respiratory: Normal respiratory effort.  No retractions. Lungs CTAB. Gastrointestinal: Soft and nontender. No distention. Positive bowel sounds Musculoskeletal: Tenderness to palpation of mid low back with some pain over the sciatic joint on the left. The patient does have a straight leg raise. Neurologic:  Normal speech and  language.  Skin:  Skin is warm, dry and intact. No rash noted. Psychiatric: Mood and affect are normal. Speech and behavior are normal.  ____________________________________________   LABS (all labs ordered are listed, but only abnormal results are displayed)  Labs Reviewed  GLUCOSE, CAPILLARY - Abnormal; Notable for the following:       Result Value   Glucose-Capillary 160 (*)    All other components within normal limits   ____________________________________________  EKG  none ____________________________________________  RADIOLOGY  none ____________________________________________   PROCEDURES  Procedure(s) performed: None  Procedures  Critical Care performed: No  ____________________________________________   INITIAL IMPRESSION / ASSESSMENT AND PLAN / ED COURSE  Pertinent labs & imaging results that were available during my care of the patient were reviewed by me and considered  in my medical decision making (see chart for details).  This is a 56 year old male who comes into the hospital today with some low back pain. The patient was seen last night for the same. I asked him if he would follow-up and he states that he is unable to do so because he has to get to [place]. During my exam the patient is asleep and does have a hard time staying awake. The patient reports that he was wondering if he needed an x-ray or an MRI. I did inform him that he had an x-ray when he was at [facility] for the same complaint and it showed some degenerative disc disease with no acute injury. I did give the patient some tramadol and I will discharge him again to home. The patient has been seen walking with a steady gait without any difficulty. I informed the patient that he needs to try to follow-up with orthopedics and they could evaluate the sciatic nerve pain and consider if he needs any other procedures. The patient will be discharged.       ____________________________________________   FINAL CLINICAL IMPRESSION(S) / ED DIAGNOSES  Final diagnoses:  Chronic right-sided low back pain with left-sided sciatica      NEW MEDICATIONS STARTED DURING THIS VISIT:  Discharge Medication List as of 08/23/2016  5:38 AM       Note:  This document was prepared using Dragon voice recognition software and may include unintentional dictation errors.    Loney Hering, MD 08/23/16 [phone]

## 2016-08-26 ENCOUNTER — Emergency Department (HOSPITAL_COMMUNITY)
Admission: EM | Admit: 2016-08-26 | Discharge: 2016-08-27 | Disposition: A | Discharge status: Home / Self Care | Payer: Self-pay | Attending: Emergency Medicine | Admitting: Emergency Medicine

## 2016-08-26 ENCOUNTER — Encounter (HOSPITAL_COMMUNITY): Payer: Self-pay

## 2016-08-26 DIAGNOSIS — E119 Type 2 diabetes mellitus without complications: Secondary | ICD-10-CM | POA: Insufficient documentation

## 2016-08-26 DIAGNOSIS — Z7984 Long term (current) use of oral hypoglycemic drugs: Secondary | ICD-10-CM | POA: Insufficient documentation

## 2016-08-26 DIAGNOSIS — F332 Major depressive disorder, recurrent severe without psychotic features: Secondary | ICD-10-CM

## 2016-08-26 DIAGNOSIS — Z5900 Homelessness unspecified: Secondary | ICD-10-CM

## 2016-08-26 DIAGNOSIS — Z79899 Other long term (current) drug therapy: Secondary | ICD-10-CM | POA: Insufficient documentation

## 2016-08-26 DIAGNOSIS — F4325 Adjustment disorder with mixed disturbance of emotions and conduct: Secondary | ICD-10-CM

## 2016-08-26 DIAGNOSIS — I10 Essential (primary) hypertension: Secondary | ICD-10-CM | POA: Insufficient documentation

## 2016-08-26 DIAGNOSIS — F4323 Adjustment disorder with mixed anxiety and depressed mood: Secondary | ICD-10-CM | POA: Diagnosis present

## 2016-08-26 DIAGNOSIS — R45851 Suicidal ideations: Secondary | ICD-10-CM

## 2016-08-26 DIAGNOSIS — F17210 Nicotine dependence, cigarettes, uncomplicated: Secondary | ICD-10-CM | POA: Insufficient documentation

## 2016-08-26 LAB — COMPREHENSIVE METABOLIC PANEL
ALBUMIN: 4.3 g/dL (ref 3.5–5.0)
ALT: 19 U/L (ref 17–63)
ANION GAP: 7 (ref 5–15)
AST: 18 U/L (ref 15–41)
Alkaline Phosphatase: 66 U/L (ref 38–126)
BILIRUBIN TOTAL: 0.8 mg/dL (ref 0.3–1.2)
BUN: 18 mg/dL (ref 6–20)
CHLORIDE: 104 mmol/L (ref 101–111)
CO2: 28 mmol/L (ref 22–32)
Calcium: 9.4 mg/dL (ref 8.9–10.3)
Creatinine, Ser: 0.84 mg/dL (ref 0.61–1.24)
GFR calc Af Amer: >60 mL/min (ref >60)
GFR calc non Af Amer: >60 mL/min (ref >60)
GLUCOSE: 159 mg/dL — AB (ref 65–99)
POTASSIUM: 4.2 mmol/L (ref 3.5–5.1)
SODIUM: 139 mmol/L (ref 135–145)
Total Protein: 7.2 g/dL (ref 6.5–8.1)

## 2016-08-26 LAB — CBC
HEMATOCRIT: 44.7 % (ref 39.0–52.0)
Hemoglobin: 16.2 g/dL (ref 13.0–17.0)
MCH: 33.1 pg (ref 26.0–34.0)
MCHC: 36.2 g/dL — ABNORMAL HIGH (ref 30.0–36.0)
MCV: 91.2 fL (ref 78.0–100.0)
Platelets: 134 10*3/uL — ABNORMAL LOW (ref 150–400)
RBC: 4.9 MIL/uL (ref 4.22–5.81)
RDW: 12.6 % (ref 11.5–15.5)
WBC: 9 10*3/uL (ref 4.0–10.5)

## 2016-08-26 LAB — RAPID URINE DRUG SCREEN, HOSP PERFORMED
AMPHETAMINES: NOT DETECTED
BARBITURATES: NOT DETECTED
BENZODIAZEPINES: NOT DETECTED
COCAINE: NOT DETECTED
Opiates: NOT DETECTED
Tetrahydrocannabinol: NOT DETECTED

## 2016-08-26 LAB — SALICYLATE LEVEL: Salicylate Lvl: <7 mg/dL (ref 2.8–30.0)

## 2016-08-26 LAB — ETHANOL: Alcohol, Ethyl (B): <5 mg/dL (ref <5)

## 2016-08-26 LAB — ACETAMINOPHEN LEVEL

## 2016-08-26 MED ORDER — ACETAMINOPHEN 325 MG PO TABS: 650.0000 mg | ORAL_TABLET | ORAL | Status: DC | PRN

## 2016-08-26 MED ORDER — ZOLPIDEM TARTRATE 5 MG PO TABS: 5.0000 mg | ORAL_TABLET | Freq: Every evening | ORAL | Status: DC | PRN

## 2016-08-26 MED ORDER — GEMFIBROZIL 600 MG PO TABS
600.0000 mg | ORAL_TABLET | Freq: Two times a day (BID) | ORAL | Status: DC
  Administered 2016-08-27: 600 mg via ORAL
  Filled 2016-08-26: qty 1

## 2016-08-26 MED ORDER — METOPROLOL TARTRATE 25 MG PO TABS: 50.0000 mg | ORAL_TABLET | Freq: Two times a day (BID) | ORAL | Status: DC

## 2016-08-26 MED ORDER — NICOTINE 21 MG/24HR TD PT24: 21.0000 mg | MEDICATED_PATCH | Freq: Every day | TRANSDERMAL | Status: DC | PRN

## 2016-08-26 MED ORDER — IBUPROFEN 200 MG PO TABS: 600.0000 mg | ORAL_TABLET | Freq: Three times a day (TID) | ORAL | Status: DC | PRN

## 2016-08-26 MED ORDER — ALUM & MAG HYDROXIDE-SIMETH 200-200-20 MG/5ML PO SUSP
30.0000 mL | ORAL | Status: DC | PRN
Start: 2016-08-26 — End: 2016-08-27

## 2016-08-26 MED ORDER — GLIMEPIRIDE 1 MG PO TABS
1.0000 mg | ORAL_TABLET | Freq: Every day | ORAL | Status: DC
  Administered 2016-08-27: 1 mg via ORAL
  Filled 2016-08-26: qty 1

## 2016-08-26 MED ORDER — ONDANSETRON HCL 4 MG PO TABS: 4.0000 mg | ORAL_TABLET | Freq: Three times a day (TID) | ORAL | Status: DC | PRN

## 2016-08-26 MED ORDER — METFORMIN HCL 500 MG PO TABS
1000.0000 mg | ORAL_TABLET | Freq: Two times a day (BID) | ORAL | Status: DC
  Administered 2016-08-27: 1000 mg via ORAL
  Filled 2016-08-26: qty 2

## 2016-08-26 NOTE — ED Notes (Signed)
Pt stated "I've thought about walking out into traffic in the dark with traffic going aout 45 but then I didn't want to hurt someone else, so I would just take a bunch of sleeping pills."

## 2016-08-26 NOTE — ED Notes (Signed)
Requested urine from patient. 

## 2016-08-26 NOTE — ED Notes (Signed)
Bed: [facility]3 Expected date:  Expected time:  Means of arrival:  Comments: 

## 2016-08-26 NOTE — ED Provider Notes (Signed)
[facility] DEPT Provider Note   CSN: [id number] Arrival date & time: 08/26/16  2048     History   Chief Complaint Chief Complaint  Patient presents with  . Suicidal    HPI [name] is a 56 y.o. male.  He presents for evaluation of suicidal ideation with plan to overdose on sleeping pills.  States that he is homeless.  He is taking his Metformin and glimepiride.  He denies fever chills nausea vomiting cough or chest pain.  States he was hospitalized in [place] several weeks ago for a similar problem.  At that time they prescribed Celexa and BuSpar but he cannot afford it.  He smokes cigarettes.  He denies use of alcohol or illegal drugs.  There are no other known modifying factors.   HPI  Past Medical History:  Diagnosis Date  . Blood clot in vein   . Diabetes mellitus   . DVT (deep venous thrombosis) ([place])   . Hypertension     Patient Active Problem List   Diagnosis Date Noted  . Hypertriglyceridemia 06/30/2016  . Thrombocytopenia, unspecified 12/06/2012  . Diabetic neuropathy ([place]) 12/05/2012  . Tobacco use disorder 12/05/2012  . Dyslipidemia 12/05/2012  . Diabetes ([place]) 05/11/2011  . Hypertension 05/11/2011    Past Surgical History:  Procedure Laterality Date  . APPENDECTOMY    . VEIN LIGATION AND STRIPPING     of left leg       Home Medications    Prior to Admission medications   Medication Sig Start Date End Date Taking? Authorizing Provider  acetaminophen (TYLENOL) 325 MG tablet Take 650 mg by mouth every 6 (six) hours as needed.   Yes Historical Provider, MD  busPIRone (BUSPAR) 10 MG tablet Take 10 mg by mouth 3 (three) times daily. 08/15/16  Yes Historical Provider, MD  glimepiride (AMARYL) 1 MG tablet Take 1 tablet (1 mg total) by mouth daily with breakfast. 06/30/16  Yes Tresa Garter, MD  metFORMIN (GLUCOPHAGE) 1000 MG tablet Take 1 tablet (1,000 mg total) by mouth 2 (two) times daily with a meal. 06/30/16  Yes  Tresa Garter, MD  metoprolol (LOPRESSOR) 50 MG tablet Take 1 tablet (50 mg total) by mouth 2 (two) times daily. 06/30/16  Yes Tresa Garter, MD  Blood Glucose Monitoring Suppl (TRUE METRIX METER) w/Device KIT Check blood sugar fasting in the morning, 1 hour after lunch, and at bedtime 01/21/16   Argentina Donovan, PA-C  Blood Glucose Monitoring Suppl (TRUE METRIX METER) w/Device KIT 1 each by Does not apply route 3 (three) times daily. 06/30/16   Tresa Garter, MD  buPROPion (WELLBUTRIN) 100 MG tablet Take 1 tablet (100 mg total) by mouth 2 (two) times daily. Patient not taking: Reported on 08/26/2016 06/30/16   Tresa Garter, MD  etodolac (LODINE) 200 MG capsule Take 1 capsule (200 mg total) by mouth every 8 (eight) hours. 08/22/16   Loney Hering, MD  gemfibrozil (LOPID) 600 MG tablet Take 1 tablet (600 mg total) by mouth 2 (two) times daily before a meal. Patient not taking: Reported on 08/26/2016 06/30/16   Tresa Garter, MD  glucose blood (TRUE METRIX BLOOD GLUCOSE TEST) test strip Use as instructed 06/30/16   Tresa Garter, MD  insulin aspart (NOVOLOG) 100 UNIT/ML injection Inject 10units if blood sugar is greater 300; Inject 15units if blood sugar is greater 400 Patient not taking: Reported on 06/30/2016 05/24/16   Tresa Garter, MD  lidocaine ([place])  5 % Place 1 patch onto the skin every 12 (twelve) hours. Remove & Discard patch within 12 hours or as directed by MD Patient not taking: Reported on 08/26/2016 08/22/16 08/22/17  Loney Hering, MD  Syringe, Disposable, (30-35CC SYRINGE) 35 ML MISC Use for injecting insulin 01/29/16   Argentina Donovan, PA-C  TRUEPLUS LANCETS 28G MISC Check blood sugar tid 01/21/16   Argentina Donovan, PA-C  Vitamin D, Ergocalciferol, (DRISDOL) 50000 UNITS CAPS capsule Take 1 capsule (50,000 Units total) by mouth every 7 (seven) days. Patient not taking: Reported on 01/29/2016 05/24/13   Reyne Dumas, MD    Family  History Family History  Problem Relation Age of Onset  . Heart disease Mother   . Diabetes Father   . Heart disease Father     Social History Social History  Substance Use Topics  . Smoking status: Current Every Day Smoker    Packs/day: 1.00    Types: Cigarettes  . Smokeless tobacco: Never Used  . Alcohol use No     Allergies   Erythromycin base; Benzodiazepines; Xanax [alprazolam]; Methocarbamol; and Sulfa antibiotics   Review of Systems Review of Systems  All other systems reviewed and are negative.    Physical Exam Updated Vital Signs Ht '5\' 11"'  (1.803 m)   Wt 205 lb (93 kg)   BMI 28.59 kg/m   Physical Exam  Constitutional: He is oriented to person, place, and time. He appears well-developed and well-nourished. No distress.  HENT:  Head: Normocephalic and atraumatic.  Right Ear: External ear normal.  Left Ear: External ear normal.  Eyes: Conjunctivae and EOM are normal. Pupils are equal, round, and reactive to light.  Neck: Normal range of motion and phonation normal. Neck supple.  Cardiovascular: Normal rate, regular rhythm and normal heart sounds.   Pulmonary/Chest: Effort normal and breath sounds normal. He exhibits no bony tenderness.  Abdominal: Soft. There is no tenderness.  Musculoskeletal: Normal range of motion.  Neurological: He is alert and oriented to person, place, and time. No cranial nerve deficit or sensory deficit. He exhibits normal muscle tone. Coordination normal.  No dysarthria or aphasia or nystagmus.  Skin: Skin is warm, dry and intact.  Psychiatric: His behavior is normal. Judgment and thought content normal.  He appears depressed.  He is not responding to internal stimuli  Nursing note and vitals reviewed.    ED Treatments / Results  Labs (all labs ordered are listed, but only abnormal results are displayed) Labs Reviewed  COMPREHENSIVE METABOLIC PANEL - Abnormal; Notable for the following:       Result Value   Glucose, Bld 159  (*)    All other components within normal limits  ACETAMINOPHEN LEVEL - Abnormal; Notable for the following:    Acetaminophen (Tylenol), Serum <10 (*)    All other components within normal limits  CBC - Abnormal; Notable for the following:    MCHC 36.2 (*)    Platelets 134 (*)    All other components within normal limits  ETHANOL  SALICYLATE LEVEL  RAPID URINE DRUG SCREEN, HOSP PERFORMED    EKG  EKG Interpretation None       Radiology No results found.  Procedures Procedures (including critical care time)  Medications Ordered in ED Medications - No data to display   Initial Impression / Assessment and Plan / ED Course  I have reviewed the triage vital signs and the nursing notes.  Pertinent labs & imaging results that were available during my care  of the patient were reviewed by me and considered in my medical decision making (see chart for details).  Clinical Course as of Aug 26 2342  Fri Aug 26, 2016  2343 At this time he is medically cleared for treatment by psychiatry.  [EW]    Clinical Course User Index [EW] Daleen Bo, MD    Medications - No data to display  Patient Vitals for the past 24 hrs:  Height Weight  08/26/16 2106 '5\' 11"'  (1.803 m) 205 lb (93 kg)    11:43 PM Reevaluation with update and discussion. After initial assessment and treatment, an updated evaluation reveals he remains comfortable.  Findings discussed with patient and all questions answered. Gricel Copen L    TTS consult   Final Clinical Impressions(s) / ED Diagnoses   Final diagnoses:  Suicidal ideation  Severe episode of recurrent major depressive disorder, without psychotic features ([place])  Homelessness    Suicidal ideation, with homelessness.  Patient is having trouble affording medications which have been prescribed for his depression.  Nursing Notes Reviewed/ Care Coordinated Applicable Imaging Reviewed Interpretation of Laboratory Data incorporated into ED  treatment  Plan-as per TTS in conjunction with oncoming provider team  New Prescriptions New Prescriptions   No medications on file     Daleen Bo, MD 08/26/16 2345

## 2016-08-26 NOTE — ED Notes (Signed)
Pt provided chicken noodle soup & Sprite.

## 2016-08-26 NOTE — ED Triage Notes (Signed)
Pt is homeless and complains of chronic back pain that has increased because of walking around all day, he also says that he's been having suicidal thoughts

## 2016-08-26 NOTE — Progress Notes (Signed)
CSW met with pt and offered him resources in the [place] area for free or low-cost medications through [employer] in [place].  CSW was appreciative and thanked the CSW.  Alphonse Guild. Danell Verno, Latanya Presser, LCAS Clinical Social Worker Ph: [phone]

## 2016-08-27 DIAGNOSIS — F4323 Adjustment disorder with mixed anxiety and depressed mood: Secondary | ICD-10-CM | POA: Diagnosis present

## 2016-08-27 MED ORDER — CITALOPRAM HYDROBROMIDE 10 MG PO TABS: 10.0000 mg | ORAL_TABLET | Freq: Every day | ORAL | 0 refills | Status: DC

## 2016-08-27 MED ORDER — CITALOPRAM HYDROBROMIDE 10 MG PO TABS: 10.0000 mg | ORAL_TABLET | Freq: Every day | ORAL | Status: DC

## 2016-08-27 MED ORDER — BUSPIRONE HCL 10 MG PO TABS: 10.0000 mg | ORAL_TABLET | Freq: Three times a day (TID) | ORAL | 0 refills | Status: DC

## 2016-08-27 MED ORDER — BUSPIRONE HCL 10 MG PO TABS: 10.0000 mg | ORAL_TABLET | Freq: Three times a day (TID) | ORAL | Status: DC

## 2016-08-27 NOTE — ED Notes (Signed)
Hourly rounding reveals patient sleeping in room. No complaints, stable, in no acute distress. Q15 minute rounds and monitoring via Security Cameras to continue. 

## 2016-08-27 NOTE — ED Notes (Signed)
Pt discharged ambulatory with bus pass.  All belongings were returned to pt.  Pt denied s/i at discharge.  RX and discharge instructions were reviewed.

## 2016-08-27 NOTE — Discharge Instructions (Signed)
For your ongoing mental health needs, you are advised to follow up with [facility]. [facility] ([facility]) is a non-profit organization that helps to reduce the impact of substance abuse in our community through providing prevention and treatment services to individuals and families impacted by addictive disease.   [facility] [address], [address] [place], [place] [address] [phone] Outpatient Counseling, Prevention & Early Intervention Services

## 2016-08-27 NOTE — BH Assessment (Signed)
Tele Assessment Note   [name] is an 56 y.o. male presenting to [facility] reporting suicidal ideation with a plan to overdose with sleeping pills. Pt stated "I have chronic depression and chronic exhaustion". "I am tired of being exhausted". "I have been outdoors for 6 months". No previous suicide attempts or self-injurious behaviors reported. Pt denied having access to weapons or firearms. No HI or psychosis reported. No drugs or alcohol use report. Pt was recently hospitalized but did not report any outpatient therapy. No physical, sexual, emotional abuse or traumatic experiences reported.   Diagnosis: F33.2 Major Depressive Disorder, Recurrent episode, Severe   Past Medical History:  Past Medical History:  Diagnosis Date  . Blood clot in vein   . Diabetes mellitus   . DVT (deep venous thrombosis) ([facility])   . Hypertension     Past Surgical History:  Procedure Laterality Date  . APPENDECTOMY    . VEIN LIGATION AND STRIPPING     of left leg    Family History:  Family History  Problem Relation Age of Onset  . Heart disease Mother   . Diabetes Father   . Heart disease Father     Social History:  reports that he has been smoking Cigarettes.  He has been smoking about 1.00 pack per day. He has never used smokeless tobacco. He reports that he does not drink alcohol or use drugs.  Additional Social History:  Alcohol / Drug Use History of alcohol / drug use?: No history of alcohol / drug abuse  CIWA: CIWA-Ar BP: 109/77 Pulse Rate: 71 COWS:    PATIENT STRENGTHS: (choose at least two) Average or above average intelligence Motivation for treatment/growth  Allergies:  Allergies  Allergen Reactions  . Erythromycin Base Anxiety  . Benzodiazepines     PT STATES HE CANNOT TAKE THESE BECAUSE THEY ALMOST KILLED HIM  . Xanax [Alprazolam]   . Methocarbamol Anxiety and Other (See Comments)    Stomach cramping, weakness  . Sulfa Antibiotics Nausea And Vomiting and Other (See Comments)     Cramping in stomach and hyperventilate.     Home Medications:  (Not in a hospital admission)  OB/GYN Status:  No LMP for male patient.  General Assessment Data Location of Assessment: [facility] TTS Assessment: In system Is this a Tele or Face-to-Face Assessment?: Face-to-Face Is this an Initial Assessment or a Re-assessment for this encounter?: Initial Assessment Marital status: Single Living Arrangements: Other (Comment) (Homeless ) Can pt return to current living arrangement?: Yes Admission Status: Voluntary Is patient capable of signing voluntary admission?: Yes Referral Source: Self/Family/Friend Insurance type: None      Crisis Care Plan Living Arrangements: Other (Comment) (Homeless ) Legal Guardian:  (N/A)  Education Status Is patient currently in school?: No  Risk to self with the past 6 months Suicidal Ideation: Yes-Currently Present Has patient been a risk to self within the past 6 months prior to admission? : Yes Suicidal Intent: Yes-Currently Present Has patient had any suicidal intent within the past 6 months prior to admission? : Yes Is patient at risk for suicide?: Yes Suicidal Plan?: Yes-Currently Present Has patient had any suicidal plan within the past 6 months prior to admission? : Yes Specify Current Suicidal Plan: "take sleeping pills"  Access to Means: Yes Specify Access to Suicidal Means: sleeping pills What has been your use of drugs/alcohol within the last 12 months?: Pt denies  Previous Attempts/Gestures: No How many times?: 0 Other Self Harm Risks: PT denies  Triggers  for Past Attempts: None known Intentional Self Injurious Behavior: None Family Suicide History: Unknown Recent stressful life event(s): Financial Problems, Other (Comment) (Housing ) Persecutory voices/beliefs?: No Depression: Yes Depression Symptoms: Isolating, Tearfulness, Fatigue, Guilt, Loss of interest in usual pleasures, Feeling worthless/self pity, Feeling  angry/irritable Substance abuse history and/or treatment for substance abuse?: No Suicide prevention information given to non-admitted patients: Not applicable  Risk to Others within the past 6 months Homicidal Ideation: No Does patient have any lifetime risk of violence toward others beyond the six months prior to admission? : No Thoughts of Harm to Others: No Current Homicidal Intent: No Current Homicidal Plan: No Access to Homicidal Means: No Identified Victim: N/A History of harm to others?: No Assessment of Violence: None Noted Violent Behavior Description: No violent behaviors observed. Pt is calm and cooperative at this time.  Does patient have access to weapons?: No Criminal Charges Pending?: No Does patient have a court date: No Is patient on probation?: No  Psychosis Hallucinations: None noted Delusions: None noted  Mental Status Report Appearance/Hygiene: In scrubs Eye Contact: Poor Motor Activity: Freedom of movement Speech: Logical/coherent Level of Consciousness: Quiet/awake Mood: Euthymic Affect: Appropriate to circumstance Anxiety Level: Minimal Thought Processes: Coherent, Relevant Judgement: Partial Orientation: Person, Place, Time, Situation Obsessive Compulsive Thoughts/Behaviors: None  Cognitive Functioning Concentration: Decreased Memory: Recent Intact, Remote Intact IQ: Average Insight: Poor Impulse Control: Poor Appetite: Fair Weight Loss: 0 Weight Gain: 0 Sleep: Decreased Vegetative Symptoms: Decreased grooming, Not bathing  ADLScreening [facility] Assessment Services) Patient's cognitive ability adequate to safely complete daily activities?: Yes Patient able to express need for assistance with ADLs?: Yes Independently performs ADLs?: Yes (appropriate for developmental age)  Prior Inpatient Therapy Prior Inpatient Therapy: Yes Prior Therapy Dates: 2018 Prior Therapy Facilty/Provider(s): [facility]  Reason for Treatment: SI  Prior  Outpatient Therapy Prior Outpatient Therapy: No Does patient have an ACCT team?: No Does patient have Intensive In-House Services?  : No Does patient have [facility] services? : No Does patient have P4CC services?: No  ADL Screening (condition at time of admission) Patient's cognitive ability adequate to safely complete daily activities?: Yes Is the patient deaf or have difficulty hearing?: No Does the patient have difficulty seeing, even when wearing glasses/contacts?: No Does the patient have difficulty concentrating, remembering, or making decisions?: No Patient able to express need for assistance with ADLs?: Yes Does the patient have difficulty dressing or bathing?: No Independently performs ADLs?: Yes (appropriate for developmental age) Does the patient have difficulty walking or climbing stairs?: No       Abuse/Neglect Assessment (Assessment to be complete while patient is alone) Physical Abuse: Denies Verbal Abuse: Denies Sexual Abuse: Denies Exploitation of patient/patient's resources: Denies Self-Neglect: Denies     Merchant navy officerAdvance Directives (For Healthcare) Does Patient Have a Medical Advance Directive?: No Would patient like information on creating a medical advance directive?: No - Patient declined    Additional Information 1:1 In Past 12 Months?: No CIRT Risk: No Elopement Risk: No Does patient have medical clearance?: Yes     Disposition:  Disposition Initial Assessment Completed for this Encounter: Yes Disposition of Patient: Inpatient treatment program  Shaquasha Gerstel S 08/27/2016 12:22 AM

## 2016-08-27 NOTE — ED Notes (Signed)
TTS assessment in progress. 

## 2016-08-27 NOTE — ED Notes (Signed)
Dr. Adela LankFloyd informed of pt's b/p.  Received verbal order to hold med

## 2016-08-27 NOTE — ED Notes (Signed)
Pt. Transferred to [facility] from ED to room after screening for contraband. Report to include Situation, Background, Assessment and Recommendations from Elaine RN. Pt. Oriented to unit including Q15 minute rounds as well as the security cameras for their protection. Patient is alert and oriented, warm and dry in no acute distress. Patient denies SI, HI, and AVH. Pt. Encouraged to let me know if needs arise. 

## 2016-08-27 NOTE — [facility] Suicide Risk Assessment (Signed)
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 Hypertriglyceridemia [E78.1] 06/30/2016  . Thrombocytopenia, unspecified [D69.6] 12/06/2012  . Diabetic neuropathy ([facility]) [E11.40] 12/05/2012  . Tobacco use disorder [F17.200] 12/05/2012  . Dyslipidemia [E78.5] 12/05/2012  . Diabetes ([facility]) [E11.9] 05/11/2011  . Hypertension [I10] 05/11/2011    Total Time spent with patient: 45 minutes  Musculoskeletal: Strength & Muscle Tone: within normal limits Gait & Station: normal Patient leans: N/A  Psychiatric Specialty Exam:   Blood pressure 137/70, pulse 73, temperature 98.2 F (36.8 C), temperature source Oral, resp. rate 17, height 5\' 11"  (1.803 m), weight 93 kg (205 lb), SpO2 98 %.Body mass index is 28.59 kg/m.  General Appearance: Casual  Eye Contact::  Good  Speech:  Clear and Coherent409  Volume:  Normal  Mood:  Depressed, mild  Affect:  Congruent  Thought Process:  Coherent and Descriptions of Associations: Intact  Orientation:  Full (Time, Place, and Person)  Thought Content:  WDL and Logical  Suicidal Thoughts:  No  Homicidal Thoughts:  No  Memory:  Immediate;   Good Recent;   Good Remote;   Good  Judgement:  Fair  Insight:  Fair  Psychomotor Activity:  Normal  Concentration:  Good  Recall:  Good  Fund of Knowledge:Fair  Language: Good  Akathisia:  No  Handed:  Right  AIMS (if indicated):     Assets:  Leisure Time Physical Health Resilience Social Support  Sleep:     Cognition: WNL  ADL's:  Intact   Mental Status Per Nursing Assessment::   On Admission:   suicidal ideations but got "some rest and feel better".  He also contacted a friend  who he can now stay with, he will pick him up.  No suicidal/homicidal ideations, hallucinations, and alcohol/drug abuse  Demographic Factors:  Male and Caucasian  Loss Factors: NA  Historical Factors: NA  Risk Reduction Factors:   Sense of responsibility to family, Living with another person, especially a relative and Positive social support  Continued Clinical Symptoms:  Depression, mild  Cognitive Features That Contribute To Risk:  None    Suicide Risk:  Minimal: No identifiable suicidal ideation.  Patients presenting with no risk factors but with morbid ruminations; may be classified as minimal risk based on the severity of the depressive symptoms    Plan Of Care/Follow-up recommendations:  Activity:  as tolerated  Diet:  heart healthy diet  LORD, JAMISON, NP 08/27/2016, 9:56 AM

## 2016-10-17 ENCOUNTER — Emergency Department: Admission: EM | Admit: 2016-10-17 | Discharge: 2016-10-17 | Discharge status: Against Medical Advice | Payer: Self-pay

## 2016-10-17 NOTE — ED Notes (Signed)
Called for pt x 3 in waiting room, no answer.

## 2016-11-02 ENCOUNTER — Ambulatory Visit: Payer: Self-pay | Admitting: Internal Medicine

## 2016-11-16 ENCOUNTER — Ambulatory Visit: Payer: Self-pay | Admitting: Internal Medicine

## 2016-12-28 ENCOUNTER — Ambulatory Visit: Payer: Self-pay | Admitting: Internal Medicine

## 2017-01-09 ENCOUNTER — Telehealth: Payer: Self-pay | Admitting: General Practice

## 2017-01-09 NOTE — Telephone Encounter (Signed)
Pt. Called requesting appt. And was told that due to his no shows he would have to come in on Wednesdays to see if he could be seen as a walk-in. Pt.sates he will come in on 01/11/17 at 8:30 a.m. And wait to be seen.

## 2017-01-23 ENCOUNTER — Emergency Department
Admission: EM | Admit: 2017-01-23 | Discharge: 2017-01-23 | Disposition: A | Discharge status: Home / Self Care | Payer: Self-pay | Attending: Emergency Medicine | Admitting: Emergency Medicine

## 2017-01-23 DIAGNOSIS — Z7984 Long term (current) use of oral hypoglycemic drugs: Secondary | ICD-10-CM | POA: Insufficient documentation

## 2017-01-23 DIAGNOSIS — F419 Anxiety disorder, unspecified: Secondary | ICD-10-CM | POA: Insufficient documentation

## 2017-01-23 DIAGNOSIS — Z794 Long term (current) use of insulin: Secondary | ICD-10-CM | POA: Insufficient documentation

## 2017-01-23 DIAGNOSIS — Z791 Long term (current) use of non-steroidal anti-inflammatories (NSAID): Secondary | ICD-10-CM | POA: Insufficient documentation

## 2017-01-23 DIAGNOSIS — I10 Essential (primary) hypertension: Secondary | ICD-10-CM | POA: Insufficient documentation

## 2017-01-23 DIAGNOSIS — E1141 Type 2 diabetes mellitus with diabetic mononeuropathy: Secondary | ICD-10-CM | POA: Insufficient documentation

## 2017-01-23 DIAGNOSIS — F17210 Nicotine dependence, cigarettes, uncomplicated: Secondary | ICD-10-CM | POA: Insufficient documentation

## 2017-01-23 DIAGNOSIS — Z79899 Other long term (current) drug therapy: Secondary | ICD-10-CM | POA: Insufficient documentation

## 2017-01-23 DIAGNOSIS — F17200 Nicotine dependence, unspecified, uncomplicated: Secondary | ICD-10-CM

## 2017-01-23 LAB — GLUCOSE, CAPILLARY: GLUCOSE-CAPILLARY: 257 mg/dL — AB (ref 65–99)

## 2017-01-23 MED ORDER — BUPROPION HCL 100 MG PO TABS: 100.0000 mg | ORAL_TABLET | Freq: Two times a day (BID) | ORAL | 3 refills | Status: DC

## 2017-01-23 MED ORDER — MELOXICAM 15 MG PO TABS: 15.0000 mg | ORAL_TABLET | Freq: Every day | ORAL | 0 refills | Status: DC

## 2017-01-23 NOTE — ED Notes (Signed)
Pt states burning pain which he r/t diabetic neuropathy. Pt states he does not want to take gabapentin. Pt states he wishes rx for Buspar, said he has had success w/ that med in past and he states he has hx of anxiety. Pt states missed appt in July w/ pcp. Pt states his foot feels as if it is swollen and he has decreased mobility.

## 2017-01-23 NOTE — ED Provider Notes (Signed)
[facility] Emergency Department Provider Note  ____________________________________________  Time seen: Approximately 7:29 PM  I have reviewed the triage vital signs and the nursing notes.   HISTORY  Chief Complaint Foot Pain    HPI [name] is a 56 y.o. male who presents emergency department complaining of left foot pain. Patient reports that he has had a history of neuropathy in the past but does not want to be placed on gabapentin. Patient reports that he typically takes an anti-inflammatory and symptoms go down. Patient has a history of severe anxiety and is also requesting a refill of his BuSpar. Patient denies any trauma to his left foot. Pain has been ongoing 1 month. It is described as a burning/sharp sensation running along both the dorsal and plantar aspects of the foot. Patient denies any lower back pain. He denies any bowel or bladder/, sinus seizure, paresthesias.  Patient also endorses general anxiety and states that he has been placed on BuSpar twice daily. He has since run out of his medication and is requesting refill. Patient reports an increase in his anxiety symptoms but denies any suicidal or homicidal ideations.  Patient has a history of DVT, diabetes mellitus type 2, high blood pressure. Patient's diabetes is not well-controlled. Today's capillary blood glucose is 257, which patient reports is "good". Patient denies any complaints with his diabetes other than neuropathy at this time. Patient denies any edema of lower or upper extremities consistent with DVT. No other complaints at this time. Patient does have a primary care provider and was supposed to see them this month but misses his. Patient reports that he will follow-up in August with his primary care provider.   Past Medical History:  Diagnosis Date  . Blood clot in vein   . Diabetes mellitus   . DVT (deep venous thrombosis) ([place])   . Hypertension     Patient Active Problem  List   Diagnosis Date Noted  . 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 Hypertriglyceridemia 06/30/2016  . Thrombocytopenia, unspecified ([place]) 12/06/2012  . Diabetic neuropathy ([place]) 12/05/2012  . Tobacco use disorder 12/05/2012  . Dyslipidemia 12/05/2012  . Diabetes ([place]) 05/11/2011  . Hypertension 05/11/2011    Past Surgical History:  Procedure Laterality Date  . APPENDECTOMY    . VEIN LIGATION AND STRIPPING     of left leg    Prior to Admission medications   Medication Sig Start Date End Date Taking? Authorizing Provider  acetaminophen (TYLENOL) 325 MG tablet Take 650 mg by mouth every 6 (six) hours as needed.    [provider]  Blood Glucose Monitoring Suppl (TRUE METRIX METER) w/Device KIT Check blood sugar fasting in the morning, 1 hour after lunch, and at bedtime 01/21/16   Freeman Caldron M, PA-C  Blood Glucose Monitoring Suppl (TRUE METRIX METER) w/Device KIT 1 each by Does not apply route 3 (three) times daily. 06/30/16   Tresa Garter, MD  buPROPion (WELLBUTRIN) 100 MG tablet Take 1 tablet (100 mg total) by mouth 2 (two) times daily. 01/23/17   Cuthriell, Charline Bills, PA-C  busPIRone (BUSPAR) 10 MG tablet Take 1 tablet (10 mg total) by mouth 3 (three) times daily. 08/27/16   Patrecia Pour, NP  citalopram (CELEXA) 10 MG tablet Take 1 tablet (10 mg total) by mouth daily. 08/27/16   Patrecia Pour, NP  gemfibrozil (LOPID) 600 MG tablet Take 1 tablet (600 mg total) by mouth 2 (two) times  daily before a meal. Patient not taking: Reported on 08/26/2016 06/30/16   Tresa Garter, MD  glimepiride (AMARYL) 1 MG tablet Take 1 tablet (1 mg total) by mouth daily with breakfast. 06/30/16   Tresa Garter, MD  glucose blood (TRUE METRIX BLOOD GLUCOSE TEST) test strip Use as instructed 06/30/16   Tresa Garter, MD  insulin aspart (NOVOLOG) 100 UNIT/ML injection Inject 10units if blood sugar is greater 300;  Inject 15units if blood sugar is greater 400 Patient not taking: Reported on 06/30/2016 05/24/16   Tresa Garter, MD  lidocaine (LIDODERM) 5 % Place 1 patch onto the skin every 12 (twelve) hours. Remove & Discard patch within 12 hours or as directed by MD Patient not taking: Reported on 08/26/2016 08/22/16 08/22/17  Loney Hering, MD  meloxicam (MOBIC) 15 MG tablet Take 1 tablet (15 mg total) by mouth daily. 01/23/17   Cuthriell, Charline Bills, PA-C  metFORMIN (GLUCOPHAGE) 1000 MG tablet Take 1 tablet (1,000 mg total) by mouth 2 (two) times daily with a meal. 06/30/16   Jegede, Marlena Clipper, MD  metoprolol (LOPRESSOR) 50 MG tablet Take 1 tablet (50 mg total) by mouth 2 (two) times daily. 06/30/16   Tresa Garter, MD  Syringe, Disposable, (30-35CC SYRINGE) 35 ML MISC Use for injecting insulin 01/29/16   Freeman Caldron M, PA-C  TRUEPLUS LANCETS 28G MISC Check blood sugar tid 01/21/16   Argentina Donovan, PA-C  Vitamin D, Ergocalciferol, (DRISDOL) 50000 UNITS CAPS capsule Take 1 capsule (50,000 Units total) by mouth every 7 (seven) days. Patient not taking: Reported on 01/29/2016 05/24/13   Reyne Dumas, MD    Allergies Erythromycin base; Benzodiazepines; Xanax [alprazolam]; Methocarbamol; and Sulfa antibiotics  Family History  Problem Relation Age of Onset  . Heart disease Mother   . Diabetes Father   . Heart disease Father     Social History Social History  Substance Use Topics  . Smoking status: Current Every Day Smoker    Packs/day: 1.00    Types: Cigarettes  . Smokeless tobacco: Never Used  . Alcohol use No     Review of Systems  Constitutional: No fever/chills Eyes: No visual changes.  Cardiovascular: no chest pain. Respiratory: no cough. No SOB. Gastrointestinal: No abdominal pain.  No nausea, no vomiting.  No diarrhea.  No constipation. Genitourinary: Negative for dysuria. No hematuria Musculoskeletal: Negative for musculoskeletal pain. Skin: Negative for  rash, abrasions, lacerations, ecchymosis. Neurological: Negative for headaches, focal weakness or numbness. 10-point ROS otherwise negative.  ____________________________________________   PHYSICAL EXAM:  VITAL SIGNS: ED Triage Vitals  Enc Vitals Group     BP 01/23/17 1838 137/84     Pulse Rate 01/23/17 1838 74     Resp 01/23/17 1838 18     Temp 01/23/17 1838 98.1 F (36.7 C)     Temp Source 01/23/17 1838 Oral     SpO2 01/23/17 1838 100 %     Weight 01/23/17 1838 197 lb (89.4 kg)     Height 01/23/17 1838 _0  (1.803 m)     Head Circumference --      Peak Flow --      Pain Score 01/23/17 1837 10     Pain Loc --      Pain Edu? --      Excl. in [place]? --      Constitutional: Alert and oriented. Well appearing and in no acute distress. Eyes: Conjunctivae are normal. PERRL. EOMI. Head: Atraumatic. Neck: No stridor.  Cardiovascular: Normal rate, regular rhythm. Normal S1 and S2.  Good peripheral circulation. Respiratory: Normal respiratory effort without tachypnea or retractions. Lungs CTAB. Good air entry to the bases with no decreased or absent breath sounds. Gastrointestinal: Bowel sounds 4 quadrants. Soft and nontender to palpation. No guarding or rigidity. No palpable masses. No distention. No CVA tenderness. Musculoskeletal: Full range of motion to all extremities. No gross deformities appreciated.No visible deformity or edema noted to left lower extremity but inspection. Full range of motion to left hip, left knee, left ankle. No visible wounds. Dorsalis pedis pulses appreciated. Sensation intact 5 digits. No palpable abnormality. Palpation of the plantar foot does elicit some tenderness. No specific tenderness. Full range of motion all digits left foot. Neurologic:  Normal speech and language. No gross focal neurologic deficits are appreciated.  Skin:  Skin is warm, dry and intact. No rash noted. Psychiatric: Mood and affect are normal. Speech and behavior are normal.  Patient exhibits appropriate insight and judgement.   ____________________________________________   LABS (all labs ordered are listed, but only abnormal results are displayed)  Labs Reviewed  GLUCOSE, CAPILLARY - Abnormal; Notable for the following:       Result Value   Glucose-Capillary 257 (*)    All other components within normal limits  CBG MONITORING, ED   ____________________________________________  EKG   ____________________________________________  RADIOLOGY   No results found.  ____________________________________________    PROCEDURES  Procedure(s) performed:    Procedures    Medications - No data to display   ____________________________________________   INITIAL IMPRESSION / ASSESSMENT AND PLAN / ED COURSE  Pertinent labs & imaging results that were available during my care of the patient were reviewed by me and considered in my medical decision making (see chart for details).  Review of the [place] CSRS was performed in accordance of the [place] prior to dispensing any controlled drugs.     Patient's diagnosis is consistent with Diabetic neuropathy and general anxiety. Patient presents emergency department with increased neuropathy pain. No trauma. No concerning findings warranting further investigation with labs or imaging. Patient declines Neurontin. Patient's recent labs visualize good renal function and patient verbalizes that he is able to take NSAIDs. As such, patient will be placed on meloxicam for one month. Patient is also endorsing any significant increase in his anxiety. Patient generally is on BuSpar, 100 mg twice a day. Patient has run out of his prescription and it will be refilled at this time.. Patient is to follow-up with his primary care provider. Patient is given ED precautions to return to the ED for any worsening or new symptoms.     ____________________________________________  FINAL CLINICAL IMPRESSION(S) / ED  DIAGNOSES  Final diagnoses:  Diabetic mononeuropathy associated with type 2 diabetes mellitus (HCC)  Anxiety disorder, unspecified type      NEW MEDICATIONS STARTED DURING THIS VISIT:  New Prescriptions   MELOXICAM (MOBIC) 15 MG TABLET    Take 1 tablet (15 mg total) by mouth daily.        This chart was dictated using voice recognition software/Dragon. Despite best efforts to proofread, errors can occur which can change the meaning. Any change was purely unintentional.    Darletta Moll, PA-C 01/23/17 1943    Nance Pear, MD 01/23/17 2032

## 2017-01-23 NOTE — ED Triage Notes (Addendum)
Burning pain to left foot x 2 weeks, pain intensifying today. No injury. Pt alert and oriented X4, active, cooperative, pt in NAD. RR even and unlabored, color WNL.  Pt diabetic, does not take blood sugars at home.  No break in skin integrity, tender to touch.

## 2017-01-23 NOTE — ED Notes (Signed)
See triage note   States he has neuropathy pain  To left foot  States this has been going on for about 2 weeks denies any injury  And pain is worse today  Describes pain as burning type pain

## 2017-01-25 ENCOUNTER — Ambulatory Visit: Payer: Self-pay | Attending: Internal Medicine | Admitting: Internal Medicine

## 2017-01-25 VITALS — BP 127/85 | HR 86 | Temp 98.1°F | Resp 18 | Ht 70.0 in

## 2017-01-25 DIAGNOSIS — Z882 Allergy status to sulfonamides status: Secondary | ICD-10-CM | POA: Insufficient documentation

## 2017-01-25 DIAGNOSIS — Z590 Homelessness: Secondary | ICD-10-CM | POA: Insufficient documentation

## 2017-01-25 DIAGNOSIS — G5792 Unspecified mononeuropathy of left lower limb: Secondary | ICD-10-CM

## 2017-01-25 DIAGNOSIS — M792 Neuralgia and neuritis, unspecified: Secondary | ICD-10-CM

## 2017-01-25 DIAGNOSIS — Z888 Allergy status to other drugs, medicaments and biological substances status: Secondary | ICD-10-CM | POA: Insufficient documentation

## 2017-01-25 DIAGNOSIS — Z86718 Personal history of other venous thrombosis and embolism: Secondary | ICD-10-CM | POA: Insufficient documentation

## 2017-01-25 DIAGNOSIS — F4323 Adjustment disorder with mixed anxiety and depressed mood: Secondary | ICD-10-CM

## 2017-01-25 DIAGNOSIS — Z794 Long term (current) use of insulin: Secondary | ICD-10-CM | POA: Insufficient documentation

## 2017-01-25 DIAGNOSIS — I10 Essential (primary) hypertension: Secondary | ICD-10-CM

## 2017-01-25 DIAGNOSIS — F17200 Nicotine dependence, unspecified, uncomplicated: Secondary | ICD-10-CM

## 2017-01-25 DIAGNOSIS — Z720 Tobacco use: Secondary | ICD-10-CM | POA: Insufficient documentation

## 2017-01-25 DIAGNOSIS — Z881 Allergy status to other antibiotic agents status: Secondary | ICD-10-CM | POA: Insufficient documentation

## 2017-01-25 DIAGNOSIS — G629 Polyneuropathy, unspecified: Secondary | ICD-10-CM | POA: Insufficient documentation

## 2017-01-25 DIAGNOSIS — E1110 Type 2 diabetes mellitus with ketoacidosis without coma: Secondary | ICD-10-CM

## 2017-01-25 MED ORDER — SYRINGE 30-35 ML 35 ML MISC
1 refills | Status: DC
Start: 2017-01-25 — End: 2019-08-15

## 2017-01-25 MED ORDER — BUSPIRONE HCL 10 MG PO TABS: 10.0000 mg | ORAL_TABLET | Freq: Three times a day (TID) | ORAL | 3 refills | Status: DC

## 2017-01-25 MED ORDER — BUPROPION HCL 100 MG PO TABS: 100.0000 mg | ORAL_TABLET | Freq: Two times a day (BID) | ORAL | 3 refills | Status: DC

## 2017-01-25 MED ORDER — TRUE METRIX METER W/DEVICE KIT: 1.0000 | PACK | Freq: Three times a day (TID) | 0 refills | Status: DC

## 2017-01-25 MED ORDER — GABAPENTIN 100 MG PO CAPS: 100.0000 mg | ORAL_CAPSULE | Freq: Three times a day (TID) | ORAL | 3 refills | Status: DC

## 2017-01-25 MED ORDER — TRUEPLUS LANCETS 28G MISC: 2 refills | Status: DC

## 2017-01-25 MED FILL — ?MELOXICAM 15 MG TABLET: 15 MG | 30 days supply | Qty: 30 | Fill #0

## 2017-01-25 MED FILL — !TRUE METRIX BLOOD GLUCOSE: 365 days supply | Qty: 1 | Fill #0

## 2017-01-25 MED FILL — busPIRone HCL 10 MG TABS: 10 | 30 days supply | Qty: 90 | Fill #0

## 2017-01-25 MED FILL — TRUEplus LANCETS 28G MISC: 30 days supply | Qty: 100 | Fill #0

## 2017-01-25 MED FILL — buPROPion HCL 100 MG TABS: 100 | 30 days supply | Qty: 60 | Fill #0

## 2017-01-25 MED FILL — GABAPENTIN 100 MG CAPSULE: 100 | 30 days supply | Qty: 90 | Fill #0

## 2017-01-25 NOTE — Patient Instructions (Signed)
Diabetic Neuropathy Diabetic neuropathy is a nerve disease or nerve damage that is caused by diabetes mellitus. About half of all people with diabetes mellitus have some form of nerve damage. Nerve damage is more common in those who have had diabetes mellitus for many years and who generally have not had good control of their blood sugar (glucose) level. Diabetic neuropathy is a common complication of diabetes mellitus. There are three common types of diabetic neuropathy and a fourth type that is less common and less understood:  Peripheral neuropathy-This is the most common type of diabetic neuropathy. It causes damage to the nerves of the feet and legs first and then eventually the hands and arms. The damage affects the ability to sense touch.  Autonomic neuropathy-This type causes damage to the autonomic nervous system, which controls the following functions: ? Heartbeat. ? Body temperature. ? Blood pressure. ? Urination. ? Digestion. ? Sweating. ? Sexual function.  Focal neuropathy-Focal neuropathy can be painful and unpredictable and occurs most often in older adults with diabetes mellitus. It involves a specific nerve or one area and often comes on suddenly. It usually does not cause long-term problems.  Radiculoplexus neuropathy- Sometimes called lumbosacral radiculoplexus neuropathy, radiculoplexus neuropathy affects the nerves of the thighs, hips, buttocks, or legs. It is more common in people with type 2 diabetes mellitus and in older men. It is characterized by debilitating pain, weakness, and atrophy, usually in the thigh muscles.  What are the causes? The cause of peripheral, autonomic, and focal neuropathies is diabetes mellitus that is uncontrolled and high glucose levels. The cause of radiculoplexus neuropathy is unknown. However, it is thought to be caused by inflammation related to uncontrolled glucose levels. What are the signs or symptoms? Peripheral Neuropathy Peripheral  neuropathy develops slowly over time. When the nerves of the feet and legs no longer work there may be:  Burning, stabbing, or aching pain in the legs or feet.  Inability to feel pressure or pain in your feet. This can lead to: ? Thick calluses over pressure areas. ? Pressure sores. ? Ulcers.  Foot deformities.  Reduced ability to feel temperature changes.  Muscle weakness.  Autonomic Neuropathy The symptoms of autonomic neuropathy vary depending on which nerves are affected. Symptoms may include:  Problems with digestion, such as: ? Feeling sick to your stomach (nausea). ? Vomiting. ? Bloating. ? Constipation. ? Diarrhea. ? Abdominal pain.  Difficulty with urination. This occurs if you lose your ability to sense when your bladder is full. Problems include: ? Urine leakage (incontinence). ? Inability to empty your bladder completely (retention).  Rapid or irregular heartbeat (palpitations).  Blood pressure drops when you stand up (orthostatic hypotension). When you stand up you may feel: ? Dizzy. ? Weak. ? Faint.  In men, inability to attain and maintain an erection.  In women, vaginal dryness and problems with decreased sexual desire and arousal.  Problems with body temperature regulation.  Increased or decreased sweating.  Focal Neuropathy  Abnormal eye movements or abnormal alignment of both eyes.  Weakness in the wrist.  Foot drop. This results in an inability to lift the foot properly and abnormal walking or foot movement.  Paralysis on one side of your face (Bell palsy).  Chest or abdominal pain. Radiculoplexus Neuropathy  Sudden, severe pain in your hip, thigh, or buttocks.  Weakness and wasting of thigh muscles.  Difficulty rising from a seated position.  Abdominal swelling.  Unexplained weight loss (usually more than 10 lb [4.5 kg]). How is   this diagnosed? Peripheral Neuropathy Your senses may be tested. Sensory function testing can be  done with:  A light touch using a monofilament.  A vibration with tuning fork.  A sharp sensation with a pin prick.  Other tests that can help diagnose neuropathy are:  Nerve conduction velocity. This test checks the transmission of an electrical current through a nerve.  Electromyography. This shows how muscles respond to electrical signals transmitted by nearby nerves.  Quantitative sensory testing. This is used to assess how your nerves respond to vibrations and changes in temperature.  Autonomic Neuropathy Diagnosis is often based on reported symptoms. Tell your health care provider if you experience:  Dizziness.  Constipation.  Diarrhea.  Inappropriate urination or inability to urinate.  Inability to get or maintain an erection.  Tests that may be done include:  Electrocardiography or Holter monitor. These are tests that can help show problems with the heart rate or heart rhythm.  An X-ray exam may be done.  Focal Neuropathy Diagnosis is made based on your symptoms and what your health care provider finds during your exam. Other tests may be done. They may include:  Nerve conduction velocities. This checks the transmission of electrical current through a nerve.  Electromyography. This shows how muscles respond to electrical signals transmitted by nearby nerves.  Quantitative sensory testing. This test is used to assess how your nerves respond to vibration and changes in temperature.  Radiculoplexus Neuropathy  Often the first thing is to eliminate any other issue or problems that might be the cause, as there is no standard test for diagnosis.  X-ray exam of your spine and lumbar region.  Spinal tap to rule out cancer.  MRI to rule out other lesions. How is this treated? Once nerve damage occurs, it cannot be reversed. The goal of treatment is to keep the disease or nerve damage from getting worse and affecting more nerve fibers. Controlling your blood  glucose level is the key. Most people with radiculoplexus neuropathy see at least a partial improvement over time. You will need to keep your blood glucose and HbA1c levels in the target range determined by your health care provider. Things that help control blood glucose levels include:  Blood glucose monitoring.  Meal planning.  Physical activity.  Diabetes medicine.  Over time, maintaining lower blood glucose levels helps lessen symptoms. Sometimes, prescription pain medicine is needed. Follow these instructions at home:  Do not smoke.  Keep your blood glucose level in the range that you and your health care provider have determined acceptable for you.  Keep your blood pressure level in the range that you and your health care provider have determined acceptable for you.  Eat a well-balanced diet.  Be physically active every day. Include strength training and balance exercises.  Protect your feet. ? Check your feet every day for sores, cuts, blisters, or signs of infection. ? Wear padded socks and supportive shoes. Use orthotic inserts, if necessary. ? Regularly check the insides of your shoes for worn spots. Make sure there are no rocks or other items inside your shoes before you put them on. Contact a health care provider if:  You have burning, stabbing, or aching pain in the legs or feet.  You are unable to feel pressure or pain in your feet.  You develop problems with digestion such as: ? Nausea. ? Vomiting. ? Bloating. ? Constipation. ? Diarrhea. ? Abdominal pain.  You have difficulty with urination, such as: ? Incontinence. ? Retention.    You have palpitations.  You develop orthostatic hypotension. When you stand up you may feel: ? Dizzy. ? Weak. ? Faint.  You cannot attain and maintain an erection (in men).  You have vaginal dryness and problems with decreased sexual desire and arousal (in women).  You have severe pain in your thighs, legs, or  buttocks.  You have unexplained weight loss. This information is not intended to replace advice given to you by your health care provider. Make sure you discuss any questions you have with your health care provider. Document Released: 08/29/2001 Document Revised: 11/26/2015 Document Reviewed: 11/29/2012 Elsevier Interactive Patient Education  2017 [employer] Blood Glucose Monitoring, Adult Monitoring your blood sugar (glucose) helps you manage your diabetes. It also helps you and your health care provider determine how well your diabetes management plan is working. Blood glucose monitoring involves checking your blood glucose as often as directed, and keeping a record (log) of your results over time. Why should I monitor my blood glucose? Checking your blood glucose regularly can:  Help you understand how food, exercise, illnesses, and medicines affect your blood glucose.  Let you know what your blood glucose is at any time. You can quickly tell if you are having low blood glucose (hypoglycemia) or high blood glucose (hyperglycemia).  Help you and your health care provider adjust your medicines as needed.  When should I check my blood glucose? Follow instructions from your health care provider about how often to check your blood glucose. This may depend on:  The type of diabetes you have.  How well-controlled your diabetes is.  Medicines you are taking.  If you have type 1 diabetes:  Check your blood glucose at least 2 times a day.  Also check your blood glucose: ? Before every insulin injection. ? Before and after exercise. ? Between meals. ? 2 hours after a meal. ? Occasionally between 2:00 a.m. and 3:00 a.m., as directed. ? Before potentially dangerous tasks, like driving or using heavy machinery. ? At bedtime.  You may need to check your blood glucose more often, up to 6-10 times a day: ? If you use an insulin pump. ? If you need multiple daily injections (MDI). ? If  your diabetes is not well-controlled. ? If you are ill. ? If you have a history of severe hypoglycemia. ? If you have a history of not knowing when your blood glucose is getting low (hypoglycemia unawareness). If you have type 2 diabetes:  If you take insulin or other diabetes medicines, check your blood glucose at least 2 times a day.  If you are on intensive insulin therapy, check your blood glucose at least 4 times a day. Occasionally, you may also need to check between 2:00 a.m. and 3:00 a.m., as directed.  Also check your blood glucose: ? Before and after exercise. ? Before potentially dangerous tasks, like driving or using heavy machinery.  You may need to check your blood glucose more often if: ? Your medicine is being adjusted. ? Your diabetes is not well-controlled. ? You are ill. What is a blood glucose log?  A blood glucose log is a record of your blood glucose readings. It helps you and your health care provider: ? Look for patterns in your blood glucose over time. ? Adjust your diabetes management plan as needed.  Every time you check your blood glucose, write down your result and notes about things that may be affecting your blood glucose, such as your diet and exercise for  the day.  Most glucose meters store a record of glucose readings in the meter. Some meters allow you to download your records to a computer. How do I check my blood glucose? Follow these steps to get accurate readings of your blood glucose: Supplies needed   Blood glucose meter.  Test strips for your meter. Each meter has its own strips. You must use the strips that come with your meter.  A needle to prick your finger (lancet). Do not use lancets more than once.  A device that holds the lancet (lancing device).  A journal or log book to write down your results. Procedure  Wash your hands with soap and water.  Prick the side of your finger (not the tip) with the lancet. Use a different  finger each time.  Gently rub the finger until a small drop of blood appears.  Follow instructions that come with your meter for inserting the test strip, applying blood to the strip, and using your blood glucose meter.  Write down your result and any notes. Alternative testing sites  Some meters allow you to use areas of your body other than your finger (alternative sites) to test your blood.  If you think you may have hypoglycemia, or if you have hypoglycemia unawareness, do not use alternative sites. Use your finger instead.  Alternative sites may not be as accurate as the fingers, because blood flow is slower in these areas. This means that the result you get may be delayed, and it may be different from the result that you would get from your finger.  The most common alternative sites are: ? Forearm. ? Thigh. ? Palm of the hand. Additional tips  Always keep your supplies with you.  If you have questions or need help, all blood glucose meters have a 24-hour "hotline" number that you can call. You may also contact your health care provider.  After you use a few boxes of test strips, adjust (calibrate) your blood glucose meter by following instructions that came with your meter. This information is not intended to replace advice given to you by your health care provider. Make sure you discuss any questions you have with your health care provider. Document Released: 06/23/2003 Document Revised: 01/08/2016 Document Reviewed: 11/30/2015 Elsevier Interactive Patient Education  2017 [employer]

## 2017-01-25 NOTE — Progress Notes (Signed)
[name], is a 56 y.o. male  PFX:[id number]  HGD:[id number]  DOB - 1960/11/11  Chief Complaint  Patient presents with  . Diabetes       Subjective:   [name] is a 56 y.o. male with history of DVT, poorly controlled diabetes mellitus, hypertension and ongoing tobacco, as well as homelessness here today for ED visit follow up. Patient presented to the emergency department on 01/23/2017 complaining of left foot pain. Patient reports that he has had a history of neuropathy in the past but does not want to be placed on gabapentin. Patient reports that he typically takes an anti-inflammatory and symptoms go down. Patient has a history of severe anxiety and is also requesting a refill of his BuSpar. Patient denies any trauma to his left foot. Pain has been ongoing 1 month. It is described as a burning/sharp sensation running along both the dorsal and plantar aspects of the foot. Patient denies any lower back pain. He denies any bowel or bladder incontinence. Patient has No headache, No chest pain, No abdominal pain - No Nausea, No new weakness tingling or numbness, No Cough - SOB. He also requests refill on his syringes. Patient claims he is homeless at this time, has no income and concern with how to refill his medications. But he would like to get his anxiety medication and Wellbutrin for smoking cessation.  Problem  Neuropathic Pain of Ankle, Left  Adjustment Disorder With Mixed Anxiety and Depressed Mood  Uncontrolled Type 2 Diabetes Mellitus With Ketoacidosis Without Coma, Without Long-Term Current Use of Insulin (Hcc)  Essential Hypertension    ALLERGIES: Allergies  Allergen Reactions  . Erythromycin Base Anxiety  . Benzodiazepines     PT STATES HE CANNOT TAKE THESE BECAUSE THEY ALMOST KILLED HIM  . Xanax [Alprazolam]   . Methocarbamol Anxiety and Other (See Comments)    Stomach cramping, weakness  . Sulfa Antibiotics Nausea And Vomiting and Other (See Comments)    Cramping  in stomach and hyperventilate.     PAST MEDICAL HISTORY: Past Medical History:  Diagnosis Date  . Blood clot in vein   . Diabetes mellitus   . DVT (deep venous thrombosis) ([place])   . Hypertension     MEDICATIONS AT HOME: Prior to Admission medications   Medication Sig Start Date End Date Taking? Authorizing Provider  acetaminophen (TYLENOL) 325 MG tablet Take 650 mg by mouth every 6 (six) hours as needed.   Yes [provider]  Blood Glucose Monitoring Suppl (TRUE METRIX METER) w/Device KIT 1 each by Does not apply route 3 (three) times daily. 01/25/17  Yes Tresa Garter, MD  buPROPion (WELLBUTRIN) 100 MG tablet Take 1 tablet (100 mg total) by mouth 2 (two) times daily. 01/25/17  Yes Melville Engen, Marlena Clipper, MD  busPIRone (BUSPAR) 10 MG tablet Take 1 tablet (10 mg total) by mouth 3 (three) times daily. 01/25/17  Yes Tresa Garter, MD  citalopram (CELEXA) 10 MG tablet Take 1 tablet (10 mg total) by mouth daily. 08/27/16  Yes Patrecia Pour, NP  gemfibrozil (LOPID) 600 MG tablet Take 1 tablet (600 mg total) by mouth 2 (two) times daily before a meal. 06/30/16  Yes Triton Heidrich E, MD  glimepiride (AMARYL) 1 MG tablet Take 1 tablet (1 mg total) by mouth daily with breakfast. 06/30/16  Yes Kenly Xiao E, MD  glucose blood (TRUE METRIX BLOOD GLUCOSE TEST) test strip Use as instructed 06/30/16  Yes Ziara Thelander E, MD  insulin aspart (NOVOLOG)  100 UNIT/ML injection Inject 10units if blood sugar is greater 300; Inject 15units if blood sugar is greater 400 05/24/16  Yes Jadis Pitter E, MD  lidocaine (LIDODERM) 5 % Place 1 patch onto the skin every 12 (twelve) hours. Remove & Discard patch within 12 hours or as directed by MD 08/22/16 08/22/17 Yes Loney Hering, MD  meloxicam (MOBIC) 15 MG tablet Take 1 tablet (15 mg total) by mouth daily. 01/23/17  Yes Cuthriell, Charline Bills, PA-C  metFORMIN (GLUCOPHAGE) 1000 MG tablet Take 1 tablet (1,000 mg total) by mouth  2 (two) times daily with a meal. 06/30/16  Yes Dynasti Kerman E, MD  metoprolol (LOPRESSOR) 50 MG tablet Take 1 tablet (50 mg total) by mouth 2 (two) times daily. 06/30/16  Yes Tresa Garter, MD  Syringe, Disposable, (30-35CC SYRINGE) 35 ML MISC Use for injecting insulin 01/25/17  Yes Tresa Garter, MD  TRUEPLUS LANCETS 28G MISC Check blood sugar tid 01/25/17  Yes Shaelyn Decarli E, MD  Vitamin D, Ergocalciferol, (DRISDOL) 50000 UNITS CAPS capsule Take 1 capsule (50,000 Units total) by mouth every 7 (seven) days. 05/24/13  Yes Reyne Dumas, MD  gabapentin (NEURONTIN) 100 MG capsule Take 1 capsule (100 mg total) by mouth 3 (three) times daily. 01/25/17   Tresa Garter, MD    Objective:   Vitals:   01/25/17 1157  BP: 127/85  Pulse: 86  Resp: 18  Temp: 98.1 F (36.7 C)  TempSrc: Oral  SpO2: 96%  Height: _0  (1.778 m)   Exam General appearance : Awake, alert, not in any distress. Speech Clear. Not toxic looking HEENT: Atraumatic and Normocephalic, pupils equally reactive to light and accomodation Neck: Supple, no JVD. No cervical lymphadenopathy.  Chest: Good air entry bilaterally, no added sounds  CVS: S1 S2 regular, no murmurs.  Abdomen: Bowel sounds present, Non tender and not distended with no gaurding, rigidity or rebound. Extremities: B/L Lower Ext shows no edema, both legs are warm to touch Neurology: Awake alert, and oriented X 3, CN II-XII intact, Non focal  Data Review Lab Results  Component Value Date   HGBA1C 7.8 06/30/2016   HGBA1C 13.5 01/21/2016   HGBA1C 7.6 05/08/2013    Assessment & Plan   1. Essential hypertension  Aim for 30 minutes of exercise most days. Rethink what you drink. Water is great! Aim for 2-3 Carb Choices per meal (30-45 grams) +/- 1 either way  Aim for 0-15 Carbs per snack if hungry  Include protein in moderation with your meals and snacks  Consider reading food labels for Total Carbohydrate and Fat Grams of  foods  Consider checking BG at alternate times per day  Continue taking medication as directed Be mindful about how much sugar you are adding to beverages and other foods. Fruit Punch - find one with no sugar  Measure and decrease portions of carbohydrate foods  Make your plate and don't go back for seconds  2. Tobacco use disorder  - buPROPion (WELLBUTRIN) 100 MG tablet; Take 1 tablet (100 mg total) by mouth 2 (two) times daily.  Dispense: 60 tablet; Refill: 3  3. Uncontrolled type 2 diabetes mellitus with ketoacidosis without coma, without long-term current use of insulin (HCC)  - Syringe, Disposable, (30-35CC SYRINGE) 35 ML MISC; Use for injecting insulin  Dispense: 100 each; Refill: 1 - TRUEPLUS LANCETS 28G MISC; Check blood sugar tid  Dispense: 100 each; Refill: 2 - Blood Glucose Monitoring Suppl (TRUE METRIX METER) w/Device KIT; 1 each by  Does not apply route 3 (three) times daily.  Dispense: 1 kit; Refill: 0  4. Neuropathic pain of ankle, left Start - gabapentin (NEURONTIN) 100 MG capsule; Take 1 capsule (100 mg total) by mouth 3 (three) times daily.  Dispense: 90 capsule; Refill: 3  5. Adjustment disorder with mixed anxiety and depressed mood  - busPIRone (BUSPAR) 10 MG tablet; Take 1 tablet (10 mg total) by mouth 3 (three) times daily.  Dispense: 90 tablet; Refill: 3 - buPROPion (WELLBUTRIN) 100 MG tablet; Take 1 tablet (100 mg total) by mouth 2 (two) times daily.  Dispense: 60 tablet; Refill: 3  Patient have been counseled extensively about nutrition and exercise. Other issues discussed during this visit include: low cholesterol diet, weight control and daily exercise, foot care, annual eye examinations at Ophthalmology, importance of adherence with medications and regular follow-up. We also discussed long term complications of uncontrolled diabetes and hypertension.   Return in about 3 months (around 04/27/2017) for Hemoglobin A1C and Follow up, DM, Follow up HTN, Follow up  Pain and comorbidities.  The patient was given clear instructions to go to ER or return to medical center if symptoms don't improve, worsen or new problems develop. The patient verbalized understanding. The patient was told to call to get lab results if they haven't heard anything in the next week.   This note has been created with Surveyor, quantity. Any transcriptional errors are unintentional.    Angelica Chessman, MD, [place], Karilyn Cota, [place] [facility] [place], [place]   01/25/2017, 12:44 PM

## 2017-01-31 ENCOUNTER — Telehealth: Payer: Self-pay

## 2017-01-31 DIAGNOSIS — E119 Type 2 diabetes mellitus without complications: Secondary | ICD-10-CM

## 2017-01-31 MED ORDER — GLUCOSE BLOOD VI STRP: ORAL_STRIP | 12 refills | Status: AC

## 2017-01-31 NOTE — Telephone Encounter (Signed)
Test strips refilled and sent to requested pharmacy

## 2017-01-31 NOTE — Telephone Encounter (Signed)
Pt. Called requesting a refill on test strips. Pt. Would like Rx sent to [facility] pharmacy on [address] [place], [place]. Please f/u with pt.

## 2017-02-08 ENCOUNTER — Emergency Department
Admission: EM | Admit: 2017-02-08 | Discharge: 2017-02-08 | Disposition: A | Discharge status: Home / Self Care | Payer: Self-pay | Attending: Emergency Medicine | Admitting: Emergency Medicine

## 2017-02-08 ENCOUNTER — Encounter: Payer: Self-pay | Admitting: Emergency Medicine

## 2017-02-08 DIAGNOSIS — I739 Peripheral vascular disease, unspecified: Secondary | ICD-10-CM | POA: Insufficient documentation

## 2017-02-08 DIAGNOSIS — I10 Essential (primary) hypertension: Secondary | ICD-10-CM | POA: Insufficient documentation

## 2017-02-08 DIAGNOSIS — E1140 Type 2 diabetes mellitus with diabetic neuropathy, unspecified: Secondary | ICD-10-CM | POA: Insufficient documentation

## 2017-02-08 DIAGNOSIS — F17210 Nicotine dependence, cigarettes, uncomplicated: Secondary | ICD-10-CM | POA: Insufficient documentation

## 2017-02-08 DIAGNOSIS — Z86718 Personal history of other venous thrombosis and embolism: Secondary | ICD-10-CM | POA: Insufficient documentation

## 2017-02-08 LAB — CBC WITH DIFFERENTIAL/PLATELET
BASOS ABS: 0.1 10*3/uL (ref 0–0.1)
BASOS PCT: 1 %
Eosinophils Absolute: 0.5 10*3/uL (ref 0–0.7)
Eosinophils Relative: 9 %
HEMATOCRIT: 53.3 % — AB (ref 40.0–52.0)
HEMOGLOBIN: 18.5 g/dL — AB (ref 13.0–18.0)
LYMPHS PCT: 19 %
Lymphs Abs: 1.1 10*3/uL (ref 1.0–3.6)
MCH: 32.1 pg (ref 26.0–34.0)
MCHC: 34.6 g/dL (ref 32.0–36.0)
MCV: 92.8 fL (ref 80.0–100.0)
MONO ABS: 0.4 10*3/uL (ref 0.2–1.0)
Monocytes Relative: 8 %
NEUTROS ABS: 3.7 10*3/uL (ref 1.4–6.5)
NEUTROS PCT: 63 %
Platelets: 129 10*3/uL — ABNORMAL LOW (ref 150–440)
RBC: 5.74 MIL/uL (ref 4.40–5.90)
RDW: 13.3 % (ref 11.5–14.5)
WBC: 5.9 10*3/uL (ref 3.8–10.6)

## 2017-02-08 LAB — COMPREHENSIVE METABOLIC PANEL
ALBUMIN: 4.4 g/dL (ref 3.5–5.0)
ALK PHOS: 63 U/L (ref 38–126)
ALT: 14 U/L — AB (ref 17–63)
AST: 14 U/L — ABNORMAL LOW (ref 15–41)
Anion gap: 8 (ref 5–15)
BILIRUBIN TOTAL: 0.8 mg/dL (ref 0.3–1.2)
BUN: 13 mg/dL (ref 6–20)
CALCIUM: 9.5 mg/dL (ref 8.9–10.3)
CO2: 26 mmol/L (ref 22–32)
CREATININE: 0.87 mg/dL (ref 0.61–1.24)
Chloride: 101 mmol/L (ref 101–111)
GFR calc Af Amer: >60 mL/min (ref >60)
GLUCOSE: 253 mg/dL — AB (ref 65–99)
POTASSIUM: 4.6 mmol/L (ref 3.5–5.1)
Sodium: 135 mmol/L (ref 135–145)
TOTAL PROTEIN: 7.1 g/dL (ref 6.5–8.1)

## 2017-02-08 MED ORDER — ASPIRIN EC 325 MG PO TBEC: 325.0000 mg | DELAYED_RELEASE_TABLET | Freq: Every day | ORAL | 3 refills | Status: AC

## 2017-02-08 MED ORDER — ASPIRIN 81 MG PO CHEW
324.0000 mg | CHEWABLE_TABLET | Freq: Once | ORAL | Status: AC
  Administered 2017-02-08: 324 mg via ORAL
  Filled 2017-02-08: qty 4

## 2017-02-08 NOTE — ED Notes (Signed)
Right arm BP: 145/89 Left arm BP: 132/89 Right ankle: 125/96 Left ankle: 78/49

## 2017-02-08 NOTE — ED Provider Notes (Signed)
[facility] Emergency Department Provider Note       Time seen: ----------------------------------------- 12:56 PM on 02/08/2017 -----------------------------------------     I have reviewed the triage vital signs and the nursing notes.   HISTORY   Chief Complaint Claudication    HPI [name] is a 56 y.o. male who presents to the ED for left foot claudication and posterior foot pain for the last month. Patient reports history of diabetes and neuropathy. Patient states is currently homeless and has not been eating and drinking well. He's been referred to the vascular clinic but does not have an appointment scheduled for almost 30 days. Pain is 10 out of 10 in the left leg.   Past Medical History:  Diagnosis Date  . Blood clot in vein   . Diabetes mellitus   . DVT (deep venous thrombosis) (HCC)   . Hypertension     Patient Active Problem List   Diagnosis Date Noted  . Neuropathic pain of ankle, left 01/25/2017  . Adjustment disorder with mixed anxiety and depressed mood 08/27/2016  . Hypertriglyceridemia 06/30/2016  . Thrombocytopenia, unspecified (HCC) 12/06/2012  . Diabetic neuropathy (HCC) 12/05/2012  . Tobacco use disorder 12/05/2012  . Dyslipidemia 12/05/2012  . Uncontrolled type 2 diabetes mellitus with ketoacidosis without coma, without long-term current use of insulin (HCC) 05/11/2011  . Essential hypertension 05/11/2011    Past Surgical History:  Procedure Laterality Date  . APPENDECTOMY    . VEIN LIGATION AND STRIPPING     of left leg    Allergies Erythromycin base; Benzodiazepines; Xanax [alprazolam]; Methocarbamol; and Sulfa antibiotics  Social History Social History  Substance Use Topics  . Smoking status: Current Every Day Smoker    Packs/day: 1.00    Types: Cigarettes  . Smokeless tobacco: Never Used  . Alcohol use No    Review of Systems Constitutional: Negative for fever. Cardiovascular: Negative for chest  pain. Respiratory: Negative for shortness of breath. Gastrointestinal: Negative for abdominal pain, vomiting and diarrhea. Genitourinary: Negative for dysuria. Musculoskeletal: Positive for left leg pain Skin: Negative for rash. Neurological: Negative for headaches, focal weakness or numbness.  All systems negative/normal/unremarkable except as stated in the HPI  ____________________________________________   PHYSICAL EXAM:  VITAL SIGNS: ED Triage Vitals  Enc Vitals Group     BP 02/08/17 1127 119/81     Pulse Rate 02/08/17 1127 76     Resp 02/08/17 1127 16     Temp 02/08/17 1127 98.6 F (37 C)     Temp Source 02/08/17 1127 Oral     SpO2 02/08/17 1127 97 %     Weight 02/08/17 1125 197 lb (89.4 kg)     Height 02/08/17 1125 5\' 10"  (1.778 m)     Head Circumference --      Peak Flow --      Pain Score 02/08/17 1125 10     Pain Loc --      Pain Edu? --      Excl. in GC? --     Constitutional: Alert and oriented. Well appearing and in no distress. Eyes: Conjunctivae are normal. Normal extraocular movements. Cardiovascular: Normal rate, regular rhythm. No murmurs, rubs, or gallops.Diminished dorsalis pedis pulse in the left leg Respiratory: Normal respiratory effort without tachypnea nor retractions. Breath sounds are clear and equal bilaterally. No wheezes/rales/rhonchi. Gastrointestinal: Soft and nontender. Normal bowel sounds Musculoskeletal: Nontender with normal range of motion in extremities. Mild left lower extremity tenderness. Extensive varicosities are appreciated in the left  leg. Neurologic:  Normal speech and language. No gross focal neurologic deficits are appreciated.  Skin:  Skin is warm, dry and intact. Some skin discoloration is appreciated in the left leg Psychiatric: Mood and affect are normal. Speech and behavior are normal.  ____________________________________________  ED COURSE:  Pertinent labs & imaging results that were available during my care of the  patient were reviewed by me and considered in my medical decision making (see chart for details). Patient presents for left leg pain, we will assess with labs as indicated. Clinical Course as of Feb 08 1434  Wed Feb 08, 2017  1302 ABIs are 0.86 on the right and 0.59 on the left  [JW]    Clinical Course User Index [JW] Emily FilbertWilliams, Jonathan E, MD   Procedures ____________________________________________   LABS (pertinent positives/negatives)  Labs Reviewed  CBC WITH DIFFERENTIAL/PLATELET - Abnormal; Notable for the following:       Result Value   Hemoglobin 18.5 (*)    HCT 53.3 (*)    Platelets 129 (*)    All other components within normal limits  COMPREHENSIVE METABOLIC PANEL - Abnormal; Notable for the following:    Glucose, Bld 253 (*)    AST 14 (*)    ALT 14 (*)    All other components within normal limits   ____________________________________________  FINAL ASSESSMENT AND PLAN  Peripheral vascular disease  Plan: Patient's labs were dictated above. Patient had presented for Leg pain which is likely from chronic peripheral vascular disease. ABIs are as dictated above. He was given aspirin and is encouraged to continue taking adult aspirin daily. He is encouraged to continue his outpatient follow-up as scheduled with vascular surgery.   Emily FilbertWilliams, Jonathan E, MD   Note: This note was generated in part or whole with voice recognition software. Voice recognition is usually quite accurate but there are transcription errors that can and very often do occur. I apologize for any typographical errors that were not detected and corrected.     Emily FilbertWilliams, Jonathan E, MD 02/08/17 [id number]

## 2017-02-08 NOTE — ED Triage Notes (Signed)
C/O left foot claudication and posterior foot pain x 1 month.  Patient has history of diabetes and neuropathy.  Patient has been referred to Vascular clinic and has appointment scheduled for September.

## 2017-02-14 ENCOUNTER — Encounter: Payer: Self-pay | Admitting: Vascular Surgery

## 2017-02-22 ENCOUNTER — Encounter: Payer: Self-pay | Admitting: Vascular Surgery

## 2017-03-08 ENCOUNTER — Encounter (HOSPITAL_COMMUNITY): Payer: Self-pay

## 2017-03-08 ENCOUNTER — Encounter: Payer: Self-pay | Admitting: Vascular Surgery

## 2017-03-08 ENCOUNTER — Ambulatory Visit: Payer: Self-pay | Admitting: Internal Medicine

## 2017-03-31 ENCOUNTER — Encounter: Payer: Self-pay | Admitting: Vascular Surgery

## 2017-04-12 ENCOUNTER — Ambulatory Visit: Payer: Self-pay | Attending: Internal Medicine | Admitting: Internal Medicine

## 2017-04-12 ENCOUNTER — Encounter: Payer: Self-pay | Admitting: Internal Medicine

## 2017-04-12 VITALS — BP 150/91 | HR 70 | Temp 98.0°F | Resp 16 | Wt 206.0 lb

## 2017-04-12 DIAGNOSIS — Z79899 Other long term (current) drug therapy: Secondary | ICD-10-CM | POA: Insufficient documentation

## 2017-04-12 DIAGNOSIS — Z7982 Long term (current) use of aspirin: Secondary | ICD-10-CM | POA: Insufficient documentation

## 2017-04-12 DIAGNOSIS — G5792 Unspecified mononeuropathy of left lower limb: Secondary | ICD-10-CM

## 2017-04-12 DIAGNOSIS — E1110 Type 2 diabetes mellitus with ketoacidosis without coma: Secondary | ICD-10-CM

## 2017-04-12 DIAGNOSIS — E781 Pure hyperglyceridemia: Secondary | ICD-10-CM

## 2017-04-12 DIAGNOSIS — E1165 Type 2 diabetes mellitus with hyperglycemia: Secondary | ICD-10-CM | POA: Insufficient documentation

## 2017-04-12 DIAGNOSIS — Z86718 Personal history of other venous thrombosis and embolism: Secondary | ICD-10-CM | POA: Insufficient documentation

## 2017-04-12 DIAGNOSIS — Z590 Homelessness: Secondary | ICD-10-CM | POA: Insufficient documentation

## 2017-04-12 DIAGNOSIS — E1140 Type 2 diabetes mellitus with diabetic neuropathy, unspecified: Secondary | ICD-10-CM | POA: Insufficient documentation

## 2017-04-12 DIAGNOSIS — Z794 Long term (current) use of insulin: Secondary | ICD-10-CM | POA: Insufficient documentation

## 2017-04-12 DIAGNOSIS — M792 Neuralgia and neuritis, unspecified: Secondary | ICD-10-CM

## 2017-04-12 DIAGNOSIS — E119 Type 2 diabetes mellitus without complications: Secondary | ICD-10-CM

## 2017-04-12 DIAGNOSIS — I10 Essential (primary) hypertension: Secondary | ICD-10-CM

## 2017-04-12 LAB — GLUCOSE, POCT (MANUAL RESULT ENTRY): POC GLUCOSE: 114 mg/dL — AB (ref 70–99)

## 2017-04-12 LAB — POCT GLYCOSYLATED HEMOGLOBIN (HGB A1C): HEMOGLOBIN A1C: 7.7

## 2017-04-12 MED ORDER — GABAPENTIN 100 MG PO CAPS: 100.0000 mg | ORAL_CAPSULE | Freq: Three times a day (TID) | ORAL | 3 refills | Status: DC

## 2017-04-12 MED ORDER — METOPROLOL TARTRATE 50 MG PO TABS: 50.0000 mg | ORAL_TABLET | Freq: Two times a day (BID) | ORAL | 3 refills | Status: DC

## 2017-04-12 MED ORDER — METFORMIN HCL 1000 MG PO TABS: 1000.0000 mg | ORAL_TABLET | Freq: Two times a day (BID) | ORAL | 3 refills | Status: DC

## 2017-04-12 MED ORDER — GEMFIBROZIL 600 MG PO TABS: 600.0000 mg | ORAL_TABLET | Freq: Two times a day (BID) | ORAL | 3 refills | Status: DC

## 2017-04-12 MED ORDER — CITALOPRAM HYDROBROMIDE 10 MG PO TABS: 10.0000 mg | ORAL_TABLET | Freq: Every day | ORAL | 0 refills | Status: DC

## 2017-04-12 MED ORDER — GLIMEPIRIDE 1 MG PO TABS: 1.0000 mg | ORAL_TABLET | Freq: Every day | ORAL | 3 refills | Status: DC

## 2017-04-12 MED FILL — ?CITALOPRAM HBR 10 MG TABLE: 10 | 30 days supply | Qty: 30 | Fill #0

## 2017-04-12 MED FILL — GLIMEPIRIDE 1 MG TABLET: 1 | 30 days supply | Qty: 30 | Fill #0

## 2017-04-12 MED FILL — GEMFIBROZIL 600 MG TABLET: 600 | 30 days supply | Qty: 60 | Fill #0

## 2017-04-12 MED FILL — GABAPENTIN 100 MG CAPSULE: 100 | 90 days supply | Qty: 90 | Fill #0

## 2017-04-12 MED FILL — ?METFORMIN HCL 1,000 MG TAB: 1000 | 30 days supply | Qty: 60 | Fill #0

## 2017-04-12 MED FILL — ?METOPROLOL 50 MG TABLET: 50 | 30 days supply | Qty: 60 | Fill #0

## 2017-04-12 NOTE — Patient Instructions (Signed)
Diabetes Mellitus and Food It is important for you to manage your blood sugar (glucose) level. Your blood glucose level can be greatly affected by what you eat. Eating healthier foods in the appropriate amounts throughout the day at about the same time each day will help you control your blood glucose level. It can also help slow or prevent worsening of your diabetes mellitus. Healthy eating may even help you improve the level of your blood pressure and reach or maintain a healthy weight. General recommendations for healthful eating and cooking habits include:  Eating meals and snacks regularly. Avoid going long periods of time without eating to lose weight.  Eating a diet that consists mainly of plant-based foods, such as fruits, vegetables, nuts, legumes, and whole grains.  Using low-heat cooking methods, such as baking, instead of high-heat cooking methods, such as deep frying.  Work with your dietitian to make sure you understand how to use the Nutrition Facts information on food labels. How can food affect me? Carbohydrates Carbohydrates affect your blood glucose level more than any other type of food. Your dietitian will help you determine how many carbohydrates to eat at each meal and teach you how to count carbohydrates. Counting carbohydrates is important to keep your blood glucose at a healthy level, especially if you are using insulin or taking certain medicines for diabetes mellitus. Alcohol Alcohol can cause sudden decreases in blood glucose (hypoglycemia), especially if you use insulin or take certain medicines for diabetes mellitus. Hypoglycemia can be a life-threatening condition. Symptoms of hypoglycemia (sleepiness, dizziness, and disorientation) are similar to symptoms of having too much alcohol. If your health care provider has given you approval to drink alcohol, do so in moderation and use the following guidelines:  Women should not have more than one drink per day, and men  should not have more than two drinks per day. One drink is equal to: ? 12 oz of beer. ? 5 oz of wine. ? 1 oz of hard liquor.  Do not drink on an empty stomach.  Keep yourself hydrated. Have water, diet soda, or unsweetened iced tea.  Regular soda, juice, and other mixers might contain a lot of carbohydrates and should be counted.  What foods are not recommended? As you make food choices, it is important to remember that all foods are not the same. Some foods have fewer nutrients per serving than other foods, even though they might have the same number of calories or carbohydrates. It is difficult to get your body what it needs when you eat foods with fewer nutrients. Examples of foods that you should avoid that are high in calories and carbohydrates but low in nutrients include:  Trans fats (most processed foods list trans fats on the Nutrition Facts label).  Regular soda.  Juice.  Candy.  Sweets, such as cake, pie, doughnuts, and cookies.  Fried foods.  What foods can I eat? Eat nutrient-rich foods, which will nourish your body and keep you healthy. The food you should eat also will depend on several factors, including:  The calories you need.  The medicines you take.  Your weight.  Your blood glucose level.  Your blood pressure level.  Your cholesterol level.  You should eat a variety of foods, including:  Protein. ? Lean cuts of meat. ? Proteins low in saturated fats, such as fish, egg whites, and beans. Avoid processed meats.  Fruits and vegetables. ? Fruits and vegetables that may help control blood glucose levels, such as apples,   mangoes, and yams.  Dairy products. ? Choose fat-free or low-fat dairy products, such as milk, yogurt, and cheese.  Grains, bread, pasta, and rice. ? Choose whole grain products, such as multigrain bread, whole oats, and brown rice. These foods may help control blood pressure.  Fats. ? Foods containing healthful fats, such as  nuts, avocado, olive oil, canola oil, and fish.  Does everyone with diabetes mellitus have the same meal plan? Because every person with diabetes mellitus is different, there is not one meal plan that works for everyone. It is very important that you meet with a dietitian who will help you create a meal plan that is just right for you. This information is not intended to replace advice given to you by your health care provider. Make sure you discuss any questions you have with your health care provider. Document Released: 03/17/2005 Document Revised: 11/26/2015 Document Reviewed: 05/17/2013 Elsevier Interactive Patient Education  2017 [employer] Inc. Diabetes Mellitus and Exercise Exercising regularly is important for your overall health, especially when you have diabetes (diabetes mellitus). Exercising is not only about losing weight. It has many health benefits, such as increasing muscle strength and bone density and reducing body fat and stress. This leads to improved fitness, flexibility, and endurance, all of which result in better overall health. Exercise has additional benefits for people with diabetes, including:  Reducing appetite.  Helping to lower and control blood glucose.  Lowering blood pressure.  Helping to control amounts of fatty substances (lipids) in the blood, such as cholesterol and triglycerides.  Helping the body to respond better to insulin (improving insulin sensitivity).  Reducing how much insulin the body needs.  Decreasing the risk for heart disease by: ? Lowering cholesterol and triglyceride levels. ? Increasing the levels of good cholesterol. ? Lowering blood glucose levels.  What is my activity plan? Your health care provider or certified diabetes educator can help you make a plan for the type and frequency of exercise (activity plan) that works for you. Make sure that you:  Do at least 150 minutes of moderate-intensity or vigorous-intensity exercise each  week. This could be brisk walking, biking, or water aerobics. ? Do stretching and strength exercises, such as yoga or weightlifting, at least 2 times a week. ? Spread out your activity over at least 3 days of the week.  Get some form of physical activity every day. ? Do not go more than 2 days in a row without some kind of physical activity. ? Avoid being inactive for more than 90 minutes at a time. Take frequent breaks to walk or stretch.  Choose a type of exercise or activity that you enjoy, and set realistic goals.  Start slowly, and gradually increase the intensity of your exercise over time.  What do I need to know about managing my diabetes?  Check your blood glucose before and after exercising. ? If your blood glucose is higher than 240 mg/dL (13.3 mmol/L) before you exercise, check your urine for ketones. If you have ketones in your urine, do not exercise until your blood glucose returns to normal.  Know the symptoms of low blood glucose (hypoglycemia) and how to treat it. Your risk for hypoglycemia increases during and after exercise. Common symptoms of hypoglycemia can include: ? Hunger. ? Anxiety. ? Sweating and feeling clammy. ? Confusion. ? Dizziness or feeling light-headed. ? Increased heart rate or palpitations. ? Blurry vision. ? Tingling or numbness around the mouth, lips, or tongue. ? Tremors or shakes. ?   Irritability.  Keep a rapid-acting carbohydrate snack available before, during, and after exercise to help prevent or treat hypoglycemia.  Avoid injecting insulin into areas of the body that are going to be exercised. For example, avoid injecting insulin into: ? The arms, when playing tennis. ? The legs, when jogging.  Keep records of your exercise habits. Doing this can help you and your health care provider adjust your diabetes management plan as needed. Write down: ? Food that you eat before and after you exercise. ? Blood glucose levels before and after you  exercise. ? The type and amount of exercise you have done. ? When your insulin is expected to peak, if you use insulin. Avoid exercising at times when your insulin is peaking.  When you start a new exercise or activity, work with your health care provider to make sure the activity is safe for you, and to adjust your insulin, medicines, or food intake as needed.  Drink plenty of water while you exercise to prevent dehydration or heat stroke. Drink enough fluid to keep your urine clear or pale yellow. This information is not intended to replace advice given to you by your health care provider. Make sure you discuss any questions you have with your health care provider. Document Released: 09/10/2003 Document Revised: 01/08/2016 Document Reviewed: 11/30/2015 Elsevier Interactive Patient Education  2018 [employer] Inc. Blood Glucose Monitoring, Adult Monitoring your blood sugar (glucose) helps you manage your diabetes. It also helps you and your health care provider determine how well your diabetes management plan is working. Blood glucose monitoring involves checking your blood glucose as often as directed, and keeping a record (log) of your results over time. Why should I monitor my blood glucose? Checking your blood glucose regularly can:  Help you understand how food, exercise, illnesses, and medicines affect your blood glucose.  Let you know what your blood glucose is at any time. You can quickly tell if you are having low blood glucose (hypoglycemia) or high blood glucose (hyperglycemia).  Help you and your health care provider adjust your medicines as needed.  When should I check my blood glucose? Follow instructions from your health care provider about how often to check your blood glucose. This may depend on:  The type of diabetes you have.  How well-controlled your diabetes is.  Medicines you are taking.  If you have type 1 diabetes:  Check your blood glucose at least 2 times a  day.  Also check your blood glucose: ? Before every insulin injection. ? Before and after exercise. ? Between meals. ? 2 hours after a meal. ? Occasionally between 2:00 a.m. and 3:00 a.m., as directed. ? Before potentially dangerous tasks, like driving or using heavy machinery. ? At bedtime.  You may need to check your blood glucose more often, up to 6-10 times a day: ? If you use an insulin pump. ? If you need multiple daily injections (MDI). ? If your diabetes is not well-controlled. ? If you are ill. ? If you have a history of severe hypoglycemia. ? If you have a history of not knowing when your blood glucose is getting low (hypoglycemia unawareness). If you have type 2 diabetes:  If you take insulin or other diabetes medicines, check your blood glucose at least 2 times a day.  If you are on intensive insulin therapy, check your blood glucose at least 4 times a day. Occasionally, you may also need to check between 2:00 a.m. and 3:00 a.m., as directed.    Also check your blood glucose: ? Before and after exercise. ? Before potentially dangerous tasks, like driving or using heavy machinery.  You may need to check your blood glucose more often if: ? Your medicine is being adjusted. ? Your diabetes is not well-controlled. ? You are ill. What is a blood glucose log?  A blood glucose log is a record of your blood glucose readings. It helps you and your health care provider: ? Look for patterns in your blood glucose over time. ? Adjust your diabetes management plan as needed.  Every time you check your blood glucose, write down your result and notes about things that may be affecting your blood glucose, such as your diet and exercise for the day.  Most glucose meters store a record of glucose readings in the meter. Some meters allow you to download your records to a computer. How do I check my blood glucose? Follow these steps to get accurate readings of your blood  glucose: Supplies needed   Blood glucose meter.  Test strips for your meter. Each meter has its own strips. You must use the strips that come with your meter.  A needle to prick your finger (lancet). Do not use lancets more than once.  A device that holds the lancet (lancing device).  A journal or log book to write down your results. Procedure  Wash your hands with soap and water.  Prick the side of your finger (not the tip) with the lancet. Use a different finger each time.  Gently rub the finger until a small drop of blood appears.  Follow instructions that come with your meter for inserting the test strip, applying blood to the strip, and using your blood glucose meter.  Write down your result and any notes. Alternative testing sites  Some meters allow you to use areas of your body other than your finger (alternative sites) to test your blood.  If you think you may have hypoglycemia, or if you have hypoglycemia unawareness, do not use alternative sites. Use your finger instead.  Alternative sites may not be as accurate as the fingers, because blood flow is slower in these areas. This means that the result you get may be delayed, and it may be different from the result that you would get from your finger.  The most common alternative sites are: ? Forearm. ? Thigh. ? Palm of the hand. Additional tips  Always keep your supplies with you.  If you have questions or need help, all blood glucose meters have a 24-hour "hotline" number that you can call. You may also contact your health care provider.  After you use a few boxes of test strips, adjust (calibrate) your blood glucose meter by following instructions that came with your meter. This information is not intended to replace advice given to you by your health care provider. Make sure you discuss any questions you have with your health care provider. Document Released: 06/23/2003 Document Revised: 01/08/2016 Document  Reviewed: 11/30/2015 Elsevier Interactive Patient Education  2017 Elsevier Inc.  

## 2017-04-12 NOTE — Progress Notes (Signed)
[name], is a 56 y.o. male  [id number]  [id number]  DOB - 26-Jul-1960  Chief Complaint  Patient presents with  . Diabetes  . Hypertension      Subjective:   [name] is a 56 y.o. male with history ofDVT,poorly controlled diabetes mellitus, hypertension, ongoing tobacco use disorder and homelessness here today for a follow up visit and medication refill. Patient still complaining of homelessness and financial difficulties. He is in need of medication refills. Patient has No headache, No chest pain, No abdominal pain - No Nausea, No new weakness tingling or numbness, No Cough - SOB.  No problems updated.  ALLERGIES: Allergies  Allergen Reactions  . Erythromycin Base Anxiety  . Benzodiazepines     PT STATES HE CANNOT TAKE THESE BECAUSE THEY ALMOST KILLED HIM  . Xanax [Alprazolam]   . Methocarbamol Anxiety and Other (See Comments)    Stomach cramping, weakness  . Sulfa Antibiotics Nausea And Vomiting and Other (See Comments)    Cramping in stomach and hyperventilate.     PAST MEDICAL HISTORY: Past Medical History:  Diagnosis Date  . Blood clot in vein   . Diabetes mellitus   . DVT (deep venous thrombosis) ([place])   . Hypertension     MEDICATIONS AT HOME: Prior to Admission medications   Medication Sig Start Date End Date Taking? Authorizing Provider  acetaminophen (TYLENOL) 325 MG tablet Take 650 mg by mouth every 6 (six) hours as needed.    [provider]  aspirin EC 325 MG tablet Take 1 tablet (325 mg total) by mouth daily. 02/08/17 02/08/18  Earleen Newport, MD  Blood Glucose Monitoring Suppl (TRUE METRIX METER) w/Device KIT 1 each by Does not apply route 3 (three) times daily. 01/25/17   Tresa Garter, MD  buPROPion (WELLBUTRIN) 100 MG tablet Take 1 tablet (100 mg total) by mouth 2 (two) times daily. 01/25/17   Tresa Garter, MD  busPIRone (BUSPAR) 10 MG tablet Take 1 tablet (10 mg total) by mouth 3 (three) times daily. 01/25/17    Tresa Garter, MD  citalopram (CELEXA) 10 MG tablet Take 1 tablet (10 mg total) by mouth daily. 04/12/17   Tresa Garter, MD  gabapentin (NEURONTIN) 100 MG capsule Take 1 capsule (100 mg total) by mouth 3 (three) times daily. 04/12/17   Tresa Garter, MD  gemfibrozil (LOPID) 600 MG tablet Take 1 tablet (600 mg total) by mouth 2 (two) times daily before a meal. 04/12/17   Jegede, Marlena Clipper, MD  glimepiride (AMARYL) 1 MG tablet Take 1 tablet (1 mg total) by mouth daily with breakfast. 04/12/17   Angelica Chessman E, MD  glucose blood (TRUE METRIX BLOOD GLUCOSE TEST) test strip Use as instructed 01/31/17   Angelica Chessman E, MD  insulin aspart (NOVOLOG) 100 UNIT/ML injection Inject 10units if blood sugar is greater 300; Inject 15units if blood sugar is greater 400 05/24/16   Jegede, Olugbemiga E, MD  lidocaine (LIDODERM) 5 % Place 1 patch onto the skin every 12 (twelve) hours. Remove & Discard patch within 12 hours or as directed by MD 08/22/16 08/22/17  Loney Hering, MD  meloxicam (MOBIC) 15 MG tablet Take 1 tablet (15 mg total) by mouth daily. 01/23/17   Cuthriell, Charline Bills, PA-C  metFORMIN (GLUCOPHAGE) 1000 MG tablet Take 1 tablet (1,000 mg total) by mouth 2 (two) times daily with a meal. 04/12/17   Jegede, Olugbemiga E, MD  metoprolol tartrate (LOPRESSOR) 50 MG tablet Take 1  tablet (50 mg total) by mouth 2 (two) times daily. 04/12/17   Tresa Garter, MD  Syringe, Disposable, (30-35CC SYRINGE) 35 ML MISC Use for injecting insulin 01/25/17   Tresa Garter, MD  TRUEPLUS LANCETS 28G MISC Check blood sugar tid 01/25/17   Tresa Garter, MD  Vitamin D, Ergocalciferol, (DRISDOL) 50000 UNITS CAPS capsule Take 1 capsule (50,000 Units total) by mouth every 7 (seven) days. Patient not taking: Reported on 04/12/2017 05/24/13   Reyne Dumas, MD    Objective:   Vitals:   04/12/17 1224  BP: (!) 150/91  Pulse: 70  Resp: 16  Temp: 98 F (36.7 C)  TempSrc:  Oral  SpO2: 96%  Weight: 206 lb (93.4 kg)   Exam General appearance : Awake, alert, not in any distress. Speech Clear. Not toxic looking HEENT: Atraumatic and Normocephalic, pupils equally reactive to light and accomodation Neck: Supple, no JVD. No cervical lymphadenopathy.  Chest: Good air entry bilaterally, no added sounds  CVS: S1 S2 regular, no murmurs.  Abdomen: Bowel sounds present, Non tender and not distended with no gaurding, rigidity or rebound. Extremities: B/L Lower Ext shows no edema, both legs are warm to touch Neurology: Awake alert, and oriented X 3, CN II-XII intact, Non focal Skin: No Rash  Data Review Lab Results  Component Value Date   HGBA1C 7.8 06/30/2016   HGBA1C 13.5 01/21/2016   HGBA1C 7.6 05/08/2013    Assessment & Plan   1. Uncontrolled type 2 diabetes mellitus with ketoacidosis without coma, without long-term current use of insulin (HCC)  - POCT glycosylated hemoglobin (Hb A1C) - POCT glucose (manual entry)  2. Neuropathic pain of ankle, left  - gabapentin (NEURONTIN) 100 MG capsule; Take 1 capsule (100 mg total) by mouth 3 (three) times daily.  Dispense: 90 capsule; Refill: 3  3. Hypertriglyceridemia  - gemfibrozil (LOPID) 600 MG tablet; Take 1 tablet (600 mg total) by mouth 2 (two) times daily before a meal.  Dispense: 180 tablet; Refill: 3  4. Type 2 diabetes mellitus without complication, without long-term current use of insulin (HCC)  - glimepiride (AMARYL) 1 MG tablet; Take 1 tablet (1 mg total) by mouth daily with breakfast.  Dispense: 90 tablet; Refill: 3 - metFORMIN (GLUCOPHAGE) 1000 MG tablet; Take 1 tablet (1,000 mg total) by mouth 2 (two) times daily with a meal.  Dispense: 180 tablet; Refill: 3  5. Essential hypertension  - metoprolol tartrate (LOPRESSOR) 50 MG tablet; Take 1 tablet (50 mg total) by mouth 2 (two) times daily.  Dispense: 180 tablet; Refill: 3  Patient have been counseled extensively about nutrition and exercise.  Other issues discussed during this visit include: low cholesterol diet, weight control and daily exercise, foot care, annual eye examinations at Ophthalmology, importance of adherence with medications and regular follow-up. We also discussed long term complications of uncontrolled diabetes and hypertension.   Return in about 3 months (around 07/13/2017) for Hemoglobin A1C and Follow up, DM, Follow up HTN.  The patient was given clear instructions to go to ER or return to medical center if symptoms don't improve, worsen or new problems develop. The patient verbalized understanding. The patient was told to call to get lab results if they haven't heard anything in the next week.   This note has been created with Surveyor, quantity. Any transcriptional errors are unintentional.    Angelica Chessman, MD, [place], [place], [name], [place] and [facility] [place], [place]  04/12/2017, 12:33 PM

## 2017-04-18 ENCOUNTER — Emergency Department: Payer: Self-pay

## 2017-04-18 ENCOUNTER — Encounter: Payer: Self-pay | Admitting: Emergency Medicine

## 2017-04-18 ENCOUNTER — Emergency Department
Admission: EM | Admit: 2017-04-18 | Discharge: 2017-04-18 | Disposition: A | Discharge status: Home / Self Care | Payer: Self-pay | Attending: Emergency Medicine | Admitting: Emergency Medicine

## 2017-04-18 DIAGNOSIS — I10 Essential (primary) hypertension: Secondary | ICD-10-CM | POA: Insufficient documentation

## 2017-04-18 DIAGNOSIS — Z794 Long term (current) use of insulin: Secondary | ICD-10-CM | POA: Insufficient documentation

## 2017-04-18 DIAGNOSIS — F17210 Nicotine dependence, cigarettes, uncomplicated: Secondary | ICD-10-CM | POA: Insufficient documentation

## 2017-04-18 DIAGNOSIS — J209 Acute bronchitis, unspecified: Secondary | ICD-10-CM | POA: Insufficient documentation

## 2017-04-18 DIAGNOSIS — E1040 Type 1 diabetes mellitus with diabetic neuropathy, unspecified: Secondary | ICD-10-CM | POA: Insufficient documentation

## 2017-04-18 MED ORDER — PREDNISONE 10 MG PO TABS: ORAL_TABLET | ORAL | 0 refills | Status: DC

## 2017-04-18 MED ORDER — IBUPROFEN 600 MG PO TABS
600.0000 mg | ORAL_TABLET | Freq: Once | ORAL | Status: AC
  Administered 2017-04-18: 600 mg via ORAL
  Filled 2017-04-18: qty 1

## 2017-04-18 MED ORDER — DOXYCYCLINE HYCLATE 100 MG PO CAPS: 100.0000 mg | ORAL_CAPSULE | Freq: Two times a day (BID) | ORAL | 0 refills | Status: DC

## 2017-04-18 MED ORDER — ALBUTEROL SULFATE HFA 108 (90 BASE) MCG/ACT IN AERS: 2.0000 | INHALATION_SPRAY | Freq: Four times a day (QID) | RESPIRATORY_TRACT | 2 refills | Status: DC | PRN

## 2017-04-18 MED ORDER — IPRATROPIUM-ALBUTEROL 0.5-2.5 (3) MG/3ML IN SOLN
3.0000 mL | Freq: Once | RESPIRATORY_TRACT | Status: AC
  Administered 2017-04-18: 3 mL via RESPIRATORY_TRACT
  Filled 2017-04-18: qty 3

## 2017-04-18 NOTE — Discharge Instructions (Signed)
Follow-up with your primary care provider if any continued problems. Begin taking prednisone as directed. Doxycycline and twice a day for the next 10 days and Proventil inhaler 2 puffs every 6 hours as needed for cough or wheezing.Tylenol or ibuprofen as needed for fever or body aches. Increase fluids. Decrease smoking or enroll in  a smoking cessation class .

## 2017-04-18 NOTE — ED Notes (Addendum)
Patient c/o bilateral ear pain, sinus congestion, shivers and body aches since Saturday

## 2017-04-18 NOTE — ED Triage Notes (Signed)
Pt c/o generalized body aches and chills since Saturday. Pt with face mask in place.

## 2017-04-18 NOTE — ED Provider Notes (Signed)
[facility] Emergency Department Provider Note  ____________________________________________   First MD Initiated Contact with Patient 04/18/17 1133     (approximate)  I have reviewed the triage vital signs and the nursing notes.   HISTORY  Chief Complaint Generalized Body Aches   HPI KERY BATZEL is a 56 y.o. male presents to the emergency room with 3 days of general body aches and chills. Patient states he has had a yellow productive cough during this time. He is taken some over-the-counter cough medication without any improvement. He continues to smoke over one pack of cigarettes per day since 1996. Patient denies any past history of bronchitis or pneumonia.  he rates his pain as 10 over 10.   Past Medical History:  Diagnosis Date  . Blood clot in vein   . Diabetes mellitus   . DVT (deep venous thrombosis) ([place])   . Hypertension     Patient Active Problem List   Diagnosis Date Noted  . Neuropathic pain of ankle, left 01/25/2017  . Adjustment disorder with mixed anxiety and depressed mood 08/27/2016  . Hypertriglyceridemia 06/30/2016  . Thrombocytopenia, unspecified ([place]) 12/06/2012  . Diabetic neuropathy ([place]) 12/05/2012  . Tobacco use disorder 12/05/2012  . Dyslipidemia 12/05/2012  . Uncontrolled type 2 diabetes mellitus with ketoacidosis without coma, without long-term current use of insulin ([place]) 05/11/2011  . Essential hypertension 05/11/2011    Past Surgical History:  Procedure Laterality Date  . APPENDECTOMY    . VEIN LIGATION AND STRIPPING     of left leg    Prior to Admission medications   Medication Sig Start Date End Date Taking? Authorizing Provider  acetaminophen (TYLENOL) 325 MG tablet Take 650 mg by mouth every 6 (six) hours as needed.    [provider]  albuterol (PROVENTIL HFA;VENTOLIN HFA) 108 (90 Base) MCG/ACT inhaler Inhale 2 puffs into the lungs every 6 (six) hours as needed for wheezing or shortness of  breath. 04/18/17   Johnn Hai, PA-C  aspirin EC 325 MG tablet Take 1 tablet (325 mg total) by mouth daily. 02/08/17 02/08/18  Earleen Newport, MD  Blood Glucose Monitoring Suppl (TRUE METRIX METER) w/Device KIT 1 each by Does not apply route 3 (three) times daily. 01/25/17   Tresa Garter, MD  buPROPion (WELLBUTRIN) 100 MG tablet Take 1 tablet (100 mg total) by mouth 2 (two) times daily. 01/25/17   Tresa Garter, MD  busPIRone (BUSPAR) 10 MG tablet Take 1 tablet (10 mg total) by mouth 3 (three) times daily. 01/25/17   Tresa Garter, MD  citalopram (CELEXA) 10 MG tablet Take 1 tablet (10 mg total) by mouth daily. 04/12/17   Tresa Garter, MD  doxycycline (VIBRAMYCIN) 100 MG capsule Take 1 capsule (100 mg total) by mouth 2 (two) times daily. 04/18/17   Johnn Hai, PA-C  gabapentin (NEURONTIN) 100 MG capsule Take 1 capsule (100 mg total) by mouth 3 (three) times daily. 04/12/17   Tresa Garter, MD  gemfibrozil (LOPID) 600 MG tablet Take 1 tablet (600 mg total) by mouth 2 (two) times daily before a meal. 04/12/17   Jegede, Marlena Clipper, MD  glimepiride (AMARYL) 1 MG tablet Take 1 tablet (1 mg total) by mouth daily with breakfast. 04/12/17   Angelica Chessman E, MD  glucose blood (TRUE METRIX BLOOD GLUCOSE TEST) test strip Use as instructed 01/31/17   Angelica Chessman E, MD  insulin aspart (NOVOLOG) 100 UNIT/ML injection Inject 10units if blood sugar is  greater 300; Inject 15units if blood sugar is greater 400 05/24/16   Jegede, Olugbemiga E, MD  lidocaine (LIDODERM) 5 % Place 1 patch onto the skin every 12 (twelve) hours. Remove & Discard patch within 12 hours or as directed by MD 08/22/16 08/22/17  Loney Hering, MD  meloxicam (MOBIC) 15 MG tablet Take 1 tablet (15 mg total) by mouth daily. 01/23/17   Cuthriell, Charline Bills, PA-C  metFORMIN (GLUCOPHAGE) 1000 MG tablet Take 1 tablet (1,000 mg total) by mouth 2 (two) times daily with a meal. 04/12/17    Jegede, Olugbemiga E, MD  metoprolol tartrate (LOPRESSOR) 50 MG tablet Take 1 tablet (50 mg total) by mouth 2 (two) times daily. 04/12/17   Tresa Garter, MD  predniSONE (DELTASONE) 10 MG tablet Take 3 tablet every day for the next 5 days 04/18/17   Johnn Hai, PA-C  Syringe, Disposable, (30-35CC SYRINGE) 35 ML MISC Use for injecting insulin 01/25/17   Tresa Garter, MD  TRUEPLUS LANCETS 28G MISC Check blood sugar tid 01/25/17   Tresa Garter, MD  Vitamin D, Ergocalciferol, (DRISDOL) 50000 UNITS CAPS capsule Take 1 capsule (50,000 Units total) by mouth every 7 (seven) days. Patient not taking: Reported on 04/12/2017 05/24/13   Reyne Dumas, MD    Allergies Erythromycin base; Benzodiazepines; Xanax [alprazolam]; Methocarbamol; and Sulfa antibiotics  Family History  Problem Relation Age of Onset  . Heart disease Mother   . Diabetes Father   . Heart disease Father     Social History Social History  Substance Use Topics  . Smoking status: Current Every Day Smoker    Packs/day: 1.00    Types: Cigarettes  . Smokeless tobacco: Never Used  . Alcohol use No    Review of Systems Constitutional: subjective fever/chills Eyes: No visual changes. ENT: No sore throat. Cardiovascular: Denies chest pain. Respiratory: Denies shortness of breath. positive productive cough. Gastrointestinal:  No nausea, no vomiting. Musculoskeletal: Negative for back pain. Skin: Negative for rash. Neurological: Negative for  focal weakness or numbness. ____________________________________________   PHYSICAL EXAM:  VITAL SIGNS: ED Triage Vitals  Enc Vitals Group     BP 04/18/17 1100 (!) 167/86     Pulse --      Resp 04/18/17 1100 18     Temp 04/18/17 1100 98.7 F (37.1 C)     Temp Source 04/18/17 1100 Oral     SpO2 04/18/17 1100 95 %     Weight 04/18/17 1101 206 lb (93.4 kg)     Height 04/18/17 1101 '5\' 11"'  (1.803 m)     Head Circumference --      Peak Flow --      Pain  Score 04/18/17 1106 10     Pain Loc --      Pain Edu? --      Excl. in [place]? --    Constitutional: Alert and oriented. Well appearing and in no acute distress. Eyes: Conjunctivae are normal.  Head: Atraumatic. Nose: mild congestion/rhinnorhea. Mouth/Throat: Mucous membranes are moist.  Oropharynx non-erythematous.  Neck: No stridor.   Hematological/Lymphatic/Immunilogical: No cervical lymphadenopathy. Cardiovascular: Normal rate, regular rhythm. Grossly normal heart sounds.  Good peripheral circulation. Respiratory: Normal respiratory effort.  No retractions. Lungs bilateral rales and rhonchi with congested cough. No wheezing was heard. Patient is able to speak in complete sentences without any difficulty. Gastrointestinal: Soft and nontender. No distention.  Musculoskeletal: Moves Upper and lower extremities without any difficulty. Normal gait was noted. Neurologic:  Normal speech and  language. No gross focal neurologic deficits are appreciated.  Skin:  Skin is warm, dry and intact. No rash noted. Psychiatric: Mood and affect are normal. Speech and behavior are normal.  ____________________________________________   LABS (all labs ordered are listed, but only abnormal results are displayed)  Labs Reviewed - No data to display   RADIOLOGY  Dg Chest 2 View  Result Date: 04/18/2017 CLINICAL DATA:  Pt c/o congestion, weakness, cough and chest pain since Saturday. Hx of hbp, bronchitis, diabetes. Smoker. EXAM: CHEST  2 VIEW COMPARISON:  02/25/2016 FINDINGS: Mild hyperinflation. Midline trachea. Normal heart size. Atherosclerosis in the transverse aorta. No pleural effusion or pneumothorax. Clear lungs. IMPRESSION: Hyperinflation, without acute disease. Aortic Atherosclerosis (ICD10-I70.0). Electronically Signed   By: Abigail Miyamoto M.D.   On: 04/18/2017 13:09    ____________________________________________   PROCEDURES  Procedure(s) performed: None  Procedures  Critical Care  performed: No  ____________________________________________   INITIAL IMPRESSION / ASSESSMENT AND PLAN / ED COURSE    Patient was reassured that he did not have pneumonia per x-ray.he improved after his first DuoNeb treatment We discussed discontinuing smoking however he states plainly that most likely he will never quit smoking. Patient was discharged after a second DuoNeb treatment.  Patient is given prescription for doxycycline 100 mg twice a day along with a tapering dose of prednisone. He was also given a prescription for pro-air inhaler 2 puffs 4 times a day as needed for coughing or wheezing.  ____________________________________________   FINAL CLINICAL IMPRESSION(S) / ED DIAGNOSES  Final diagnoses:  Acute bronchitis, unspecified organism  Cigarette smoker      NEW MEDICATIONS STARTED DURING THIS VISIT:  Discharge Medication List as of 04/18/2017  1:52 PM    START taking these medications   Details  albuterol (PROVENTIL HFA;VENTOLIN HFA) 108 (90 Base) MCG/ACT inhaler Inhale 2 puffs into the lungs every 6 (six) hours as needed for wheezing or shortness of breath., Starting Tue 04/18/2017, Print    doxycycline (VIBRAMYCIN) 100 MG capsule Take 1 capsule (100 mg total) by mouth 2 (two) times daily., Starting Tue 04/18/2017, Print    predniSONE (DELTASONE) 10 MG tablet Take 3 tablet every day for the next 5 days, Print         Note:  This document was prepared using Dragon voice recognition software and may include unintentional dictation errors.    Johnn Hai, PA-C 04/18/17 [place], [place], MD 04/19/17 1131

## 2017-05-02 ENCOUNTER — Other Ambulatory Visit: Payer: Self-pay

## 2017-05-02 DIAGNOSIS — M25572 Pain in left ankle and joints of left foot: Secondary | ICD-10-CM

## 2017-05-11 ENCOUNTER — Encounter: Payer: Self-pay | Admitting: Vascular Surgery

## 2017-05-11 ENCOUNTER — Inpatient Hospital Stay (HOSPITAL_COMMUNITY): Admission: RE | Admit: 2017-05-11 | Payer: Self-pay | Source: Ambulatory Visit

## 2017-05-15 ENCOUNTER — Other Ambulatory Visit: Payer: Self-pay | Admitting: Internal Medicine

## 2017-05-15 DIAGNOSIS — E1110 Type 2 diabetes mellitus with ketoacidosis without coma: Secondary | ICD-10-CM

## 2017-05-15 DIAGNOSIS — E119 Type 2 diabetes mellitus without complications: Secondary | ICD-10-CM

## 2017-05-15 MED ORDER — METFORMIN HCL 1000 MG PO TABS: 1000.0000 mg | ORAL_TABLET | Freq: Two times a day (BID) | ORAL | 3 refills | Status: DC

## 2017-05-15 MED ORDER — GLIMEPIRIDE 1 MG PO TABS: 1.0000 mg | ORAL_TABLET | Freq: Every day | ORAL | 3 refills | Status: DC

## 2017-05-31 DIAGNOSIS — E1140 Type 2 diabetes mellitus with diabetic neuropathy, unspecified: Secondary | ICD-10-CM

## 2017-06-14 ENCOUNTER — Ambulatory Visit: Payer: Self-pay | Admitting: Internal Medicine

## 2017-06-21 ENCOUNTER — Ambulatory Visit: Payer: Self-pay | Attending: Internal Medicine | Admitting: Internal Medicine

## 2017-06-21 ENCOUNTER — Ambulatory Visit: Payer: Self-pay | Admitting: Licensed Clinical Social Worker

## 2017-06-21 VITALS — BP 142/84 | HR 85 | Temp 97.8°F | Resp 18 | Ht 71.0 in | Wt 203.0 lb

## 2017-06-21 DIAGNOSIS — I10 Essential (primary) hypertension: Secondary | ICD-10-CM | POA: Insufficient documentation

## 2017-06-21 DIAGNOSIS — Z86718 Personal history of other venous thrombosis and embolism: Secondary | ICD-10-CM | POA: Insufficient documentation

## 2017-06-21 DIAGNOSIS — M545 Low back pain: Secondary | ICD-10-CM | POA: Insufficient documentation

## 2017-06-21 DIAGNOSIS — F17200 Nicotine dependence, unspecified, uncomplicated: Secondary | ICD-10-CM

## 2017-06-21 DIAGNOSIS — F4323 Adjustment disorder with mixed anxiety and depressed mood: Secondary | ICD-10-CM | POA: Insufficient documentation

## 2017-06-21 DIAGNOSIS — Z5900 Homelessness unspecified: Secondary | ICD-10-CM

## 2017-06-21 DIAGNOSIS — E781 Pure hyperglyceridemia: Secondary | ICD-10-CM | POA: Insufficient documentation

## 2017-06-21 DIAGNOSIS — M792 Neuralgia and neuritis, unspecified: Secondary | ICD-10-CM

## 2017-06-21 DIAGNOSIS — Z760 Encounter for issue of repeat prescription: Secondary | ICD-10-CM | POA: Insufficient documentation

## 2017-06-21 DIAGNOSIS — M25572 Pain in left ankle and joints of left foot: Secondary | ICD-10-CM | POA: Insufficient documentation

## 2017-06-21 DIAGNOSIS — Z7982 Long term (current) use of aspirin: Secondary | ICD-10-CM | POA: Insufficient documentation

## 2017-06-21 DIAGNOSIS — Z794 Long term (current) use of insulin: Secondary | ICD-10-CM | POA: Insufficient documentation

## 2017-06-21 DIAGNOSIS — E1110 Type 2 diabetes mellitus with ketoacidosis without coma: Secondary | ICD-10-CM

## 2017-06-21 DIAGNOSIS — E1140 Type 2 diabetes mellitus with diabetic neuropathy, unspecified: Secondary | ICD-10-CM | POA: Insufficient documentation

## 2017-06-21 DIAGNOSIS — Z79899 Other long term (current) drug therapy: Secondary | ICD-10-CM | POA: Insufficient documentation

## 2017-06-21 DIAGNOSIS — E1165 Type 2 diabetes mellitus with hyperglycemia: Secondary | ICD-10-CM | POA: Insufficient documentation

## 2017-06-21 DIAGNOSIS — Z720 Tobacco use: Secondary | ICD-10-CM | POA: Insufficient documentation

## 2017-06-21 DIAGNOSIS — G5792 Unspecified mononeuropathy of left lower limb: Secondary | ICD-10-CM

## 2017-06-21 LAB — GLUCOSE, POCT (MANUAL RESULT ENTRY): POC GLUCOSE: 201 mg/dL — AB (ref 70–99)

## 2017-06-21 MED ORDER — BUPROPION HCL 100 MG PO TABS: 100.0000 mg | ORAL_TABLET | Freq: Two times a day (BID) | ORAL | 3 refills | Status: DC

## 2017-06-21 MED ORDER — GABAPENTIN 100 MG PO CAPS: 100.0000 mg | ORAL_CAPSULE | Freq: Three times a day (TID) | ORAL | 3 refills | Status: DC

## 2017-06-21 MED ORDER — BUSPIRONE HCL 10 MG PO TABS: 10.0000 mg | ORAL_TABLET | Freq: Three times a day (TID) | ORAL | 3 refills | Status: DC

## 2017-06-21 MED ORDER — GEMFIBROZIL 600 MG PO TABS: 600.0000 mg | ORAL_TABLET | Freq: Two times a day (BID) | ORAL | 3 refills | Status: DC

## 2017-06-21 MED ORDER — METOPROLOL TARTRATE 50 MG PO TABS: 50.0000 mg | ORAL_TABLET | Freq: Two times a day (BID) | ORAL | 3 refills | Status: DC

## 2017-06-21 MED ORDER — METFORMIN HCL 1000 MG PO TABS: 1000.0000 mg | ORAL_TABLET | Freq: Two times a day (BID) | ORAL | 3 refills | Status: DC

## 2017-06-21 MED ORDER — CITALOPRAM HYDROBROMIDE 10 MG PO TABS: 10.0000 mg | ORAL_TABLET | Freq: Every day | ORAL | 0 refills | Status: DC

## 2017-06-21 MED ORDER — GLIMEPIRIDE 1 MG PO TABS: 1.0000 mg | ORAL_TABLET | Freq: Every day | ORAL | 3 refills | Status: DC

## 2017-06-21 NOTE — Progress Notes (Signed)
[name], is a 56 y.o. male  PFX:[id number]  HGD:[id number]  DOB - 08/21/1960     Subjective:   [name] is a 56 y.o. male with history of DVT, poorly controlled diabetes mellitus, hypertension, ongoing tobacco use disorder and homelessness who presents here today for a follow up visit and medication refill. Patient is facing a lot of hardship, trying to move out of current apartment because he feels unsafe there, most of the time on the street. He has no medical complaint today other than ongoing low back and left ankle pain. He also need refill of his medications. He continues to smoke heavily but contemplating quitting, just not quite ready yet. He denies any suicidal ideation or thoughts. Patient has No headache, No chest pain, No abdominal pain - No Nausea, No new weakness tingling or numbness, No Cough - SOB.  No problems updated.  ALLERGIES: Allergies  Allergen Reactions  . Erythromycin Base Anxiety  . Benzodiazepines     PT STATES HE CANNOT TAKE THESE BECAUSE THEY ALMOST KILLED HIM  . Xanax [Alprazolam]   . Methocarbamol Anxiety and Other (See Comments)    Stomach cramping, weakness  . Sulfa Antibiotics Nausea And Vomiting and Other (See Comments)    Cramping in stomach and hyperventilate.     PAST MEDICAL HISTORY: Past Medical History:  Diagnosis Date  . Blood clot in vein   . Diabetes mellitus   . DVT (deep venous thrombosis) ([place])   . Hypertension     MEDICATIONS AT HOME: Prior to Admission medications   Medication Sig Start Date End Date Taking? Authorizing Provider  acetaminophen (TYLENOL) 325 MG tablet Take 650 mg by mouth every 6 (six) hours as needed.    [provider]  albuterol (PROVENTIL HFA;VENTOLIN HFA) 108 (90 Base) MCG/ACT inhaler Inhale 2 puffs into the lungs every 6 (six) hours as needed for wheezing or shortness of breath. 04/18/17   Johnn Hai, PA-C  aspirin EC 325 MG tablet Take 1 tablet (325 mg total) by mouth daily. 02/08/17  02/08/18  Earleen Newport, MD  Blood Glucose Monitoring Suppl (TRUE METRIX METER) w/Device KIT 1 each by Does not apply route 3 (three) times daily. 01/25/17   Tresa Garter, MD  buPROPion (WELLBUTRIN) 100 MG tablet Take 1 tablet (100 mg total) by mouth 2 (two) times daily. 06/21/17   Tresa Garter, MD  busPIRone (BUSPAR) 10 MG tablet Take 1 tablet (10 mg total) by mouth 3 (three) times daily. 06/21/17   Tresa Garter, MD  citalopram (CELEXA) 10 MG tablet Take 1 tablet (10 mg total) by mouth daily. 06/21/17   Tresa Garter, MD  gabapentin (NEURONTIN) 100 MG capsule Take 1 capsule (100 mg total) by mouth 3 (three) times daily. 06/21/17   Tresa Garter, MD  gemfibrozil (LOPID) 600 MG tablet Take 1 tablet (600 mg total) by mouth 2 (two) times daily before a meal. 06/21/17   Jamai Dolce, Marlena Clipper, MD  glimepiride (AMARYL) 1 MG tablet Take 1 tablet (1 mg total) by mouth daily with breakfast. 06/21/17   Angelica Chessman E, MD  glucose blood (TRUE METRIX BLOOD GLUCOSE TEST) test strip Use as instructed 01/31/17   Angelica Chessman E, MD  insulin aspart (NOVOLOG) 100 UNIT/ML injection Inject 10units if blood sugar is greater 300; Inject 15units if blood sugar is greater 400 05/24/16   Laretta Pyatt E, MD  lidocaine (LIDODERM) 5 % Place 1 patch onto the skin every 12 (twelve) hours.  Remove & Discard patch within 12 hours or as directed by MD 08/22/16 08/22/17  Webster, Allison P, MD  meloxicam (MOBIC) 15 MG tablet Take 1 tablet (15 mg total) by mouth daily. 01/23/17   Cuthriell, Jonathan D, PA-C  metFORMIN (GLUCOPHAGE) 1000 MG tablet Take 1 tablet (1,000 mg total) by mouth 2 (two) times daily with a meal. 06/21/17   Jegede, Olugbemiga E, MD  metoprolol tartrate (LOPRESSOR) 50 MG tablet Take 1 tablet (50 mg total) by mouth 2 (two) times daily. 06/21/17   Jegede, Olugbemiga E, MD  Syringe, Disposable, (30-35CC SYRINGE) 35 ML MISC Use for injecting insulin 01/25/17   Jegede,  Olugbemiga E, MD  TRUEPLUS LANCETS 28G MISC Check blood sugar tid 01/25/17   Jegede, Olugbemiga E, MD  Vitamin D, Ergocalciferol, (DRISDOL) 50000 UNITS CAPS capsule Take 1 capsule (50,000 Units total) by mouth every 7 (seven) days. Patient not taking: Reported on 04/12/2017 05/24/13   Abrol, Nayana, MD    Objective:   Vitals:   06/21/17 1338  BP: (!) 142/84  Pulse: 85  Resp: 18  Temp: 97.8 F (36.6 C)  TempSrc: Oral  SpO2: 97%  Weight: 203 lb (92.1 kg)  Height: 5' 11" (1.803 m)   Exam General appearance : Awake, alert, not in any distress. Speech Clear. Not toxic looking HEENT: Atraumatic and Normocephalic, pupils equally reactive to light and accomodation Neck: Supple, no JVD. No cervical lymphadenopathy.  Chest: Good air entry bilaterally, no added sounds  CVS: S1 S2 regular, no murmurs.  Abdomen: Bowel sounds present, Non tender and not distended with no gaurding, rigidity or rebound. Extremities: B/L Lower Ext shows no edema, both legs are warm to touch Neurology: Awake alert, and oriented X 3, CN II-XII intact, Non focal Skin: No Rash  Data Review Lab Results  Component Value Date   HGBA1C 7.7 04/12/2017   HGBA1C 7.8 06/30/2016   HGBA1C 13.5 01/21/2016    Assessment & Plan   1. Poorly controlled type 2 diabetes mellitus with neuropathy (HCC)  - Glucose (CBG) - gabapentin (NEURONTIN) 100 MG capsule; Take 1 capsule (100 mg total) by mouth 3 (three) times daily.  Dispense: 90 capsule; Refill: 3 - glimepiride (AMARYL) 1 MG tablet; Take 1 tablet (1 mg total) by mouth daily with breakfast.  Dispense: 90 tablet; Refill: 3 - metFORMIN (GLUCOPHAGE) 1000 MG tablet; Take 1 tablet (1,000 mg total) by mouth 2 (two) times daily with a meal.  Dispense: 180 tablet; Refill: 3  2. Adjustment disorder with mixed anxiety and depressed mood  - busPIRone (BUSPAR) 10 MG tablet; Take 1 tablet (10 mg total) by mouth 3 (three) times daily.  Dispense: 90 tablet; Refill: 3 - citalopram  (CELEXA) 10 MG tablet; Take 1 tablet (10 mg total) by mouth daily.  Dispense: 30 tablet; Refill: 0 - buPROPion (WELLBUTRIN) 100 MG tablet; Take 1 tablet (100 mg total) by mouth 2 (two) times daily.  Dispense: 60 tablet; Refill: 3  3. Neuropathic pain of ankle, left  - gabapentin (NEURONTIN) 100 MG capsule; Take 1 capsule (100 mg total) by mouth 3 (three) times daily.  Dispense: 90 capsule; Refill: 3  4. Hypertriglyceridemia  - gemfibrozil (LOPID) 600 MG tablet; Take 1 tablet (600 mg total) by mouth 2 (two) times daily before a meal.  Dispense: 180 tablet; Refill: 3  5. Tobacco use disorder  - buPROPion (WELLBUTRIN) 100 MG tablet; Take 1 tablet (100 mg total) by mouth 2 (two) times daily.  Dispense: 60 tablet; Refill: 3  [place]   was counseled on the dangers of tobacco use, and was advised to quit. Reviewed strategies to maximize success, including removing cigarettes and smoking materials from environment, stress management and support of family/friends.  6. Essential hypertension  - metoprolol tartrate (LOPRESSOR) 50 MG tablet; Take 1 tablet (50 mg total) by mouth 2 (two) times daily.  Dispense: 180 tablet; Refill: 3  Patient have been counseled extensively about nutrition and exercise. Other issues discussed during this visit include: low cholesterol diet, weight control and daily exercise, foot care, annual eye examinations at Ophthalmology, importance of adherence with medications and regular follow-up. We also discussed long term complications of uncontrolled diabetes and hypertension.   Return in about 3 months (around 09/19/2017) for Hemoglobin A1C and Follow up, DM, Follow up HTN, Follow up Pain and comorbidities.  The patient was given clear instructions to go to ER or return to medical center if symptoms don't improve, worsen or new problems develop. The patient verbalized understanding. The patient was told to call to get lab results if they haven't heard anything in the next week.     This note has been created with Surveyor, quantity. Any transcriptional errors are unintentional.    Angelica Chessman, MD, MHA, Karilyn C[place], [place] [facility] [place], [place]   06/21/2017, 2:13 PM

## 2017-06-21 NOTE — Patient Instructions (Signed)
Diabetes and Foot Care Diabetes may cause you to have problems because of poor blood supply (circulation) to your feet and legs. This may cause the skin on your feet to become thinner, break easier, and heal more slowly. Your skin may become dry, and the skin may peel and crack. You may also have nerve damage in your legs and feet causing decreased feeling in them. You may not notice minor injuries to your feet that could lead to infections or more serious problems. Taking care of your feet is one of the most important things you can do for yourself. Follow these instructions at home:  Wear shoes at all times, even in the house. Do not go barefoot. Bare feet are easily injured.  Check your feet daily for blisters, cuts, and redness. If you cannot see the bottom of your feet, use a mirror or ask someone for help.  Wash your feet with warm water (do not use hot water) and mild soap. Then pat your feet and the areas between your toes until they are completely dry. Do not soak your feet as this can dry your skin.  Apply a moisturizing lotion or petroleum jelly (that does not contain alcohol and is unscented) to the skin on your feet and to dry, brittle toenails. Do not apply lotion between your toes.  Trim your toenails straight across. Do not dig under them or around the cuticle. File the edges of your nails with an emery board or nail file.  Do not cut corns or calluses or try to remove them with medicine.  Wear clean socks or stockings every day. Make sure they are not too tight. Do not wear knee-high stockings since they may decrease blood flow to your legs.  Wear shoes that fit properly and have enough cushioning. To break in new shoes, wear them for just a few hours a day. This prevents you from injuring your feet. Always look in your shoes before you put them on to be sure there are no objects inside.  Do not cross your legs. This may decrease the blood flow to your feet.  If you find a  minor scrape, cut, or break in the skin on your feet, keep it and the skin around it clean and dry. These areas may be cleansed with mild soap and water. Do not cleanse the area with peroxide, alcohol, or iodine.  When you remove an adhesive bandage, be sure not to damage the skin around it.  If you have a wound, look at it several times a day to make sure it is healing.  Do not use heating pads or hot water bottles. They may burn your skin. If you have lost feeling in your feet or legs, you may not know it is happening until it is too late.  Make sure your health care provider performs a complete foot exam at least annually or more often if you have foot problems. Report any cuts, sores, or bruises to your health care provider immediately. Contact a health care provider if:  You have an injury that is not healing.  You have cuts or breaks in the skin.  You have an ingrown nail.  You notice redness on your legs or feet.  You feel burning or tingling in your legs or feet.  You have pain or cramps in your legs and feet.  Your legs or feet are numb.  Your feet always feel cold. Get help right away if:  There is increasing   redness, swelling, or pain in or around a wound.  There is a red line that goes up your leg.  Pus is coming from a wound.  You develop a fever or as directed by your health care provider.  You notice a bad smell coming from an ulcer or wound. This information is not intended to replace advice given to you by your health care provider. Make sure you discuss any questions you have with your health care provider. Document Released: 06/17/2000 Document Revised: 11/26/2015 Document Reviewed: 11/27/2012 Elsevier Interactive Patient Education  2017 Elsevier Inc. Diabetes Mellitus and Nutrition When you have diabetes (diabetes mellitus), it is very important to have healthy eating habits because your blood sugar (glucose) levels are greatly affected by what you eat  and drink. Eating healthy foods in the appropriate amounts, at about the same times every day, can help you:  Control your blood glucose.  Lower your risk of heart disease.  Improve your blood pressure.  Reach or maintain a healthy weight.  Every person with diabetes is different, and each person has different needs for a meal plan. Your health care provider may recommend that you work with a diet and nutrition specialist (dietitian) to make a meal plan that is best for you. Your meal plan may vary depending on factors such as:  The calories you need.  The medicines you take.  Your weight.  Your blood glucose, blood pressure, and cholesterol levels.  Your activity level.  Other health conditions you have, such as heart or kidney disease.  How do carbohydrates affect me? Carbohydrates affect your blood glucose level more than any other type of food. Eating carbohydrates naturally increases the amount of glucose in your blood. Carbohydrate counting is a method for keeping track of how many carbohydrates you eat. Counting carbohydrates is important to keep your blood glucose at a healthy level, especially if you use insulin or take certain oral diabetes medicines. It is important to know how many carbohydrates you can safely have in each meal. This is different for every person. Your dietitian can help you calculate how many carbohydrates you should have at each meal and for snack. Foods that contain carbohydrates include:  Bread, cereal, rice, pasta, and crackers.  Potatoes and corn.  Peas, beans, and lentils.  Milk and yogurt.  Fruit and juice.  Desserts, such as cakes, cookies, ice cream, and candy.  How does alcohol affect me? Alcohol can cause a sudden decrease in blood glucose (hypoglycemia), especially if you use insulin or take certain oral diabetes medicines. Hypoglycemia can be a life-threatening condition. Symptoms of hypoglycemia (sleepiness, dizziness, and  confusion) are similar to symptoms of having too much alcohol. If your health care provider says that alcohol is safe for you, follow these guidelines:  Limit alcohol intake to no more than 1 drink per day for nonpregnant women and 2 drinks per day for men. One drink equals 12 oz of beer, 5 oz of wine, or 1 oz of hard liquor.  Do not drink on an empty stomach.  Keep yourself hydrated with water, diet soda, or unsweetened iced tea.  Keep in mind that regular soda, juice, and other mixers may contain a lot of sugar and must be counted as carbohydrates.  What are tips for following this plan? Reading food labels  Start by checking the serving size on the label. The amount of calories, carbohydrates, fats, and other nutrients listed on the label are based on one serving of the food.   Many foods contain more than one serving per package.  Check the total grams (g) of carbohydrates in one serving. You can calculate the number of servings of carbohydrates in one serving by dividing the total carbohydrates by 15. For example, if a food has 30 g of total carbohydrates, it would be equal to 2 servings of carbohydrates.  Check the number of grams (g) of saturated and trans fats in one serving. Choose foods that have low or no amount of these fats.  Check the number of milligrams (mg) of sodium in one serving. Most people should limit total sodium intake to less than 2,300 mg per day.  Always check the nutrition information of foods labeled as "low-fat" or "nonfat". These foods may be higher in added sugar or refined carbohydrates and should be avoided.  Talk to your dietitian to identify your daily goals for nutrients listed on the label. Shopping  Avoid buying canned, premade, or processed foods. These foods tend to be high in fat, sodium, and added sugar.  Shop around the outside edge of the grocery store. This includes fresh fruits and vegetables, bulk grains, fresh meats, and fresh  dairy. Cooking  Use low-heat cooking methods, such as baking, instead of high-heat cooking methods like deep frying.  Cook using healthy oils, such as olive, canola, or sunflower oil.  Avoid cooking with butter, cream, or high-fat meats. Meal planning  Eat meals and snacks regularly, preferably at the same times every day. Avoid going long periods of time without eating.  Eat foods high in fiber, such as fresh fruits, vegetables, beans, and whole grains. Talk to your dietitian about how many servings of carbohydrates you can eat at each meal.  Eat 4-6 ounces of lean protein each day, such as lean meat, chicken, fish, eggs, or tofu. 1 ounce is equal to 1 ounce of meat, chicken, or fish, 1 egg, or 1/4 cup of tofu.  Eat some foods each day that contain healthy fats, such as avocado, nuts, seeds, and fish. Lifestyle   Check your blood glucose regularly.  Exercise at least 30 minutes 5 or more days each week, or as told by your health care provider.  Take medicines as told by your health care provider.  Do not use any products that contain nicotine or tobacco, such as cigarettes and e-cigarettes. If you need help quitting, ask your health care provider.  Work with a counselor or diabetes educator to identify strategies to manage stress and any emotional and social challenges. What are some questions to ask my health care provider?  Do I need to meet with a diabetes educator?  Do I need to meet with a dietitian?  What number can I call if I have questions?  When are the best times to check my blood glucose? Where to find more information:  American Diabetes Association: diabetes.org/food-and-fitness/food  Academy of Nutrition and Dietetics: www.eatright.org/resources/health/diseases-and-conditions/diabetes  National Institute of Diabetes and Digestive and Kidney Diseases (NIH): www.niddk.nih.gov/health-information/diabetes/overview/diet-eating-physical-activity Summary  A  healthy meal plan will help you control your blood glucose and maintain a healthy lifestyle.  Working with a diet and nutrition specialist (dietitian) can help you make a meal plan that is best for you.  Keep in mind that carbohydrates and alcohol have immediate effects on your blood glucose levels. It is important to count carbohydrates and to use alcohol carefully. This information is not intended to replace advice given to you by your health care provider. Make sure you discuss any questions you have   with your health care provider. Document Released: 03/17/2005 Document Revised: 07/25/2016 Document Reviewed: 07/25/2016 Elsevier Interactive Patient Education  2018 Elsevier Inc. Diabetes Mellitus and Exercise Exercising regularly is important for your overall health, especially when you have diabetes (diabetes mellitus). Exercising is not only about losing weight. It has many health benefits, such as increasing muscle strength and bone density and reducing body fat and stress. This leads to improved fitness, flexibility, and endurance, all of which result in better overall health. Exercise has additional benefits for people with diabetes, including:  Reducing appetite.  Helping to lower and control blood glucose.  Lowering blood pressure.  Helping to control amounts of fatty substances (lipids) in the blood, such as cholesterol and triglycerides.  Helping the body to respond better to insulin (improving insulin sensitivity).  Reducing how much insulin the body needs.  Decreasing the risk for heart disease by: ? Lowering cholesterol and triglyceride levels. ? Increasing the levels of good cholesterol. ? Lowering blood glucose levels.  What is my activity plan? Your health care provider or certified diabetes educator can help you make a plan for the type and frequency of exercise (activity plan) that works for you. Make sure that you:  Do at least 150 minutes of moderate-intensity or  vigorous-intensity exercise each week. This could be brisk walking, biking, or water aerobics. ? Do stretching and strength exercises, such as yoga or weightlifting, at least 2 times a week. ? Spread out your activity over at least 3 days of the week.  Get some form of physical activity every day. ? Do not go more than 2 days in a row without some kind of physical activity. ? Avoid being inactive for more than 90 minutes at a time. Take frequent breaks to walk or stretch.  Choose a type of exercise or activity that you enjoy, and set realistic goals.  Start slowly, and gradually increase the intensity of your exercise over time.  What do I need to know about managing my diabetes?  Check your blood glucose before and after exercising. ? If your blood glucose is higher than 240 mg/dL (13.3 mmol/L) before you exercise, check your urine for ketones. If you have ketones in your urine, do not exercise until your blood glucose returns to normal.  Know the symptoms of low blood glucose (hypoglycemia) and how to treat it. Your risk for hypoglycemia increases during and after exercise. Common symptoms of hypoglycemia can include: ? Hunger. ? Anxiety. ? Sweating and feeling clammy. ? Confusion. ? Dizziness or feeling light-headed. ? Increased heart rate or palpitations. ? Blurry vision. ? Tingling or numbness around the mouth, lips, or tongue. ? Tremors or shakes. ? Irritability.  Keep a rapid-acting carbohydrate snack available before, during, and after exercise to help prevent or treat hypoglycemia.  Avoid injecting insulin into areas of the body that are going to be exercised. For example, avoid injecting insulin into: ? The arms, when playing tennis. ? The legs, when jogging.  Keep records of your exercise habits. Doing this can help you and your health care provider adjust your diabetes management plan as needed. Write down: ? Food that you eat before and after you exercise. ? Blood  glucose levels before and after you exercise. ? The type and amount of exercise you have done. ? When your insulin is expected to peak, if you use insulin. Avoid exercising at times when your insulin is peaking.  When you start a new exercise or activity, work with your health   care provider to make sure the activity is safe for you, and to adjust your insulin, medicines, or food intake as needed.  Drink plenty of water while you exercise to prevent dehydration or heat stroke. Drink enough fluid to keep your urine clear or pale yellow. This information is not intended to replace advice given to you by your health care provider. Make sure you discuss any questions you have with your health care provider. Document Released: 09/10/2003 Document Revised: 01/08/2016 Document Reviewed: 11/30/2015 Elsevier Interactive Patient Education  2018 Elsevier Inc.  

## 2017-06-23 NOTE — BH Specialist Note (Signed)
Integrated Behavioral Health Initial Visit  MRN: [id number] Name: [name]  Number of Integrated Behavioral Health Clinician visits:: 1/6 Session Start time: 2:15 PM  Session End time: 2:45 PM Total time: 30 minutes  Type of Service: Integrated Behavioral Health- Individual/Family Interpretor:No. Interpretor Name and Language: N/A   Warm Hand Off Completed.       SUBJECTIVE: [name] is a 56 y.o. male accompanied by self Patient was referred by Dr. Hyman HopesJegede for depression and anxiety. Patient reports the following symptoms/concerns: feelings of worry and sadness, difficulty sleeping, low energy, and chronic pain Duration of problem: Ongoing Pt reports being diagnosed with Anxiety over twenty years ago; Severity of problem: moderate  OBJECTIVE: Mood: Anxious and Pleasant and Affect: Appropriate Risk of harm to self or others: No plan to harm self or others  LIFE CONTEXT: Family and Social: Pt is separated from spouse. 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 He is interested in quitting in the near future Life Changes: Pt separated from his wife August 2017. He is homeless, experiences chronic pain, and reports difficulty managing chronic medical conditions.   GOALS ADDRESSED: Patient will: 1. Reduce symptoms of: anxiety and depression 2. Increase knowledge and/or ability of: coping skills, healthy habits and self-management skills  3. Demonstrate ability to: Increase adequate support systems for patient/family, Improve medication compliance and Decrease self-medicating behaviors  INTERVENTIONS: Interventions utilized: Motivational Interviewing, Supportive Counseling, Psychoeducation and/or Health Education and Link to [facility]  Standardized Assessments completed: GAD-7 and PHQ  2&9  ASSESSMENT: Patient currently experiencing depression and anxiety triggered by chronic medical conditions, homelessness, and recent separation with spouse. He reports feelings of worry and sadness, difficulty sleeping, low energy, and chronic pain.    Patient may benefit from psychoeducation and psychotherapy. LCSWA educated pt on the correlation between's one's physical and mental health. Pt successfully identified healthy coping skills to decrease symptoms. He is participating in medicine management with PCP. Pt agreed to initiate behavioral health services with [facility] near his residence. LCSWA provided pt with information on the [facility] and [employer] [facility] to assist with pt's goal in obtaining safe independent housing. He is knowledgeable about local shelters for emergency housing.   PLAN: 1. Follow up with behavioral health clinician on : Pt was encouraged to contact LCSWA if symptoms worsen or fail to improve to [facility]. 2. Behavioral recommendations: LCSWA recommends that pt apply healthy coping skills discussed, comply with medication management, and utilize provided resources. Pt is encouraged to schedule follow up appointment with LCSWA 3. Referral(s): [facility] and [facility]:  Housing 4. "From scale of 1-10, how likely are you to follow plan?": 10/10  Bridgett LarssonJasmine D Lewis, LCSW 06/23/17 9:18 AM

## 2017-09-13 ENCOUNTER — Telehealth: Payer: Self-pay | Admitting: Internal Medicine

## 2017-09-13 DIAGNOSIS — E1165 Type 2 diabetes mellitus with hyperglycemia: Secondary | ICD-10-CM

## 2017-09-13 DIAGNOSIS — E1140 Type 2 diabetes mellitus with diabetic neuropathy, unspecified: Secondary | ICD-10-CM

## 2017-09-13 DIAGNOSIS — M792 Neuralgia and neuritis, unspecified: Secondary | ICD-10-CM

## 2017-09-13 MED ORDER — GABAPENTIN 100 MG PO CAPS: 100.0000 mg | ORAL_CAPSULE | Freq: Three times a day (TID) | ORAL | 0 refills | Status: DC

## 2017-09-13 NOTE — Telephone Encounter (Signed)
Pt called to request a refill on  -gabapentin (NEURONTIN) 100 MG capsule  Please send to  [id number] -  (N), [place] - [address] Please follow up

## 2017-09-13 NOTE — Telephone Encounter (Signed)
Refilled x 30 days, needs office visit to establish with new provider.

## 2017-09-13 NOTE — Addendum Note (Signed)
Addended by: Santa LighterHAMMER, STACEY K on: 09/13/2017 02:37 PM   Modules accepted: Orders

## 2017-09-19 ENCOUNTER — Other Ambulatory Visit: Payer: Self-pay

## 2017-09-19 ENCOUNTER — Emergency Department
Admission: EM | Admit: 2017-09-19 | Discharge: 2017-09-20 | Disposition: A | Discharge status: Home / Self Care | Payer: Self-pay | Attending: Emergency Medicine | Admitting: Emergency Medicine

## 2017-09-19 ENCOUNTER — Encounter: Payer: Self-pay | Admitting: Emergency Medicine

## 2017-09-19 DIAGNOSIS — G629 Polyneuropathy, unspecified: Secondary | ICD-10-CM | POA: Insufficient documentation

## 2017-09-19 DIAGNOSIS — E1140 Type 2 diabetes mellitus with diabetic neuropathy, unspecified: Secondary | ICD-10-CM | POA: Insufficient documentation

## 2017-09-19 DIAGNOSIS — Z7984 Long term (current) use of oral hypoglycemic drugs: Secondary | ICD-10-CM | POA: Insufficient documentation

## 2017-09-19 DIAGNOSIS — Z7982 Long term (current) use of aspirin: Secondary | ICD-10-CM | POA: Insufficient documentation

## 2017-09-19 DIAGNOSIS — Z79899 Other long term (current) drug therapy: Secondary | ICD-10-CM | POA: Insufficient documentation

## 2017-09-19 DIAGNOSIS — I10 Essential (primary) hypertension: Secondary | ICD-10-CM | POA: Insufficient documentation

## 2017-09-19 DIAGNOSIS — F17210 Nicotine dependence, cigarettes, uncomplicated: Secondary | ICD-10-CM | POA: Insufficient documentation

## 2017-09-19 DIAGNOSIS — R531 Weakness: Secondary | ICD-10-CM

## 2017-09-19 DIAGNOSIS — E1110 Type 2 diabetes mellitus with ketoacidosis without coma: Secondary | ICD-10-CM | POA: Insufficient documentation

## 2017-09-19 LAB — CBC WITH DIFFERENTIAL/PLATELET
BASOS ABS: 0.1 10*3/uL (ref 0–0.1)
Basophils Relative: 1 %
Eosinophils Absolute: 0.5 10*3/uL (ref 0–0.7)
Eosinophils Relative: 7 %
HEMATOCRIT: 53.7 % — AB (ref 40.0–52.0)
Hemoglobin: 18.2 g/dL — ABNORMAL HIGH (ref 13.0–18.0)
LYMPHS ABS: 1.6 10*3/uL (ref 1.0–3.6)
LYMPHS PCT: 21 %
MCH: 31.9 pg (ref 26.0–34.0)
MCHC: 33.9 g/dL (ref 32.0–36.0)
MCV: 94.1 fL (ref 80.0–100.0)
MONO ABS: 0.6 10*3/uL (ref 0.2–1.0)
MONOS PCT: 8 %
NEUTROS ABS: 4.7 10*3/uL (ref 1.4–6.5)
Neutrophils Relative %: 63 %
Platelets: 130 10*3/uL — ABNORMAL LOW (ref 150–440)
RBC: 5.71 MIL/uL (ref 4.40–5.90)
RDW: 13.6 % (ref 11.5–14.5)
WBC: 7.4 10*3/uL (ref 3.8–10.6)

## 2017-09-19 LAB — BASIC METABOLIC PANEL
ANION GAP: 10 (ref 5–15)
BUN: 11 mg/dL (ref 6–20)
CALCIUM: 9.8 mg/dL (ref 8.9–10.3)
CO2: 27 mmol/L (ref 22–32)
Chloride: 104 mmol/L (ref 101–111)
Creatinine, Ser: 0.68 mg/dL (ref 0.61–1.24)
GFR calc Af Amer: >60 mL/min (ref >60)
GFR calc non Af Amer: >60 mL/min (ref >60)
GLUCOSE: 137 mg/dL — AB (ref 65–99)
Potassium: 4.6 mmol/L (ref 3.5–5.1)
Sodium: 141 mmol/L (ref 135–145)

## 2017-09-19 NOTE — ED Notes (Signed)
First nurse note  Presents via ems    Feet has been burning and feel numb  Also feeling dizzy  fsbs 140

## 2017-09-19 NOTE — ED Triage Notes (Signed)
Pt reports that he has been having numbness and tingling in hands and feet. Reports that his anxiety is has been out of control for the last three days.

## 2017-09-19 NOTE — ED Notes (Signed)
Complaining of foot pain and wanting placement since he is homeless. Given sandwich tray and diet coke

## 2017-09-19 NOTE — ED Provider Notes (Signed)
[facility] Emergency Department Provider Note   ____________________________________________   First MD Initiated Contact with Patient 09/19/17 2314     (approximate)  I have reviewed the triage vital signs and the nursing notes.   HISTORY  Chief Complaint Numbness; Tinnitus; and Panic Attack    HPI [name] is a 57 y.o. male who comes into the hospital today with a history of hypertension and neuropathy in his legs and feet.  The patient states that he has pain in his feet and burning at the bottoms of his feet.  The patient is also had some pain and twitching in his arms.  He reports that his left shoulder hurts due to an injury he has from back in the 80s.  The patient reports that he feels weak whenever he tries to walk.  He states that he felt like he was going to pass out when he was in [employer] earlier today.  When EMS arrived the patient's blood pressure was 177/101.  The patient takes Lopressor and he reports that he has been taking his medications.  The patient was prescribed Neurontin many months ago but reports that the prescription is at [facility] he cannot afford to pick up his prescription.  He reports that he is staying with someone but they are unable to take him where he needs to go.  He is also supposed to go to his primary care physician's office tomorrow but he states that he may miss his appointment because he does not have a ride.  He reports that he is in so much pain that he does not know what to do.  The patient also reports that he refuses to stay in a homeless shelter because they steal medications and he has nowhere else to stay so he does not want to have his medications at a homeless shelter.  The patient rates his pain a 7 out of 10 in intensity.   Past Medical History:  Diagnosis Date  . Blood clot in vein   . Diabetes mellitus   . DVT (deep venous thrombosis) ([place])   . Hypertension     Patient Active Problem List   Diagnosis Date Noted  . Neuropathic pain of ankle, left 01/25/2017  . Adjustment disorder with mixed anxiety and depressed mood 08/27/2016  . Hypertriglyceridemia 06/30/2016  . Thrombocytopenia, unspecified ([place]) 12/06/2012  . Diabetic neuropathy ([place]) 12/05/2012  . Tobacco use disorder 12/05/2012  . Dyslipidemia 12/05/2012  . Uncontrolled type 2 diabetes mellitus with ketoacidosis without coma, without long-term current use of insulin ([place]) 05/11/2011  . Essential hypertension 05/11/2011    Past Surgical History:  Procedure Laterality Date  . APPENDECTOMY    . VEIN LIGATION AND STRIPPING     of left leg    Prior to Admission medications   Medication Sig Start Date End Date Taking? Authorizing Provider  acetaminophen (TYLENOL) 325 MG tablet Take 650 mg by mouth every 6 (six) hours as needed.    [provider]  albuterol (PROVENTIL HFA;VENTOLIN HFA) 108 (90 Base) MCG/ACT inhaler Inhale 2 puffs into the lungs every 6 (six) hours as needed for wheezing or shortness of breath. 04/18/17   Johnn Hai, PA-C  aspirin EC 325 MG tablet Take 1 tablet (325 mg total) by mouth daily. 02/08/17 02/08/18  Earleen Newport, MD  Blood Glucose Monitoring Suppl (TRUE METRIX METER) w/Device KIT 1 each by Does not apply route 3 (three) times daily. 01/25/17   Angelica Chessman  E, MD  buPROPion (WELLBUTRIN) 100 MG tablet Take 1 tablet (100 mg total) by mouth 2 (two) times daily. 06/21/17   Jegede, Olugbemiga E, MD  busPIRone (BUSPAR) 10 MG tablet Take 1 tablet (10 mg total) by mouth 3 (three) times daily. 06/21/17   Jegede, Olugbemiga E, MD  citalopram (CELEXA) 10 MG tablet Take 1 tablet (10 mg total) by mouth daily. 06/21/17   Jegede, Olugbemiga E, MD  gabapentin (NEURONTIN) 100 MG capsule Take 1 capsule (100 mg total) by mouth 3 (three) times daily. 09/13/17   Jegede, Olugbemiga E, MD  gabapentin (NEURONTIN) 100 MG capsule Take 1 capsule (100 mg total) by mouth 3 (three) times daily. 09/20/17  09/20/18  Webster, Allison P, MD  gemfibrozil (LOPID) 600 MG tablet Take 1 tablet (600 mg total) by mouth 2 (two) times daily before a meal. 06/21/17   Jegede, Olugbemiga E, MD  glimepiride (AMARYL) 1 MG tablet Take 1 tablet (1 mg total) by mouth daily with breakfast. 06/21/17   Jegede, Olugbemiga E, MD  glucose blood (TRUE METRIX BLOOD GLUCOSE TEST) test strip Use as instructed 01/31/17   Jegede, Olugbemiga E, MD  insulin aspart (NOVOLOG) 100 UNIT/ML injection Inject 10units if blood sugar is greater 300; Inject 15units if blood sugar is greater 400 05/24/16   Jegede, Olugbemiga E, MD  meloxicam (MOBIC) 15 MG tablet Take 1 tablet (15 mg total) by mouth daily. 01/23/17   Cuthriell, Jonathan D, PA-C  metFORMIN (GLUCOPHAGE) 1000 MG tablet Take 1 tablet (1,000 mg total) by mouth 2 (two) times daily with a meal. 06/21/17   Jegede, Olugbemiga E, MD  metoprolol tartrate (LOPRESSOR) 50 MG tablet Take 1 tablet (50 mg total) by mouth 2 (two) times daily. 06/21/17   Jegede, Olugbemiga E, MD  Syringe, Disposable, (30-35CC SYRINGE) 35 ML MISC Use for injecting insulin 01/25/17   Jegede, Olugbemiga E, MD  TRUEPLUS LANCETS 28G MISC Check blood sugar tid 01/25/17   Jegede, Olugbemiga E, MD  Vitamin D, Ergocalciferol, (DRISDOL) 50000 UNITS CAPS capsule Take 1 capsule (50,000 Units total) by mouth every 7 (seven) days. Patient not taking: Reported on 04/12/2017 05/24/13   Abrol, Nayana, MD    Allergies Erythromycin base; Benzodiazepines; Xanax [alprazolam]; Methocarbamol; and Sulfa antibiotics  Family History  Problem Relation Age of Onset  . Heart disease Mother   . Diabetes Father   . Heart disease Father     Social History Social History   Tobacco Use  . Smoking status: Current Every Day Smoker    Packs/day: 1.00    Types: Cigarettes  . Smokeless tobacco: Never Used  Substance Use Topics  . Alcohol use: No  . Drug use: No    Review of Systems  Constitutional: No fever/chills Eyes: No visual  changes. ENT: No sore throat. Cardiovascular: Denies chest pain. Respiratory: Denies shortness of breath. Gastrointestinal: No abdominal pain.  No nausea, no vomiting.  No diarrhea.  No constipation. Genitourinary: Negative for dysuria. Musculoskeletal: Left shoulder pain  Skin: Negative for rash. Neurological: Burning to bilateral feet and numbness to bilateral feet, generalized weakness   ____________________________________________   PHYSICAL EXAM:  VITAL SIGNS: ED Triage Vitals  Enc Vitals Group     BP 09/19/17 1836 (!) 137/91     Pulse Rate 09/19/17 1836 71     Resp 09/19/17 1836 17     Temp 09/19/17 1836 98.5 F (36.9 C)     Temp Source 09/19/17 1836 Oral     SpO2 09/19/17 1836 96 %       Weight 09/19/17 1834 200 lb (90.7 kg)     Height 09/19/17 1834 5' 11" (1.803 m)     Head Circumference --      Peak Flow --      Pain Score 09/19/17 1844 7     Pain Loc --      Pain Edu? --      Excl. in GC? --     Constitutional: Alert and oriented. Well appearing and in mild distress. Eyes: Conjunctivae are normal. PERRL. EOMI. Head: Atraumatic. Nose: No congestion/rhinnorhea. Mouth/Throat: Mucous membranes are moist.  Oropharynx non-erythematous. Cardiovascular: Normal rate, regular rhythm. Grossly normal heart sounds.  Good peripheral circulation.  Pedal pulses 2+ bilaterally Respiratory: Normal respiratory effort.  No retractions. Lungs CTAB. Gastrointestinal: Soft and nontender. No distention.  Positive bowel sounds Musculoskeletal: Mild tenderness to palpation of left shoulder with some mild pain with range of motion no swelling or erythema noted Neurologic:  Normal speech and language.  Cranial nerves II through XII are grossly intact with no focal motor neuro deficit  Skin:  Skin is warm, dry and intact.  Psychiatric: Mood and affect are normal.   ____________________________________________   LABS (all labs ordered are listed, but only abnormal results are  displayed)  Labs Reviewed  CBC WITH DIFFERENTIAL/PLATELET - Abnormal; Notable for the following components:      Result Value   Hemoglobin 18.2 (*)    HCT 53.7 (*)    Platelets 130 (*)    All other components within normal limits  BASIC METABOLIC PANEL - Abnormal; Notable for the following components:   Glucose, Bld 137 (*)    All other components within normal limits   ____________________________________________  EKG  ED ECG REPORT I, Webster,  Allison P, the attending physician, personally viewed and interpreted this ECG.   Date: 09/19/2017  EKG Time: 1849  Rate: 65  Rhythm: normal sinus rhythm  Axis: normal  Intervals:none  ST&T Change: none  ____________________________________________  RADIOLOGY  ED MD interpretation: None   Official radiology report(s): No results found.  ____________________________________________   PROCEDURES  Procedure(s) performed: None  Procedures  Critical Care performed: No  ____________________________________________   INITIAL IMPRESSION / ASSESSMENT AND PLAN / ED COURSE  As part of my medical decision making, I reviewed the following data within the electronic medical record:  Notes from prior ED visits and [place] Controlled Substance Database  This is a 57-year-old male who comes into the hospital today stating that his blood pressure is elevated and he is been having some numbness in his feet which has been going on for multiple months.  The patient states though that he is in too much pain and he cannot tolerate the pain.  I discussed with the patient why he did not have his medication and he states that he cannot afford to pick it up from [facility] and he also states that because he does not have a home he does not trust having his medications at the homeless shelter.  I did explain to the patient that we have medication management and we did give the patient some information so that he could go to medication management to  obtain his medications.  The patient also has an appointment with his primary care physician he states that 2 PM.  The patient did report to me that he felt that being in the hospital could help to push through his disability so that he can  be placed in a care home but I informed him   that we are unable to affect his disability and get him placed faster.  We did give the patient some food and we did recheck his blood pressure.  The patient's initial blood pressure was 137/91 but is currently 152/104.  The patient does need to follow-up with his primary care physician who can help him with his resources and help him with placement appropriately.  The patient will be given a dose of tramadol and I will write him another prescription for medication management who can help him to obtain his medication to help with his neuropathy.  The patient did have a CBC and a BMP drawn which were unremarkable.  The patient's hemoglobin appeared stable from 2018.  The patient will be discharged home to follow back up with his primary care physician.      ____________________________________________   FINAL CLINICAL IMPRESSION(S) / ED DIAGNOSES  Final diagnoses:  Neuropathy  Weakness     ED Discharge Orders        Ordered    gabapentin (NEURONTIN) 100 MG capsule  3 times daily     09/20/17 0015       Note:  This document was prepared using Dragon voice recognition software and may include unintentional dictation errors.    Webster, Allison P, MD 09/20/17 0048  

## 2017-09-20 ENCOUNTER — Ambulatory Visit: Payer: Self-pay | Admitting: Internal Medicine

## 2017-09-20 MED ORDER — TRAMADOL HCL 50 MG PO TABS: 50.0000 mg | ORAL_TABLET | Freq: Once | ORAL | Status: DC

## 2017-09-20 MED ORDER — GABAPENTIN 100 MG PO CAPS: 100.0000 mg | ORAL_CAPSULE | Freq: Three times a day (TID) | ORAL | 0 refills | Status: DC

## 2017-09-20 NOTE — Discharge Instructions (Addendum)
Please follow-up with your primary care physician as you have scheduled tomorrow.  We have given you some resources for medication management to attempt to appropriately obtain your medications.  Please also discuss with your primary care physician your living situation and see if they might have a caseworker or social work that might be able to help you with more community resources.  Please return with any other concerns or any change in your condition.

## 2017-09-20 NOTE — ED Notes (Signed)
Went into discharge patient and patient was not in room and left resources that were given to him behind. Patient did not get medication that was ordered for him or d/c paperwork.

## 2017-09-28 ENCOUNTER — Emergency Department
Admission: EM | Admit: 2017-09-28 | Discharge: 2017-09-28 | Disposition: A | Discharge status: Home / Self Care | Payer: Self-pay | Attending: Emergency Medicine | Admitting: Emergency Medicine

## 2017-09-28 ENCOUNTER — Other Ambulatory Visit: Payer: Self-pay

## 2017-09-28 DIAGNOSIS — D696 Thrombocytopenia, unspecified: Secondary | ICD-10-CM | POA: Insufficient documentation

## 2017-09-28 DIAGNOSIS — F17210 Nicotine dependence, cigarettes, uncomplicated: Secondary | ICD-10-CM | POA: Insufficient documentation

## 2017-09-28 DIAGNOSIS — E871 Hypo-osmolality and hyponatremia: Secondary | ICD-10-CM | POA: Insufficient documentation

## 2017-09-28 DIAGNOSIS — Z7982 Long term (current) use of aspirin: Secondary | ICD-10-CM | POA: Insufficient documentation

## 2017-09-28 DIAGNOSIS — I10 Essential (primary) hypertension: Secondary | ICD-10-CM | POA: Insufficient documentation

## 2017-09-28 DIAGNOSIS — E860 Dehydration: Secondary | ICD-10-CM | POA: Insufficient documentation

## 2017-09-28 DIAGNOSIS — E119 Type 2 diabetes mellitus without complications: Secondary | ICD-10-CM | POA: Insufficient documentation

## 2017-09-28 DIAGNOSIS — R531 Weakness: Secondary | ICD-10-CM | POA: Insufficient documentation

## 2017-09-28 DIAGNOSIS — Z794 Long term (current) use of insulin: Secondary | ICD-10-CM | POA: Insufficient documentation

## 2017-09-28 DIAGNOSIS — Z79899 Other long term (current) drug therapy: Secondary | ICD-10-CM | POA: Insufficient documentation

## 2017-09-28 LAB — CBC
HCT: 51.7 % (ref 40.0–52.0)
HEMOGLOBIN: 17.4 g/dL (ref 13.0–18.0)
MCH: 31.6 pg (ref 26.0–34.0)
MCHC: 33.7 g/dL (ref 32.0–36.0)
MCV: 93.6 fL (ref 80.0–100.0)
PLATELETS: 59 10*3/uL — AB (ref 150–440)
RBC: 5.52 MIL/uL (ref 4.40–5.90)
RDW: 13.3 % (ref 11.5–14.5)
WBC: 9.4 10*3/uL (ref 3.8–10.6)

## 2017-09-28 LAB — URINALYSIS, COMPLETE (UACMP) WITH MICROSCOPIC
Bacteria, UA: NONE SEEN
Bilirubin Urine: NEGATIVE
Ketones, ur: NEGATIVE mg/dL
Leukocytes, UA: NEGATIVE
Nitrite: NEGATIVE
PROTEIN: 30 mg/dL — AB
Specific Gravity, Urine: 1.021 (ref 1.005–1.030)
Squamous Epithelial / LPF: NONE SEEN
pH: 5 (ref 5.0–8.0)

## 2017-09-28 LAB — COMPREHENSIVE METABOLIC PANEL
ALBUMIN: 4.2 g/dL (ref 3.5–5.0)
ALK PHOS: 61 U/L (ref 38–126)
ALT: 13 U/L — AB (ref 17–63)
AST: 16 U/L (ref 15–41)
Anion gap: 11 (ref 5–15)
BUN: 14 mg/dL (ref 6–20)
CALCIUM: 8.9 mg/dL (ref 8.9–10.3)
CO2: 25 mmol/L (ref 22–32)
CREATININE: 0.84 mg/dL (ref 0.61–1.24)
Chloride: 93 mmol/L — ABNORMAL LOW (ref 101–111)
GFR calc Af Amer: >60 mL/min (ref >60)
GFR calc non Af Amer: >60 mL/min (ref >60)
Glucose, Bld: 207 mg/dL — ABNORMAL HIGH (ref 65–99)
Potassium: 4.1 mmol/L (ref 3.5–5.1)
SODIUM: 129 mmol/L — AB (ref 135–145)
Total Bilirubin: 1 mg/dL (ref 0.3–1.2)
Total Protein: 7.6 g/dL (ref 6.5–8.1)

## 2017-09-28 LAB — LIPASE, BLOOD: Lipase: 28 U/L (ref 11–51)

## 2017-09-28 MED ORDER — SODIUM CHLORIDE 0.9 % IV BOLUS
1000.0000 mL | Freq: Once | INTRAVENOUS | Status: AC
  Administered 2017-09-28: 1000 mL via INTRAVENOUS

## 2017-09-28 NOTE — ED Triage Notes (Addendum)
FIRST NURSE NOTE-arrived EMS with body aches. Was walking around [employer]. EMS reports he is worried about where he will sleep.  EMS denies SI with pt.  Ambulatory around front.

## 2017-09-28 NOTE — ED Triage Notes (Signed)
Pt comes into the ED via EMS from [employer] [facility] with c/o generalized weakness and body aches with nausea for the past 4 days. Denies vomiting or diarrhea..Marland Kitchen

## 2017-09-28 NOTE — Discharge Instructions (Addendum)
Please seek medical attention for any high fevers, chest pain, shortness of breath, change in behavior, persistent vomiting, bloody stool or any other new or concerning symptoms.  

## 2017-09-28 NOTE — ED Provider Notes (Signed)
[facility] Emergency Department Provider Note  ____________________________________________   I have reviewed the triage vital signs and the nursing notes.   HISTORY  Chief Complaint Weakness   History limited by: Not Limited   HPI [name] is a 57 y.o. male who presents to the emergency department today because of concern for weakness. He states that the weakness has been present for the past four days. Has been constant and has been worsening. The patient does describe difficulty with ambulation. Also states that he has been having some fevers and muscle aches and thinks he might have the flu. Has been taking over the counter tylenol and cough medication without any significant relief of his symptoms. Does state that his diet has been poor recently.   Per medical record review patient has a history of DM, HTN, DVT.  Past Medical History:  Diagnosis Date  . Blood clot in vein   . Diabetes mellitus   . DVT (deep venous thrombosis) ([place])   . Hypertension     Patient Active Problem List   Diagnosis Date Noted  . Neuropathic pain of ankle, left 01/25/2017  . Adjustment disorder with mixed anxiety and depressed mood 08/27/2016  . Hypertriglyceridemia 06/30/2016  . Thrombocytopenia, unspecified ([place]) 12/06/2012  . Diabetic neuropathy ([place]) 12/05/2012  . Tobacco use disorder 12/05/2012  . Dyslipidemia 12/05/2012  . Uncontrolled type 2 diabetes mellitus with ketoacidosis without coma, without long-term current use of insulin ([place]) 05/11/2011  . Essential hypertension 05/11/2011    Past Surgical History:  Procedure Laterality Date  . APPENDECTOMY    . VEIN LIGATION AND STRIPPING     of left leg    Prior to Admission medications   Medication Sig Start Date End Date Taking? Authorizing Provider  acetaminophen (TYLENOL) 325 MG tablet Take 650 mg by mouth every 6 (six) hours as needed.    [provider]  albuterol (PROVENTIL HFA;VENTOLIN  HFA) 108 (90 Base) MCG/ACT inhaler Inhale 2 puffs into the lungs every 6 (six) hours as needed for wheezing or shortness of breath. 04/18/17   Johnn Hai, PA-C  aspirin EC 325 MG tablet Take 1 tablet (325 mg total) by mouth daily. 02/08/17 02/08/18  Earleen Newport, MD  Blood Glucose Monitoring Suppl (TRUE METRIX METER) w/Device KIT 1 each by Does not apply route 3 (three) times daily. 01/25/17   Tresa Garter, MD  buPROPion (WELLBUTRIN) 100 MG tablet Take 1 tablet (100 mg total) by mouth 2 (two) times daily. 06/21/17   Tresa Garter, MD  busPIRone (BUSPAR) 10 MG tablet Take 1 tablet (10 mg total) by mouth 3 (three) times daily. 06/21/17   Tresa Garter, MD  citalopram (CELEXA) 10 MG tablet Take 1 tablet (10 mg total) by mouth daily. 06/21/17   Tresa Garter, MD  gabapentin (NEURONTIN) 100 MG capsule Take 1 capsule (100 mg total) by mouth 3 (three) times daily. 09/13/17   Tresa Garter, MD  gabapentin (NEURONTIN) 100 MG capsule Take 1 capsule (100 mg total) by mouth 3 (three) times daily. 09/20/17 09/20/18  Loney Hering, MD  gemfibrozil (LOPID) 600 MG tablet Take 1 tablet (600 mg total) by mouth 2 (two) times daily before a meal. 06/21/17   Jegede, Marlena Clipper, MD  glimepiride (AMARYL) 1 MG tablet Take 1 tablet (1 mg total) by mouth daily with breakfast. 06/21/17   Tresa Garter, MD  glucose blood (TRUE METRIX BLOOD GLUCOSE TEST) test strip Use as instructed  01/31/17   Tresa Garter, MD  insulin aspart (NOVOLOG) 100 UNIT/ML injection Inject 10units if blood sugar is greater 300; Inject 15units if blood sugar is greater 400 05/24/16   Jegede, Olugbemiga E, MD  meloxicam (MOBIC) 15 MG tablet Take 1 tablet (15 mg total) by mouth daily. 01/23/17   Cuthriell, Charline Bills, PA-C  metFORMIN (GLUCOPHAGE) 1000 MG tablet Take 1 tablet (1,000 mg total) by mouth 2 (two) times daily with a meal. 06/21/17   Jegede, Olugbemiga E, MD  metoprolol tartrate  (LOPRESSOR) 50 MG tablet Take 1 tablet (50 mg total) by mouth 2 (two) times daily. 06/21/17   Tresa Garter, MD  Syringe, Disposable, (30-35CC SYRINGE) 35 ML MISC Use for injecting insulin 01/25/17   Tresa Garter, MD  TRUEPLUS LANCETS 28G MISC Check blood sugar tid 01/25/17   Tresa Garter, MD    Allergies Erythromycin base; Benzodiazepines; Xanax [alprazolam]; Methocarbamol; and Sulfa antibiotics  Family History  Problem Relation Age of Onset  . Heart disease Mother   . Diabetes Father   . Heart disease Father     Social History Social History   Tobacco Use  . Smoking status: Current Every Day Smoker    Packs/day: 1.00    Types: Cigarettes  . Smokeless tobacco: Never Used  Substance Use Topics  . Alcohol use: No  . Drug use: No    Review of Systems Constitutional: Positive for fevers. Positive for generalized weakness. Eyes: No visual changes. ENT: No sore throat. Cardiovascular: Denies chest pain. Respiratory: Denies shortness of breath. Gastrointestinal: No abdominal pain.  No nausea, no vomiting.  No diarrhea.   Genitourinary: Negative for dysuria. Musculoskeletal: Positive for muscle aches. Skin: Negative for rash. Neurological: Negative for headaches, focal weakness or numbness.  ____________________________________________   PHYSICAL EXAM:  VITAL SIGNS: ED Triage Vitals  Enc Vitals Group     BP 09/28/17 1309 (!) 157/93     Pulse Rate 09/28/17 1309 100     Resp 09/28/17 1309 20     Temp 09/28/17 1309 99.9 F (37.7 C)     Temp Source 09/28/17 1309 Oral     SpO2 09/28/17 1309 96 %     Weight 09/28/17 1309 198 lb (89.8 kg)     Height 09/28/17 1309 '5\' 11"'  (1.803 m)     Head Circumference --      Peak Flow --      Pain Score 09/28/17 1454 10   Constitutional: Alert and oriented. Well appearing and in no distress. Eyes: Conjunctivae are normal.  ENT   Head: Normocephalic and atraumatic.   Nose: No congestion/rhinnorhea.    Mouth/Throat: Mucous membranes are moist.   Neck: No stridor. Hematological/Lymphatic/Immunilogical: No cervical lymphadenopathy. Cardiovascular: Normal rate, regular rhythm.  No murmurs, rubs, or gallops.  Respiratory: Normal respiratory effort without tachypnea nor retractions. Breath sounds are clear and equal bilaterally. No wheezes/rales/rhonchi. Gastrointestinal: Soft and non tender. No rebound. No guarding.  Genitourinary: Deferred Musculoskeletal: Normal range of motion in all extremities. No lower extremity edema. Neurologic:  Normal speech and language. No gross focal neurologic deficits are appreciated.  Skin:  Skin is warm, dry and intact. No rash noted. Psychiatric: Mood and affect are normal. Speech and behavior are normal. Patient exhibits appropriate insight and judgment.  ____________________________________________    LABS (pertinent positives/negatives)  CMP na 129, chloride 93, glu 207, cr 0.84 Lipase 28 CBC wbc 9.4, hgb 17.4, plt 59 UA not consistent with infection  ____________________________________________   EKG  None  ____________________________________________    RADIOLOGY  None  ____________________________________________   PROCEDURES  Procedures  ____________________________________________   INITIAL IMPRESSION / ASSESSMENT AND PLAN / ED COURSE  Pertinent labs & imaging results that were available during my care of the patient were reviewed by me and considered in my medical decision making (see chart for details).  Patient presented with primary complaint of generalized weakness. Differential would be broad including anemia, dehydration, infection, amongst other etiologies. Blood work showed low sodium (corrected to 131, 132 given hyperglycemia) and low chloride concerning for dehydration. Patient did state he has not been eating well recently. Was given IV fluids and felt better. In addition the patient was found to be  thrombocytopenic. Per chart review it does appear patient has had thrombocytopenia in the past. At this point wonder if secondary to poor nutrition vs recent viral illness given complaints of cough and fever. Did discuss this result with the patient and emphasized the importance that the patient get these blood tests repeated in a couple of days. Did encourage patient to go to homeless shelter if he had concerns about his current sleeping arrangements.    ____________________________________________   FINAL CLINICAL IMPRESSION(S) / ED DIAGNOSES  Final diagnoses:  Weakness  Dehydration  Hyponatremia  Thrombocytopenia ([place])     Note: This dictation was prepared with Dragon dictation. Any transcriptional errors that result from this process are unintentional     Nance Pear, MD 09/28/17 1734

## 2017-11-08 ENCOUNTER — Encounter: Payer: Self-pay | Admitting: Emergency Medicine

## 2017-11-08 ENCOUNTER — Emergency Department: Payer: Self-pay

## 2017-11-08 ENCOUNTER — Emergency Department
Admission: EM | Admit: 2017-11-08 | Discharge: 2017-11-08 | Disposition: A | Discharge status: Home / Self Care | Payer: Self-pay | Attending: Emergency Medicine | Admitting: Emergency Medicine

## 2017-11-08 DIAGNOSIS — M5412 Radiculopathy, cervical region: Secondary | ICD-10-CM | POA: Insufficient documentation

## 2017-11-08 DIAGNOSIS — F17210 Nicotine dependence, cigarettes, uncomplicated: Secondary | ICD-10-CM | POA: Insufficient documentation

## 2017-11-08 DIAGNOSIS — Z7982 Long term (current) use of aspirin: Secondary | ICD-10-CM | POA: Insufficient documentation

## 2017-11-08 DIAGNOSIS — Z7984 Long term (current) use of oral hypoglycemic drugs: Secondary | ICD-10-CM | POA: Insufficient documentation

## 2017-11-08 DIAGNOSIS — E119 Type 2 diabetes mellitus without complications: Secondary | ICD-10-CM | POA: Insufficient documentation

## 2017-11-08 DIAGNOSIS — Z79899 Other long term (current) drug therapy: Secondary | ICD-10-CM | POA: Insufficient documentation

## 2017-11-08 DIAGNOSIS — I10 Essential (primary) hypertension: Secondary | ICD-10-CM | POA: Insufficient documentation

## 2017-11-08 MED ORDER — MELOXICAM 15 MG PO TABS: 15.0000 mg | ORAL_TABLET | Freq: Every day | ORAL | 0 refills | Status: DC

## 2017-11-08 MED ORDER — KETOROLAC TROMETHAMINE 30 MG/ML IJ SOLN
30.0000 mg | Freq: Once | INTRAMUSCULAR | Status: AC
  Administered 2017-11-08: 30 mg via INTRAMUSCULAR
  Filled 2017-11-08: qty 1

## 2017-11-08 MED ORDER — ORPHENADRINE CITRATE 30 MG/ML IJ SOLN
60.0000 mg | Freq: Once | INTRAMUSCULAR | Status: AC
  Administered 2017-11-08: 60 mg via INTRAMUSCULAR
  Filled 2017-11-08: qty 2

## 2017-11-08 MED ORDER — CYCLOBENZAPRINE HCL 10 MG PO TABS: 10.0000 mg | ORAL_TABLET | Freq: Three times a day (TID) | ORAL | 0 refills | Status: DC | PRN

## 2017-11-08 NOTE — ED Triage Notes (Signed)
Pt comes into the ED via POV c/o left shoulder and neck pain.  Patient states he has an old injury that already caused shoulder pain but he also fell two days ago and is now having pain.  Patient in NAD at this time with even and unlabored respirations.

## 2017-11-08 NOTE — ED Provider Notes (Signed)
[facility] Emergency Department Provider Note  ____________________________________________  Time seen: Approximately 5:08 PM  I have reviewed the triage vital signs and the nursing notes.   HISTORY  Chief Complaint Arm Pain and Neck Pain    HPI [name] is a 57 y.o. male who presents the emergency department complaining of left shoulder pain with radicular symptoms down the left arm.  Patient reports that he had a previous injury approximately 25 years ago to the shoulder with a dislocation.  Patient reports that he has had a "problems every so often since then."  Patient reports that he has been doing some work over the past couple of days using his upper extremities and has increasing pain to his left shoulder.  Patient reports that he has radicular symptoms originating in the posterior shoulder running down to the left hand.  Patient denies any headache, neck pain, chest pain, shortness of breath, abdominal pain, nausea or vomiting.  Patient takes gabapentin for neuropathy from his diabetes.  Patient states that he is also been taking Motrin for the pain.  No other medications for this complaint.  Patient has a history of DVT, diabetes, hypertension.  No complaints with chronic medical problems.  Past Medical History:  Diagnosis Date  . Blood clot in vein   . Diabetes mellitus   . DVT (deep venous thrombosis) ([place])   . Hypertension     Patient Active Problem List   Diagnosis Date Noted  . Neuropathic pain of ankle, left 01/25/2017  . Adjustment disorder with mixed anxiety and depressed mood 08/27/2016  . Hypertriglyceridemia 06/30/2016  . Thrombocytopenia, unspecified ([place]) 12/06/2012  . Diabetic neuropathy ([place]) 12/05/2012  . Tobacco use disorder 12/05/2012  . Dyslipidemia 12/05/2012  . Uncontrolled type 2 diabetes mellitus with ketoacidosis without coma, without long-term current use of insulin ([place]) 05/11/2011  . Essential hypertension  05/11/2011    Past Surgical History:  Procedure Laterality Date  . APPENDECTOMY    . VEIN LIGATION AND STRIPPING     of left leg    Prior to Admission medications   Medication Sig Start Date End Date Taking? Authorizing Provider  acetaminophen (TYLENOL) 325 MG tablet Take 650 mg by mouth every 6 (six) hours as needed.    [provider]  albuterol (PROVENTIL HFA;VENTOLIN HFA) 108 (90 Base) MCG/ACT inhaler Inhale 2 puffs into the lungs every 6 (six) hours as needed for wheezing or shortness of breath. 04/18/17   Johnn Hai, PA-C  aspirin EC 325 MG tablet Take 1 tablet (325 mg total) by mouth daily. 02/08/17 02/08/18  Earleen Newport, MD  Blood Glucose Monitoring Suppl (TRUE METRIX METER) w/Device KIT 1 each by Does not apply route 3 (three) times daily. 01/25/17   Tresa Garter, MD  buPROPion (WELLBUTRIN) 100 MG tablet Take 1 tablet (100 mg total) by mouth 2 (two) times daily. 06/21/17   Tresa Garter, MD  busPIRone (BUSPAR) 10 MG tablet Take 1 tablet (10 mg total) by mouth 3 (three) times daily. 06/21/17   Tresa Garter, MD  citalopram (CELEXA) 10 MG tablet Take 1 tablet (10 mg total) by mouth daily. 06/21/17   Tresa Garter, MD  cyclobenzaprine (FLEXERIL) 10 MG tablet Take 1 tablet (10 mg total) by mouth 3 (three) times daily as needed for muscle spasms. 11/08/17   Cuthriell, Charline Bills, PA-C  gabapentin (NEURONTIN) 100 MG capsule Take 1 capsule (100 mg total) by mouth 3 (three) times daily. 09/13/17  Tresa Garter, MD  gabapentin (NEURONTIN) 100 MG capsule Take 1 capsule (100 mg total) by mouth 3 (three) times daily. 09/20/17 09/20/18  Loney Hering, MD  gemfibrozil (LOPID) 600 MG tablet Take 1 tablet (600 mg total) by mouth 2 (two) times daily before a meal. 06/21/17   Jegede, Marlena Clipper, MD  glimepiride (AMARYL) 1 MG tablet Take 1 tablet (1 mg total) by mouth daily with breakfast. 06/21/17   Angelica Chessman E, MD  glucose blood  (TRUE METRIX BLOOD GLUCOSE TEST) test strip Use as instructed 01/31/17   Angelica Chessman E, MD  insulin aspart (NOVOLOG) 100 UNIT/ML injection Inject 10units if blood sugar is greater 300; Inject 15units if blood sugar is greater 400 05/24/16   Jegede, Olugbemiga E, MD  meloxicam (MOBIC) 15 MG tablet Take 1 tablet (15 mg total) by mouth daily. 11/08/17   Cuthriell, Charline Bills, PA-C  metFORMIN (GLUCOPHAGE) 1000 MG tablet Take 1 tablet (1,000 mg total) by mouth 2 (two) times daily with a meal. 06/21/17   Jegede, Olugbemiga E, MD  metoprolol tartrate (LOPRESSOR) 50 MG tablet Take 1 tablet (50 mg total) by mouth 2 (two) times daily. 06/21/17   Tresa Garter, MD  Syringe, Disposable, (30-35CC SYRINGE) 35 ML MISC Use for injecting insulin 01/25/17   Tresa Garter, MD  TRUEPLUS LANCETS 28G MISC Check blood sugar tid 01/25/17   Tresa Garter, MD    Allergies Erythromycin base; Benzodiazepines; Xanax [alprazolam]; Methocarbamol; and Sulfa antibiotics  Family History  Problem Relation Age of Onset  . Heart disease Mother   . Diabetes Father   . Heart disease Father     Social History Social History   Tobacco Use  . Smoking status: Current Every Day Smoker    Packs/day: 1.00    Types: Cigarettes  . Smokeless tobacco: Never Used  Substance Use Topics  . Alcohol use: No  . Drug use: No     Review of Systems  Constitutional: No fever/chills Eyes: No visual changes. Cardiovascular: no chest pain. Respiratory: no cough. No SOB. Gastrointestinal: No abdominal pain.  No nausea, no vomiting.  Musculoskeletal: Positive for left shoulder pain with radicular symptoms down the left arm Skin: Negative for rash, abrasions, lacerations, ecchymosis. Neurological: Negative for headaches, focal weakness or numbness. 10-point ROS otherwise negative.  ____________________________________________   PHYSICAL EXAM:  VITAL SIGNS: ED Triage Vitals  Enc Vitals Group     BP 11/08/17  1701 (!) 157/97     Pulse Rate 11/08/17 1701 68     Resp 11/08/17 1701 18     Temp 11/08/17 1701 (!) 97.4 F (36.3 C)     Temp Source 11/08/17 1701 Oral     SpO2 11/08/17 1701 97 %     Weight 11/08/17 1654 195 lb (88.5 kg)     Height 11/08/17 1654 '5\' 11"'  (1.803 m)     Head Circumference --      Peak Flow --      Pain Score 11/08/17 1654 8     Pain Loc --      Pain Edu? --      Excl. in [place]? --      Constitutional: Alert and oriented. Well appearing and in no acute distress. Eyes: Conjunctivae are normal. PERRL. EOMI. Head: Atraumatic. Neck: No stridor.  No cervical spine tenderness to palpation.  Patient is mildly tender palpation left paraspinal muscle region.  No palpable abnormality.  Cardiovascular: Normal rate, regular rhythm. Normal S1 and S2.  Good peripheral circulation. Respiratory: Normal respiratory effort without tachypnea or retractions. Lungs CTAB. Good air entry to the bases with no decreased or absent breath sounds. Musculoskeletal: Full range of motion to all extremities. No gross deformities appreciated.  No deformity, edema, ecchymosis noted to the left shoulder.  Patient is able to extend, flex, abduct and adduct the left shoulder.  Patient reports diffuse tenderness to palpation without any point specific tenderness.  Examination of the elbow is unremarkable.  Radial pulse intact distally.  Sensation intact in all dermatomal distributions distally. Neurologic:  Normal speech and language. No gross focal neurologic deficits are appreciated.  Skin:  Skin is warm, dry and intact. No rash noted. Psychiatric: Mood and affect are normal. Speech and behavior are normal. Patient exhibits appropriate insight and judgement.   ____________________________________________   LABS (all labs ordered are listed, but only abnormal results are displayed)  Labs Reviewed - No data to  display ____________________________________________  EKG   ____________________________________________  RADIOLOGY Diamantina Providence Cuthriell, personally viewed and evaluated these images (plain radiographs) as part of my medical decision making, as well as reviewing the written report by the radiologist.  I concur with radiologist finding of no acute osseous abnormality to the left shoulder.  Dg Shoulder Left  Result Date: 11/08/2017 CLINICAL DATA:  Left shoulder pain. EXAM: LEFT SHOULDER - 2+ VIEW COMPARISON:  None. FINDINGS: There is no evidence of fracture or dislocation. There is no evidence of arthropathy or other focal bone abnormality. Soft tissues are unremarkable. IMPRESSION: Negative. Electronically Signed   By: Aletta Edouard M.D.   On: 11/08/2017 18:06    ____________________________________________    PROCEDURES  Procedure(s) performed:    Procedures    Medications  ketorolac (TORADOL) 30 MG/ML injection 30 mg (has no administration in time range)  orphenadrine (NORFLEX) injection 60 mg (has no administration in time range)     ____________________________________________   INITIAL IMPRESSION / ASSESSMENT AND PLAN / ED COURSE  Pertinent labs & imaging results that were available during my care of the patient were reviewed by me and considered in my medical decision making (see chart for details).  Review of the [place] CSRS was performed in accordance of the [place] prior to dispensing any controlled drugs.     Patient's diagnosis is consistent with cervical radiculopathy.  Patient presents with burning sensation radiating from the neck into the left upper extremity.  Patient's most significant complaint was in the shoulder.  X-ray reveals no acute osseous abnormality.  Exam is reassuring with no indication for further work-up.  Patient is given Toradol and Norflex injections in the emergency department.. Patient will be discharged home with prescriptions for  meloxicam and Flexeril. Patient is to follow up with primary care as needed or otherwise directed. Patient is given ED precautions to return to the ED for any worsening or new symptoms.     ____________________________________________  FINAL CLINICAL IMPRESSION(S) / ED DIAGNOSES  Final diagnoses:  Cervical radiculopathy      NEW MEDICATIONS STARTED DURING THIS VISIT:  ED Discharge Orders        Ordered    meloxicam (MOBIC) 15 MG tablet  Daily     11/08/17 1819    cyclobenzaprine (FLEXERIL) 10 MG tablet  3 times daily PRN     11/08/17 1819          This chart was dictated using voice recognition software/Dragon. Despite best efforts to proofread, errors can occur which can change the meaning. Any change was  purely unintentional.    Darletta Moll, PA-C 11/08/17 1819    Arta Silence, MD 11/08/17 2340

## 2017-12-21 ENCOUNTER — Ambulatory Visit: Payer: Self-pay | Admitting: Internal Medicine

## 2018-06-09 ENCOUNTER — Other Ambulatory Visit: Payer: Self-pay | Admitting: Internal Medicine

## 2018-06-09 DIAGNOSIS — E119 Type 2 diabetes mellitus without complications: Secondary | ICD-10-CM

## 2018-06-15 ENCOUNTER — Other Ambulatory Visit: Payer: Self-pay | Admitting: Internal Medicine

## 2018-06-15 ENCOUNTER — Other Ambulatory Visit: Payer: Self-pay

## 2018-06-15 ENCOUNTER — Emergency Department
Admission: EM | Admit: 2018-06-15 | Discharge: 2018-06-15 | Disposition: A | Discharge status: Home / Self Care | Payer: Self-pay | Attending: Emergency Medicine | Admitting: Emergency Medicine

## 2018-06-15 ENCOUNTER — Encounter: Payer: Self-pay | Admitting: Emergency Medicine

## 2018-06-15 DIAGNOSIS — Z760 Encounter for issue of repeat prescription: Secondary | ICD-10-CM | POA: Insufficient documentation

## 2018-06-15 DIAGNOSIS — E1140 Type 2 diabetes mellitus with diabetic neuropathy, unspecified: Secondary | ICD-10-CM

## 2018-06-15 DIAGNOSIS — I10 Essential (primary) hypertension: Secondary | ICD-10-CM

## 2018-06-15 DIAGNOSIS — E1165 Type 2 diabetes mellitus with hyperglycemia: Secondary | ICD-10-CM

## 2018-06-15 MED ORDER — METFORMIN HCL 500 MG PO TABS
1000.0000 mg | ORAL_TABLET | Freq: Once | ORAL | Status: AC
  Administered 2018-06-15: 1000 mg via ORAL
  Filled 2018-06-15: qty 2

## 2018-06-15 MED ORDER — GLIMEPIRIDE 1 MG PO TABS
1.0000 mg | ORAL_TABLET | Freq: Every day | ORAL | Status: DC
  Administered 2018-06-15: 1 mg via ORAL
  Filled 2018-06-15: qty 1

## 2018-06-15 MED ORDER — METOPROLOL TARTRATE 50 MG PO TABS
50.0000 mg | ORAL_TABLET | Freq: Once | ORAL | Status: AC
  Administered 2018-06-15: 50 mg via ORAL
  Filled 2018-06-15: qty 1

## 2018-06-15 NOTE — ED Triage Notes (Signed)
Pt reports losing 3 medication bottles yesterday, pt states he has been looking all through the yard this morning.  PT states he last took doses last night.    Pt states he is missing:  1. Metoprolol tartrate 50mg  PO BID 2. Metformin 1000mg  PO BID 3. Glimiperide 1mg  PO daily  [facility] [address]

## 2018-06-15 NOTE — ED Provider Notes (Signed)
[facility] Emergency Department Provider Note   ____________________________________________   First MD Initiated Contact with Patient 06/15/18 1100     (approximate)  I have reviewed the triage vital signs and the nursing notes.   HISTORY  Chief Complaint Medication Refill    HPI [name] is a 57 y.o. male patient here today for emergency refill of his diabetic and hypertensive medications.  Patient had contacted his doctor 3 days ago but was told yesterday that they were not available at the pharmacy.  Past Medical History:  Diagnosis Date  . Blood clot in vein   . Diabetes mellitus   . DVT (deep venous thrombosis) ([place])   . Hypertension     Patient Active Problem List   Diagnosis Date Noted  . Neuropathic pain of ankle, left 01/25/2017  . Adjustment disorder with mixed anxiety and depressed mood 08/27/2016  . Hypertriglyceridemia 06/30/2016  . Thrombocytopenia, unspecified ([place]) 12/06/2012  . Diabetic neuropathy ([place]) 12/05/2012  . Tobacco use disorder 12/05/2012  . Dyslipidemia 12/05/2012  . Uncontrolled type 2 diabetes mellitus with ketoacidosis without coma, without long-term current use of insulin ([place]) 05/11/2011  . Essential hypertension 05/11/2011    Past Surgical History:  Procedure Laterality Date  . APPENDECTOMY    . VEIN LIGATION AND STRIPPING     of left leg    Prior to Admission medications   Medication Sig Start Date End Date Taking? Authorizing Provider  acetaminophen (TYLENOL) 325 MG tablet Take 650 mg by mouth every 6 (six) hours as needed.    [provider]  albuterol (PROVENTIL HFA;VENTOLIN HFA) 108 (90 Base) MCG/ACT inhaler Inhale 2 puffs into the lungs every 6 (six) hours as needed for wheezing or shortness of breath. 04/18/17   Johnn Hai, PA-C  Blood Glucose Monitoring Suppl (TRUE METRIX METER) w/Device KIT 1 each by Does not apply route 3 (three) times daily. 01/25/17   Tresa Garter, MD  buPROPion (WELLBUTRIN) 100 MG tablet Take 1 tablet (100 mg total) by mouth 2 (two) times daily. 06/21/17   Tresa Garter, MD  busPIRone (BUSPAR) 10 MG tablet Take 1 tablet (10 mg total) by mouth 3 (three) times daily. 06/21/17   Tresa Garter, MD  citalopram (CELEXA) 10 MG tablet Take 1 tablet (10 mg total) by mouth daily. 06/21/17   Tresa Garter, MD  cyclobenzaprine (FLEXERIL) 10 MG tablet Take 1 tablet (10 mg total) by mouth 3 (three) times daily as needed for muscle spasms. 11/08/17   Cuthriell, Charline Bills, PA-C  gabapentin (NEURONTIN) 100 MG capsule Take 1 capsule (100 mg total) by mouth 3 (three) times daily. 09/13/17   Tresa Garter, MD  gabapentin (NEURONTIN) 100 MG capsule Take 1 capsule (100 mg total) by mouth 3 (three) times daily. 09/20/17 09/20/18  Loney Hering, MD  gemfibrozil (LOPID) 600 MG tablet Take 1 tablet (600 mg total) by mouth 2 (two) times daily before a meal. 06/21/17   Jegede, Marlena Clipper, MD  glimepiride (AMARYL) 1 MG tablet Take 1 tablet (1 mg total) by mouth daily with breakfast. 06/21/17   Angelica Chessman E, MD  glucose blood (TRUE METRIX BLOOD GLUCOSE TEST) test strip Use as instructed 01/31/17   Angelica Chessman E, MD  insulin aspart (NOVOLOG) 100 UNIT/ML injection Inject 10units if blood sugar is greater 300; Inject 15units if blood sugar is greater 400 05/24/16   Jegede, Olugbemiga E, MD  meloxicam (MOBIC) 15 MG tablet Take 1 tablet (  15 mg total) by mouth daily. 11/08/17   Cuthriell, Charline Bills, PA-C  metFORMIN (GLUCOPHAGE) 1000 MG tablet Take 1 tablet (1,000 mg total) by mouth 2 (two) times daily with a meal. 06/21/17   Jegede, Olugbemiga E, MD  metoprolol tartrate (LOPRESSOR) 50 MG tablet Take 1 tablet (50 mg total) by mouth 2 (two) times daily. 06/21/17   Tresa Garter, MD  Syringe, Disposable, (30-35CC SYRINGE) 35 ML MISC Use for injecting insulin 01/25/17   Tresa Garter, MD  TRUEPLUS LANCETS 28G MISC Check  blood sugar tid 01/25/17   Tresa Garter, MD    Allergies Erythromycin base; Benzodiazepines; Xanax [alprazolam]; Methocarbamol; and Sulfa antibiotics  Family History  Problem Relation Age of Onset  . Heart disease Mother   . Diabetes Father   . Heart disease Father     Social History Social History   Tobacco Use  . Smoking status: Current Every Day Smoker    Packs/day: 1.00    Types: Cigarettes  . Smokeless tobacco: Never Used  Substance Use Topics  . Alcohol use: No  . Drug use: No    Review of Systems  Constitutional: No fever/chills Eyes: No visual changes. ENT: No sore throat. Cardiovascular: Denies chest pain. Respiratory: Denies shortness of breath. Gastrointestinal: No abdominal pain.  No nausea, no vomiting.  No diarrhea.  No constipation. Genitourinary: Negative for dysuria. Musculoskeletal: Negative for back pain. Skin: Negative for rash. Neurological: Negative for headaches, focal weakness or numbness. Psychiatric:Anxiety and depression. Endocrine:Diabetes, hyperlipidemia, and hypertension. Allergic/Immunilogical: See medication list ____________________________________________   PHYSICAL EXAM:  VITAL SIGNS: ED Triage Vitals  Enc Vitals Group     BP 06/15/18 1029 (!) 155/91     Pulse Rate 06/15/18 1029 70     Resp --      Temp 06/15/18 1029 (!) 97.5 F (36.4 C)     Temp Source 06/15/18 1029 Oral     SpO2 06/15/18 1029 100 %     Weight 06/15/18 1030 200 lb (90.7 kg)     Height 06/15/18 1030 '5\' 11"'  (1.803 m)     Head Circumference --      Peak Flow --      Pain Score 06/15/18 1034 0     Pain Loc --      Pain Edu? --      Excl. in [place]? --    Constitutional: Alert and oriented. Well appearing and in no acute distress. Cardiovascular: Normal rate, regular rhythm. Grossly normal heart sounds.  Good peripheral circulation.  Evaded blood pressure. Respiratory: Normal respiratory effort.  No retractions. Lungs CTAB. Neurologic:  Normal  speech and language. No gross focal neurologic deficits are appreciated. No gait instability. Skin:  Skin is warm, dry and intact. No rash noted. Psychiatric: Mood and affect are normal. Speech and behavior are normal.  ____________________________________________   LABS (all labs ordered are listed, but only abnormal results are displayed)  Labs Reviewed - No data to display ____________________________________________  EKG   ____________________________________________  RADIOLOGY  ED MD interpretation:    Official radiology report(s): No results found.  ____________________________________________   PROCEDURES  Procedure(s) performed: None  Procedures  Critical Care performed: No  ____________________________________________   INITIAL IMPRESSION / ASSESSMENT AND PLAN / ED COURSE  As part of my medical decision making, I reviewed the following data within the [place]    Patient presents emergency refill of his diabetic and hypertension medication.  Advised patient records show the reorder has been  issued by his treating doctor.  Advised to check with pharmacy if medications that there have the pharmacist call and I will authorize emergency room on refill.      ____________________________________________   FINAL CLINICAL IMPRESSION(S) / ED DIAGNOSES  Final diagnoses:  Encounter for medication refill     ED Discharge Orders    None       Note:  This document was prepared using Dragon voice recognition software and may include unintentional dictation errors.    Sable Feil, PA-C 06/15/18 1140    Schuyler Amor, MD 06/17/18 [phone]

## 2018-06-15 NOTE — Discharge Instructions (Addendum)
Review your prescription show that you have reorders pending.  If these medications are not available when you go to the pharmacy called this department and our authorized 1 month emergency supply  while you check with your treating doctor. Metformin 1000 mg twice daily,  Lopressor 50 mg twice a day, and Glimepride 1mg  daily.

## 2018-06-16 NOTE — ED Notes (Signed)
[facility] on [address] called stating pt was there to pick up refills on his Metformin 1,000 mg BID, Lopressor 50 mg BID, and his Glimepride 1 mg QD. Per [facility] they do not have these refills. Spoke with Enid DerryAshley Wagner, PA-C, who reviewed pts charted and gave authorization to call in 1 month refill on each of these medications.  [facility] and called in 1 month refill on medication.

## 2018-07-12 ENCOUNTER — Telehealth: Payer: Self-pay | Admitting: Family Medicine

## 2018-07-16 ENCOUNTER — Ambulatory Visit: Payer: Self-pay | Attending: Family Medicine | Admitting: Family Medicine

## 2018-07-16 ENCOUNTER — Encounter: Payer: Self-pay | Admitting: Family Medicine

## 2018-07-16 VITALS — BP 166/97 | HR 87 | Temp 98.0°F | Wt 200.8 lb

## 2018-07-16 DIAGNOSIS — F17210 Nicotine dependence, cigarettes, uncomplicated: Secondary | ICD-10-CM

## 2018-07-16 DIAGNOSIS — Z79899 Other long term (current) drug therapy: Secondary | ICD-10-CM | POA: Insufficient documentation

## 2018-07-16 DIAGNOSIS — F4323 Adjustment disorder with mixed anxiety and depressed mood: Secondary | ICD-10-CM

## 2018-07-16 DIAGNOSIS — Z881 Allergy status to other antibiotic agents status: Secondary | ICD-10-CM | POA: Insufficient documentation

## 2018-07-16 DIAGNOSIS — E1110 Type 2 diabetes mellitus with ketoacidosis without coma: Secondary | ICD-10-CM

## 2018-07-16 DIAGNOSIS — Z794 Long term (current) use of insulin: Secondary | ICD-10-CM | POA: Insufficient documentation

## 2018-07-16 DIAGNOSIS — Z791 Long term (current) use of non-steroidal anti-inflammatories (NSAID): Secondary | ICD-10-CM | POA: Insufficient documentation

## 2018-07-16 DIAGNOSIS — M25579 Pain in unspecified ankle and joints of unspecified foot: Secondary | ICD-10-CM | POA: Insufficient documentation

## 2018-07-16 DIAGNOSIS — Z888 Allergy status to other drugs, medicaments and biological substances status: Secondary | ICD-10-CM | POA: Insufficient documentation

## 2018-07-16 DIAGNOSIS — E1140 Type 2 diabetes mellitus with diabetic neuropathy, unspecified: Secondary | ICD-10-CM | POA: Insufficient documentation

## 2018-07-16 DIAGNOSIS — Z760 Encounter for issue of repeat prescription: Secondary | ICD-10-CM | POA: Insufficient documentation

## 2018-07-16 DIAGNOSIS — Z720 Tobacco use: Secondary | ICD-10-CM | POA: Insufficient documentation

## 2018-07-16 DIAGNOSIS — Z86718 Personal history of other venous thrombosis and embolism: Secondary | ICD-10-CM | POA: Insufficient documentation

## 2018-07-16 DIAGNOSIS — M792 Neuralgia and neuritis, unspecified: Secondary | ICD-10-CM

## 2018-07-16 DIAGNOSIS — F17200 Nicotine dependence, unspecified, uncomplicated: Secondary | ICD-10-CM

## 2018-07-16 DIAGNOSIS — Z1159 Encounter for screening for other viral diseases: Secondary | ICD-10-CM

## 2018-07-16 DIAGNOSIS — Z882 Allergy status to sulfonamides status: Secondary | ICD-10-CM | POA: Insufficient documentation

## 2018-07-16 DIAGNOSIS — I10 Essential (primary) hypertension: Secondary | ICD-10-CM

## 2018-07-16 LAB — GLUCOSE, POCT (MANUAL RESULT ENTRY): POC GLUCOSE: 105 mg/dL — AB (ref 70–99)

## 2018-07-16 LAB — POCT GLYCOSYLATED HEMOGLOBIN (HGB A1C): HEMOGLOBIN A1C: 8.1 % — AB (ref 4.0–5.6)

## 2018-07-16 MED ORDER — GABAPENTIN 100 MG PO CAPS: 100.0000 mg | ORAL_CAPSULE | Freq: Three times a day (TID) | ORAL | 1 refills | Status: DC

## 2018-07-16 MED ORDER — METOPROLOL TARTRATE 50 MG PO TABS: 50.0000 mg | ORAL_TABLET | Freq: Two times a day (BID) | ORAL | 1 refills | Status: DC

## 2018-07-16 MED ORDER — METFORMIN HCL 1000 MG PO TABS: 1000.0000 mg | ORAL_TABLET | Freq: Two times a day (BID) | ORAL | 1 refills | Status: DC

## 2018-07-16 MED ORDER — BUSPIRONE HCL 10 MG PO TABS: 10.0000 mg | ORAL_TABLET | Freq: Three times a day (TID) | ORAL | 1 refills | Status: DC

## 2018-07-16 MED ORDER — GLIMEPIRIDE 4 MG PO TABS: 4.0000 mg | ORAL_TABLET | Freq: Every day | ORAL | 1 refills | Status: DC

## 2018-07-16 MED FILL — metFORMIN HCL 1000 MG TABS: 1000 | 30 days supply | Qty: 60 | Fill #0

## 2018-07-16 MED FILL — GLIMEPIRIDE 4 MG TABS: 4 | 30 days supply | Qty: 30 | Fill #0

## 2018-07-16 MED FILL — busPIRone HCL 10 MG TABS: 10 | 30 days supply | Qty: 90 | Fill #0

## 2018-07-16 MED FILL — METOPROLOL TARTRATE 50 MG T: 50 | 30 days supply | Qty: 60 | Fill #0

## 2018-07-16 MED FILL — GABAPENTIN 100 MG CAPSULE: 100 | 30 days supply | Qty: 90 | Fill #0

## 2018-07-16 NOTE — Patient Instructions (Signed)
Diabetes Mellitus and Nutrition, Adult  When you have diabetes (diabetes mellitus), it is very important to have healthy eating habits because your blood sugar (glucose) levels are greatly affected by what you eat and drink. Eating healthy foods in the appropriate amounts, at about the same times every day, can help you:  · Control your blood glucose.  · Lower your risk of heart disease.  · Improve your blood pressure.  · Reach or maintain a healthy weight.  Every person with diabetes is different, and each person has different needs for a meal plan. Your health care provider may recommend that you work with a diet and nutrition specialist (dietitian) to make a meal plan that is best for you. Your meal plan may vary depending on factors such as:  · The calories you need.  · The medicines you take.  · Your weight.  · Your blood glucose, blood pressure, and cholesterol levels.  · Your activity level.  · Other health conditions you have, such as heart or kidney disease.  How do carbohydrates affect me?  Carbohydrates, also called carbs, affect your blood glucose level more than any other type of food. Eating carbs naturally raises the amount of glucose in your blood. Carb counting is a method for keeping track of how many carbs you eat. Counting carbs is important to keep your blood glucose at a healthy level, especially if you use insulin or take certain oral diabetes medicines.  It is important to know how many carbs you can safely have in each meal. This is different for every person. Your dietitian can help you calculate how many carbs you should have at each meal and for each snack.  Foods that contain carbs include:  · Bread, cereal, rice, pasta, and crackers.  · Potatoes and corn.  · Peas, beans, and lentils.  · Milk and yogurt.  · Fruit and juice.  · Desserts, such as cakes, cookies, ice cream, and candy.  How does alcohol affect me?  Alcohol can cause a sudden decrease in blood glucose (hypoglycemia),  especially if you use insulin or take certain oral diabetes medicines. Hypoglycemia can be a life-threatening condition. Symptoms of hypoglycemia (sleepiness, dizziness, and confusion) are similar to symptoms of having too much alcohol.  If your health care provider says that alcohol is safe for you, follow these guidelines:  · Limit alcohol intake to no more than 1 drink per day for nonpregnant women and 2 drinks per day for men. One drink equals 12 oz of beer, 5 oz of wine, or 1½ oz of hard liquor.  · Do not drink on an empty stomach.  · Keep yourself hydrated with water, diet soda, or unsweetened iced tea.  · Keep in mind that regular soda, juice, and other mixers may contain a lot of sugar and must be counted as carbs.  What are tips for following this plan?    Reading food labels  · Start by checking the serving size on the "Nutrition Facts" label of packaged foods and drinks. The amount of calories, carbs, fats, and other nutrients listed on the label is based on one serving of the item. Many items contain more than one serving per package.  · Check the total grams (g) of carbs in one serving. You can calculate the number of servings of carbs in one serving by dividing the total carbs by 15. For example, if a food has 30 g of total carbs, it would be equal to 2   servings of carbs.  · Check the number of grams (g) of saturated and trans fats in one serving. Choose foods that have low or no amount of these fats.  · Check the number of milligrams (mg) of salt (sodium) in one serving. Most people should limit total sodium intake to less than 2,300 mg per day.  · Always check the nutrition information of foods labeled as "low-fat" or "nonfat". These foods may be higher in added sugar or refined carbs and should be avoided.  · Talk to your dietitian to identify your daily goals for nutrients listed on the label.  Shopping  · Avoid buying canned, premade, or processed foods. These foods tend to be high in fat, sodium,  and added sugar.  · Shop around the outside edge of the grocery store. This includes fresh fruits and vegetables, bulk grains, fresh meats, and fresh dairy.  Cooking  · Use low-heat cooking methods, such as baking, instead of high-heat cooking methods like deep frying.  · Cook using healthy oils, such as olive, canola, or sunflower oil.  · Avoid cooking with butter, cream, or high-fat meats.  Meal planning  · Eat meals and snacks regularly, preferably at the same times every day. Avoid going long periods of time without eating.  · Eat foods high in fiber, such as fresh fruits, vegetables, beans, and whole grains. Talk to your dietitian about how many servings of carbs you can eat at each meal.  · Eat 4-6 ounces (oz) of lean protein each day, such as lean meat, chicken, fish, eggs, or tofu. One oz of lean protein is equal to:  ? 1 oz of meat, chicken, or fish.  ? 1 egg.  ? ¼ cup of tofu.  · Eat some foods each day that contain healthy fats, such as avocado, nuts, seeds, and fish.  Lifestyle  · Check your blood glucose regularly.  · Exercise regularly as told by your health care provider. This may include:  ? 150 minutes of moderate-intensity or vigorous-intensity exercise each week. This could be brisk walking, biking, or water aerobics.  ? Stretching and doing strength exercises, such as yoga or weightlifting, at least 2 times a week.  · Take medicines as told by your health care provider.  · Do not use any products that contain nicotine or tobacco, such as cigarettes and e-cigarettes. If you need help quitting, ask your health care provider.  · Work with a counselor or diabetes educator to identify strategies to manage stress and any emotional and social challenges.  Questions to ask a health care provider  · Do I need to meet with a diabetes educator?  · Do I need to meet with a dietitian?  · What number can I call if I have questions?  · When are the best times to check my blood glucose?  Where to find more  information:  · American Diabetes Association: diabetes.org  · Academy of Nutrition and Dietetics: www.eatright.org  · National Institute of Diabetes and Digestive and Kidney Diseases (NIH): www.niddk.nih.gov  Summary  · A healthy meal plan will help you control your blood glucose and maintain a healthy lifestyle.  · Working with a diet and nutrition specialist (dietitian) can help you make a meal plan that is best for you.  · Keep in mind that carbohydrates (carbs) and alcohol have immediate effects on your blood glucose levels. It is important to count carbs and to use alcohol carefully.  This information is not intended to   replace advice given to you by your health care provider. Make sure you discuss any questions you have with your health care provider.  Document Released: 03/17/2005 Document Revised: 01/18/2017 Document Reviewed: 07/25/2016  Elsevier Interactive Patient Education © 2019 [employer]

## 2018-07-16 NOTE — Progress Notes (Signed)
Subjective:  Patient ID: [name], male    DOB: 04/28/1961  Age: 58 y.o. MRN: [id number]  CC: Diabetes and Anxiety   HPI AMEET SANDY is a 58 year old male with a history of type 2 diabetes mellitus (A1c 8.1), hypertension, anxiety and depression, diabetic neuropathy who presents today to establish care with me. He was last seen in the clinic over 1 year ago and has been receiving his refills from the emergency room. His anxiety and depression have worsened since he ran out of BuSpar and he is requesting a refill today.  He does have multiple psychosocial stressors including housing challenges which trigger his anxiety symptoms but while he was on BuSpar his symptoms were controlled. He endorses compliance with his diabetic medications and denies hypoglycemia. He is needing refills of his medications today.  Past Medical History:  Diagnosis Date  . Blood clot in vein   . Diabetes mellitus   . DVT (deep venous thrombosis) ([place])   . Hypertension     Past Surgical History:  Procedure Laterality Date  . APPENDECTOMY    . VEIN LIGATION AND STRIPPING     of left leg    Allergies  Allergen Reactions  . Erythromycin Base Anxiety  . Benzodiazepines     PT STATES HE CANNOT TAKE THESE BECAUSE THEY ALMOST KILLED HIM  . Xanax [Alprazolam]   . Methocarbamol Anxiety and Other (See Comments)    Stomach cramping, weakness  . Sulfa Antibiotics Nausea And Vomiting and Other (See Comments)    Cramping in stomach and hyperventilate.      Outpatient Medications Prior to Visit  Medication Sig Dispense Refill  . acetaminophen (TYLENOL) 325 MG tablet Take 650 mg by mouth every 6 (six) hours as needed.    Marland Kitchen albuterol (PROVENTIL HFA;VENTOLIN HFA) 108 (90 Base) MCG/ACT inhaler Inhale 2 puffs into the lungs every 6 (six) hours as needed for wheezing or shortness of breath. 1 Inhaler 2  . Blood Glucose Monitoring Suppl (TRUE METRIX METER) w/Device KIT 1 each by Does not apply route 3 (three)  times daily. 1 kit 0  . buPROPion (WELLBUTRIN) 100 MG tablet Take 1 tablet (100 mg total) by mouth 2 (two) times daily. 60 tablet 3  . citalopram (CELEXA) 10 MG tablet Take 1 tablet (10 mg total) by mouth daily. 30 tablet 0  . cyclobenzaprine (FLEXERIL) 10 MG tablet Take 1 tablet (10 mg total) by mouth 3 (three) times daily as needed for muscle spasms. 15 tablet 0  . gabapentin (NEURONTIN) 100 MG capsule Take 1 capsule (100 mg total) by mouth 3 (three) times daily. 90 capsule 0  . gemfibrozil (LOPID) 600 MG tablet Take 1 tablet (600 mg total) by mouth 2 (two) times daily before a meal. 180 tablet 3  . glucose blood (TRUE METRIX BLOOD GLUCOSE TEST) test strip Use as instructed 100 each 12  . Syringe, Disposable, (30-35CC SYRINGE) 35 ML MISC Use for injecting insulin 100 each 1  . TRUEPLUS LANCETS 28G MISC Check blood sugar tid 100 each 2  . gabapentin (NEURONTIN) 100 MG capsule Take 1 capsule (100 mg total) by mouth 3 (three) times daily. 90 capsule 0  . glimepiride (AMARYL) 1 MG tablet Take 1 tablet (1 mg total) by mouth daily with breakfast. 90 tablet 3  . insulin aspart (NOVOLOG) 100 UNIT/ML injection Inject 10units if blood sugar is greater 300; Inject 15units if blood sugar is greater 400 1 vial 0  . meloxicam (MOBIC) 15 MG tablet  Take 1 tablet (15 mg total) by mouth daily. 30 tablet 0  . metFORMIN (GLUCOPHAGE) 1000 MG tablet Take 1 tablet (1,000 mg total) by mouth 2 (two) times daily with a meal. 180 tablet 3  . metoprolol tartrate (LOPRESSOR) 50 MG tablet Take 1 tablet (50 mg total) by mouth 2 (two) times daily. 180 tablet 3  . busPIRone (BUSPAR) 10 MG tablet Take 1 tablet (10 mg total) by mouth 3 (three) times daily. (Patient not taking: Reported on 07/16/2018) 90 tablet 3   No facility-administered medications prior to visit.     ROS Review of Systems  Constitutional: Negative for activity change and appetite change.  HENT: Negative for sinus pressure and sore throat.   Eyes: Negative  for visual disturbance.  Respiratory: Negative for cough, chest tightness and shortness of breath.   Cardiovascular: Negative for chest pain and leg swelling.  Gastrointestinal: Negative for abdominal distention, abdominal pain, constipation and diarrhea.  Endocrine: Negative.   Genitourinary: Negative for dysuria.  Musculoskeletal: Negative for joint swelling and myalgias.  Skin: Negative for rash.  Allergic/Immunologic: Negative.   Neurological: Negative for weakness, light-headedness and numbness.  Psychiatric/Behavioral: Negative for dysphoric mood and suicidal ideas.    Objective:  BP (!) 166/97   Pulse 87   Temp 98 F (36.7 C) (Oral)   Wt 200 lb 12.8 oz (91.1 kg)   SpO2 98%   BMI 28.01 kg/m   BP/Weight 07/16/2018 24/40/1027 08/08/3662  Systolic BP 403 474 259  Diastolic BP 97 91 97  Wt. (Lbs) 200.8 200 195  BMI 28.01 27.89 27.2      Physical Exam Constitutional:      Appearance: He is well-developed.  Cardiovascular:     Rate and Rhythm: Normal rate.     Heart sounds: Normal heart sounds. No murmur.  Pulmonary:     Effort: Pulmonary effort is normal.     Breath sounds: Normal breath sounds. No wheezing or rales.  Chest:     Chest wall: No tenderness.  Abdominal:     General: Bowel sounds are normal. There is no distension.     Palpations: Abdomen is soft. There is no mass.     Tenderness: There is no abdominal tenderness.  Musculoskeletal: Normal range of motion.  Neurological:     Mental Status: He is alert and oriented to person, place, and time.  Psychiatric:        Mood and Affect: Mood normal.        Behavior: Behavior normal.      CMP Latest Ref Rng & Units 09/28/2017 09/19/2017 02/08/2017  Glucose 65 - 99 mg/dL 207(H) 137(H) 253(H)  BUN 6 - 20 mg/dL '14 11 13  ' Creatinine 0.61 - 1.24 mg/dL 0.84 0.68 0.87  Sodium 135 - 145 mmol/L 129(L) 141 135  Potassium 3.5 - 5.1 mmol/L 4.1 4.6 4.6  Chloride 101 - 111 mmol/L 93(L) 104 101  CO2 22 - 32 mmol/L '25 27  26  ' Calcium 8.9 - 10.3 mg/dL 8.9 9.8 9.5  Total Protein 6.5 - 8.1 g/dL 7.6 - 7.1  Total Bilirubin 0.3 - 1.2 mg/dL 1.0 - 0.8  Alkaline Phos 38 - 126 U/L 61 - 63  AST 15 - 41 U/L 16 - 14(L)  ALT 17 - 63 U/L 13(L) - 14(L)    Lipid Panel     Component Value Date/Time   CHOL 181 12/05/2012 1317   TRIG 351 (H) 12/05/2012 1317   HDL 29 (L) 12/05/2012 1317   CHOLHDL  6.2 12/05/2012 1317   VLDL 70 (H) 12/05/2012 1317   LDLCALC 82 12/05/2012 1317    Lab Results  Component Value Date   HGBA1C 8.1 (A) 07/16/2018    Assessment & Plan:   1. Uncontrolled type 2 diabetes mellitus with ketoacidosis without coma, without long-term current use of insulin (HCC) Uncontrolled with A1c of 8.1 Increased dose of glimepiride Continue diabetic diet, lifestyle modifications - POCT glucose (manual entry) - POCT glycosylated hemoglobin (Hb A1C) - gabapentin (NEURONTIN) 100 MG capsule; Take 1 capsule (100 mg total) by mouth 3 (three) times daily.  Dispense: 90 capsule; Refill: 1 - glimepiride (AMARYL) 4 MG tablet; Take 1 tablet (4 mg total) by mouth daily with breakfast.  Dispense: 90 tablet; Refill: 1 - metFORMIN (GLUCOPHAGE) 1000 MG tablet; Take 1 tablet (1,000 mg total) by mouth 2 (two) times daily with a meal.  Dispense: 180 tablet; Refill: 1 - Microalbumin/Creatinine Ratio, Urine - CMP14+EGFR; Future - Lipid panel; Future  2. Adjustment disorder with mixed anxiety and depressed mood Uncontrolled due to running out of medications and underlying psychosocial stressors Medication refill of - busPIRone (BUSPAR) 10 MG tablet; Take 1 tablet (10 mg total) by mouth 3 (three) times daily.  Dispense: 270 tablet; Refill: 1  3. Tobacco use disorder Counseled on cessation  4. Neuropathic pain of ankle, left Stable - gabapentin (NEURONTIN) 100 MG capsule; Take 1 capsule (100 mg total) by mouth 3 (three) times daily.  Dispense: 90 capsule; Refill: 1  5. Need for hepatitis C screening test - Hepatitis c  antibody (reflex); Future  6. Essential hypertension Uncontrolled due to running out of medications which I have refilled - metoprolol tartrate (LOPRESSOR) 50 MG tablet; Take 1 tablet (50 mg total) by mouth 2 (two) times daily.  Dispense: 180 tablet; Refill: 1   Meds ordered this encounter  Medications  . busPIRone (BUSPAR) 10 MG tablet    Sig: Take 1 tablet (10 mg total) by mouth 3 (three) times daily.    Dispense:  270 tablet    Refill:  1  . gabapentin (NEURONTIN) 100 MG capsule    Sig: Take 1 capsule (100 mg total) by mouth 3 (three) times daily.    Dispense:  90 capsule    Refill:  1  . glimepiride (AMARYL) 4 MG tablet    Sig: Take 1 tablet (4 mg total) by mouth daily with breakfast.    Dispense:  90 tablet    Refill:  1    Dose change  . metFORMIN (GLUCOPHAGE) 1000 MG tablet    Sig: Take 1 tablet (1,000 mg total) by mouth 2 (two) times daily with a meal.    Dispense:  180 tablet    Refill:  1  . metoprolol tartrate (LOPRESSOR) 50 MG tablet    Sig: Take 1 tablet (50 mg total) by mouth 2 (two) times daily.    Dispense:  180 tablet    Refill:  1    Follow-up: Return in about 3 months (around 10/15/2018) for follow up of chronic medical conditions.   Charlott Rakes MD

## 2018-07-17 LAB — MICROALBUMIN / CREATININE URINE RATIO
Creatinine, Urine: 32.2 mg/dL
Microalb/Creat Ratio: 57.5 mg/g creat — ABNORMAL HIGH (ref 0.0–30.0)
Microalbumin, Urine: 18.5 ug/mL

## 2018-07-29 ENCOUNTER — Emergency Department: Payer: Self-pay

## 2018-07-29 ENCOUNTER — Emergency Department
Admission: EM | Admit: 2018-07-29 | Discharge: 2018-07-29 | Disposition: A | Discharge status: Home / Self Care | Payer: Self-pay | Attending: Emergency Medicine | Admitting: Emergency Medicine

## 2018-07-29 ENCOUNTER — Other Ambulatory Visit: Payer: Self-pay

## 2018-07-29 DIAGNOSIS — E1110 Type 2 diabetes mellitus with ketoacidosis without coma: Secondary | ICD-10-CM | POA: Insufficient documentation

## 2018-07-29 DIAGNOSIS — F17210 Nicotine dependence, cigarettes, uncomplicated: Secondary | ICD-10-CM | POA: Insufficient documentation

## 2018-07-29 DIAGNOSIS — J181 Lobar pneumonia, unspecified organism: Secondary | ICD-10-CM | POA: Insufficient documentation

## 2018-07-29 DIAGNOSIS — I10 Essential (primary) hypertension: Secondary | ICD-10-CM | POA: Insufficient documentation

## 2018-07-29 DIAGNOSIS — E1140 Type 2 diabetes mellitus with diabetic neuropathy, unspecified: Secondary | ICD-10-CM | POA: Insufficient documentation

## 2018-07-29 DIAGNOSIS — Z79899 Other long term (current) drug therapy: Secondary | ICD-10-CM | POA: Insufficient documentation

## 2018-07-29 DIAGNOSIS — J189 Pneumonia, unspecified organism: Secondary | ICD-10-CM

## 2018-07-29 LAB — INFLUENZA PANEL BY PCR (TYPE A & B)
Influenza A By PCR: NEGATIVE
Influenza B By PCR: NEGATIVE

## 2018-07-29 LAB — CBC WITH DIFFERENTIAL/PLATELET
Abs Immature Granulocytes: 0.06 10*3/uL (ref 0.00–0.07)
BASOS PCT: 0 %
Basophils Absolute: 0 10*3/uL (ref 0.0–0.1)
Eosinophils Absolute: 0.1 10*3/uL (ref 0.0–0.5)
Eosinophils Relative: 1 %
HCT: 48.4 % (ref 39.0–52.0)
Hemoglobin: 16.7 g/dL (ref 13.0–17.0)
Immature Granulocytes: 1 %
Lymphocytes Relative: 6 %
Lymphs Abs: 0.7 10*3/uL (ref 0.7–4.0)
MCH: 32.1 pg (ref 26.0–34.0)
MCHC: 34.5 g/dL (ref 30.0–36.0)
MCV: 93.1 fL (ref 80.0–100.0)
Monocytes Absolute: 1.3 10*3/uL — ABNORMAL HIGH (ref 0.1–1.0)
Monocytes Relative: 11 %
Neutro Abs: 9.1 10*3/uL — ABNORMAL HIGH (ref 1.7–7.7)
Neutrophils Relative %: 81 %
Platelets: 53 10*3/uL — ABNORMAL LOW (ref 150–400)
RBC: 5.2 MIL/uL (ref 4.22–5.81)
RDW: 12.1 % (ref 11.5–15.5)
Smear Review: NORMAL
WBC: 11.3 10*3/uL — ABNORMAL HIGH (ref 4.0–10.5)
nRBC: 0 % (ref 0.0–0.2)

## 2018-07-29 LAB — BASIC METABOLIC PANEL
Anion gap: 7 (ref 5–15)
BUN: 15 mg/dL (ref 6–20)
CO2: 26 mmol/L (ref 22–32)
Calcium: 8.9 mg/dL (ref 8.9–10.3)
Chloride: 98 mmol/L (ref 98–111)
Creatinine, Ser: 0.83 mg/dL (ref 0.61–1.24)
GFR calc Af Amer: >60 mL/min (ref >60)
Glucose, Bld: 199 mg/dL — ABNORMAL HIGH (ref 70–99)
Potassium: 4.1 mmol/L (ref 3.5–5.1)
SODIUM: 131 mmol/L — AB (ref 135–145)

## 2018-07-29 LAB — LACTIC ACID, PLASMA: Lactic Acid, Venous: 1.1 mmol/L (ref 0.5–1.9)

## 2018-07-29 MED ORDER — SODIUM CHLORIDE 0.9 % IV BOLUS
1000.0000 mL | Freq: Once | INTRAVENOUS | Status: AC
  Administered 2018-07-29: 1000 mL via INTRAVENOUS

## 2018-07-29 MED ORDER — AMOXICILLIN-POT CLAVULANATE 875-125 MG PO TABS: 1.0000 | ORAL_TABLET | Freq: Two times a day (BID) | ORAL | 0 refills | Status: AC

## 2018-07-29 MED ORDER — KETOROLAC TROMETHAMINE 30 MG/ML IJ SOLN
30.0000 mg | Freq: Once | INTRAMUSCULAR | Status: AC
  Administered 2018-07-29: 30 mg via INTRAVENOUS
  Filled 2018-07-29: qty 1

## 2018-07-29 MED ORDER — AMOXICILLIN-POT CLAVULANATE 875-125 MG PO TABS
1.0000 | ORAL_TABLET | Freq: Once | ORAL | Status: AC
  Administered 2018-07-29: 1 via ORAL
  Filled 2018-07-29: qty 1

## 2018-07-29 NOTE — ED Provider Notes (Signed)
[facility] Emergency Department Provider Note ____________________________________________   First MD Initiated Contact with Patient 07/29/18 [phone]     (approximate)  I have reviewed the triage vital signs and the nursing notes.   HISTORY  Chief Complaint flu like symptoms    HPI [name] is a 58 y.o. male with PMH as noted below who presents with cough over the last several days, productive of yellow sputum, associated with body aches and malaise, as well as with mild shortness of breath.  The patient reports few episodes of vomiting and some diarrhea as well.  Past Medical History:  Diagnosis Date  . Blood clot in vein   . Diabetes mellitus   . DVT (deep venous thrombosis) ([place])   . Hypertension     Patient Active Problem List   Diagnosis Date Noted  . Neuropathic pain of ankle, left 01/25/2017  . Adjustment disorder with mixed anxiety and depressed mood 08/27/2016  . Hypertriglyceridemia 06/30/2016  . Thrombocytopenia, unspecified ([place]) 12/06/2012  . Diabetic neuropathy ([place]) 12/05/2012  . Tobacco use disorder 12/05/2012  . Dyslipidemia 12/05/2012  . Uncontrolled type 2 diabetes mellitus with ketoacidosis without coma, without long-term current use of insulin ([place]) 05/11/2011  . Essential hypertension 05/11/2011    Past Surgical History:  Procedure Laterality Date  . APPENDECTOMY    . VEIN LIGATION AND STRIPPING     of left leg    Prior to Admission medications   Medication Sig Start Date End Date Taking? Authorizing Provider  acetaminophen (TYLENOL) 325 MG tablet Take 650 mg by mouth every 6 (six) hours as needed.    [provider]  albuterol (PROVENTIL HFA;VENTOLIN HFA) 108 (90 Base) MCG/ACT inhaler Inhale 2 puffs into the lungs every 6 (six) hours as needed for wheezing or shortness of breath. 04/18/17   Johnn Hai, PA-C  Blood Glucose Monitoring Suppl (TRUE METRIX METER) w/Device KIT 1 each by Does not apply  route 3 (three) times daily. 01/25/17   Tresa Garter, MD  buPROPion (WELLBUTRIN) 100 MG tablet Take 1 tablet (100 mg total) by mouth 2 (two) times daily. 06/21/17   Tresa Garter, MD  busPIRone (BUSPAR) 10 MG tablet Take 1 tablet (10 mg total) by mouth 3 (three) times daily. 07/16/18   Charlott Rakes, MD  citalopram (CELEXA) 10 MG tablet Take 1 tablet (10 mg total) by mouth daily. 06/21/17   Tresa Garter, MD  cyclobenzaprine (FLEXERIL) 10 MG tablet Take 1 tablet (10 mg total) by mouth 3 (three) times daily as needed for muscle spasms. 11/08/17   Cuthriell, Charline Bills, PA-C  gabapentin (NEURONTIN) 100 MG capsule Take 1 capsule (100 mg total) by mouth 3 (three) times daily. 09/20/17 09/20/18  Loney Hering, MD  gabapentin (NEURONTIN) 100 MG capsule Take 1 capsule (100 mg total) by mouth 3 (three) times daily. 07/16/18   Charlott Rakes, MD  gemfibrozil (LOPID) 600 MG tablet Take 1 tablet (600 mg total) by mouth 2 (two) times daily before a meal. 06/21/17   Jegede, Marlena Clipper, MD  glimepiride (AMARYL) 4 MG tablet Take 1 tablet (4 mg total) by mouth daily with breakfast. 07/16/18   Charlott Rakes, MD  glucose blood (TRUE METRIX BLOOD GLUCOSE TEST) test strip Use as instructed 01/31/17   Tresa Garter, MD  metFORMIN (GLUCOPHAGE) 1000 MG tablet Take 1 tablet (1,000 mg total) by mouth 2 (two) times daily with a meal. 07/16/18   Charlott Rakes, MD  metoprolol tartrate (LOPRESSOR)  50 MG tablet Take 1 tablet (50 mg total) by mouth 2 (two) times daily. 07/16/18   Charlott Rakes, MD  Syringe, Disposable, (30-35CC SYRINGE) 35 ML MISC Use for injecting insulin 01/25/17   Tresa Garter, MD  TRUEPLUS LANCETS 28G MISC Check blood sugar tid 01/25/17   Tresa Garter, MD    Allergies Erythromycin base; Benzodiazepines; Xanax [alprazolam]; Methocarbamol; and Sulfa antibiotics  Family History  Problem Relation Age of Onset  . Heart disease Mother   . Diabetes Father   .  Heart disease Father     Social History Social History   Tobacco Use  . Smoking status: Current Every Day Smoker    Packs/day: 1.00    Types: Cigarettes  . Smokeless tobacco: Never Used  Substance Use Topics  . Alcohol use: No  . Drug use: No    Review of Systems  Constitutional: Positive for chills and weakness. Eyes: No redness. ENT: Positive for congestion. Cardiovascular: Denies chest pain. Respiratory: Positive for shortness of breath. Gastrointestinal: Positive for vomiting. Genitourinary: Negative for flank pain.  Musculoskeletal: Positive for myalgias. Skin: Negative for rash. Neurological: Negative for headache.   ____________________________________________   PHYSICAL EXAM:  VITAL SIGNS: ED Triage Vitals  Enc Vitals Group     BP 07/29/18 0407 (!) 167/92     Pulse Rate 07/29/18 0407 92     Resp 07/29/18 0407 20     Temp 07/29/18 0407 99.6 F (37.6 C)     Temp Source 07/29/18 0407 Oral     SpO2 07/29/18 0407 96 %     Weight 07/29/18 0408 210 lb (95.3 kg)     Height 07/29/18 0408 '5\' 11"'  (1.803 m)     Head Circumference --      Peak Flow --      Pain Score 07/29/18 0408 10     Pain Loc --      Pain Edu? --      Excl. in [place]? --     Constitutional: Alert and oriented.  Relatively well appearing and in no acute distress. Eyes: Conjunctivae are normal.  Head: Atraumatic. Nose: No congestion/rhinnorhea. Mouth/Throat: Mucous membranes are somewhat dry.   Neck: Normal range of motion.  Cardiovascular: Normal rate, regular rhythm. Grossly normal heart sounds.  Good peripheral circulation. Respiratory: Normal respiratory effort.  No retractions. Lungs CTAB. Gastrointestinal: No distention.  Musculoskeletal: Extremities warm and well perfused.  Neurologic:  Normal speech and language. No gross focal neurologic deficits are appreciated.  Skin:  Skin is warm and dry. No rash noted. Psychiatric: Mood and affect are normal. Speech and behavior are  normal.  ____________________________________________   LABS (all labs ordered are listed, but only abnormal results are displayed)  Labs Reviewed  BASIC METABOLIC PANEL - Abnormal; Notable for the following components:      Result Value   Sodium 131 (*)    Glucose, Bld 199 (*)    All other components within normal limits  CBC WITH DIFFERENTIAL/PLATELET - Abnormal; Notable for the following components:   WBC 11.3 (*)    Platelets 53 (*)    Neutro Abs 9.1 (*)    Monocytes Absolute 1.3 (*)    All other components within normal limits  INFLUENZA PANEL BY PCR (TYPE A & B)  LACTIC ACID, PLASMA   ____________________________________________  EKG   ____________________________________________  RADIOLOGY  CXR: Right lower lobe pneumonia  ____________________________________________   PROCEDURES  Procedure(s) performed: No  Procedures  Critical Care performed: No ____________________________________________  INITIAL IMPRESSION / ASSESSMENT AND PLAN / ED COURSE  Pertinent labs & imaging results that were available during my care of the patient were reviewed by me and considered in my medical decision making (see chart for details).  58 year old male with PMH as noted above presents with productive cough, malaise, body aches, and generalized weakness over the last several days.  On exam he is relatively well-appearing and his vital signs are normal except for borderline temp.  The remainder of the exam is as described above.  Chest x-ray and influenza swab were obtained from triage.  Flu is negative, but the x-ray does reveal findings consistent with right lower lobe pneumonia.  Given the patient's generalized symptoms I will obtain lab work-up to evaluate for electrolyte abnormalities, dehydration, and sepsis, although my clinical suspicion for sepsis is very low.  We will give fluids and Toradol for symptomatic treatment.  I anticipate that the patient will be  appropriate for outpatient treatment.  ----------------------------------------- 12:00 PM on 07/29/2018 -----------------------------------------  The lab work-up is unremarkable, except for thrombocytopenia which appears to be chronic for the patient and is not significantly changed from labs from 10 months ago.  There is no evidence of sepsis. Influenza is negative. The patient is feeling better after fluids and Toradol.   At this time, the patient is stable for discharge home.  I discussed with him his social situation and he states that he does have a friend who he can borrow money from in order to purchase the medication.  He states that if I give a prescription it should be able to be filled.  He reports that when he has had similar symptoms he is done well with Augmentin, and I think this would be a reasonable antibiotic choice for CAP rather than a macrolide.  First dose was given in the ED.  I counseled the patient on the results of the work-up.  Return precautions given, and he expressed understanding. ____________________________________________   FINAL CLINICAL IMPRESSION(S) / ED DIAGNOSES  Final diagnoses:  Community acquired pneumonia of right lower lobe of lung ([place])      NEW MEDICATIONS STARTED DURING THIS VISIT:  New Prescriptions   No medications on file     Note:  This document was prepared using Dragon voice recognition software and may include unintentional dictation errors.    Arta Silence, MD 07/29/18 1202

## 2018-07-29 NOTE — ED Notes (Addendum)
Pt reports "I will be homeless on Monday", living with other people and pt sleeping on floor, C/O aches all over with fever and chills since Thursday, N/V yesterday, SOB with exertion, dailey smoker, productive cough (clear to brown)  Pt requests help with medications if any prescribed, pt reports no insurance, or money

## 2018-07-29 NOTE — ED Notes (Signed)
Pt up to toilet 

## 2018-07-29 NOTE — ED Notes (Signed)
Called lab and spoke with Darl Pikes about CBC for pt. Per Darl Pikes she is working on it at this time and was having to do a platelet count. MD informed.

## 2018-07-29 NOTE — ED Notes (Signed)
Given information about medication management for tomorrow morning

## 2018-07-29 NOTE — ED Notes (Signed)
Report given to Susan RN 

## 2018-07-29 NOTE — Discharge Instructions (Signed)
Take the antibiotic as prescribed and finish the full course.  You can use Tylenol or Motrin for pain, and make sure to drink plenty of fluids.  Return to the ER for new, worsening, or persistent cough, shortness of breath, weakness, fever, or any other new or worsening symptoms that concern you.  You should also call or come back to the ER if for any reason you are unable to get or pay for the medication.

## 2018-07-29 NOTE — ED Triage Notes (Signed)
Pt states has had two days of chills, fever, diarrhea, vomiting, sore throat and cough. Pt states has a productive cough with yellow sputum. Pt appears in no acute distress. Pt masked in triage.

## 2018-08-14 NOTE — Telephone Encounter (Signed)
error 

## 2018-08-21 ENCOUNTER — Other Ambulatory Visit: Payer: Self-pay

## 2018-08-21 ENCOUNTER — Emergency Department
Admission: EM | Admit: 2018-08-21 | Discharge: 2018-08-21 | Disposition: A | Discharge status: Home / Self Care | Payer: Self-pay | Attending: Emergency Medicine | Admitting: Emergency Medicine

## 2018-08-21 DIAGNOSIS — F4323 Adjustment disorder with mixed anxiety and depressed mood: Secondary | ICD-10-CM | POA: Insufficient documentation

## 2018-08-21 DIAGNOSIS — Z760 Encounter for issue of repeat prescription: Secondary | ICD-10-CM | POA: Insufficient documentation

## 2018-08-21 DIAGNOSIS — F17210 Nicotine dependence, cigarettes, uncomplicated: Secondary | ICD-10-CM | POA: Insufficient documentation

## 2018-08-21 DIAGNOSIS — E1110 Type 2 diabetes mellitus with ketoacidosis without coma: Secondary | ICD-10-CM

## 2018-08-21 DIAGNOSIS — Z79899 Other long term (current) drug therapy: Secondary | ICD-10-CM | POA: Insufficient documentation

## 2018-08-21 DIAGNOSIS — I10 Essential (primary) hypertension: Secondary | ICD-10-CM | POA: Insufficient documentation

## 2018-08-21 DIAGNOSIS — E119 Type 2 diabetes mellitus without complications: Secondary | ICD-10-CM | POA: Insufficient documentation

## 2018-08-21 MED ORDER — METFORMIN HCL 1000 MG PO TABS: 1000.0000 mg | ORAL_TABLET | Freq: Two times a day (BID) | ORAL | 0 refills | Status: DC

## 2018-08-21 MED ORDER — GLIMEPIRIDE 4 MG PO TABS: 4.0000 mg | ORAL_TABLET | Freq: Every day | ORAL | 0 refills | Status: DC

## 2018-08-21 MED ORDER — METOPROLOL TARTRATE 50 MG PO TABS
50.0000 mg | ORAL_TABLET | Freq: Once | ORAL | Status: AC
  Administered 2018-08-21: 50 mg via ORAL
  Filled 2018-08-21: qty 1

## 2018-08-21 MED ORDER — METFORMIN HCL 500 MG PO TABS
1000.0000 mg | ORAL_TABLET | Freq: Once | ORAL | Status: AC
  Administered 2018-08-21: 1000 mg via ORAL
  Filled 2018-08-21: qty 2

## 2018-08-21 MED ORDER — METOPROLOL TARTRATE 50 MG PO TABS: 50.0000 mg | ORAL_TABLET | Freq: Two times a day (BID) | ORAL | 0 refills | Status: DC

## 2018-08-21 NOTE — ED Notes (Addendum)
See triage note  Presents with requesting medication refill  Denies any other complaints  States he was not able to get to his appt in [employer]

## 2018-08-21 NOTE — ED Provider Notes (Signed)
[facility] Emergency Department Provider Note  ____________________________________________  Time seen: Approximately 3:31 PM  I have reviewed the triage vital signs and the nursing notes.   HISTORY  Chief Complaint Medication Refill    HPI [name] is a 58 y.o. male that presents to emergency department for medication refill.  Patient states that he went to [facility] today and was told that he does not have any refills on his metoprolol, metformin, glipizide.  Patient thought that he had refills through April.  Patient states that he has been on metoprolol since 1990, metformin since around 2010, glipizide since around 2016.  He has not had any complications with medications.  He says that he will go to his clinic tomorrow but does not have any medication for tonight or tomorrow before he is able to be seen.   Past Medical History:  Diagnosis Date  . Blood clot in vein   . Diabetes mellitus   . DVT (deep venous thrombosis) ([place])   . Hypertension     Patient Active Problem List   Diagnosis Date Noted  . Neuropathic pain of ankle, left 01/25/2017  . Adjustment disorder with mixed anxiety and depressed mood 08/27/2016  . Hypertriglyceridemia 06/30/2016  . Thrombocytopenia, unspecified ([place]) 12/06/2012  . Diabetic neuropathy ([place]) 12/05/2012  . Tobacco use disorder 12/05/2012  . Dyslipidemia 12/05/2012  . Uncontrolled type 2 diabetes mellitus with ketoacidosis without coma, without long-term current use of insulin ([place]) 05/11/2011  . Essential hypertension 05/11/2011    Past Surgical History:  Procedure Laterality Date  . APPENDECTOMY    . VEIN LIGATION AND STRIPPING     of left leg    Prior to Admission medications   Medication Sig Start Date End Date Taking? Authorizing Provider  acetaminophen (TYLENOL) 325 MG tablet Take 650 mg by mouth every 6 (six) hours as needed.    [provider]  albuterol (PROVENTIL HFA;VENTOLIN HFA) 108  (90 Base) MCG/ACT inhaler Inhale 2 puffs into the lungs every 6 (six) hours as needed for wheezing or shortness of breath. 04/18/17   Johnn Hai, PA-C  Blood Glucose Monitoring Suppl (TRUE METRIX METER) w/Device KIT 1 each by Does not apply route 3 (three) times daily. 01/25/17   Tresa Garter, MD  buPROPion (WELLBUTRIN) 100 MG tablet Take 1 tablet (100 mg total) by mouth 2 (two) times daily. 06/21/17   Tresa Garter, MD  busPIRone (BUSPAR) 10 MG tablet Take 1 tablet (10 mg total) by mouth 3 (three) times daily. 07/16/18   Charlott Rakes, MD  citalopram (CELEXA) 10 MG tablet Take 1 tablet (10 mg total) by mouth daily. 06/21/17   Tresa Garter, MD  cyclobenzaprine (FLEXERIL) 10 MG tablet Take 1 tablet (10 mg total) by mouth 3 (three) times daily as needed for muscle spasms. 11/08/17   Cuthriell, Charline Bills, PA-C  gabapentin (NEURONTIN) 100 MG capsule Take 1 capsule (100 mg total) by mouth 3 (three) times daily. 09/20/17 09/20/18  Loney Hering, MD  gabapentin (NEURONTIN) 100 MG capsule Take 1 capsule (100 mg total) by mouth 3 (three) times daily. 07/16/18   Charlott Rakes, MD  gemfibrozil (LOPID) 600 MG tablet Take 1 tablet (600 mg total) by mouth 2 (two) times daily before a meal. 06/21/17   Tresa Garter, MD  glimepiride (AMARYL) 4 MG tablet Take 1 tablet (4 mg total) by mouth daily with breakfast. 08/21/18   Laban Emperor, PA-C  glucose blood (TRUE METRIX BLOOD GLUCOSE TEST)  test strip Use as instructed 01/31/17   Tresa Garter, MD  metFORMIN (GLUCOPHAGE) 1000 MG tablet Take 1 tablet (1,000 mg total) by mouth 2 (two) times daily with a meal. 08/21/18   Laban Emperor, PA-C  metoprolol tartrate (LOPRESSOR) 50 MG tablet Take 1 tablet (50 mg total) by mouth 2 (two) times daily. 08/21/18   Laban Emperor, PA-C  Syringe, Disposable, (30-35CC SYRINGE) 35 ML MISC Use for injecting insulin 01/25/17   Tresa Garter, MD  TRUEPLUS LANCETS 28G MISC Check blood sugar  tid 01/25/17   Tresa Garter, MD    Allergies Erythromycin base; Benzodiazepines; Xanax [alprazolam]; Methocarbamol; and Sulfa antibiotics  Family History  Problem Relation Age of Onset  . Heart disease Mother   . Diabetes Father   . Heart disease Father     Social History Social History   Tobacco Use  . Smoking status: Current Every Day Smoker    Packs/day: 1.00    Types: Cigarettes  . Smokeless tobacco: Never Used  Substance Use Topics  . Alcohol use: No  . Drug use: No     Review of Systems  Cardiovascular: No chest pain. Respiratory:No SOB. Gastrointestinal: No abdominal pain.  Musculoskeletal: Negative for musculoskeletal pain. Skin: Negative for rash, abrasions, lacerations, ecchymosis. Neurological: Negative for headaches   ____________________________________________   PHYSICAL EXAM:  VITAL SIGNS: ED Triage Vitals  Enc Vitals Group     BP 08/21/18 1518 (!) 147/96     Pulse Rate 08/21/18 1518 92     Resp 08/21/18 1518 18     Temp 08/21/18 1518 98.2 F (36.8 C)     Temp Source 08/21/18 1518 Oral     SpO2 08/21/18 1518 96 %     Weight 08/21/18 1519 205 lb (93 kg)     Height 08/21/18 1519 '5\' 11"'  (1.803 m)     Head Circumference --      Peak Flow --      Pain Score 08/21/18 1518 0     Pain Loc --      Pain Edu? --      Excl. in [place]? --      Constitutional: Alert and oriented. Well appearing and in no acute distress. Eyes: Conjunctivae are normal. PERRL. EOMI. Head: Atraumatic. ENT:      Ears:      Nose: No congestion/rhinnorhea.      Mouth/Throat: Mucous membranes are moist.  Neck: No stridor.  Cardiovascular: Normal rate, regular rhythm.  Good peripheral circulation. Respiratory: Normal respiratory effort without tachypnea or retractions. Lungs CTAB. Good air entry to the bases with no decreased or absent breath sounds. Musculoskeletal: Full range of motion to all extremities. No gross deformities appreciated. Neurologic:  Normal  speech and language. No gross focal neurologic deficits are appreciated.  Skin:  Skin is warm, dry and intact. No rash noted. Psychiatric: Mood and affect are normal. Speech and behavior are normal. Patient exhibits appropriate insight and judgement.   ____________________________________________   LABS (all labs ordered are listed, but only abnormal results are displayed)  Labs Reviewed - No data to display ____________________________________________  EKG   ____________________________________________  RADIOLOGY   No results found.  ____________________________________________    PROCEDURES  Procedure(s) performed:    Procedures    Medications  metoprolol tartrate (LOPRESSOR) tablet 50 mg (50 mg Oral Given 08/21/18 1613)  metFORMIN (GLUCOPHAGE) tablet 1,000 mg (1,000 mg Oral Given 08/21/18 1612)     ____________________________________________   INITIAL IMPRESSION / ASSESSMENT AND PLAN /  ED COURSE  Pertinent labs & imaging results that were available during my care of the patient were reviewed by me and considered in my medical decision making (see chart for details).  Review of the [place] CSRS was performed in accordance of the [place] prior to dispensing any controlled drugs.     Patient presented to emergency department for medication refill.  Vital signs and exam are reassuring.  Patient is requesting his evening dose of his metoprolol and metformin here in the emergency department.  His medications were refilled for 1 month.  Patient is going to take the shuttle tomorrow or Thursday to his clinic.  Patient will be discharged home with prescriptions for metoprolol, glipizide, metformin. Patient is to follow up with primary care as directed. Patient is given ED precautions to return to the ED for any worsening or new symptoms.     ____________________________________________  FINAL CLINICAL IMPRESSION(S) / ED DIAGNOSES  Final diagnoses:  Encounter for  medication refill      NEW MEDICATIONS STARTED DURING THIS VISIT:  ED Discharge Orders         Ordered    metFORMIN (GLUCOPHAGE) 1000 MG tablet  2 times daily with meals     08/21/18 1559    metoprolol tartrate (LOPRESSOR) 50 MG tablet  2 times daily     08/21/18 1559    glimepiride (AMARYL) 4 MG tablet  Daily with breakfast    Note to Pharmacy:  Dose change   08/21/18 1559              This chart was dictated using voice recognition software/Dragon. Despite best efforts to proofread, errors can occur which can change the meaning. Any change was purely unintentional.    Laban Emperor, PA-C 08/21/18 2005    Eula Listen, MD 08/21/18 2011

## 2018-08-21 NOTE — ED Notes (Signed)
First nurse note: pt states he is here for refill of metopolol rx. States he tried to get refill at pharmacy and was told no more refills available. PCP will not authorize refill without seeing him in office and he is unable to find transport to PCP office in [place] until 1730, office closes at 1700.

## 2018-08-21 NOTE — ED Triage Notes (Signed)
Pt states he was unable to get to see doctor for med refill and went to [facility] pharmacy and there were no refills.   Pt states out of  50mg  metoprolol, 1000mg  metformin, 4mg  glimepiride. Took last doses yesterday and today.   A&O, ambulatory. No distress noted.

## 2018-09-04 ENCOUNTER — Ambulatory Visit: Payer: Self-pay | Attending: Nurse Practitioner | Admitting: Nurse Practitioner

## 2018-09-04 ENCOUNTER — Encounter: Payer: Self-pay | Admitting: Nurse Practitioner

## 2018-09-04 VITALS — BP 153/96 | HR 92 | Temp 98.1°F | Ht 71.0 in | Wt 196.0 lb

## 2018-09-04 DIAGNOSIS — Z09 Encounter for follow-up examination after completed treatment for conditions other than malignant neoplasm: Secondary | ICD-10-CM

## 2018-09-04 DIAGNOSIS — J189 Pneumonia, unspecified organism: Secondary | ICD-10-CM

## 2018-09-04 DIAGNOSIS — I10 Essential (primary) hypertension: Secondary | ICD-10-CM

## 2018-09-04 DIAGNOSIS — E1110 Type 2 diabetes mellitus with ketoacidosis without coma: Secondary | ICD-10-CM

## 2018-09-04 LAB — GLUCOSE, POCT (MANUAL RESULT ENTRY): POC Glucose: 85 mg/dl (ref 70–99)

## 2018-09-04 MED ORDER — METOPROLOL TARTRATE 50 MG PO TABS: 50.0000 mg | ORAL_TABLET | Freq: Two times a day (BID) | ORAL | 3 refills | Status: DC

## 2018-09-04 MED ORDER — GLIMEPIRIDE 4 MG PO TABS: 4.0000 mg | ORAL_TABLET | Freq: Every day | ORAL | 3 refills | Status: DC

## 2018-09-04 MED ORDER — METFORMIN HCL 1000 MG PO TABS: 1000.0000 mg | ORAL_TABLET | Freq: Two times a day (BID) | ORAL | 3 refills | Status: DC

## 2018-09-04 NOTE — Progress Notes (Signed)
Assessment & Plan:  [name] was seen today for hospitalization follow-up and medication refill.  Diagnoses and all orders for this visit:  Uncontrolled type 2 diabetes mellitus with ketoacidosis without coma, without long-term current use of insulin (HCC) -     Glucose (CBG) -     -     glimepiride (AMARYL) 4 MG tablet; Take 1 tablet (4 mg total) by mouth daily with breakfast for 30 days. -     metFORMIN (GLUCOPHAGE) 1000 MG tablet; Take 1 tablet (1,000 mg total) by mouth 2 (two) times daily with a meal for 30 days. Continue blood sugar control as discussed in office today, low carbohydrate diet, and regular physical exercise as tolerated, 150 minutes per week (30 min each day, 5 days per week, or 50 min 3 days per week). Keep blood sugar logs with fasting goal of 90-130 mg/dl, post prandial (after you eat) less than 180.  For Hypoglycemia: BS <60 and Hyperglycemia BS >400; contact the clinic ASAP. Annual eye exams and foot exams are recommended.   Essential hypertension -     metoprolol tartrate (LOPRESSOR) 50 MG tablet; Take 1 tablet (50 mg total) by mouth 2 (two) times daily. Continue all antihypertensives as prescribed.  Remember to bring in your blood pressure log with you for your follow up appointment.  DASH/Mediterranean Diets are healthier choices for HTN.  BP Readings from Last 3 Encounters:  09/04/18 (!) 153/96  08/21/18 (!) 147/96  07/29/18 139/88  Not controlled. He has not picked up his BP medications. States he will be receiving money for some work he did for a friend soon and will pick up medications at that time.   Pneumonia, unspecified organism -     DG Chest 2 View; Future Patient has been advised to apply for financial assistance and schedule to see our financial counselor.    Patient has been counseled on age-appropriate routine health concerns for screening and prevention. These are reviewed and up-to-date. Referrals have been placed accordingly. Immunizations  are up-to-date or declined.    Subjective:   Chief Complaint  Patient presents with  . Hospitalization Follow-up    Pt. is here for HFU.  . Medication Refill    Pt. is asking for meformin, metoprolol, glimeperide   HPI [name] 58 y.o. male presents to office today for hospital follow up. He has a history of DM, HTN and DVT. Reports being Homeless, financial stressors and working odd jobs to make money. He is also requesting refills of his DM and HTN medications. Last appt with PCP was 07-16-2018 and he has a f/u appt for his chronic medical conditions scheduled for April 2020.   Medication Refills He was seen in the ED on 08-21-2018 requesting medication refills. He stated [facility] did not have his medications. Unfortunately he did not check with the community clinic pharmacy prior as his medications had been sent there. He was requesting medication be given for the next 24-48 hours. At that time his medications (metformin, metoprolol tartrate, amaryl) were refilled for one month which he still has not picked up as of today. He states he does not have money to pick up his medications and will have to wait until he gets paid by his friend for working. Medications were sent to the pharmacy of his choice today.    PNA Evaluated in the ED on 07-29-2018 with flu like symptoms. CXR showed RLL PNA. He was given IVF, Toradol and treated with Augmentin. He states  today that he did pick his antibiotic up from the pharmacy and completed the prescribed dose. He denies any flu like symptoms, chest pain, shortness of breath or cough today.    Review of Systems  Constitutional: Negative for fever, malaise/fatigue and weight loss.  HENT: Negative.  Negative for nosebleeds.   Eyes: Negative.  Negative for blurred vision, double vision and photophobia.  Respiratory: Negative.  Negative for cough and shortness of breath.   Cardiovascular: Negative.  Negative for chest pain, palpitations and leg swelling.    Gastrointestinal: Negative.  Negative for heartburn, nausea and vomiting.  Musculoskeletal: Negative.  Negative for myalgias.  Neurological: Negative.  Negative for dizziness, focal weakness, seizures and headaches.  Psychiatric/Behavioral: Negative for suicidal ideas.    Past Medical History:  Diagnosis Date  . Blood clot in vein   . Diabetes mellitus   . DVT (deep venous thrombosis) ([place])   . Hypertension     Past Surgical History:  Procedure Laterality Date  . APPENDECTOMY    . VEIN LIGATION AND STRIPPING     of left leg    Family History  Problem Relation Age of Onset  . Heart disease Mother   . Diabetes Father   . Heart disease Father     Social History Reviewed with no changes to be made today.   Outpatient Medications Prior to Visit  Medication Sig Dispense Refill  . acetaminophen (TYLENOL) 325 MG tablet Take 650 mg by mouth every 6 (six) hours as needed.    Marland Kitchen albuterol (PROVENTIL HFA;VENTOLIN HFA) 108 (90 Base) MCG/ACT inhaler Inhale 2 puffs into the lungs every 6 (six) hours as needed for wheezing or shortness of breath. 1 Inhaler 2  . buPROPion (WELLBUTRIN) 100 MG tablet Take 1 tablet (100 mg total) by mouth 2 (two) times daily. 60 tablet 3  . busPIRone (BUSPAR) 10 MG tablet Take 1 tablet (10 mg total) by mouth 3 (three) times daily. 270 tablet 1  . gabapentin (NEURONTIN) 100 MG capsule Take 1 capsule (100 mg total) by mouth 3 (three) times daily. 90 capsule 0  . gemfibrozil (LOPID) 600 MG tablet Take 1 tablet (600 mg total) by mouth 2 (two) times daily before a meal. 180 tablet 3  . glimepiride (AMARYL) 4 MG tablet Take 1 tablet (4 mg total) by mouth daily with breakfast. 30 tablet 0  . metFORMIN (GLUCOPHAGE) 1000 MG tablet Take 1 tablet (1,000 mg total) by mouth 2 (two) times daily with a meal. 60 tablet 0  . metoprolol tartrate (LOPRESSOR) 50 MG tablet Take 1 tablet (50 mg total) by mouth 2 (two) times daily. 60 tablet 0  . Blood Glucose Monitoring Suppl (TRUE  METRIX METER) w/Device KIT 1 each by Does not apply route 3 (three) times daily. (Patient not taking: Reported on 09/04/2018) 1 kit 0  . citalopram (CELEXA) 10 MG tablet Take 1 tablet (10 mg total) by mouth daily. (Patient not taking: Reported on 09/04/2018) 30 tablet 0  . cyclobenzaprine (FLEXERIL) 10 MG tablet Take 1 tablet (10 mg total) by mouth 3 (three) times daily as needed for muscle spasms. (Patient not taking: Reported on 09/04/2018) 15 tablet 0  . gabapentin (NEURONTIN) 100 MG capsule Take 1 capsule (100 mg total) by mouth 3 (three) times daily. (Patient not taking: Reported on 09/04/2018) 90 capsule 1  . glucose blood (TRUE METRIX BLOOD GLUCOSE TEST) test strip Use as instructed (Patient not taking: Reported on 09/04/2018) 100 each 12  . Syringe, Disposable, (30-35CC SYRINGE) 35  ML MISC Use for injecting insulin (Patient not taking: Reported on 09/04/2018) 100 each 1  . TRUEPLUS LANCETS 28G MISC Check blood sugar tid (Patient not taking: Reported on 09/04/2018) 100 each 2   No facility-administered medications prior to visit.     Allergies  Allergen Reactions  . Erythromycin Base Anxiety  . Benzodiazepines     PT STATES HE CANNOT TAKE THESE BECAUSE THEY ALMOST KILLED HIM  . Xanax [Alprazolam]   . Methocarbamol Anxiety and Other (See Comments)    Stomach cramping, weakness  . Sulfa Antibiotics Nausea And Vomiting and Other (See Comments)    Cramping in stomach and hyperventilate.        Objective:    BP (!) 153/96 (BP Location: Right Arm, Patient Position: Sitting, Cuff Size: Normal)   Pulse 92   Temp 98.1 F (36.7 C) (Oral)   Ht '5\' 11"'  (1.803 m)   Wt 196 lb (88.9 kg)   SpO2 96%   BMI 27.34 kg/m  Wt Readings from Last 3 Encounters:  09/04/18 196 lb (88.9 kg)  08/21/18 205 lb (93 kg)  07/29/18 210 lb (95.3 kg)    Physical Exam Vitals signs and nursing note reviewed.  Constitutional:      Appearance: He is well-developed.  HENT:     Head: Normocephalic and atraumatic.  Neck:       Musculoskeletal: Normal range of motion.  Cardiovascular:     Rate and Rhythm: Normal rate and regular rhythm.     Heart sounds: Normal heart sounds. No murmur. No friction rub. No gallop.   Pulmonary:     Effort: Pulmonary effort is normal. No tachypnea or respiratory distress.     Breath sounds: Normal breath sounds. No decreased breath sounds, wheezing, rhonchi or rales.  Chest:     Chest wall: No tenderness.  Abdominal:     General: Bowel sounds are normal.     Palpations: Abdomen is soft.  Musculoskeletal: Normal range of motion.  Skin:    General: Skin is warm and dry.  Neurological:     Mental Status: He is alert and oriented to person, place, and time.     Coordination: Coordination normal.  Psychiatric:        Behavior: Behavior normal. Behavior is cooperative.        Thought Content: Thought content normal.        Judgment: Judgment normal.          Patient has been counseled extensively about nutrition and exercise as well as the importance of adherence with medications and regular follow-up. The patient was given clear instructions to go to ER or return to medical center if symptoms don't improve, worsen or new problems develop. The patient verbalized understanding.   Follow-up: No follow-ups on file.   Gildardo Pounds, FNP-BC [facility] and [facility] [place], [place]   09/04/2018, 3:46 PM

## 2018-09-30 ENCOUNTER — Emergency Department
Admission: EM | Admit: 2018-09-30 | Discharge: 2018-09-30 | Disposition: A | Discharge status: Home / Self Care | Payer: Self-pay | Attending: Emergency Medicine | Admitting: Emergency Medicine

## 2018-09-30 ENCOUNTER — Emergency Department: Payer: Self-pay

## 2018-09-30 ENCOUNTER — Other Ambulatory Visit: Payer: Self-pay

## 2018-09-30 DIAGNOSIS — Z79899 Other long term (current) drug therapy: Secondary | ICD-10-CM | POA: Insufficient documentation

## 2018-09-30 DIAGNOSIS — F17210 Nicotine dependence, cigarettes, uncomplicated: Secondary | ICD-10-CM | POA: Insufficient documentation

## 2018-09-30 DIAGNOSIS — B349 Viral infection, unspecified: Secondary | ICD-10-CM | POA: Insufficient documentation

## 2018-09-30 DIAGNOSIS — I10 Essential (primary) hypertension: Secondary | ICD-10-CM | POA: Insufficient documentation

## 2018-09-30 DIAGNOSIS — E119 Type 2 diabetes mellitus without complications: Secondary | ICD-10-CM | POA: Insufficient documentation

## 2018-09-30 DIAGNOSIS — Z7984 Long term (current) use of oral hypoglycemic drugs: Secondary | ICD-10-CM | POA: Insufficient documentation

## 2018-09-30 DIAGNOSIS — E1140 Type 2 diabetes mellitus with diabetic neuropathy, unspecified: Secondary | ICD-10-CM | POA: Insufficient documentation

## 2018-09-30 NOTE — Discharge Instructions (Addendum)
Please be sure to quarantine yourself, we are concerned you may have the novel coronavirus

## 2018-09-30 NOTE — ED Notes (Signed)
To waiting room via EMS from home.  Per EMS patient with complaint of body aches and runny nose today.  Patient was afebrile for EMS  BP 159/103, HR 100, Temp 98.5, RR 18

## 2018-09-30 NOTE — ED Provider Notes (Signed)
[facility] Emergency Department Provider Note   ____________________________________________    I have reviewed the triage vital signs and the nursing notes.   HISTORY  Chief Complaint Generalized Body Aches     HPI [name] is a 58 y.o. male who presents with complaints of chills, body aches which started this morning.  He describes weakness as well, occasional cough.  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  No known exposure to a COVID-19 patient.  Does have a history of diabetes.  Past Medical History:  Diagnosis Date  . Blood clot in vein   . Diabetes mellitus   . DVT (deep venous thrombosis) ([place])   . Hypertension     Patient Active Problem List   Diagnosis Date Noted  . Neuropathic pain of ankle, left 01/25/2017  . Adjustment disorder with mixed anxiety and depressed mood 08/27/2016  . Hypertriglyceridemia 06/30/2016  . Thrombocytopenia, unspecified ([place]) 12/06/2012  . Diabetic neuropathy ([place]) 12/05/2012  . Tobacco use disorder 12/05/2012  . Dyslipidemia 12/05/2012  . Uncontrolled type 2 diabetes mellitus with ketoacidosis without coma, without long-term current use of insulin ([place]) 05/11/2011  . Essential hypertension 05/11/2011    Past Surgical History:  Procedure Laterality Date  . APPENDECTOMY    . VEIN LIGATION AND STRIPPING     of left leg    Prior to Admission medications   Medication Sig Start Date End Date Taking? Authorizing Provider  acetaminophen (TYLENOL) 325 MG tablet Take 650 mg by mouth every 6 (six) hours as needed.    [provider]  albuterol (PROVENTIL HFA;VENTOLIN HFA) 108 (90 Base) MCG/ACT inhaler Inhale 2 puffs into the lungs every 6 (six) hours as needed for wheezing or shortness of breath. 04/18/17   Johnn Hai, PA-C  Blood Glucose Monitoring Suppl (TRUE METRIX METER) w/Device KIT 1 each by Does not apply route 3 (three) times daily. Patient not taking: Reported on 09/04/2018  01/25/17   Tresa Garter, MD  buPROPion (WELLBUTRIN) 100 MG tablet Take 1 tablet (100 mg total) by mouth 2 (two) times daily. 06/21/17   Tresa Garter, MD  busPIRone (BUSPAR) 10 MG tablet Take 1 tablet (10 mg total) by mouth 3 (three) times daily. 07/16/18   Charlott Rakes, MD  citalopram (CELEXA) 10 MG tablet Take 1 tablet (10 mg total) by mouth daily. Patient not taking: Reported on 09/04/2018 06/21/17   Tresa Garter, MD  cyclobenzaprine (FLEXERIL) 10 MG tablet Take 1 tablet (10 mg total) by mouth 3 (three) times daily as needed for muscle spasms. Patient not taking: Reported on 09/04/2018 11/08/17   Cuthriell, Charline Bills, PA-C  gabapentin (NEURONTIN) 100 MG capsule Take 1 capsule (100 mg total) by mouth 3 (three) times daily. 09/20/17 09/20/18  Loney Hering, MD  gabapentin (NEURONTIN) 100 MG capsule Take 1 capsule (100 mg total) by mouth 3 (three) times daily. Patient not taking: Reported on 09/04/2018 07/16/18   Charlott Rakes, MD  gemfibrozil (LOPID) 600 MG tablet Take 1 tablet (600 mg total) by mouth 2 (two) times daily before a meal. 06/21/17   Jegede, Olugbemiga E, MD  glimepiride (AMARYL) 4 MG tablet Take 1 tablet (4 mg total) by mouth daily with breakfast for 30 days. 09/04/18 10/04/18  Gildardo Pounds, NP  glucose blood (TRUE METRIX BLOOD GLUCOSE TEST) test strip Use as instructed Patient not taking: Reported on 09/04/2018 01/31/17   Tresa Garter, MD  metFORMIN (GLUCOPHAGE) 1000 MG tablet Take  1 tablet (1,000 mg total) by mouth 2 (two) times daily with a meal for 30 days. 09/04/18 10/04/18  Gildardo Pounds, NP  metoprolol tartrate (LOPRESSOR) 50 MG tablet Take 1 tablet (50 mg total) by mouth 2 (two) times daily. 09/04/18   Gildardo Pounds, NP  Syringe, Disposable, (30-35CC SYRINGE) 35 ML MISC Use for injecting insulin Patient not taking: Reported on 09/04/2018 01/25/17   Tresa Garter, MD  TRUEPLUS LANCETS 28G MISC Check blood sugar tid Patient not taking: Reported on  09/04/2018 01/25/17   Tresa Garter, MD     Allergies Erythromycin base; Benzodiazepines; Xanax [alprazolam]; Methocarbamol; and Sulfa antibiotics  Family History  Problem Relation Age of Onset  . Heart disease Mother   . Diabetes Father   . Heart disease Father     Social History Social History   Tobacco Use  . Smoking status: Current Every Day Smoker    Packs/day: 1.00    Types: Cigarettes  . Smokeless tobacco: Never Used  Substance Use Topics  . Alcohol use: No  . Drug use: No    Review of Systems  Constitutional: As above Eyes: No visual changes.  ENT: No sore throat. Cardiovascular: Denies chest pain. Respiratory: No shortness of breath, occasional cough Gastrointestinal: No abdominal pain.  No nausea, no vomiting.   Genitourinary: Negative for dysuria. Musculoskeletal: Myalgias Skin: Negative for rash. Neurological: Negative for headaches or weakness   ____________________________________________   PHYSICAL EXAM:  VITAL SIGNS: ED Triage Vitals [09/30/18 2003]  Enc Vitals Group     BP (!) 134/101     Pulse Rate (!) 113     Resp 18     Temp 98.6 F (37 C)     Temp Source Oral     SpO2 97 %     Weight 88.5 kg (195 lb)     Height 1.803 m ('5\' 11"' )     Head Circumference      Peak Flow      Pain Score 8     Pain Loc      Pain Edu?      Excl. in [place]?     Constitutional: Alert and oriented. N  Nose: No congestion/rhinnorhea. Mouth/Throat: Mucous membranes are moist.    Cardiovascular: Mild tachycardia, regular rhythm. Grossly normal heart sounds.  Good peripheral circulation. Respiratory: Normal respiratory effort.  No retractions Gastrointestinal: Soft and nontender. No distention.    Musculoskeletal:  Warm and well perfused Neurologic:  Normal speech and language. No gross focal neurologic deficits are appreciated.  Skin:  Skin is warm, dry and intact. No rash noted. Psychiatric: Mood and affect are normal. Speech and behavior are  normal.  ____________________________________________   LABS (all labs ordered are listed, but only abnormal results are displayed)  Labs Reviewed - No data to display ____________________________________________  EKG  None ____________________________________________  RADIOLOGY  Chest x-ray ____________________________________________   PROCEDURES  Procedure(s) performed: No  Procedures   Critical Care performed: No ____________________________________________   INITIAL IMPRESSION / ASSESSMENT AND PLAN / ED COURSE  Pertinent labs & imaging results that were available during my care of the patient were reviewed by me and considered in my medical decision making (see chart for details).  Patient overall well-appearing and in no acute distress, mildly tachycardic.  Differential includes viral illness such as flu but also strong possibility of COVID-19, or bacterial pneumonia.  Pending chest x-ray.  Chest x-ray unremarkable, patient appropriate for discharge, recommend self quarantine return to work until  3 days fever free improving symptoms      [name] was evaluated in Emergency Department on 09/30/2018 for the symptoms described in the history of present illness. He was evaluated in the context of the global COVID-19 pandemic, which necessitated consideration that the patient might be at risk for infection with the SARS-CoV-2 virus that causes COVID-19. Institutional protocols and algorithms that pertain to the evaluation of patients at risk for COVID-19 are in a state of rapid change based on information released by regulatory bodies including the CDC and federal and state organizations. These policies and algorithms were followed during the patient's care in the ED.     ____________________________________________   FINAL CLINICAL IMPRESSION(S) / ED DIAGNOSES  Final diagnoses:  None        Note:  This document was prepared using Dragon voice  recognition software and may include unintentional dictation errors.   Lavonia Drafts, MD 09/30/18 2200

## 2018-09-30 NOTE — ED Triage Notes (Signed)
Patient reports generalized body aches and runny nose today.

## 2018-09-30 NOTE — ED Notes (Signed)
Pt unable to sign due to signature pad malfnx. Pt verbalizes d/c understanding and follow up with covid discharge instructions to self quarantine.

## 2018-10-23 ENCOUNTER — Ambulatory Visit: Payer: Self-pay | Attending: Family Medicine | Admitting: Family Medicine

## 2018-10-23 ENCOUNTER — Encounter: Payer: Self-pay | Admitting: Family Medicine

## 2018-10-23 ENCOUNTER — Other Ambulatory Visit: Payer: Self-pay

## 2018-10-23 DIAGNOSIS — F4323 Adjustment disorder with mixed anxiety and depressed mood: Secondary | ICD-10-CM

## 2018-10-23 DIAGNOSIS — F17200 Nicotine dependence, unspecified, uncomplicated: Secondary | ICD-10-CM

## 2018-10-23 DIAGNOSIS — I10 Essential (primary) hypertension: Secondary | ICD-10-CM

## 2018-10-23 DIAGNOSIS — E1149 Type 2 diabetes mellitus with other diabetic neurological complication: Secondary | ICD-10-CM

## 2018-10-23 DIAGNOSIS — I739 Peripheral vascular disease, unspecified: Secondary | ICD-10-CM

## 2018-10-23 DIAGNOSIS — F17210 Nicotine dependence, cigarettes, uncomplicated: Secondary | ICD-10-CM

## 2018-10-23 MED ORDER — ATORVASTATIN CALCIUM 40 MG PO TABS: 40.0000 mg | ORAL_TABLET | Freq: Every day | ORAL | 3 refills | Status: DC

## 2018-10-23 MED ORDER — ASPIRIN EC 81 MG PO TBEC: 81.0000 mg | DELAYED_RELEASE_TABLET | Freq: Every day | ORAL | 3 refills | Status: DC

## 2018-10-23 MED ORDER — BUPROPION HCL 100 MG PO TABS: 100.0000 mg | ORAL_TABLET | Freq: Two times a day (BID) | ORAL | 3 refills | Status: DC

## 2018-10-23 MED ORDER — BUSPIRONE HCL 10 MG PO TABS: 10.0000 mg | ORAL_TABLET | Freq: Three times a day (TID) | ORAL | 3 refills | Status: DC

## 2018-10-23 NOTE — Progress Notes (Signed)
Patient has been called and DOB has been verified. Patient has been screened and transferred to PCP to start phone visit.  C/C: Diabetes   

## 2018-10-23 NOTE — Progress Notes (Signed)
Virtual Visit via Telephone Note  I connected with [name], on 10/23/2018 at 1:36pm by telephone and verified that I am speaking with the correct person using two identifiers.   Consent: I discussed the limitations, risks, security and privacy concerns of performing an evaluation and management service by telephone and the availability of in person appointments. I also discussed with the patient that there may be a patient responsible charge related to this service. The patient expressed understanding and agreed to proceed.   Location of Patient: Production [place], theatre/television/film of Provider: Clinic   Persons participating in Telemedicine visit: Kishan Wachsmuth Farrington-CMA Dr. Felecia Shelling   History of Present Illness: [name] is a 58 year old male with a history of type 2 diabetes mellitus (A1c 8.1), hypertension, anxiety and depression, diabetic neuropathy,previous DVT,  peripheral vascular disease who presents today for follow-up visit. He has pain in both legs, left greater than right which improves when he takes an aspirin and worse when he walks. He does not take gabapentin which was prescribed for diabetic neuropathy because it caused him to have jerking. Review of his chart indicates a previous history of peripheral vascular disease.  He currently smokes 1 pack of cigarettes per day. He fell 6 days ago and hit the left side of his chest wall but has been taking some naproxen with some improvement but pain returns when the naproxen wears off.  He is at [facility] and is unable to tell me his blood sugars.  Compliant with BuSpar which he uses for anxiety.   Past Medical History:  Diagnosis Date  . Blood clot in vein   . Diabetes mellitus   . DVT (deep venous thrombosis) ([place])   . Hypertension    Allergies  Allergen Reactions  . Erythromycin Base Anxiety  . Benzodiazepines     PT STATES HE CANNOT TAKE THESE BECAUSE THEY ALMOST KILLED HIM  . Xanax [Alprazolam]   .  Methocarbamol Anxiety and Other (See Comments)    Stomach cramping, weakness  . Sulfa Antibiotics Nausea And Vomiting and Other (See Comments)    Cramping in stomach and hyperventilate.     Current Outpatient Medications on File Prior to Visit  Medication Sig Dispense Refill  . acetaminophen (TYLENOL) 325 MG tablet Take 650 mg by mouth every 6 (six) hours as needed.    Marland Kitchen gemfibrozil (LOPID) 600 MG tablet Take 1 tablet (600 mg total) by mouth 2 (two) times daily before a meal. 180 tablet 3  . metoprolol tartrate (LOPRESSOR) 50 MG tablet Take 1 tablet (50 mg total) by mouth 2 (two) times daily. 60 tablet 3  . albuterol (PROVENTIL HFA;VENTOLIN HFA) 108 (90 Base) MCG/ACT inhaler Inhale 2 puffs into the lungs every 6 (six) hours as needed for wheezing or shortness of breath. (Patient not taking: Reported on 10/23/2018) 1 Inhaler 2  . Blood Glucose Monitoring Suppl (TRUE METRIX METER) w/Device KIT 1 each by Does not apply route 3 (three) times daily. (Patient not taking: Reported on 09/04/2018) 1 kit 0  . citalopram (CELEXA) 10 MG tablet Take 1 tablet (10 mg total) by mouth daily. (Patient not taking: Reported on 09/04/2018) 30 tablet 0  . glimepiride (AMARYL) 4 MG tablet Take 1 tablet (4 mg total) by mouth daily with breakfast for 30 days. 30 tablet 3  . glucose blood (TRUE METRIX BLOOD GLUCOSE TEST) test strip Use as instructed (Patient not taking: Reported on 09/04/2018) 100 each 12  . metFORMIN (GLUCOPHAGE) 1000 MG tablet  Take 1 tablet (1,000 mg total) by mouth 2 (two) times daily with a meal for 30 days. 60 tablet 3  . Syringe, Disposable, (30-35CC SYRINGE) 35 ML MISC Use for injecting insulin (Patient not taking: Reported on 09/04/2018) 100 each 1  . TRUEPLUS LANCETS 28G MISC Check blood sugar tid (Patient not taking: Reported on 09/04/2018) 100 each 2   No current facility-administered medications on file prior to visit.     Observations/Objective: Alert, awake, oriented x3 Not in acute  distress  CMP Latest Ref Rng & Units 07/29/2018 09/28/2017 09/19/2017  Glucose 70 - 99 mg/dL 199(H) 207(H) 137(H)  BUN 6 - 20 mg/dL _0 Creatinine 0.61 - 1.24 mg/dL 0.83 0.84 0.68  Sodium 135 - 145 mmol/L 131(L) 129(L) 141  Potassium 3.5 - 5.1 mmol/L 4.1 4.1 4.6  Chloride 98 - 111 mmol/L 98 93(L) 104  CO2 22 - 32 mmol/L _1 Calcium 8.9 - 10.3 mg/dL 8.9 8.9 9.8  Total Protein 6.5 - 8.1 g/dL - 7.6 -  Total Bilirubin 0.3 - 1.2 mg/dL - 1.0 -  Alkaline Phos 38 - 126 U/L - 61 -  AST 15 - 41 U/L - 16 -  ALT 17 - 63 U/L - 13(L) -    Lipid Panel     Component Value Date/Time   CHOL 181 12/05/2012 1317   TRIG 351 (H) 12/05/2012 1317   HDL 29 (L) 12/05/2012 1317   CHOLHDL 6.2 12/05/2012 1317   VLDL 70 (H) 12/05/2012 1317   LDLCALC 82 12/05/2012 1317    The 10-year ASCVD risk score Mikey Bussing DC Jr., et al., 2013) is: 36.3%   Values used to calculate the score:     Age: 13 years     Sex: Male     Is Non-Hispanic African American: No     Diabetic: Yes     Tobacco smoker: Yes     Systolic Blood Pressure: 101 mmHg     Is BP treated: Yes     HDL Cholesterol: 30.8 mg/dl     Total Cholesterol: 181 mg/dl  Assessment and Plan: 1. Adjustment disorder with mixed anxiety and depressed mood Stable - busPIRone (BUSPAR) 10 MG tablet; Take 1 tablet (10 mg total) by mouth 3 (three) times daily.  Dispense: 90 tablet; Refill: 3 - buPROPion (WELLBUTRIN) 100 MG tablet; Take 1 tablet (100 mg total) by mouth 2 (two) times daily.  Dispense: 60 tablet; Refill: 3  2. Tobacco use disorder Currently smokes 1 pack of cigarette per day Spent 3 minutes counseling on cessation - buPROPion (WELLBUTRIN) 100 MG tablet; Take 1 tablet (100 mg total) by mouth 2 (two) times daily.  Dispense: 60 tablet; Refill: 3  3. Essential hypertension Advised to keep a check of blood pressure at home Continue antihypertensive Counseled on blood pressure goal of less than 130/80, low-sodium, DASH diet, medication  compliance, 150 minutes of moderate intensity exercise per week. Discussed medication compliance, adverse effects.   4. Type 2 diabetes mellitus with other neurologic complication, without long-term current use of insulin (HCC) Uncontrolled with A1c of 8.1 We will check A1c at next visit and adjust regimen accordingly Continue diabetic diet, lifestyle modifications  5. Peripheral vascular disease ([place])  He needs to work on smoking cessation which is his major risk factor We will evaluated his next in person visit for possible ABI PVR and referral to vascular surgeon Will initiate statin due to cardiovascular risk. - aspirin EC 81 MG tablet; Take 1  tablet (81 mg total) by mouth daily.  Dispense: 30 tablet; Refill: 3   Follow Up Instructions: 3 Months   I discussed the assessment and treatment plan with the patient. The patient was provided an opportunity to ask questions and all were answered. The patient agreed with the plan and demonstrated an understanding of the instructions.   The patient was advised to call back or seek an in-person evaluation if the symptoms worsen or if the condition fails to improve as anticipated.     I provided 25 minutes total of non-face-to-face time during this encounter including median intraservice time, reviewing previous notes, labs, imaging, medications and explaining diagnosis and management.  Spent 3 minutes counseling on smoking cessation and hazardous effect of smoking and he is not ready to quit at this time.     Charlott Rakes, MD, FAAFP. [facility] and [facility], [place]   10/23/2018, 1:56 PM

## 2018-11-30 ENCOUNTER — Emergency Department: Payer: Self-pay

## 2018-11-30 ENCOUNTER — Other Ambulatory Visit: Payer: Self-pay

## 2018-11-30 ENCOUNTER — Emergency Department
Admission: EM | Admit: 2018-11-30 | Discharge: 2018-11-30 | Disposition: A | Discharge status: Home / Self Care | Payer: Self-pay | Attending: Emergency Medicine | Admitting: Emergency Medicine

## 2018-11-30 DIAGNOSIS — I10 Essential (primary) hypertension: Secondary | ICD-10-CM | POA: Insufficient documentation

## 2018-11-30 DIAGNOSIS — F17210 Nicotine dependence, cigarettes, uncomplicated: Secondary | ICD-10-CM | POA: Insufficient documentation

## 2018-11-30 DIAGNOSIS — M5412 Radiculopathy, cervical region: Secondary | ICD-10-CM | POA: Insufficient documentation

## 2018-11-30 DIAGNOSIS — Z79899 Other long term (current) drug therapy: Secondary | ICD-10-CM | POA: Insufficient documentation

## 2018-11-30 MED ORDER — PREDNISONE 10 MG PO TABS: 10.0000 mg | ORAL_TABLET | Freq: Every day | ORAL | 0 refills | Status: DC

## 2018-11-30 MED ORDER — HYDROCODONE-ACETAMINOPHEN 5-325 MG PO TABS: 1.0000 | ORAL_TABLET | ORAL | 0 refills | Status: DC | PRN

## 2018-11-30 MED ORDER — SODIUM CHLORIDE 0.9% FLUSH: 3.0000 mL | Freq: Once | INTRAVENOUS | Status: DC

## 2018-11-30 MED ORDER — HYDROCODONE-ACETAMINOPHEN 5-325 MG PO TABS
1.0000 | ORAL_TABLET | ORAL | Status: AC
  Administered 2018-11-30: 1 via ORAL
  Filled 2018-11-30: qty 1

## 2018-11-30 NOTE — ED Notes (Addendum)
Right shoulder pain radiating down right arm X "weeks" weakness to right arm and difficulty gripping things due to pain.  Possible heavy lifting that began pain.

## 2018-11-30 NOTE — ED Triage Notes (Signed)
Patient c/o neck pain and right shoulder pain radiating down arm X 1 month with gradually increasing severity.

## 2018-11-30 NOTE — ED Triage Notes (Addendum)
Error in charting.

## 2018-11-30 NOTE — ED Notes (Signed)
Charting before 1929 done in error. See updated charting

## 2018-11-30 NOTE — ED Provider Notes (Signed)
[place] EMERGENCY DEPARTMENT Provider Note   CSN: [id number] Arrival date & time: 11/30/18  1906    History   Chief Complaint Chief Complaint  Patient presents with   Neck Pain   Shoulder Pain    HPI [name] is a 58 y.o. male.  Presents to the emergency department evaluation of right-sided neck pain and shoulder pain.  1 month ago he states he fell out of the bed because some pain into his neck and ever since he has had pain numbness and tingling radiating along the right biceps, radial aspect forearm and into the thumb and index finger of the right hand.  He has not been taking medications for symptoms.  He feels some weakness in the right arm with lifting the arm into abduction as well as with gripping things.  He denies any limitations in neck range of motion but does have some pain of the right side of his neck.  No headache or LOC.  No nausea or vomiting.  He denies any back pain or hip pain.    HPI  Past Medical History:  Diagnosis Date   Blood clot in vein    Diabetes mellitus    DVT (deep venous thrombosis) ([place])    Hypertension     Patient Active Problem List   Diagnosis Date Noted   Peripheral vascular disease ([place]) 10/23/2018   Neuropathic pain of ankle, left 01/25/2017   Adjustment disorder with mixed anxiety and depressed mood 08/27/2016   Hypertriglyceridemia 06/30/2016   Thrombocytopenia, unspecified ([place]) 12/06/2012   Diabetic neuropathy ([place]) 12/05/2012   Tobacco use disorder 12/05/2012   Dyslipidemia 12/05/2012   Type 2 diabetes mellitus ([place]) 05/11/2011   Essential hypertension 05/11/2011    Past Surgical History:  Procedure Laterality Date   APPENDECTOMY     VEIN LIGATION AND STRIPPING     of left leg        Home Medications    Prior to Admission medications   Medication Sig Start Date End Date Taking? Authorizing Provider  acetaminophen (TYLENOL) 325 MG tablet Take 650 mg by mouth every 6  (six) hours as needed.    [provider]  albuterol (PROVENTIL HFA;VENTOLIN HFA) 108 (90 Base) MCG/ACT inhaler Inhale 2 puffs into the lungs every 6 (six) hours as needed for wheezing or shortness of breath. Patient not taking: Reported on 10/23/2018 04/18/17   Johnn Hai, PA-C  aspirin EC 81 MG tablet Take 1 tablet (81 mg total) by mouth daily. 10/23/18   Charlott Rakes, MD  atorvastatin (LIPITOR) 40 MG tablet Take 1 tablet (40 mg total) by mouth daily. 10/23/18   Charlott Rakes, MD  Blood Glucose Monitoring Suppl (TRUE METRIX METER) w/Device KIT 1 each by Does not apply route 3 (three) times daily. Patient not taking: Reported on 09/04/2018 01/25/17   Tresa Garter, MD  buPROPion (WELLBUTRIN) 100 MG tablet Take 1 tablet (100 mg total) by mouth 2 (two) times daily. 10/23/18   Charlott Rakes, MD  busPIRone (BUSPAR) 10 MG tablet Take 1 tablet (10 mg total) by mouth 3 (three) times daily. 10/23/18   Charlott Rakes, MD  citalopram (CELEXA) 10 MG tablet Take 1 tablet (10 mg total) by mouth daily. Patient not taking: Reported on 09/04/2018 06/21/17   Tresa Garter, MD  glimepiride (AMARYL) 4 MG tablet Take 1 tablet (4 mg total) by mouth daily with breakfast for 30 days. 09/04/18 10/04/18  Gildardo Pounds, NP  glucose blood (TRUE  METRIX BLOOD GLUCOSE TEST) test strip Use as instructed Patient not taking: Reported on 09/04/2018 01/31/17   Tresa Garter, MD  HYDROcodone-acetaminophen (NORCO) 5-325 MG tablet Take 1 tablet by mouth every 4 (four) hours as needed for moderate pain. 11/30/18   Duanne Guess, PA-C  metFORMIN (GLUCOPHAGE) 1000 MG tablet Take 1 tablet (1,000 mg total) by mouth 2 (two) times daily with a meal for 30 days. 09/04/18 10/04/18  Gildardo Pounds, NP  metoprolol tartrate (LOPRESSOR) 50 MG tablet Take 1 tablet (50 mg total) by mouth 2 (two) times daily. 09/04/18   Gildardo Pounds, NP  predniSONE (DELTASONE) 10 MG tablet Take 1 tablet (10 mg total) by mouth daily.  6,5,4,3,2,1 six day taper 11/30/18   Duanne Guess, PA-C  Syringe, Disposable, (30-35CC SYRINGE) 35 ML MISC Use for injecting insulin Patient not taking: Reported on 09/04/2018 01/25/17   Tresa Garter, MD  TRUEPLUS LANCETS 28G MISC Check blood sugar tid Patient not taking: Reported on 09/04/2018 01/25/17   Tresa Garter, MD    Family History Family History  Problem Relation Age of Onset   Heart disease Mother    Diabetes Father    Heart disease Father     Social History Social History   Tobacco Use   Smoking status: Current Every Day Smoker    Packs/day: 1.00    Types: Cigarettes   Smokeless tobacco: Never Used  Substance Use Topics   Alcohol use: No   Drug use: No     Allergies   Erythromycin base; Benzodiazepines; Xanax [alprazolam]; Methocarbamol; and Sulfa antibiotics   Review of Systems Review of Systems  Constitutional: Negative for fever.  Musculoskeletal: Positive for neck pain. Negative for arthralgias, back pain, gait problem and joint swelling.  Skin: Negative for rash and wound.  Neurological: Positive for numbness. Negative for light-headedness and headaches.     Physical Exam Updated Vital Signs BP (!) 147/95    Pulse 75    Temp 98.6 F (37 C)    Resp 18    Ht '5\' 11"'  (1.803 m)    Wt 88.5 kg    SpO2 96%    BMI 27.21 kg/m   Physical Exam Constitutional:      Appearance: He is well-developed.  HENT:     Head: Normocephalic and atraumatic.  Eyes:     Conjunctiva/sclera: Conjunctivae normal.  Neck:     Musculoskeletal: Normal range of motion.  Cardiovascular:     Rate and Rhythm: Normal rate.  Pulmonary:     Effort: Pulmonary effort is normal. No respiratory distress.     Breath sounds: Normal breath sounds.  Musculoskeletal:     Comments: Cervical Spine: Examination of the cervical spine reveals no bony abnormality, no edema, and no ecchymosis.  There is no step-off.  The patient has full active and passive range of motion of  the cervical spine with flexion, extension, and right and left bend with rotation.  There is no crepitus with range of motion exercises.  Positive tenderness along spinous process.  Positive right-sided paravertebral muscle tenderness on cervical spine.  The patient has a positive Spurling test.   Right upper Extremity: Examination of the right shoulder and arm showed no bony abnormality or edema.  The patient has normal active and passive motion with abduction, flexion, internal rotation, and external rotation.  The patient has no tenderness with motion.  The patient has a negative Hawkins test and a negative impingement test.  The patient  has a negative drop arm test.  The patient is non-tender along the deltoid muscle.  There is no subacromial space tenderness with no AC joint tenderness.  The patient has no instability of the shoulder with anterior-posterior motion.  There is a negative sulcus sign.  The rotator cuff muscle strength is 5/5 with supraspinatus, 5/5 with internal rotation, and 5/5 with external rotation.  There is no crepitus with range of motion activities.      Skin:    General: Skin is warm.     Findings: No rash.  Neurological:     Mental Status: He is alert and oriented to person, place, and time.  Psychiatric:        Behavior: Behavior normal.        Thought Content: Thought content normal.      ED Treatments / Results  Labs (all labs ordered are listed, but only abnormal results are displayed) Labs Reviewed - No data to display  EKG None  Radiology Dg Cervical Spine 2-3 Views  Result Date: 11/30/2018 CLINICAL DATA:  Neck pain after fall 1 month prior. EXAM: CERVICAL SPINE - 2-3 VIEW COMPARISON:  None. FINDINGS: On the lateral view the cervical spine is visualized to the level of C6-7 with improved visualization of the C7-T1 level on swimmer's view. There is a normal cervical lordosis. Pre-vertebral soft tissues are within normal limits. No fracture is detected in  the cervical spine. Dens is well positioned between the lateral masses of C1. Mild multilevel cervical degenerative disc disease, most prominent at C6-7. No subluxation. Minimal bilateral facet arthropathy. No aggressive-appearing focal osseous lesions. Focal ossification of the nuchal ligament in posterior neck soft tissues at the C5 level. IMPRESSION: 1. No cervical spine fracture or subluxation. 2. Mild multilevel degenerative disc disease, most prominent at C6-7. Electronically Signed   By: Ilona Sorrel M.D.   On: 11/30/2018 21:49    Procedures Procedures (including critical care time)  Medications Ordered in ED Medications  HYDROcodone-acetaminophen (NORCO/VICODIN) 5-325 MG per tablet 1 tablet (1 tablet Oral Given 11/30/18 2140)     Initial Impression / Assessment and Plan / ED Course  I have reviewed the triage vital signs and the nursing notes.  Pertinent labs & imaging results that were available during my care of the patient were reviewed by me and considered in my medical decision making (see chart for details).        58 year old male with right-sided cervical radiculopathy.  X-rays show no evidence of acute bony abnormality.  Mild degenerative disc changes and spurring noted along the cervical spine.  He has no neurological deficits.  Patient given prescription for 6-day prednisone taper and Norco for pain.  He will follow-up orthopedics if no improvement 1 week.  Final Clinical Impressions(s) / ED Diagnoses   Final diagnoses:  Right cervical radiculopathy    ED Discharge Orders         Ordered    HYDROcodone-acetaminophen (NORCO) 5-325 MG tablet  Every 4 hours PRN     11/30/18 2158    predniSONE (DELTASONE) 10 MG tablet  Daily     11/30/18 2158           Renata Caprice 11/30/18 2200    Nance Pear, MD 11/30/18 2240

## 2018-11-30 NOTE — Discharge Instructions (Addendum)
Please take medications as prescribed.  If any increasing pain or weakness return to the emergency department.  Follow-up with orthopedics in 1 week if no improvement.

## 2018-12-10 ENCOUNTER — Other Ambulatory Visit: Payer: Self-pay

## 2018-12-10 ENCOUNTER — Emergency Department
Admission: EM | Admit: 2018-12-10 | Discharge: 2018-12-10 | Disposition: A | Discharge status: Home / Self Care | Payer: Self-pay | Attending: Emergency Medicine | Admitting: Emergency Medicine

## 2018-12-10 DIAGNOSIS — E1140 Type 2 diabetes mellitus with diabetic neuropathy, unspecified: Secondary | ICD-10-CM | POA: Insufficient documentation

## 2018-12-10 DIAGNOSIS — F17210 Nicotine dependence, cigarettes, uncomplicated: Secondary | ICD-10-CM | POA: Insufficient documentation

## 2018-12-10 DIAGNOSIS — Z7982 Long term (current) use of aspirin: Secondary | ICD-10-CM | POA: Insufficient documentation

## 2018-12-10 DIAGNOSIS — E119 Type 2 diabetes mellitus without complications: Secondary | ICD-10-CM | POA: Insufficient documentation

## 2018-12-10 DIAGNOSIS — M5412 Radiculopathy, cervical region: Secondary | ICD-10-CM | POA: Insufficient documentation

## 2018-12-10 DIAGNOSIS — I10 Essential (primary) hypertension: Secondary | ICD-10-CM | POA: Insufficient documentation

## 2018-12-10 DIAGNOSIS — M542 Cervicalgia: Secondary | ICD-10-CM | POA: Insufficient documentation

## 2018-12-10 DIAGNOSIS — G8929 Other chronic pain: Secondary | ICD-10-CM | POA: Insufficient documentation

## 2018-12-10 DIAGNOSIS — Z79899 Other long term (current) drug therapy: Secondary | ICD-10-CM | POA: Insufficient documentation

## 2018-12-10 DIAGNOSIS — Z7984 Long term (current) use of oral hypoglycemic drugs: Secondary | ICD-10-CM | POA: Insufficient documentation

## 2018-12-10 DIAGNOSIS — M25511 Pain in right shoulder: Secondary | ICD-10-CM | POA: Insufficient documentation

## 2018-12-10 MED ORDER — LIDOCAINE 5 % EX PTCH: 1.0000 | MEDICATED_PATCH | Freq: Two times a day (BID) | CUTANEOUS | 0 refills | Status: DC

## 2018-12-10 MED ORDER — LIDOCAINE 5 % EX PTCH
1.0000 | MEDICATED_PATCH | CUTANEOUS | Status: DC
  Administered 2018-12-10: 1 via TRANSDERMAL
  Filled 2018-12-10: qty 1

## 2018-12-10 MED ORDER — KETOROLAC TROMETHAMINE 30 MG/ML IJ SOLN
15.0000 mg | Freq: Once | INTRAMUSCULAR | Status: DC
  Filled 2018-12-10: qty 1

## 2018-12-10 NOTE — ED Notes (Signed)
In with Dr Karma Greaser to see pt

## 2018-12-10 NOTE — ED Triage Notes (Signed)
Pt states has had "months" of right shoulder pain radiating up right lateral neck and down to right hand. Pt states "I need to figure out what's going on". Pt states he was seen here 4 days ago for same, "but it's not getting better". Pt appears in no acute distress.

## 2018-12-10 NOTE — Discharge Instructions (Signed)
As we discussed, I recommend you take the medications previously prescribed and prescribed tonight for your chronic neck and right shoulder pain.  The emergency department does not have anything else to offer at this time.  I recommend that you follow-up with Dr. Sharlet Salina and/or with orthopedic surgery at the names and numbers listed in this paperwork.  Please read through the instructions for additional care options that may be helpful to you.  Use over-the-counter ibuprofen and/or Tylenol according to label instructions and continue using the previously prescribed medications as instructed on the labels.

## 2018-12-10 NOTE — ED Provider Notes (Signed)
[facility] Emergency Department Provider Note  ____________________________________________   First MD Initiated Contact with Patient 12/10/18 0533     (approximate)  I have reviewed the triage vital signs and the nursing notes.   HISTORY  Chief Complaint Neck Pain and Shoulder Pain    HPI [name] is a 58 y.o. male with medical history as listed below which notably includes chronic neck pain and chronic shoulder pain as well as cervical radiculopathy.  He presents tonight for evaluation of ongoing neck and right shoulder pain.  He says that it is been going on for months but he cannot take it anymore and he needs to know what is wrong.  He was seen in this emergency department 4 days ago by Mr. Arvella Nigh (who also works an Secondary school teacher) and had a thorough evaluation and was prescribed Norco and a prednisone taper.  The patient opted not to fill the prednisone taper but did fill the Norco.  He states he is concerned about his blood sugar if he takes the prednisone.  He explains that he is homeless and needs somebody to look after him and his blood sugars if he is going to be taking the medicine.  He said that the person he has been staying with was not home tonight so he has been walking around and sleeping in the park and just could not take the pain anymore so he came to the emergency department.  Of note, the nurse and triage observe the patient ambulating around the waiting room without difficulty, using his hands and arms to use hand sanitizer, and appeared to be in no distress, but when he was called to the desk to be brought back to a room he immediately began wincing in pain and supporting his right arm with his left.  The patient reports that his pain is severe and nothing is helping and any amount of movement makes it worse.  He occasionally has numbness and tingling in the right hand and arm.         Past Medical History:  Diagnosis Date  . Blood  clot in vein   . Diabetes mellitus   . DVT (deep venous thrombosis) ([place])   . Hypertension     Patient Active Problem List   Diagnosis Date Noted  . Peripheral vascular disease ([place]) 10/23/2018  . Neuropathic pain of ankle, left 01/25/2017  . Adjustment disorder with mixed anxiety and depressed mood 08/27/2016  . Hypertriglyceridemia 06/30/2016  . Thrombocytopenia, unspecified ([place]) 12/06/2012  . Diabetic neuropathy ([place]) 12/05/2012  . Tobacco use disorder 12/05/2012  . Dyslipidemia 12/05/2012  . Type 2 diabetes mellitus ([place]) 05/11/2011  . Essential hypertension 05/11/2011    Past Surgical History:  Procedure Laterality Date  . APPENDECTOMY    . VEIN LIGATION AND STRIPPING     of left leg    Prior to Admission medications   Medication Sig Start Date End Date Taking? Authorizing Provider  acetaminophen (TYLENOL) 325 MG tablet Take 650 mg by mouth every 6 (six) hours as needed.    [provider]  albuterol (PROVENTIL HFA;VENTOLIN HFA) 108 (90 Base) MCG/ACT inhaler Inhale 2 puffs into the lungs every 6 (six) hours as needed for wheezing or shortness of breath. Patient not taking: Reported on 10/23/2018 04/18/17   Johnn Hai, PA-C  aspirin EC 81 MG tablet Take 1 tablet (81 mg total) by mouth daily. 10/23/18   Charlott Rakes, MD  atorvastatin (LIPITOR) 40 MG tablet Take  1 tablet (40 mg total) by mouth daily. 10/23/18   Charlott Rakes, MD  Blood Glucose Monitoring Suppl (TRUE METRIX METER) w/Device KIT 1 each by Does not apply route 3 (three) times daily. Patient not taking: Reported on 09/04/2018 01/25/17   Tresa Garter, MD  buPROPion (WELLBUTRIN) 100 MG tablet Take 1 tablet (100 mg total) by mouth 2 (two) times daily. 10/23/18   Charlott Rakes, MD  busPIRone (BUSPAR) 10 MG tablet Take 1 tablet (10 mg total) by mouth 3 (three) times daily. 10/23/18   Charlott Rakes, MD  citalopram (CELEXA) 10 MG tablet Take 1 tablet (10 mg total) by mouth daily. Patient not  taking: Reported on 09/04/2018 06/21/17   Tresa Garter, MD  glimepiride (AMARYL) 4 MG tablet Take 1 tablet (4 mg total) by mouth daily with breakfast for 30 days. 09/04/18 10/04/18  Gildardo Pounds, NP  glucose blood (TRUE METRIX BLOOD GLUCOSE TEST) test strip Use as instructed Patient not taking: Reported on 09/04/2018 01/31/17   Tresa Garter, MD  HYDROcodone-acetaminophen (NORCO) 5-325 MG tablet Take 1 tablet by mouth every 4 (four) hours as needed for moderate pain. 11/30/18   Duanne Guess, PA-C  metFORMIN (GLUCOPHAGE) 1000 MG tablet Take 1 tablet (1,000 mg total) by mouth 2 (two) times daily with a meal for 30 days. 09/04/18 10/04/18  Gildardo Pounds, NP  metoprolol tartrate (LOPRESSOR) 50 MG tablet Take 1 tablet (50 mg total) by mouth 2 (two) times daily. 09/04/18   Gildardo Pounds, NP  predniSONE (DELTASONE) 10 MG tablet Take 1 tablet (10 mg total) by mouth daily. 6,5,4,3,2,1 six day taper 11/30/18   Duanne Guess, PA-C  Syringe, Disposable, (30-35CC SYRINGE) 35 ML MISC Use for injecting insulin Patient not taking: Reported on 09/04/2018 01/25/17   Tresa Garter, MD  TRUEPLUS LANCETS 28G MISC Check blood sugar tid Patient not taking: Reported on 09/04/2018 01/25/17   Tresa Garter, MD    Allergies Erythromycin base; Benzodiazepines; Xanax [alprazolam]; Methocarbamol; and Sulfa antibiotics  Family History  Problem Relation Age of Onset  . Heart disease Mother   . Diabetes Father   . Heart disease Father     Social History Social History   Tobacco Use  . Smoking status: Current Every Day Smoker    Packs/day: 1.00    Types: Cigarettes  . Smokeless tobacco: Never Used  Substance Use Topics  . Alcohol use: No  . Drug use: No    Review of Systems Constitutional: No fever/chills Eyes: No visual changes. ENT: No sore throat. Cardiovascular: Denies chest pain. Respiratory: Denies shortness of breath. Gastrointestinal: No abdominal pain.  No nausea, no  vomiting.  No diarrhea.  No constipation. Genitourinary: Negative for dysuria. Musculoskeletal: Pain in the neck and right arm/shoulder. Integumentary: Negative for rash. Neurological: Intermittent numbness and tingling in the right upper extremity as described above.  No weakness.   ____________________________________________   PHYSICAL EXAM:  VITAL SIGNS: ED Triage Vitals  Enc Vitals Group     BP 12/10/18 0428 (!) 152/89     Pulse Rate 12/10/18 0428 88     Resp 12/10/18 0428 16     Temp 12/10/18 0428 98.6 F (37 C)     Temp Source 12/10/18 0428 Oral     SpO2 12/10/18 0428 96 %     Weight 12/10/18 0426 86.2 kg (190 lb)     Height 12/10/18 0426 1.803 m ('5\' 11"' )     Head Circumference --  Peak Flow --      Pain Score 12/10/18 0425 10     Pain Loc --      Pain Edu? --      Excl. in [place]? --     Constitutional: Alert and oriented.  Generally well-appearing although he does appear uncomfortable and every now and then he will moan or yell out and hold his right arm in a different position. Eyes: Conjunctivae are normal.  Head: Atraumatic. Nose: No congestion/rhinnorhea. Mouth/Throat: Mucous membranes are moist. Neck: No stridor.  No meningeal signs.   Cardiovascular: Normal rate, regular rhythm. Good peripheral circulation. Grossly normal heart sounds. Respiratory: Normal respiratory effort.  No retractions. No audible wheezing. Gastrointestinal: Soft and nontender. No distention.  Musculoskeletal: No tenderness to palpation along the cervical spine with no step-offs nor deformities.  The patient has normal passive range of motion of the right shoulder and full range of motion.  He does have some pain with extension of the shoulder up over his head.  He is able to flex and extend his head and neck and rotated side to side without any difficulty.  No lower extremity tenderness nor edema. No gross deformities of extremities. Neurologic:  Normal speech and language. No gross  focal neurologic deficits are appreciated.  Skin:  Skin is warm, dry and intact. No rash noted.   ____________________________________________   LABS (all labs ordered are listed, but only abnormal results are displayed)  Labs Reviewed - No data to display ____________________________________________  EKG  No indication for EKG  ____________________________________________  RADIOLOGY   ED MD interpretation: No indication for emergent imaging  Official radiology report(s): No results found.  ____________________________________________   PROCEDURES   Procedure(s) performed (including Critical Care):  Procedures   ____________________________________________   INITIAL IMPRESSION / MDM / [place] / ED COURSE  As part of my medical decision making, I reviewed the following data within the [place] notes reviewed and incorporated, Old chart reviewed, Notes from prior ED visits and [place] [employer] [place] was evaluated in Emergency Department on 12/10/2018 for the symptoms described in the history of present illness. He was evaluated in the context of the global COVID-19 pandemic, which necessitated consideration that the patient might be at risk for infection with the SARS-CoV-2 virus that causes COVID-19. Institutional protocols and algorithms that pertain to the evaluation of patients at risk for COVID-19 are in a state of rapid change based on information released by regulatory bodies including the CDC and federal and state organizations. These policies and algorithms were followed during the patient's care in the ED.  Some ED evaluations and interventions may be delayed as a result of limited staffing during the pandemic.*  Differential diagnosis includes, but is not limited to, chronic pain, drug-seeking behavior, acute musculoskeletal strain, dislocation, fracture, cervical radiculopathy.  The patient  was observed in the waiting room ambulated without difficulty and not being in distress but the patient is reporting severe, 10 out of 10 pain when he was brought back.  I reviewed his medical record and he has numerous visits over the last few years to the emergency department for various pain syndromes, usually involving neck pain and/or radiculopathy.  He has no systemic issues or complaints at this time and his vital signs are stable.  I reviewed the emergency department record, performed by an ED provider who also specializes in orthopedics, and it was very thorough and reassuring.  The patient has no acute neurological deficits and no visible, physical abnormalities on exam.  I believe that he does suffer from chronic pain but that his social situation greatly contributes to of his visits to the emergency department.  I explained to him that even though he is correct that taking prednisone will temporarily elevate his blood glucose, it should be safe to do so and it may have some benefits.  I am giving him a dose of Toradol 15 mg intramuscular tonight as well as a Lidoderm patch and I encouraged him to take the regular medications that have been prescribed and to follow-up either with Dr. Sharlet Salina or with orthopedics (as he was recommended to do on his last visit 4 days ago).  There is no indication for emergent imaging tonight.  I do not recommend continued prescriptions of narcotics at least based on his current presentation tonight although each visit should be evaluated separately.  When I made it clear that I would not be prescribing narcotics again, the patient continually asked for muscle relaxers but I explained that I did not think this would be beneficial, that he has had the multiple times in the past with what he described as limited success, and I am not comfortable prescribing muscle relaxers along with the Norco he already has at home.  I gave him my usual customary return precautions and  follow-up recommendations.      ____________________________________________  FINAL CLINICAL IMPRESSION(S) / ED DIAGNOSES  Final diagnoses:  Chronic neck pain  Chronic right shoulder pain  Cervical radiculopathy     MEDICATIONS GIVEN DURING THIS VISIT:  Medications  ketorolac (TORADOL) 30 MG/ML injection 15 mg (has no administration in time range)  lidocaine (LIDODERM) 5 % 1 patch (has no administration in time range)     ED Discharge Orders    None       Note:  This document was prepared using Dragon voice recognition software and may include unintentional dictation errors.   Hinda Kehr, MD 12/10/18 [phone]

## 2018-12-10 NOTE — ED Notes (Signed)
Pt presents tonight with ongoing pain in right shoulder and the right side of his neck; cramping/spams with tingling down the right arm; was seen here about a week ago for the same issue and was prescribed Prednisone taper and Norco; pt says he filled the Norco but not the prednisone; says he already has trouble with his blood sugar and doesn't want to create more; he took [place] last at [place] yesterday, unable to say why he hasn't taken any since; pt adds the person he has been staying with is currently in the hospital and he has nowhere to stay; says he has been up all night, sitting/sleeping on a bench somewhere in [place]; woke at 5am in pain and just decided to come here; pt in no acute distress

## 2018-12-10 NOTE — ED Notes (Signed)
About to give pt Toradol injection as prescribed when he refused; said he doesn't have anyone to come get him and he can't walk after the shot; discussed the medication and side affects with pt and he continues to decline

## 2019-05-01 ENCOUNTER — Emergency Department
Admission: EM | Admit: 2019-05-01 | Discharge: 2019-05-01 | Disposition: A | Discharge status: Home / Self Care | Payer: Self-pay | Attending: Emergency Medicine | Admitting: Emergency Medicine

## 2019-05-01 ENCOUNTER — Encounter: Payer: Self-pay | Admitting: Emergency Medicine

## 2019-05-01 ENCOUNTER — Other Ambulatory Visit: Payer: Self-pay

## 2019-05-01 DIAGNOSIS — Z7984 Long term (current) use of oral hypoglycemic drugs: Secondary | ICD-10-CM | POA: Insufficient documentation

## 2019-05-01 DIAGNOSIS — E119 Type 2 diabetes mellitus without complications: Secondary | ICD-10-CM | POA: Insufficient documentation

## 2019-05-01 DIAGNOSIS — E1140 Type 2 diabetes mellitus with diabetic neuropathy, unspecified: Secondary | ICD-10-CM | POA: Insufficient documentation

## 2019-05-01 DIAGNOSIS — Z79899 Other long term (current) drug therapy: Secondary | ICD-10-CM | POA: Insufficient documentation

## 2019-05-01 DIAGNOSIS — M5030 Other cervical disc degeneration, unspecified cervical region: Secondary | ICD-10-CM | POA: Insufficient documentation

## 2019-05-01 DIAGNOSIS — I10 Essential (primary) hypertension: Secondary | ICD-10-CM | POA: Insufficient documentation

## 2019-05-01 DIAGNOSIS — Z7982 Long term (current) use of aspirin: Secondary | ICD-10-CM | POA: Insufficient documentation

## 2019-05-01 DIAGNOSIS — F17210 Nicotine dependence, cigarettes, uncomplicated: Secondary | ICD-10-CM | POA: Insufficient documentation

## 2019-05-01 MED ORDER — CYCLOBENZAPRINE HCL 5 MG PO TABS: 5.0000 mg | ORAL_TABLET | Freq: Every day | ORAL | 0 refills | Status: DC

## 2019-05-01 MED ORDER — ORPHENADRINE CITRATE 30 MG/ML IJ SOLN
60.0000 mg | INTRAMUSCULAR | Status: AC
  Administered 2019-05-01: 60 mg via INTRAMUSCULAR
  Filled 2019-05-01: qty 2

## 2019-05-01 MED ORDER — MELOXICAM 15 MG PO TABS: 15.0000 mg | ORAL_TABLET | Freq: Every day | ORAL | 2 refills | Status: AC

## 2019-05-01 MED ORDER — KETOROLAC TROMETHAMINE 30 MG/ML IJ SOLN
30.0000 mg | Freq: Once | INTRAMUSCULAR | Status: AC
  Administered 2019-05-01: 30 mg via INTRAMUSCULAR
  Filled 2019-05-01: qty 1

## 2019-05-01 NOTE — Discharge Instructions (Signed)
Your exam is likely due to muscle strain and "wear & tear" arthritis of the neck. Take the prescription anti-inflammatory daily as directed. Take the muscle relaxant as needed, for muscle pain relief. Follow-up with your provider for ongoing symptoms.

## 2019-05-01 NOTE — ED Triage Notes (Signed)
Pt in via POV reports left neck and shoulder pain x a few days, denies any recent injury, does report hx arthritis and damage to C5.  Ambulatory to triage; NAD noted at this time.

## 2019-05-01 NOTE — ED Notes (Signed)
15 min med hold (IM injections)

## 2019-05-01 NOTE — ED Provider Notes (Signed)
[facility] Emergency Department Provider Note ____________________________________________  Time seen: 1437  I have reviewed the triage vital signs and the nursing notes.  HISTORY  Chief Complaint  Neck Pain and Shoulder Pain  HPI ODAI WIMMER is a 58 y.o. male presents to the ED for evaluation of pain to the left neck and upper shoulder for the last few weeks.  Patient denies any recent injury, trauma, or accident.  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  Patient reports a history of arthritis and is unclear of his diagnosis but believes he has been told in the past he has bone spurs and arthritis in his neck.  He denies any grip changes, chest pain, shortness of breath.  He is not take any medications any interim for symptom relief.  Has not been seen by his primary provider for the same complaints.  Patient is requesting treatment for his underlying arthritis.   Past Medical History:  Diagnosis Date  . Blood clot in vein   . Diabetes mellitus   . DVT (deep venous thrombosis) ([place])   . Hypertension     Patient Active Problem List   Diagnosis Date Noted  . Peripheral vascular disease ([place]) 10/23/2018  . Neuropathic pain of ankle, left 01/25/2017  . Adjustment disorder with mixed anxiety and depressed mood 08/27/2016  . Hypertriglyceridemia 06/30/2016  . Thrombocytopenia, unspecified ([place]) 12/06/2012  . Diabetic neuropathy ([place]) 12/05/2012  . Tobacco use disorder 12/05/2012  . Dyslipidemia 12/05/2012  . Type 2 diabetes mellitus ([place]) 05/11/2011  . Essential hypertension 05/11/2011    Past Surgical History:  Procedure Laterality Date  . APPENDECTOMY    . VEIN LIGATION AND STRIPPING     of left leg    Prior to Admission medications   Medication Sig Start Date End Date Taking? Authorizing Provider  acetaminophen (TYLENOL) 325 MG tablet Take 650 mg by mouth every 6 (six) hours as needed.     [provider]  albuterol (PROVENTIL HFA;VENTOLIN HFA) 108 (90 Base) MCG/ACT inhaler Inhale 2 puffs into the lungs every 6 (six) hours as needed for wheezing or shortness of breath. Patient not taking: Reported on 10/23/2018 04/18/17   Johnn Hai, PA-C  aspirin EC 81 MG tablet Take 1 tablet (81 mg total) by mouth daily. 10/23/18   Charlott Rakes, MD  atorvastatin (LIPITOR) 40 MG tablet Take 1 tablet (40 mg total) by mouth daily. 10/23/18   Charlott Rakes, MD  Blood Glucose Monitoring Suppl (TRUE METRIX METER) w/Device KIT 1 each by Does not apply route 3 (three) times daily. Patient not taking: Reported on 09/04/2018 01/25/17   Tresa Garter, MD  buPROPion (WELLBUTRIN) 100 MG tablet Take 1 tablet (100 mg total) by mouth 2 (two) times daily. 10/23/18   Charlott Rakes, MD  busPIRone (BUSPAR) 10 MG tablet Take 1 tablet (10 mg total) by mouth 3 (three) times daily. 10/23/18   Charlott Rakes, MD  citalopram (CELEXA) 10 MG tablet Take 1 tablet (10 mg total) by mouth daily. Patient not taking: Reported on 09/04/2018 06/21/17   Tresa Garter, MD  glimepiride (AMARYL) 4 MG tablet Take 1 tablet (4 mg total) by mouth daily with breakfast for 30 days. 09/04/18 10/04/18  Gildardo Pounds, NP  glucose blood (TRUE METRIX BLOOD GLUCOSE TEST) test strip Use as instructed Patient not taking: Reported on 09/04/2018 01/31/17   Tresa Garter, MD  HYDROcodone-acetaminophen (NORCO) 5-325 MG tablet Take 1  tablet by mouth every 4 (four) hours as needed for moderate pain. 11/30/18   Duanne Guess, PA-C  lidocaine (LIDODERM) 5 % Place 1 patch onto the skin every 12 (twelve) hours. Remove & Discard patch within 12 hours or as directed by MD.  [place] the patch off for 12 hours before applying a new one. 12/10/18 12/10/19  Hinda Kehr, MD  metFORMIN (GLUCOPHAGE) 1000 MG tablet Take 1 tablet (1,000 mg total) by mouth 2 (two) times daily with a meal for 30 days. 09/04/18 10/04/18  Gildardo Pounds, NP   metoprolol tartrate (LOPRESSOR) 50 MG tablet Take 1 tablet (50 mg total) by mouth 2 (two) times daily. 09/04/18   Gildardo Pounds, NP  predniSONE (DELTASONE) 10 MG tablet Take 1 tablet (10 mg total) by mouth daily. 6,5,4,3,2,1 six day taper 11/30/18   Duanne Guess, PA-C  Syringe, Disposable, (30-35CC SYRINGE) 35 ML MISC Use for injecting insulin Patient not taking: Reported on 09/04/2018 01/25/17   Tresa Garter, MD  TRUEPLUS LANCETS 28G MISC Check blood sugar tid Patient not taking: Reported on 09/04/2018 01/25/17   Tresa Garter, MD    Allergies Erythromycin base, Xanax [alprazolam], Benzodiazepines, Methocarbamol, and Sulfa antibiotics  Family History  Problem Relation Age of Onset  . Heart disease Mother   . Diabetes Father   . Heart disease Father     Social History Social History   Tobacco Use  . Smoking status: Current Every Day Smoker    Packs/day: 1.00    Types: Cigarettes  . Smokeless tobacco: Never Used  Substance Use Topics  . Alcohol use: No  . Drug use: No    Review of Systems  Constitutional: Negative for fever. Cardiovascular: Negative for chest pain. Respiratory: Negative for shortness of breath. Gastrointestinal: Negative for abdominal pain, vomiting and diarrhea. Genitourinary: Negative for dysuria. Musculoskeletal: Negative for back pain. Reports neck and bilateral trapezius musculature pain. Skin: Negative for rash. Neurological: Negative for headaches, focal weakness or numbness. ____________________________________________  PHYSICAL EXAM:  VITAL SIGNS: ED Triage Vitals  Enc Vitals Group     BP 05/01/19 1325 (!) 143/93     Pulse Rate 05/01/19 1325 78     Resp 05/01/19 1325 18     Temp 05/01/19 1325 98.6 F (37 C)     Temp Source 05/01/19 1325 Oral     SpO2 05/01/19 1325 96 %     Weight 05/01/19 1325 195 lb (88.5 kg)     Height 05/01/19 1325 '5\' 11"'  (1.803 m)     Head Circumference --      Peak Flow --      Pain Score 05/01/19  1330 8     Pain Loc --      Pain Edu? --      Excl. in [place]? --     Constitutional: Alert and oriented. Well appearing and in no distress. Head: Normocephalic and atraumatic. Eyes: Conjunctivae are normal. Normal extraocular movements Neck: Supple. Normal ROM without midline tenderness, spasm, or deformity.  Cardiovascular: Normal rate, regular rhythm. Normal distal pulses. Respiratory: Normal respiratory effort. No wheezes/rales/rhonchi. Gastrointestinal: Soft and nontender. No distention. Musculoskeletal: Normal spinal alignment without midline tenderness. Normal UE resistance testing.  No rotator cuff deficits appreciated.  No palpable mild spasm of muscle deficits noted.  Patient with normal composite fist and grip strength bilaterally.  Nontender with normal range of motion in all extremities.  Neurologic: [place] II-XII grossly intact. Normal UE DTRs bilaterally.  Normal gait without ataxia.  Normal speech and language. No gross focal neurologic deficits are appreciated. Skin:  Skin is warm, dry and intact. No rash noted. ____________________________________________  PROCEDURES  Toradol 30 mg IM Norflex 60 mg IM Procedures ____________________________________________  INITIAL IMPRESSION / ASSESSMENT AND PLAN / ED COURSE  Patient with ED evaluation of intermittent left shoulder and neck pain for the last few days.  Patient's exam is overall benign and reassuring at this time.  No acute neuromuscular deficit noted.  Patient clinical picture is more consistent with a myalgias and neck pain due to underlying DDD. He reports improvement of his pain after IM medication administration. He will be referred to his PCP for ongoing management.   LEMOINE GOYNE was evaluated in Emergency Department on 05/01/2019 for the symptoms described in the history of present illness. He was evaluated in the context of the global COVID-19 pandemic, which necessitated consideration that the patient might be at risk  for infection with the SARS-CoV-2 virus that causes COVID-19. Institutional protocols and algorithms that pertain to the evaluation of patients at risk for COVID-19 are in a state of rapid change based on information released by regulatory bodies including the CDC and federal and state organizations. These policies and algorithms were followed during the patient's care in the ED. ____________________________________________  FINAL CLINICAL IMPRESSION(S) / ED DIAGNOSES  Final diagnoses:  DDD (degenerative disc disease), cervical      Rylynne Schicker, Dannielle Karvonen, PA-C 05/01/19 Evalee Jefferson    Duffy Bruce, MD 05/01/19 2034

## 2019-05-20 ENCOUNTER — Other Ambulatory Visit: Payer: Self-pay | Admitting: Nurse Practitioner

## 2019-05-20 DIAGNOSIS — I10 Essential (primary) hypertension: Secondary | ICD-10-CM

## 2019-05-20 DIAGNOSIS — E1110 Type 2 diabetes mellitus with ketoacidosis without coma: Secondary | ICD-10-CM

## 2019-05-23 ENCOUNTER — Emergency Department
Admission: EM | Admit: 2019-05-23 | Discharge: 2019-05-23 | Disposition: A | Discharge status: Home / Self Care | Payer: Self-pay | Attending: Emergency Medicine | Admitting: Emergency Medicine

## 2019-05-23 ENCOUNTER — Other Ambulatory Visit: Payer: Self-pay

## 2019-05-23 DIAGNOSIS — E119 Type 2 diabetes mellitus without complications: Secondary | ICD-10-CM | POA: Insufficient documentation

## 2019-05-23 DIAGNOSIS — F17210 Nicotine dependence, cigarettes, uncomplicated: Secondary | ICD-10-CM | POA: Insufficient documentation

## 2019-05-23 DIAGNOSIS — E1110 Type 2 diabetes mellitus with ketoacidosis without coma: Secondary | ICD-10-CM

## 2019-05-23 DIAGNOSIS — I10 Essential (primary) hypertension: Secondary | ICD-10-CM | POA: Insufficient documentation

## 2019-05-23 DIAGNOSIS — Z7984 Long term (current) use of oral hypoglycemic drugs: Secondary | ICD-10-CM | POA: Insufficient documentation

## 2019-05-23 DIAGNOSIS — Z79899 Other long term (current) drug therapy: Secondary | ICD-10-CM | POA: Insufficient documentation

## 2019-05-23 DIAGNOSIS — Z760 Encounter for issue of repeat prescription: Secondary | ICD-10-CM | POA: Insufficient documentation

## 2019-05-23 DIAGNOSIS — Z7982 Long term (current) use of aspirin: Secondary | ICD-10-CM | POA: Insufficient documentation

## 2019-05-23 LAB — GLUCOSE, CAPILLARY: Glucose-Capillary: 134 mg/dL — ABNORMAL HIGH (ref 70–99)

## 2019-05-23 MED ORDER — GLIMEPIRIDE 4 MG PO TABS: 4.0000 mg | ORAL_TABLET | Freq: Every day | ORAL | 3 refills | Status: DC

## 2019-05-23 MED ORDER — METFORMIN HCL 1000 MG PO TABS: 1000.0000 mg | ORAL_TABLET | Freq: Two times a day (BID) | ORAL | 3 refills | Status: DC

## 2019-05-23 MED ORDER — METOPROLOL TARTRATE 50 MG PO TABS: 50.0000 mg | ORAL_TABLET | Freq: Two times a day (BID) | ORAL | 3 refills | Status: DC

## 2019-05-23 NOTE — Discharge Instructions (Signed)
Follow-up with your primary care provider if any continued problems or concerns.  Begin taking your medication as directed.  Also call medication management to see if there are forms that you need to fill out or prove of financial need so that you might be able to get your medications there.  The contact information is listed on your discharge papers.

## 2019-05-23 NOTE — ED Provider Notes (Signed)
[facility] Emergency Department Provider Note  ____________________________________________   First MD Initiated Contact with Patient 05/23/19 1428     (approximate)  I have reviewed the triage vital signs and the nursing notes.   HISTORY  Chief Complaint Medication Refill   HPI [name] is a 58 y.o. male presents to the ED for refill of his medication.  Patient states that he is not able to get an appointment with his doctor at the wellness center in [place] due to [place] and needs refills.  Patient also has not checked his blood sugar in quite some time as he cannot afford the strips or a meter.  He denies any difficulties.      Past Medical History:  Diagnosis Date  . Blood clot in vein   . Diabetes mellitus   . DVT (deep venous thrombosis) (HCC)   . Hypertension     Patient Active Problem List   Diagnosis Date Noted  . Peripheral vascular disease (HCC) 10/23/2018  . Neuropathic pain of ankle, left 01/25/2017  . Adjustment disorder with mixed anxiety and depressed mood 08/27/2016  . Hypertriglyceridemia 06/30/2016  . Thrombocytopenia, unspecified (HCC) 12/06/2012  . Diabetic neuropathy (HCC) 12/05/2012  . Tobacco use disorder 12/05/2012  . Dyslipidemia 12/05/2012  . Type 2 diabetes mellitus (HCC) 05/11/2011  . Essential hypertension 05/11/2011    Past Surgical History:  Procedure Laterality Date  . APPENDECTOMY    . VEIN LIGATION AND STRIPPING     of left leg    Prior to Admission medications   Medication Sig Start Date End Date Taking? Authorizing Provider  acetaminophen (TYLENOL) 325 MG tablet Take 650 mg by mouth every 6 (six) hours as needed.    [provider]  aspirin EC 81 MG tablet Take 1 tablet (81 mg total) by mouth daily. 10/23/18   Hoy Register, MD  atorvastatin (LIPITOR) 40 MG tablet Take 1 tablet (40 mg total) by mouth daily. 10/23/18   Hoy Register, MD  buPROPion (WELLBUTRIN) 100 MG tablet Take 1  tablet (100 mg total) by mouth 2 (two) times daily. 10/23/18   Hoy Register, MD  busPIRone (BUSPAR) 10 MG tablet Take 1 tablet (10 mg total) by mouth 3 (three) times daily. 10/23/18   Hoy Register, MD  glimepiride (AMARYL) 4 MG tablet Take 1 tablet (4 mg total) by mouth daily with breakfast. 05/23/19 06/22/19  Bridget Hartshorn L, PA-C  glucose blood (TRUE METRIX BLOOD GLUCOSE TEST) test strip Use as instructed Patient not taking: Reported on 09/04/2018 01/31/17   Quentin Angst, MD  HYDROcodone-acetaminophen (NORCO) 5-325 MG tablet Take 1 tablet by mouth every 4 (four) hours as needed for moderate pain. 11/30/18   Evon Slack, PA-C  lidocaine (LIDODERM) 5 % Place 1 patch onto the skin every 12 (twelve) hours. Remove & Discard patch within 12 hours or as directed by MD.  Wynelle Fanny the patch off for 12 hours before applying a new one. 12/10/18 12/10/19  Loleta Rose, MD  meloxicam (MOBIC) 15 MG tablet Take 1 tablet (15 mg total) by mouth daily. 05/01/19 07/30/19  Menshew, Charlesetta Ivory, PA-C  metFORMIN (GLUCOPHAGE) 1000 MG tablet Take 1 tablet (1,000 mg total) by mouth 2 (two) times daily with a meal. 05/23/19 06/22/19  Bridget Hartshorn L, PA-C  metoprolol tartrate (LOPRESSOR) 50 MG tablet Take 1 tablet (50 mg total) by mouth 2 (two) times daily. 05/23/19   Tommi Rumps, PA-C  Syringe, Disposable, (30-35CC SYRINGE) 35 ML MISC  Use for injecting insulin Patient not taking: Reported on 09/04/2018 01/25/17   Quentin AngstJegede, Olugbemiga E, MD    Allergies Erythromycin base, Xanax [alprazolam], Benzodiazepines, Methocarbamol, and Sulfa antibiotics  Family History  Problem Relation Age of Onset  . Heart disease Mother   . Diabetes Father   . Heart disease Father     Social History Social History   Tobacco Use  . Smoking status: Current Every Day Smoker    Packs/day: 1.00    Types: Cigarettes  . Smokeless tobacco: Never Used  Substance Use Topics  . Alcohol use: No  . Drug use: No    Review of  Systems Constitutional: No fever/chills Cardiovascular: Denies chest pain. Respiratory: Denies shortness of breath. Musculoskeletal: Negative for muscle aches. Skin: Negative for rash. Neurological: Negative for headaches, focal weakness or numbness. ____________________________________________   PHYSICAL EXAM:  VITAL SIGNS: ED Triage Vitals  Enc Vitals Group     BP 05/23/19 1330 (!) 153/90     Pulse Rate 05/23/19 1330 68     Resp 05/23/19 1330 18     Temp 05/23/19 1330 97.6 F (36.4 C)     Temp Source 05/23/19 1330 Oral     SpO2 05/23/19 1330 99 %     Weight 05/23/19 1334 195 lb (88.5 kg)     Height 05/23/19 1334 5\' 11"  (1.803 m)     Head Circumference --      Peak Flow --      Pain Score --      Pain Loc --      Pain Edu? --      Excl. in GC? --    Constitutional: Alert and oriented. Well appearing and in no acute distress. Eyes: Conjunctivae are normal.  Head: Atraumatic. Neck: No stridor.   Cardiovascular: Normal rate, regular rhythm. Grossly normal heart sounds.  Good peripheral circulation. Respiratory: Normal respiratory effort.  No retractions. Lungs CTAB. Musculoskeletal: No lower extremity tenderness nor edema.  No joint effusions. Neurologic:  Normal speech and language. No gross focal neurologic deficits are appreciated. No gait instability. Skin:  Skin is warm, dry and intact. No rash noted. Psychiatric: Mood and affect are normal. Speech and behavior are normal.  ____________________________________________   LABS (all labs ordered are listed, but only abnormal results are displayed)  Labs Reviewed  GLUCOSE, CAPILLARY - Abnormal; Notable for the following components:      Result Value   Glucose-Capillary 134 (*)    All other components within normal limits  CBG MONITORING, ED   PROCEDURES  Procedure(s) performed (including Critical Care):  Procedures   ____________________________________________   INITIAL IMPRESSION / ASSESSMENT AND PLAN  / ED COURSE  As part of my medical decision making, I reviewed the following data within the electronic MEDICAL RECORD NUMBER Notes from prior ED visits and [place] Controlled Substance Database  58 year old male presents to the ED for refill of his blood pressure and diabetes medication.  Patient has been able to get a appointment at the wellness center in [place] due to "[place]ov[place]".  Patient is also not been checking his blood sugars as he states he cannot afford the test strips or the glucometer.  He denies any difficulties.  [facility] [address] per his request.  He was also given information about the medication management to consider in the future.  ____________________________________________   FINAL CLINICAL IMPRESSION(S) / ED DIAGNOSES  Final diagnoses:  Encounter for medication refill  ED Discharge Orders         Ordered    glimepiride (AMARYL) 4 MG tablet  Daily with breakfast    Note to Pharmacy: Dose change   05/23/19 1519    metFORMIN (GLUCOPHAGE) 1000 MG tablet  2 times daily with meals     05/23/19 1519    metoprolol tartrate (LOPRESSOR) 50 MG tablet  2 times daily     05/23/19 1519           Note:  This document was prepared using Dragon voice recognition software and may include unintentional dictation errors.    Johnn Hai, PA-C 05/23/19 1524    Delman Kitten, MD 05/23/19 [phone]

## 2019-05-23 NOTE — ED Notes (Signed)
See triage note  States [facility] [place]  But unable to get appt d/t COVID  Needs medication refill

## 2019-05-23 NOTE — ED Triage Notes (Signed)
Pt states he needs a medication refill for his b/p meds and metformin

## 2019-06-27 ENCOUNTER — Other Ambulatory Visit: Payer: Self-pay

## 2019-06-27 ENCOUNTER — Emergency Department
Admission: EM | Admit: 2019-06-27 | Discharge: 2019-06-27 | Disposition: A | Discharge status: Home / Self Care | Payer: Self-pay | Attending: Emergency Medicine | Admitting: Emergency Medicine

## 2019-06-27 ENCOUNTER — Encounter: Payer: Self-pay | Admitting: Intensive Care

## 2019-06-27 DIAGNOSIS — R197 Diarrhea, unspecified: Secondary | ICD-10-CM | POA: Insufficient documentation

## 2019-06-27 DIAGNOSIS — Z5321 Procedure and treatment not carried out due to patient leaving prior to being seen by health care provider: Secondary | ICD-10-CM | POA: Insufficient documentation

## 2019-06-27 HISTORY — DX: Polyneuropathy, unspecified: G62.9

## 2019-06-27 HISTORY — DX: Unspecified osteoarthritis, unspecified site: M19.90

## 2019-06-27 NOTE — ED Triage Notes (Signed)
Patient reports he ate some food last night and then started having diarrhea a couple hours later. Reports his diarrhea is now gone but still feels a little fatigued. Patient also states "maybe I should be checked for COVID." Patient reports he walked to ER to be checked out for possible food poisoning

## 2019-07-17 ENCOUNTER — Other Ambulatory Visit: Payer: Self-pay | Admitting: Nurse Practitioner

## 2019-07-17 DIAGNOSIS — E1110 Type 2 diabetes mellitus with ketoacidosis without coma: Secondary | ICD-10-CM

## 2019-07-17 DIAGNOSIS — I10 Essential (primary) hypertension: Secondary | ICD-10-CM

## 2019-08-15 ENCOUNTER — Ambulatory Visit: Payer: Self-pay | Admitting: Gerontology

## 2019-08-15 ENCOUNTER — Other Ambulatory Visit: Payer: Self-pay

## 2019-08-15 ENCOUNTER — Encounter: Payer: Self-pay | Admitting: Gerontology

## 2019-08-15 VITALS — BP 137/89 | HR 88 | Temp 97.8°F | Resp 19 | Ht 71.0 in | Wt 203.2 lb

## 2019-08-15 DIAGNOSIS — E1149 Type 2 diabetes mellitus with other diabetic neurological complication: Secondary | ICD-10-CM

## 2019-08-15 DIAGNOSIS — Z7689 Persons encountering health services in other specified circumstances: Secondary | ICD-10-CM

## 2019-08-15 DIAGNOSIS — E1142 Type 2 diabetes mellitus with diabetic polyneuropathy: Secondary | ICD-10-CM

## 2019-08-15 DIAGNOSIS — F17200 Nicotine dependence, unspecified, uncomplicated: Secondary | ICD-10-CM

## 2019-08-15 DIAGNOSIS — I10 Essential (primary) hypertension: Secondary | ICD-10-CM

## 2019-08-15 DIAGNOSIS — E1110 Type 2 diabetes mellitus with ketoacidosis without coma: Secondary | ICD-10-CM

## 2019-08-15 MED ORDER — GABAPENTIN 100 MG PO CAPS: 100.0000 mg | ORAL_CAPSULE | Freq: Every day | ORAL | 0 refills | Status: DC

## 2019-08-15 MED ORDER — BLOOD GLUCOSE MONITOR KIT: PACK | 0 refills | Status: DC

## 2019-08-15 MED ORDER — METOPROLOL TARTRATE 50 MG PO TABS: 50.0000 mg | ORAL_TABLET | Freq: Two times a day (BID) | ORAL | 2 refills | Status: DC

## 2019-08-15 MED ORDER — METFORMIN HCL 1000 MG PO TABS: 1000.0000 mg | ORAL_TABLET | Freq: Two times a day (BID) | ORAL | 2 refills | Status: DC

## 2019-08-15 MED ORDER — GLIMEPIRIDE 4 MG PO TABS: 4.0000 mg | ORAL_TABLET | Freq: Every day | ORAL | 2 refills | Status: DC

## 2019-08-15 NOTE — Progress Notes (Signed)
Patient ID: [name], male   DOB: 06-Sep-1960, 59 y.o.   MRN: [id number]  No chief complaint on file.   HPI [name] is a 59 y.o. male who presents to establish care and evaluation of his chronic conditions. He has a history of diabetes and takes 4 mg Glimepiride daily and 1000 mg Metformin bid. He states that he has not been checking his blood glucose because he has no stripes and lancet for many years. His blood glucose was 251 mg/dl when checked during his visit. He denies hypoglycemic symptoms, but admits to experiencing hyperglycemic symptoms. He also c/o peripheral neuropathy and states that he has varicose vein to his left lower leg, but he denies erythema and claudication. He also states that he also has a history of hypertension and takes 50 mg of Metoprolol bid. He smokes 2 packs of cigarette daily and admits the desire to quit. He also has Arthritis to his left knee that has been going on for many years. He denies chest pain, palpitation, vision changes, shortness of breath, fever and chills. Overall, he states that he's doing well and offers no further complaint.   Past Medical History:  Diagnosis Date  . Arthritis   . Blood clot in vein   . Diabetes mellitus   . DVT (deep venous thrombosis) ([place] [facility] [place])   . Hypertension   . Neuropathy     Past Surgical History:  Procedure Laterality Date  . APPENDECTOMY    . VEIN LIGATION AND STRIPPING     of left leg    Family History  Problem Relation Age of Onset  . Heart disease Mother   . Diabetes Father   . Heart disease Father     Social History Social History   Tobacco Use  . Smoking status: Current Every Day Smoker    Packs/day: 2.00    Years: 6.00    Pack years: 12.00    Types: Cigarettes  . Smokeless tobacco: Never Used  Substance Use Topics  . Alcohol use: No  . Drug use: No    Allergies  Allergen Reactions  . Erythromycin Base Anxiety  . Xanax [Alprazolam] Other (See Comments)    Tachycardia  .  Benzodiazepines     PT STATES HE CANNOT TAKE THESE BECAUSE THEY ALMOST KILLED HIM  . Methocarbamol Anxiety and Other (See Comments)    Stomach cramping, weakness  . Sulfa Antibiotics Nausea And Vomiting and Other (See Comments)    Cramping in stomach and hyperventilate.     Current Outpatient Medications  Medication Sig Dispense Refill  . acetaminophen (TYLENOL) 325 MG tablet Take 650 mg by mouth every 6 (six) hours as needed.    Marland Kitchen aspirin EC 81 MG tablet Take 1 tablet (81 mg total) by mouth daily. 30 tablet 3  . atorvastatin (LIPITOR) 40 MG tablet Take 1 tablet (40 mg total) by mouth daily. 30 tablet 3  . buPROPion (WELLBUTRIN) 100 MG tablet Take 1 tablet (100 mg total) by mouth 2 (two) times daily. 60 tablet 3  . busPIRone (BUSPAR) 10 MG tablet Take 1 tablet (10 mg total) by mouth 3 (three) times daily. 90 tablet 3  . glimepiride (AMARYL) 4 MG tablet Take 1 tablet (4 mg total) by mouth daily with breakfast. 30 tablet 3  . glucose blood (TRUE METRIX BLOOD GLUCOSE TEST) test strip Use as instructed (Patient not taking: Reported on 09/04/2018) 100 each 12  . HYDROcodone-acetaminophen (NORCO) 5-325 MG tablet Take 1 tablet by mouth every  4 (four) hours as needed for moderate pain. 20 tablet 0  . lidocaine (LIDODERM) 5 % Place 1 patch onto the skin every 12 (twelve) hours. Remove & Discard patch within 12 hours or as directed by MD.  Pershing Proud the patch off for 12 hours before applying a new one. 10 patch 0  . metFORMIN (GLUCOPHAGE) 1000 MG tablet Take 1 tablet (1,000 mg total) by mouth 2 (two) times daily with a meal. 60 tablet 3  . metoprolol tartrate (LOPRESSOR) 50 MG tablet Take 1 tablet (50 mg total) by mouth 2 (two) times daily. 60 tablet 3  . Syringe, Disposable, (30-35CC SYRINGE) 35 ML MISC Use for injecting insulin (Patient not taking: Reported on 09/04/2018) 100 each 1   No current facility-administered medications for this visit.    Review of Systems Review of Systems  Constitutional:  Negative.   HENT: Negative.   Eyes: Negative.   Respiratory: Negative.   Cardiovascular: Negative.   Gastrointestinal: Negative.   Endocrine: Positive for polydipsia, polyphagia and polyuria.  Genitourinary: Negative.   Musculoskeletal: Positive for arthralgias (Arthritis to left knee).  Skin: Negative.   Neurological: Positive for numbness (neuropathy).  Psychiatric/Behavioral: Negative.     Blood pressure 137/89, pulse 88, temperature 97.8 F (36.6 C), resp. rate 19, height '5\' 11"'  (1.803 m), weight 203 lb 3.2 oz (92.2 kg), SpO2 97 %.  Physical Exam Physical Exam Constitutional:      Appearance: Normal appearance.  HENT:     Head: Normocephalic and atraumatic.     Nose:     Comments: Deferred per Covid protocol mask wearing    Mouth/Throat:     Comments: Deferred per Covid protocol mask wearing Eyes:     Extraocular Movements: Extraocular movements intact.     Pupils: Pupils are equal, round, and reactive to light.  Cardiovascular:     Rate and Rhythm: Normal rate and regular rhythm.     Pulses: Normal pulses.     Heart sounds: Normal heart sounds.  Pulmonary:     Effort: Pulmonary effort is normal.     Breath sounds: Normal breath sounds.  Abdominal:     General: Bowel sounds are normal.     Palpations: Abdomen is soft.  Genitourinary:    Comments: Deferred per patient. Musculoskeletal:        General: Normal range of motion.     Cervical back: Normal range of motion.  Skin:    General: Skin is warm and dry.  Neurological:     General: No focal deficit present.     Mental Status: He is alert and oriented to person, place, and time. Mental status is at baseline.  Psychiatric:        Mood and Affect: Mood normal.        Behavior: Behavior normal.        Thought Content: Thought content normal.        Judgment: Judgment normal.     Data Reviewed Labs and past medical history was reviewed.  Assessment and Plan  1. Encounter to establish care - Routine lab  will be checked - Lipid panel; Future - Comp Met (CMET); Future - CBC w/Diff; Future - Urinalysis; Future - Urine Microalbumin w/creat. ratio; Future - HgB A1c; Future - TSH; Future   2. Essential hypertension - His blood pressure is under control, and he will continue on current medication. He was advised to continue on DASH diet, exercise as tolerated, check his blood pressure weekly at the Pharmacy, record  and bring log to follow up appointment. His goal blood pressure should be less than 140/90 and normal is less than 120/80. - metoprolol tartrate (LOPRESSOR) 50 MG tablet; Take 1 tablet (50 mg total) by mouth 2 (two) times daily.  Dispense: 60 tablet; Refill: 2  3. Type 2 diabetes mellitus with other neurologic complication, without long-term current use of insulin ([place]) - He will continue on current medication until his HgbA1c is obtained. He was provided with glucometer, stripes and lancets, was advised to check fasting blood glucose daily, record and bring log to follow up appointment, and his goal should be between 80-130 mg/dl. He was encouraged to continue on low car/non concentrated sweet diet. And exercise as tolerated.  blood glucose meter kit and supplies KIT; Dispense based on patient and insurance preference. Use up to four times daily as directed. (FOR ICD-9 250.00, 250.01).  Dispense: 1 each; Refill: 0 - glimepiride (AMARYL) 4 MG tablet; Take 1 tablet (4 mg total) by mouth daily with breakfast.  Dispense: 30 tablet; Refill: 2 - metFORMIN (GLUCOPHAGE) 1000 MG tablet; Take 1 tablet (1,000 mg total) by mouth 2 (two) times daily with a meal.  Dispense: 60 tablet; Refill: 2   4. Diabetic polyneuropathy associated with type 2 diabetes mellitus ([place]) - He will start gabapentin 100 mg daily, was educated on medication side effect and was advised to notify clinic. He was also advised to perform daily foot check. - gabapentin (NEURONTIN) 100 MG capsule; Take 1 capsule (100 mg total) by  mouth at bedtime.  Dispense: 30 capsule; Refill: 0  5. Tobacco use disorder - He was strongly encouraged on smoking cessation and was provided with Post Quit line information.  Follow up: 09/04/2019 or if symptom worsens or fail to improve.  Taven Strite E Ahan Eisenberger 08/15/2019, 11:33 AM

## 2019-08-15 NOTE — Patient Instructions (Signed)
DASH Eating Plan DASH stands for "Dietary Approaches to Stop Hypertension." The DASH eating plan is a healthy eating plan that has been shown to reduce high blood pressure (hypertension). It may also reduce your risk for type 2 diabetes, heart disease, and stroke. The DASH eating plan may also help with weight loss. What are tips for following this plan?  General guidelines  Avoid eating more than 2,300 mg (milligrams) of salt (sodium) a day. If you have hypertension, you may need to reduce your sodium intake to 1,500 mg a day.  Limit alcohol intake to no more than 1 drink a day for nonpregnant women and 2 drinks a day for men. One drink equals 12 oz of beer, 5 oz of wine, or 1 oz of hard liquor.  Work with your health care provider to maintain a healthy body weight or to lose weight. Ask what an ideal weight is for you.  Get at least 30 minutes of exercise that causes your heart to beat faster (aerobic exercise) most days of the week. Activities may include walking, swimming, or biking.  Work with your health care provider or diet and nutrition specialist (dietitian) to adjust your eating plan to your individual calorie needs. Reading food labels   Check food labels for the amount of sodium per serving. Choose foods with less than 5 percent of the Daily Value of sodium. Generally, foods with less than 300 mg of sodium per serving fit into this eating plan.  To find whole grains, look for the word "whole" as the first word in the ingredient list. Shopping  Buy products labeled as "low-sodium" or "no salt added."  Buy fresh foods. Avoid canned foods and premade or frozen meals. Cooking  Avoid adding salt when cooking. Use salt-free seasonings or herbs instead of table salt or sea salt. Check with your health care provider or pharmacist before using salt substitutes.  Do not fry foods. Cook foods using healthy methods such as baking, boiling, grilling, and broiling instead.  Cook with  heart-healthy oils, such as olive, canola, soybean, or sunflower oil. Meal planning  Eat a balanced diet that includes: ? 5 or more servings of fruits and vegetables each day. At each meal, try to fill half of your plate with fruits and vegetables. ? Up to 6-8 servings of whole grains each day. ? Less than 6 oz of lean meat, poultry, or fish each day. A 3-oz serving of meat is about the same size as a deck of cards. One egg equals 1 oz. ? 2 servings of low-fat dairy each day. ? A serving of nuts, seeds, or beans 5 times each week. ? Heart-healthy fats. Healthy fats called Omega-3 fatty acids are found in foods such as flaxseeds and coldwater fish, like sardines, salmon, and mackerel.  Limit how much you eat of the following: ? Canned or prepackaged foods. ? Food that is high in trans fat, such as fried foods. ? Food that is high in saturated fat, such as fatty meat. ? Sweets, desserts, sugary drinks, and other foods with added sugar. ? Full-fat dairy products.  Do not salt foods before eating.  Try to eat at least 2 vegetarian meals each week.  Eat more home-cooked food and less restaurant, buffet, and fast food.  When eating at a restaurant, ask that your food be prepared with less salt or no salt, if possible. What foods are recommended? The items listed may not be a complete list. Talk with your dietitian about   what dietary choices are best for you. Grains Whole-grain or whole-wheat bread. Whole-grain or whole-wheat pasta. Brown rice. Oatmeal. Quinoa. Bulgur. Whole-grain and low-sodium cereals. Pita bread. Low-fat, low-sodium crackers. Whole-wheat flour tortillas. Vegetables Fresh or frozen vegetables (raw, steamed, roasted, or grilled). Low-sodium or reduced-sodium tomato and vegetable juice. Low-sodium or reduced-sodium tomato sauce and tomato paste. Low-sodium or reduced-sodium canned vegetables. Fruits All fresh, dried, or frozen fruit. Canned fruit in natural juice (without  added sugar). Meat and other protein foods Skinless chicken or turkey. Ground chicken or turkey. Pork with fat trimmed off. Fish and seafood. Egg whites. Dried beans, peas, or lentils. Unsalted nuts, nut butters, and seeds. Unsalted canned beans. Lean cuts of beef with fat trimmed off. Low-sodium, lean deli meat. Dairy Low-fat (1%) or fat-free (skim) milk. Fat-free, low-fat, or reduced-fat cheeses. Nonfat, low-sodium ricotta or cottage cheese. Low-fat or nonfat yogurt. Low-fat, low-sodium cheese. Fats and oils Soft margarine without trans fats. Vegetable oil. Low-fat, reduced-fat, or light mayonnaise and salad dressings (reduced-sodium). Canola, safflower, olive, soybean, and sunflower oils. Avocado. Seasoning and other foods Herbs. Spices. Seasoning mixes without salt. Unsalted popcorn and pretzels. Fat-free sweets. What foods are not recommended? The items listed may not be a complete list. Talk with your dietitian about what dietary choices are best for you. Grains Baked goods made with fat, such as croissants, muffins, or some breads. Dry pasta or rice meal packs. Vegetables Creamed or fried vegetables. Vegetables in a cheese sauce. Regular canned vegetables (not low-sodium or reduced-sodium). Regular canned tomato sauce and paste (not low-sodium or reduced-sodium). Regular tomato and vegetable juice (not low-sodium or reduced-sodium). Pickles. Olives. Fruits Canned fruit in a light or heavy syrup. Fried fruit. Fruit in cream or butter sauce. Meat and other protein foods Fatty cuts of meat. Ribs. Fried meat. Bacon. Sausage. Bologna and other processed lunch meats. Salami. Fatback. Hotdogs. Bratwurst. Salted nuts and seeds. Canned beans with added salt. Canned or smoked fish. Whole eggs or egg yolks. Chicken or turkey with skin. Dairy Whole or 2% milk, cream, and half-and-half. Whole or full-fat cream cheese. Whole-fat or sweetened yogurt. Full-fat cheese. Nondairy creamers. Whipped toppings.  Processed cheese and cheese spreads. Fats and oils Butter. Stick margarine. Lard. Shortening. Ghee. Bacon fat. Tropical oils, such as coconut, palm kernel, or palm oil. Seasoning and other foods Salted popcorn and pretzels. Onion salt, garlic salt, seasoned salt, table salt, and sea salt. Worcestershire sauce. Tartar sauce. Barbecue sauce. Teriyaki sauce. Soy sauce, including reduced-sodium. Steak sauce. Canned and packaged gravies. Fish sauce. Oyster sauce. Cocktail sauce. Horseradish that you find on the shelf. Ketchup. Mustard. Meat flavorings and tenderizers. Bouillon cubes. Hot sauce and Tabasco sauce. Premade or packaged marinades. Premade or packaged taco seasonings. Relishes. Regular salad dressings. Where to find more information:  National Heart, Lung, and Blood Institute: www.nhlbi.nih.gov  American Heart Association: www.heart.org Summary  The DASH eating plan is a healthy eating plan that has been shown to reduce high blood pressure (hypertension). It may also reduce your risk for type 2 diabetes, heart disease, and stroke.  With the DASH eating plan, you should limit salt (sodium) intake to 2,300 mg a day. If you have hypertension, you may need to reduce your sodium intake to 1,500 mg a day.  When on the DASH eating plan, aim to eat more fresh fruits and vegetables, whole grains, lean proteins, low-fat dairy, and heart-healthy fats.  Work with your health care provider or diet and nutrition specialist (dietitian) to adjust your eating plan to your   individual calorie needs. This information is not intended to replace advice given to you by your health care provider. Make sure you discuss any questions you have with your health care provider. Document Revised: 06/02/2017 Document Reviewed: 06/13/2016 Elsevier Patient Education  2020 Elsevier Inc. Carbohydrate Counting for Diabetes Mellitus, Adult  Carbohydrate counting is a method of keeping track of how many carbohydrates you  eat. Eating carbohydrates naturally increases the amount of sugar (glucose) in the blood. Counting how many carbohydrates you eat helps keep your blood glucose within normal limits, which helps you manage your diabetes (diabetes mellitus). It is important to know how many carbohydrates you can safely have in each meal. This is different for every person. A diet and nutrition specialist (registered dietitian) can help you make a meal plan and calculate how many carbohydrates you should have at each meal and snack. Carbohydrates are found in the following foods:  Grains, such as breads and cereals.  Dried beans and soy products.  Starchy vegetables, such as potatoes, peas, and corn.  Fruit and fruit juices.  Milk and yogurt.  Sweets and snack foods, such as cake, cookies, candy, chips, and soft drinks. How do I count carbohydrates? There are two ways to count carbohydrates in food. You can use either of the methods or a combination of both. Reading "Nutrition Facts" on packaged food The "Nutrition Facts" list is included on the labels of almost all packaged foods and beverages in the [place] It includes:  The serving size.  Information about nutrients in each serving, including the grams (g) of carbohydrate per serving. To use the "Nutrition Facts":  Decide how many servings you will have.  Multiply the number of servings by the number of carbohydrates per serving.  The resulting number is the total amount of carbohydrates that you will be having. Learning standard serving sizes of other foods When you eat carbohydrate foods that are not packaged or do not include "Nutrition Facts" on the label, you need to measure the servings in order to count the amount of carbohydrates:  Measure the foods that you will eat with a food scale or measuring cup, if needed.  Decide how many standard-size servings you will eat.  Multiply the number of servings by 15. Most carbohydrate-rich foods have  about 15 g of carbohydrates per serving. ? For example, if you eat 8 oz (170 g) of strawberries, you will have eaten 2 servings and 30 g of carbohydrates (2 servings x 15 g = 30 g).  For foods that have more than one food mixed, such as soups and casseroles, you must count the carbohydrates in each food that is included. The following list contains standard serving sizes of common carbohydrate-rich foods. Each of these servings has about 15 g of carbohydrates:   hamburger bun or  English muffin.   oz (15 mL) syrup.   oz (14 g) jelly.  1 slice of bread.  1 six-inch tortilla.  3 oz (85 g) cooked rice or pasta.  4 oz (113 g) cooked dried beans.  4 oz (113 g) starchy vegetable, such as peas, corn, or potatoes.  4 oz (113 g) hot cereal.  4 oz (113 g) mashed potatoes or  of a large baked potato.  4 oz (113 g) canned or frozen fruit.  4 oz (120 mL) fruit juice.  4-6 crackers.  6 chicken nuggets.  6 oz (170 g) unsweetened dry cereal.  6 oz (170 g) plain fat-free yogurt or yogurt sweetened with   artificial sweeteners.  8 oz (240 mL) milk.  8 oz (170 g) fresh fruit or one small piece of fruit.  24 oz (680 g) popped popcorn. Example of carbohydrate counting Sample meal  3 oz (85 g) chicken breast.  6 oz (170 g) brown rice.  4 oz (113 g) corn.  8 oz (240 mL) milk.  8 oz (170 g) strawberries with sugar-free whipped topping. Carbohydrate calculation 1. Identify the foods that contain carbohydrates: ? Rice. ? Corn. ? Milk. ? Strawberries. 2. Calculate how many servings you have of each food: ? 2 servings rice. ? 1 serving corn. ? 1 serving milk. ? 1 serving strawberries. 3. Multiply each number of servings by 15 g: ? 2 servings rice x 15 g = 30 g. ? 1 serving corn x 15 g = 15 g. ? 1 serving milk x 15 g = 15 g. ? 1 serving strawberries x 15 g = 15 g. 4. Add together all of the amounts to find the total grams of carbohydrates eaten: ? 30 g + 15 g + 15 g + 15  g = 75 g of carbohydrates total. Summary  Carbohydrate counting is a method of keeping track of how many carbohydrates you eat.  Eating carbohydrates naturally increases the amount of sugar (glucose) in the blood.  Counting how many carbohydrates you eat helps keep your blood glucose within normal limits, which helps you manage your diabetes.  A diet and nutrition specialist (registered dietitian) can help you make a meal plan and calculate how many carbohydrates you should have at each meal and snack. This information is not intended to replace advice given to you by your health care provider. Make sure you discuss any questions you have with your health care provider. Document Revised: 01/12/2017 Document Reviewed: 12/02/2015 Elsevier Patient Education  2020 Elsevier Inc.  

## 2019-08-19 ENCOUNTER — Other Ambulatory Visit: Payer: Self-pay

## 2019-08-19 ENCOUNTER — Ambulatory Visit: Payer: Self-pay | Admitting: Pharmacy Technician

## 2019-08-19 DIAGNOSIS — Z79899 Other long term (current) drug therapy: Secondary | ICD-10-CM

## 2019-08-19 LAB — HM DIABETES EYE EXAM

## 2019-08-20 NOTE — Progress Notes (Signed)
Patient agreed to all terms of the [facility] contract.  Patient approved to receive medication assistance at [facility] as long as eligibility criteria continues to be met.    Provided patient with community resource material based on his particular needs.    [name] J. Alexina Niccoli Care Manager [facility]  

## 2019-08-21 ENCOUNTER — Other Ambulatory Visit: Payer: Self-pay

## 2019-08-27 ENCOUNTER — Ambulatory Visit: Payer: Self-pay | Admitting: Family Medicine

## 2019-09-04 ENCOUNTER — Ambulatory Visit: Payer: Self-pay | Admitting: Gerontology

## 2019-09-05 ENCOUNTER — Ambulatory Visit: Payer: Self-pay | Admitting: Licensed Clinical Social Worker

## 2019-09-05 ENCOUNTER — Other Ambulatory Visit: Payer: Self-pay

## 2019-09-05 DIAGNOSIS — F341 Dysthymic disorder: Secondary | ICD-10-CM

## 2019-09-05 DIAGNOSIS — F411 Generalized anxiety disorder: Secondary | ICD-10-CM

## 2019-09-05 NOTE — BH Specialist Note (Signed)
Integrated Behavioral Health Comprehensive Clinical Assessment  MRN: [id number] Name: [name]   Type of Service: Integrated Behavioral Health-Individual Interpretor: No. Interpretor Name and Language:   PRESENTING CONCERNS: [name] is a 59 y.o. male accompanied by himself.[name] was referred to [facility] clinician for mental health.  Previous mental health services Have you ever been treated for a mental health problem? Yes If "Yes", when were you treated and whom did you see? [name] was previouslly treated by Dr. [name] who prescribed Buspar 10 mg three times a day and Bupropion 100 mg twice a day until he lost his health insurance. Per the patient, these medications were effective at treating his symptoms.  Have you ever been hospitalized for mental health treatment? Yes Have you ever been treated for any of the following? Past Psychiatric History/Hospitalization(s): Anxiety: Yes Nobel describes feeling anxious, nervous, and on edge nearly everyday, not being able to stop or control his worrying, worrying too much about different things, difficulty relaxing, restlessness, and feeling afraid as if something awful might happen.  Bipolar Disorder: Negative Depression: Yes Caster describes feeling down and depressed more than half the days, loss of interest in previously enjoyed activities, insomnia, poor appetite, feeling bad about himself, difficulty concentrating, and restlessness. He denies suicidal and homicidal thoughts.  Mania: Negative Psychosis: Negative Schizophrenia: Negative Personality Disorder: Negative Hospitalization for psychiatric illness: Yes [name] was hospitalized four times at [facility] starting in 1999 for depression and anxiety. He explains that he went to this hospital voluntarily because that is where you went when you did not have health insurance.  History of Electroconvulsive Shock Therapy: Negative Prior Suicide  Attempts: No Have you ever had thoughts of harming yourself or others or attempted suicide? No plan to harm self or others  Medical history  has a past medical history of Arthritis, Blood clot in vein, Diabetes mellitus, DVT (deep venous thrombosis) ([place]), Hypertension, and Neuropathy. Primary Care Physician: Langston Reusing, NP Date of last physical exam:  Allergies:  Allergies  Allergen Reactions  . Erythromycin Base Anxiety  . Xanax [Alprazolam] Other (See Comments)    Tachycardia  . Benzodiazepines     PT STATES HE CANNOT TAKE THESE BECAUSE THEY ALMOST KILLED HIM  . Methocarbamol Anxiety and Other (See Comments)    Stomach cramping, weakness  . Sulfa Antibiotics Nausea And Vomiting and Other (See Comments)    Cramping in stomach and hyperventilate.    Current medications:  Outpatient Encounter Medications as of 09/05/2019  Medication Sig  . acetaminophen (TYLENOL) 325 MG tablet Take 650 mg by mouth every 6 (six) hours as needed.  Marland Kitchen aspirin EC 81 MG tablet Take 1 tablet (81 mg total) by mouth daily.  Marland Kitchen atorvastatin (LIPITOR) 40 MG tablet Take 1 tablet (40 mg total) by mouth daily. (Patient not taking: Reported on 08/15/2019)  . blood glucose meter kit and supplies KIT Dispense based on patient and insurance preference. Use up to four times daily as directed. (FOR ICD-9 250.00, 250.01).  Marland Kitchen buPROPion (WELLBUTRIN) 100 MG tablet Take 1 tablet (100 mg total) by mouth 2 (two) times daily. (Patient not taking: Reported on 08/15/2019)  . busPIRone (BUSPAR) 10 MG tablet Take 1 tablet (10 mg total) by mouth 3 (three) times daily. (Patient not taking: Reported on 08/15/2019)  . gabapentin (NEURONTIN) 100 MG capsule Take 1 capsule (100 mg total) by mouth at bedtime.  Marland Kitchen glimepiride (AMARYL) 4 MG tablet Take 1 tablet (4 mg total)  by mouth daily with breakfast.  . glucose blood (TRUE METRIX BLOOD GLUCOSE TEST) test strip Use as instructed (Patient not taking: Reported on 09/04/2018)  . metFORMIN  (GLUCOPHAGE) 1000 MG tablet Take 1 tablet (1,000 mg total) by mouth 2 (two) times daily with a meal.  . metoprolol tartrate (LOPRESSOR) 50 MG tablet Take 1 tablet (50 mg total) by mouth 2 (two) times daily.   No facility-administered encounter medications on file as of 09/05/2019.   Have you ever had any serious medication reactions? Yes- Alvis has had adverse drug reactions to Erythromycin base, Xanax, Benzodiazepines, Methocarbamol, and sulfa antibiotics.  Is there any history of mental health problems or substance abuse in your family? Yes- There is a history of mental illness and substance abuse in his family. He reports that his dad had a history of depression and drank alcohol heavily on the weekends. His parents seperated due to dad's alcoholism. He notes that his mom had anxiety and depression after she separated from his dad.  Has anyone in your family been hospitalized for mental health treatment? No  Social/family history Who lives in your current household? [name] lives with a roommate. He has been married and divorced twice. He has no children.  What is your family of origin, childhood history?  Where were you born? [name] was born in [place].  Where did you grow up[place] How many different homes have you lived in? A handful.  Describe your childhood:  Do you have siblings, step/half siblings? Yes- [name] is one of six kids. What are their names, relation, sex, age? He is the oldest. He has three brothers and two sisters. One of brothers passed. He has no contact with his family, they are not supportive.  Are your parents separated or divorced? Yes- His parents separated due to stress and dad's alcoholism. What are your social supports? Not much of a social life due to feeling physically unable. Relaxes on front porch, goes walking, house chores. He has a handful of friends.   Education How many grades have you completed? [employer] graduated high school. He  applied to college but could not afford to go.  Did you have any problems in school? No  Employment/financial issues Veldon is not able to work at the moment due to the pain in his hands. He has applied for disability in the past.   Sleep Usual bedtime He notes that he gets about three to four hours at most on a good night.  Sleeping arrangements:  Problems with snoring: Not known Obstructive sleep apnea is not a concern. Problems with nightmares: No Problems with night terrors: No Problems with sleepwalking: No  Trauma/Abuse history Have you ever experienced or been exposed to any form of abuse? No Have you ever experienced or been exposed to something traumatic? No  Substance use Do you use alcohol, nicotine or caffeine? Jobanny denies drinking alcohol.  How old were you when you first tasted alcohol? Teens. Have you ever used illicit drugs or abused prescription medications? Areli denies using or abusing illicit drugs or prescription medications. He has not previously been in substance abuse treatment.   Mental status General appearance/Behavior: Absent due to assessment conducted over the phone.  Eye contact:  Unable to assess due to assessment conducted over the phone. Motor behavior:  Unable to assess due to assessment conducted over the phone. Speech: Normal Level of consciousness: Alert Mood: Euthymic Affect: Appropriate Anxiety level: Moderate Thought process: Coherent Thought  content: WNL Perception: Normal Judgment: Good Insight: Present  Diagnosis No diagnosis found.  GOALS ADDRESSED: Patient will reduce symptoms of: anxiety and depression and increase knowledge and/or ability of: coping skills, healthy habits, self-management skills and stress reduction and also: Increase healthy adjustment to current life circumstances              INTERVENTIONS: Interventions utilized: Clinical comprehensive assessment Standardized Assessments completed: GAD-7 and PHQ  9   ASSESSMENT/OUTCOME:  Wilman Tucker is a 59 year old Caucasian male who presents today for a mental health assessment via phone and was referred by Carlyon Shadow, NP at [facility]. Rhyker has a history of anxiety and depression that began many years ago. He was previouslly treated by Dr. [name] who prescribed Buspar 10 mg three times a day and Bupropion 100 mg twice a day until he lost his health insurance. Per the patient, these medications were effective at treating his symptoms. He previously saw a therapist for several sessions in [facility] until he lost his transportation, health insurance, and job. He was hospitalized voluntarily for mental illness at [facility] four time and the first was in 1999. He denies suicidal and homicidal thoughts. He denies using or abusing alcohol or other drugs. He has not previously been in substance abuse treatment.   Minh is a newly established patient at [facility]. 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 He has had adverse drug reactions to Erythromycin base, Xanax, Benzodiazepines, Methocarbamol, and sulfa antibiotics. He has had an appendectomy and vein ligation and stripping of his left leg.   Mannix lives with a roommate. He is unemployed. He previously worked many years in [employer] until the decline in his health began. He has no children. He has been married twice. He has little to no support. He does not communicate with his family.  There is a history of mental health and substance abuse in the family. He reports that his dad had a history of depression and drank alcohol heavily on the weekends. His parents seperated due to dad's alcoholism. He notes that his mom had anxiety and depression after she separated from his dad.   PLAN: Follow up in one to two weeks or earlier if  needed. Scheduled next visit: Case consultation with Dr. Octavia Heir, MD, psychiatric consultant on Tuesday March 9th @ 9 am to discuss treatment recommendations. Recommendation for Alaric to re start Bupropion 100 mg twice a day and Buspar 10 mg three times a day.   [place] Work

## 2019-09-10 ENCOUNTER — Other Ambulatory Visit: Payer: Self-pay

## 2019-09-10 DIAGNOSIS — F4323 Adjustment disorder with mixed anxiety and depressed mood: Secondary | ICD-10-CM

## 2019-09-10 DIAGNOSIS — F17200 Nicotine dependence, unspecified, uncomplicated: Secondary | ICD-10-CM

## 2019-09-10 MED ORDER — BUPROPION HCL 100 MG PO TABS: 100.0000 mg | ORAL_TABLET | Freq: Two times a day (BID) | ORAL | 1 refills | Status: DC

## 2019-09-10 MED ORDER — BUSPIRONE HCL 10 MG PO TABS: 10.0000 mg | ORAL_TABLET | Freq: Three times a day (TID) | ORAL | 1 refills | Status: DC

## 2019-09-11 ENCOUNTER — Other Ambulatory Visit: Payer: Self-pay

## 2019-09-11 ENCOUNTER — Ambulatory Visit: Payer: Self-pay | Admitting: Gerontology

## 2019-09-11 ENCOUNTER — Encounter: Payer: Self-pay | Admitting: Gerontology

## 2019-09-11 VITALS — BP 146/99 | HR 91 | Ht 71.0 in | Wt 204.0 lb

## 2019-09-11 DIAGNOSIS — I10 Essential (primary) hypertension: Secondary | ICD-10-CM

## 2019-09-11 DIAGNOSIS — E1149 Type 2 diabetes mellitus with other diabetic neurological complication: Secondary | ICD-10-CM

## 2019-09-11 DIAGNOSIS — Z789 Other specified health status: Secondary | ICD-10-CM

## 2019-09-11 NOTE — Progress Notes (Signed)
Established Patient Office Visit  Subjective:  Patient ID: [name], male    DOB: 12-14-1960  Age: 59 y.o. MRN: [id number]  CC:  Chief Complaint  Patient presents with  . Diabetes  . Hypertension    HPI VIVIAN OKELLEY presents for follow up of type 2 diabetes mellitus. He missed his lab appointment, he states that he is compliant with his medications but doesn't take Lipitor and gabapentin because per patient, he states that his "living condition is very chaotic and he doesn't want to start any new medication". He reports that he checks his blood glucose "every couple of days". His blood glucose was checked during visit and it was 186 mg/dl. He denies hypoglycemic symptoms and states that he performs daily foot check. He denies chest pain, palpitation, light headedness, fever and chills. Overall, he states that his concerned about his living condition and he's hoping to find another accomodation. He offers no further complaint.  Past Medical History:  Diagnosis Date  . Arthritis   . Blood clot in vein   . Diabetes mellitus   . DVT (deep venous thrombosis) ([place])   . Hypertension   . Neuropathy     Past Surgical History:  Procedure Laterality Date  . APPENDECTOMY    . VEIN LIGATION AND STRIPPING     of left leg    Family History  Problem Relation Age of Onset  . Heart disease Mother   . Diabetes Father   . Heart disease Father     Social History   Socioeconomic History  . Marital status: Divorced    Spouse name: Not on file  . Number of children: Not on file  . Years of education: Not on file  . Highest education level: Not on file  Occupational History  . Not on file  Tobacco Use  . Smoking status: Current Every Day Smoker    Packs/day: 2.00    Years: 6.00    Pack years: 12.00    Types: Cigarettes  . Smokeless tobacco: Never Used  Substance and Sexual Activity  . Alcohol use: No  . Drug use: No  . Sexual activity: Not on file  Other Topics Concern  .  Not on file  Social History Narrative  . Not on file   Social Determinants of Health   Financial Resource Strain:   . Difficulty of Paying Living Expenses: Not on file  Food Insecurity:   . Worried About Charity fundraiser in the Last Year: Not on file  . Ran Out of Food in the Last Year: Not on file  Transportation Needs:   . Lack of Transportation (Medical): Not on file  . Lack of Transportation (Non-Medical): Not on file  Physical Activity:   . Days of Exercise per Week: Not on file  . Minutes of Exercise per Session: Not on file  Stress:   . Feeling of Stress : Not on file  Social Connections:   . Frequency of Communication with Friends and Family: Not on file  . Frequency of Social Gatherings with Friends and Family: Not on file  . Attends Religious Services: Not on file  . Active Member of Clubs or Organizations: Not on file  . Attends Archivist Meetings: Not on file  . Marital Status: Not on file  Intimate Partner Violence:   . Fear of Current or Ex-Partner: Not on file  . Emotionally Abused: Not on file  . Physically Abused: Not on file  .  Sexually Abused: Not on file    Outpatient Medications Prior to Visit  Medication Sig Dispense Refill  . aspirin EC 81 MG tablet Take 1 tablet (81 mg total) by mouth daily. 30 tablet 3  . buPROPion (WELLBUTRIN) 100 MG tablet Take 1 tablet (100 mg total) by mouth 2 (two) times daily. 60 tablet 1  . busPIRone (BUSPAR) 10 MG tablet Take 1 tablet (10 mg total) by mouth 3 (three) times daily. 90 tablet 1  . glimepiride (AMARYL) 4 MG tablet Take 1 tablet (4 mg total) by mouth daily with breakfast. 30 tablet 2  . metFORMIN (GLUCOPHAGE) 1000 MG tablet Take 1 tablet (1,000 mg total) by mouth 2 (two) times daily with a meal. 60 tablet 2  . metoprolol tartrate (LOPRESSOR) 50 MG tablet Take 1 tablet (50 mg total) by mouth 2 (two) times daily. 60 tablet 2  . acetaminophen (TYLENOL) 325 MG tablet Take 650 mg by mouth every 6 (six)  hours as needed.    Marland Kitchen atorvastatin (LIPITOR) 40 MG tablet Take 1 tablet (40 mg total) by mouth daily. (Patient not taking: Reported on 08/15/2019) 30 tablet 3  . blood glucose meter kit and supplies KIT Dispense based on patient and insurance preference. Use up to four times daily as directed. (FOR ICD-9 250.00, 250.01). 1 each 0  . gabapentin (NEURONTIN) 100 MG capsule Take 1 capsule (100 mg total) by mouth at bedtime. (Patient not taking: Reported on 09/11/2019) 30 capsule 0  . glucose blood (TRUE METRIX BLOOD GLUCOSE TEST) test strip Use as instructed (Patient not taking: Reported on 09/04/2018) 100 each 12   No facility-administered medications prior to visit.    Allergies  Allergen Reactions  . Erythromycin Base Anxiety  . Xanax [Alprazolam] Other (See Comments)    Tachycardia  . Benzodiazepines     PT STATES HE CANNOT TAKE THESE BECAUSE THEY ALMOST KILLED HIM  . Methocarbamol Anxiety and Other (See Comments)    Stomach cramping, weakness  . Sulfa Antibiotics Nausea And Vomiting and Other (See Comments)    Cramping in stomach and hyperventilate.     ROS Review of Systems  Constitutional: Negative.   Eyes: Negative.   Respiratory: Negative.   Cardiovascular: Negative.   Endocrine: Negative.   Neurological: Negative.   Psychiatric/Behavioral: Negative.       Objective:    Physical Exam  Constitutional: He is oriented to person, place, and time. He appears well-developed.  HENT:  Head: Normocephalic and atraumatic.  Eyes: Pupils are equal, round, and reactive to light. EOM are normal.  Cardiovascular: Normal rate and regular rhythm.  Pulmonary/Chest: Effort normal and breath sounds normal.  Neurological: He is alert and oriented to person, place, and time.  Psychiatric: He has a normal mood and affect. His behavior is normal. Judgment and thought content normal.    BP (!) 146/99 (BP Location: Left Arm, Patient Position: Sitting)   Pulse 91   Ht '5\' 11"'  (1.803 m)   Wt  204 lb (92.5 kg)   SpO2 96%   BMI 28.45 kg/m  Wt Readings from Last 3 Encounters:  09/11/19 204 lb (92.5 kg)  08/15/19 203 lb 3.2 oz (92.2 kg)  05/23/19 195 lb (88.5 kg)     Health Maintenance Due  Topic Date Due  . Hepatitis C Screening  1960-11-16  . HIV Screening  07/29/1974  . TETANUS/TDAP  07/29/1978  . COLONOSCOPY  07/29/2009  . HEMOGLOBIN A1C  01/14/2019  . URINE MICROALBUMIN  07/17/2019  There are no preventive care reminders to display for this patient.  Lab Results  Component Value Date   TSH 2.60 06/30/2016   Lab Results  Component Value Date   WBC 11.3 (H) 07/29/2018   HGB 16.7 07/29/2018   HCT 48.4 07/29/2018   MCV 93.1 07/29/2018   PLT 53 (L) 07/29/2018   Lab Results  Component Value Date   NA 131 (L) 07/29/2018   K 4.1 07/29/2018   CO2 26 07/29/2018   GLUCOSE 199 (H) 07/29/2018   BUN 15 07/29/2018   CREATININE 0.83 07/29/2018   BILITOT 1.0 09/28/2017   ALKPHOS 61 09/28/2017   AST 16 09/28/2017   ALT 13 (L) 09/28/2017   PROT 7.6 09/28/2017   ALBUMIN 4.2 09/28/2017   CALCIUM 8.9 07/29/2018   ANIONGAP 7 07/29/2018   Lab Results  Component Value Date   CHOL 181 12/05/2012   Lab Results  Component Value Date   HDL 29 (L) 12/05/2012   Lab Results  Component Value Date   LDLCALC 82 12/05/2012   Lab Results  Component Value Date   TRIG 351 (H) 12/05/2012   Lab Results  Component Value Date   CHOLHDL 6.2 12/05/2012   Lab Results  Component Value Date   HGBA1C 8.1 (A) 07/16/2018      Assessment & Plan:   1. Needs assistance with community resources - He has transportation problems and was provided with bus pass. He was advised to follow up with [facility]. [facility] LCS[facility].  2. Essential hypertension - His blood pressure was elevated during visit, he was advised to continue on current treatment regimen. -Low salt DASH diet -Take medications regularly on time -Exercise regularly as tolerated -Check blood pressure at least once  a week at home or a nearby pharmacy and record -Goal is less than 140/90 and normal blood pressure is less than 120/80    3. Type 2 diabetes mellitus with other neurologic complication, without long-term current use of insulin ([place]) - He was encouraged to come to his lab appointment. He will continue on current treatment regimen. He was advised to check his blood glucose at least daily, and the goal for his fasting blood glucose should be between 80-130 mg/dl. He was advised to continue on low carb/non concentrated sweet diet.    Follow-up: Return in about 4 weeks (around 10/09/2019), or if symptoms worsen or fail to improve.    [place]ia Berkey Jerold Coombe, NP

## 2019-09-11 NOTE — Patient Instructions (Signed)

## 2019-09-18 ENCOUNTER — Other Ambulatory Visit: Payer: Self-pay

## 2019-09-18 DIAGNOSIS — Z7689 Persons encountering health services in other specified circumstances: Secondary | ICD-10-CM

## 2019-09-19 ENCOUNTER — Encounter: Payer: Self-pay | Admitting: Licensed Clinical Social Worker

## 2019-09-19 ENCOUNTER — Other Ambulatory Visit: Payer: Self-pay

## 2019-09-19 ENCOUNTER — Ambulatory Visit: Payer: Self-pay | Admitting: Licensed Clinical Social Worker

## 2019-09-19 DIAGNOSIS — F411 Generalized anxiety disorder: Secondary | ICD-10-CM

## 2019-09-19 DIAGNOSIS — F341 Dysthymic disorder: Secondary | ICD-10-CM

## 2019-09-19 LAB — URINALYSIS
Bilirubin, UA: NEGATIVE
Ketones, UA: NEGATIVE
Leukocytes,UA: NEGATIVE
Nitrite, UA: NEGATIVE
Protein,UA: NEGATIVE
RBC, UA: NEGATIVE
Specific Gravity, UA: 1.011 (ref 1.005–1.030)
Urobilinogen, Ur: 0.2 mg/dL (ref 0.2–1.0)
pH, UA: 5 (ref 5.0–7.5)

## 2019-09-19 LAB — CBC WITH DIFFERENTIAL/PLATELET
Basophils Absolute: 0.1 10*3/uL (ref 0.0–0.2)
Basos: 1 %
EOS (ABSOLUTE): 0.4 10*3/uL (ref 0.0–0.4)
Eos: 4 %
Hematocrit: 48.6 % (ref 37.5–51.0)
Hemoglobin: 16.8 g/dL (ref 13.0–17.7)
Immature Grans (Abs): 0 10*3/uL (ref 0.0–0.1)
Immature Granulocytes: 0 %
Lymphocytes Absolute: 1.6 10*3/uL (ref 0.7–3.1)
Lymphs: 17 %
MCH: 31.5 pg (ref 26.6–33.0)
MCHC: 34.6 g/dL (ref 31.5–35.7)
MCV: 91 fL (ref 79–97)
Monocytes Absolute: 0.7 10*3/uL (ref 0.1–0.9)
Monocytes: 7 %
Neutrophils Absolute: 6.3 10*3/uL (ref 1.4–7.0)
Neutrophils: 71 %
Platelets: 184 10*3/uL (ref 150–450)
RBC: 5.33 x10E6/uL (ref 4.14–5.80)
RDW: 11.8 % (ref 11.6–15.4)
WBC: 9 10*3/uL (ref 3.4–10.8)

## 2019-09-19 LAB — MICROALBUMIN / CREATININE URINE RATIO
Creatinine, Urine: 55.5 mg/dL
Microalb/Creat Ratio: 42 mg/g creat — ABNORMAL HIGH (ref 0–29)
Microalbumin, Urine: 23.4 ug/mL

## 2019-09-19 LAB — HEMOGLOBIN A1C
Est. average glucose Bld gHb Est-mCnc: 246 mg/dL
Hgb A1c MFr Bld: 10.2 % — ABNORMAL HIGH (ref 4.8–5.6)

## 2019-09-19 LAB — COMPREHENSIVE METABOLIC PANEL
ALT: 13 IU/L (ref 0–44)
AST: 15 IU/L (ref 0–40)
Albumin/Globulin Ratio: 2 (ref 1.2–2.2)
Albumin: 4.9 g/dL (ref 3.8–4.9)
Alkaline Phosphatase: 94 IU/L (ref 39–117)
BUN/Creatinine Ratio: 13 (ref 10–24)
BUN: 12 mg/dL (ref 8–27)
Bilirubin Total: 0.5 mg/dL (ref 0.0–1.2)
CO2: 25 mmol/L (ref 20–29)
Calcium: 9.7 mg/dL (ref 8.6–10.2)
Chloride: 99 mmol/L (ref 96–106)
Creatinine, Ser: 0.93 mg/dL (ref 0.76–1.27)
GFR calc Af Amer: 103 mL/min/{1.73_m2} (ref >59)
GFR calc non Af Amer: 89 mL/min/{1.73_m2} (ref >59)
Globulin, Total: 2.4 g/dL (ref 1.5–4.5)
Glucose: 155 mg/dL — ABNORMAL HIGH (ref 65–99)
Potassium: 4.8 mmol/L (ref 3.5–5.2)
Sodium: 138 mmol/L (ref 134–144)
Total Protein: 7.3 g/dL (ref 6.0–8.5)

## 2019-09-19 LAB — LIPID PANEL
Chol/HDL Ratio: 5.2 ratio — ABNORMAL HIGH (ref 0.0–5.0)
Cholesterol, Total: 191 mg/dL (ref 100–199)
HDL: 37 mg/dL — ABNORMAL LOW (ref >39)
LDL Chol Calc (NIH): 127 mg/dL — ABNORMAL HIGH (ref 0–99)
Triglycerides: 151 mg/dL — ABNORMAL HIGH (ref 0–149)
VLDL Cholesterol Cal: 27 mg/dL (ref 5–40)

## 2019-09-19 LAB — TSH: TSH: 2.07 u[IU]/mL (ref 0.450–4.500)

## 2019-09-19 NOTE — BH Specialist Note (Signed)
Integrated Behavioral Health Follow Up Visit  MRN: [id number] Name: ULYESS MUTO  Number of Integrated Behavioral Health Clinician visits: 1/6  Type of Service: Integrated Behavioral Health- Individual/Family Interpretor:No. Interpretor Name and Language: not applicable.   SUBJECTIVE: [name] is a 59 y.o. male accompanied by himself. Patient was referred by Hurman Horn NP for mental health. Patient reports the following symptoms/concerns: He explains that he needs assistance with getting a bus pass. He explains that he accidentally turned in his reverifications at [facility] instead of [facility]. He notes that he does not have the best memory. He asked about how he would get a stimulus check if he did not file taxes. He notes that he is able to get food from his roommates food stamp card. He notes that the weather and pain in his leg is not helping things. 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 He notes that he filled out a paper application for [employer] but due to the weather has not been able to walk up there to turn it in. He denies suicidal and homicidal thoughts.  Duration of problem:  Severity of problem: moderate  OBJECTIVE: Mood: Euthymic and Affect: Appropriate Risk of harm to self or others: No plan to harm self or others  LIFE CONTEXT: Family and Social:  School/Work: see above. Self-Care: see above. Life Changes: see above.  GOALS ADDRESSED: Patient will: 1.  Reduce symptoms of: anxiety  2.  Increase knowledge and/or ability of: self-management skills  3.  Demonstrate ability to: Increase healthy adjustment to current life circumstances  INTERVENTIONS: Interventions utilized:  Supportive Counseling was utilized by the clinician during today's follow up session. Clinician processed with the patient regarding how she has been doing since the last follow up  session. Clinician measured the patient's anxiety and depression on a numerical scale. Clinician explained to the patient that it sounds like he might have turned in his re verification in the wrong place or that he is missing documents that need to be turned in at [facility]. Clinician encouraged the patient to either call or go to the medication management clinic to verify what else he needs to turn in. Clinician explained to the patient that all he has to do to get a bus pass is ask at front desk. Clinician explained to the patient that she would place a note in the appointment notes that he needs two bus passes when he checks out for his next follow up appointment; one to ride the bus home and one to return to the clinic for his next follow up appointment. Clinician explained to the patient that he can fill out a non file tax return for not working in the last year and contact the stimulus check number. Clinician provided the patient with the contact information. Standardized Assessments completed: GAD-7 & PHQ 9  ASSESSMENT: Patient currently experiencing see above.   Patient may benefit from see above.  PLAN: 1. Follow up with behavioral health clinician on : two weeks or earlier if needed. 2. Behavioral recommendations: see above. 3. Referral(s): Integrated [facility] [facility] ([facility] Clinic) 4. "From scale of 1-10, how likely are you to follow plan?":  Althia Forts, LCSW

## 2019-10-02 ENCOUNTER — Encounter: Payer: Self-pay | Admitting: Gerontology

## 2019-10-02 ENCOUNTER — Other Ambulatory Visit: Payer: Self-pay

## 2019-10-02 ENCOUNTER — Ambulatory Visit: Payer: Self-pay | Admitting: Gerontology

## 2019-10-02 VITALS — BP 133/86 | HR 78 | Ht 71.0 in | Wt 205.0 lb

## 2019-10-02 DIAGNOSIS — E1110 Type 2 diabetes mellitus with ketoacidosis without coma: Secondary | ICD-10-CM

## 2019-10-02 DIAGNOSIS — E1149 Type 2 diabetes mellitus with other diabetic neurological complication: Secondary | ICD-10-CM

## 2019-10-02 DIAGNOSIS — F17200 Nicotine dependence, unspecified, uncomplicated: Secondary | ICD-10-CM

## 2019-10-02 DIAGNOSIS — I10 Essential (primary) hypertension: Secondary | ICD-10-CM

## 2019-10-02 DIAGNOSIS — E785 Hyperlipidemia, unspecified: Secondary | ICD-10-CM

## 2019-10-02 MED ORDER — ATORVASTATIN CALCIUM 40 MG PO TABS: 40.0000 mg | ORAL_TABLET | Freq: Every day | ORAL | 3 refills | Status: DC

## 2019-10-02 MED ORDER — GLIMEPIRIDE 4 MG PO TABS: 6.0000 mg | ORAL_TABLET | Freq: Every day | ORAL | 2 refills | Status: DC

## 2019-10-02 NOTE — Patient Instructions (Signed)
Carbohydrate Counting for Diabetes Mellitus, Adult  Carbohydrate counting is a method of keeping track of how many carbohydrates you eat. Eating carbohydrates naturally increases the amount of sugar (glucose) in the blood. Counting how many carbohydrates you eat helps keep your blood glucose within normal limits, which helps you manage your diabetes (diabetes mellitus). It is important to know how many carbohydrates you can safely have in each meal. This is different for every person. A diet and nutrition specialist (registered dietitian) can help you make a meal plan and calculate how many carbohydrates you should have at each meal and snack. Carbohydrates are found in the following foods:  Grains, such as breads and cereals.  Dried beans and soy products.  Starchy vegetables, such as potatoes, peas, and corn.  Fruit and fruit juices.  Milk and yogurt.  Sweets and snack foods, such as cake, cookies, candy, chips, and soft drinks. How do I count carbohydrates? There are two ways to count carbohydrates in food. You can use either of the methods or a combination of both. Reading "Nutrition Facts" on packaged food The "Nutrition Facts" list is included on the labels of almost all packaged foods and beverages in the [place] It includes:  The serving size.  Information about nutrients in each serving, including the grams (g) of carbohydrate per serving. To use the "Nutrition Facts":  Decide how many servings you will have.  Multiply the number of servings by the number of carbohydrates per serving.  The resulting number is the total amount of carbohydrates that you will be having. Learning standard serving sizes of other foods When you eat carbohydrate foods that are not packaged or do not include "Nutrition Facts" on the label, you need to measure the servings in order to count the amount of carbohydrates:  Measure the foods that you will eat with a food scale or measuring cup, if needed.   Decide how many standard-size servings you will eat.  Multiply the number of servings by 15. Most carbohydrate-rich foods have about 15 g of carbohydrates per serving. ? For example, if you eat 8 oz (170 g) of strawberries, you will have eaten 2 servings and 30 g of carbohydrates (2 servings x 15 g = 30 g).  For foods that have more than one food mixed, such as soups and casseroles, you must count the carbohydrates in each food that is included. The following list contains standard serving sizes of common carbohydrate-rich foods. Each of these servings has about 15 g of carbohydrates:   hamburger bun or  English muffin.   oz (15 mL) syrup.   oz (14 g) jelly.  1 slice of bread.  1 six-inch tortilla.  3 oz (85 g) cooked rice or pasta.  4 oz (113 g) cooked dried beans.  4 oz (113 g) starchy vegetable, such as peas, corn, or potatoes.  4 oz (113 g) hot cereal.  4 oz (113 g) mashed potatoes or  of a large baked potato.  4 oz (113 g) canned or frozen fruit.  4 oz (120 mL) fruit juice.  4-6 crackers.  6 chicken nuggets.  6 oz (170 g) unsweetened dry cereal.  6 oz (170 g) plain fat-free yogurt or yogurt sweetened with artificial sweeteners.  8 oz (240 mL) milk.  8 oz (170 g) fresh fruit or one small piece of fruit.  24 oz (680 g) popped popcorn. Example of carbohydrate counting Sample meal  3 oz (85 g) chicken breast.  6 oz (170 g)   brown rice.  4 oz (113 g) corn.  8 oz (240 mL) milk.  8 oz (170 g) strawberries with sugar-free whipped topping. Carbohydrate calculation 1. Identify the foods that contain carbohydrates: ? Rice. ? Corn. ? Milk. ? Strawberries. 2. Calculate how many servings you have of each food: ? 2 servings rice. ? 1 serving corn. ? 1 serving milk. ? 1 serving strawberries. 3. Multiply each number of servings by 15 g: ? 2 servings rice x 15 g = 30 g. ? 1 serving corn x 15 g = 15 g. ? 1 serving milk x 15 g = 15 g. ? 1 serving  strawberries x 15 g = 15 g. 4. Add together all of the amounts to find the total grams of carbohydrates eaten: ? 30 g + 15 g + 15 g + 15 g = 75 g of carbohydrates total. Summary  Carbohydrate counting is a method of keeping track of how many carbohydrates you eat.  Eating carbohydrates naturally increases the amount of sugar (glucose) in the blood.  Counting how many carbohydrates you eat helps keep your blood glucose within normal limits, which helps you manage your diabetes.  A diet and nutrition specialist (registered dietitian) can help you make a meal plan and calculate how many carbohydrates you should have at each meal and snack. This information is not intended to replace advice given to you by your health care provider. Make sure you discuss any questions you have with your health care provider. Document Revised: 01/12/2017 Document Reviewed: 12/02/2015 Elsevier Patient Education  2020 Else[employer] Inc. DASH Eating Plan DASH stands for "Dietary Approaches to Stop Hypertension." The DASH eating plan is a healthy eating plan that has been shown to reduce high blood pressure (hypertension). It may also reduce your risk for type 2 diabetes, heart disease, and stroke. The DASH eating plan may also help with weight loss. What are tips for following this plan?  General guidelines  Avoid eating more than 2,300 mg (milligrams) of salt (sodium) a day. If you have hypertension, you may need to reduce your sodium intake to 1,500 mg a day.  Limit alcohol intake to no more than 1 drink a day for nonpregnant women and 2 drinks a day for men. One drink equals 12 oz of beer, 5 oz of wine, or 1 oz of hard liquor.  Work with your health care provider to maintain a healthy body weight or to lose weight. Ask what an ideal weight is for you.  Get at least 30 minutes of exercise that causes your heart to beat faster (aerobic exercise) most days of the week. Activities may include walking, swimming, or  biking.  Work with your health care provider or diet and nutrition specialist (dietitian) to adjust your eating plan to your individual calorie needs. Reading food labels   Check food labels for the amount of sodium per serving. Choose foods with less than 5 percent of the Daily Value of sodium. Generally, foods with less than 300 mg of sodium per serving fit into this eating plan.  To find whole grains, look for the word "whole" as the first word in the ingredient list. Shopping  Buy products labeled as "low-sodium" or "no salt added."  Buy fresh foods. Avoid canned foods and premade or frozen meals. Cooking  Avoid adding salt when cooking. Use salt-free seasonings or herbs instead of table salt or sea salt. Check with your health care provider or pharmacist before using salt substitutes.  Do not   fry foods. Cook foods using healthy methods such as baking, boiling, grilling, and broiling instead.  Cook with heart-healthy oils, such as olive, canola, soybean, or sunflower oil. Meal planning  Eat a balanced diet that includes: ? 5 or more servings of fruits and vegetables each day. At each meal, try to fill half of your plate with fruits and vegetables. ? Up to 6-8 servings of whole grains each day. ? Less than 6 oz of lean meat, poultry, or fish each day. A 3-oz serving of meat is about the same size as a deck of cards. One egg equals 1 oz. ? 2 servings of low-fat dairy each day. ? A serving of nuts, seeds, or beans 5 times each week. ? Heart-healthy fats. Healthy fats called Omega-3 fatty acids are found in foods such as flaxseeds and coldwater fish, like sardines, salmon, and mackerel.  Limit how much you eat of the following: ? Canned or prepackaged foods. ? Food that is high in trans fat, such as fried foods. ? Food that is high in saturated fat, such as fatty meat. ? Sweets, desserts, sugary drinks, and other foods with added sugar. ? Full-fat dairy products.  Do not salt  foods before eating.  Try to eat at least 2 vegetarian meals each week.  Eat more home-cooked food and less restaurant, buffet, and fast food.  When eating at a restaurant, ask that your food be prepared with less salt or no salt, if possible. What foods are recommended? The items listed may not be a complete list. Talk with your dietitian about what dietary choices are best for you. Grains Whole-grain or whole-wheat bread. Whole-grain or whole-wheat pasta. Brown rice. Oatmeal. Quinoa. Bulgur. Whole-grain and low-sodium cereals. Pita bread. Low-fat, low-sodium crackers. Whole-wheat flour tortillas. Vegetables Fresh or frozen vegetables (raw, steamed, roasted, or grilled). Low-sodium or reduced-sodium tomato and vegetable juice. Low-sodium or reduced-sodium tomato sauce and tomato paste. Low-sodium or reduced-sodium canned vegetables. Fruits All fresh, dried, or frozen fruit. Canned fruit in natural juice (without added sugar). Meat and other protein foods Skinless chicken or turkey. Ground chicken or turkey. Pork with fat trimmed off. Fish and seafood. Egg whites. Dried beans, peas, or lentils. Unsalted nuts, nut butters, and seeds. Unsalted canned beans. Lean cuts of beef with fat trimmed off. Low-sodium, lean deli meat. Dairy Low-fat (1%) or fat-free (skim) milk. Fat-free, low-fat, or reduced-fat cheeses. Nonfat, low-sodium ricotta or cottage cheese. Low-fat or nonfat yogurt. Low-fat, low-sodium cheese. Fats and oils Soft margarine without trans fats. Vegetable oil. Low-fat, reduced-fat, or light mayonnaise and salad dressings (reduced-sodium). Canola, safflower, olive, soybean, and sunflower oils. Avocado. Seasoning and other foods Herbs. Spices. Seasoning mixes without salt. Unsalted popcorn and pretzels. Fat-free sweets. What foods are not recommended? The items listed may not be a complete list. Talk with your dietitian about what dietary choices are best for you. Grains Baked goods  made with fat, such as croissants, muffins, or some breads. Dry pasta or rice meal packs. Vegetables Creamed or fried vegetables. Vegetables in a cheese sauce. Regular canned vegetables (not low-sodium or reduced-sodium). Regular canned tomato sauce and paste (not low-sodium or reduced-sodium). Regular tomato and vegetable juice (not low-sodium or reduced-sodium). Pickles. Olives. Fruits Canned fruit in a light or heavy syrup. Fried fruit. Fruit in cream or butter sauce. Meat and other protein foods Fatty cuts of meat. Ribs. Fried meat. Bacon. Sausage. Bologna and other processed lunch meats. Salami. Fatback. Hotdogs. Bratwurst. Salted nuts and seeds. Canned beans with added salt.   Canned or smoked fish. Whole eggs or egg yolks. Chicken or turkey with skin. Dairy Whole or 2% milk, cream, and half-and-half. Whole or full-fat cream cheese. Whole-fat or sweetened yogurt. Full-fat cheese. Nondairy creamers. Whipped toppings. Processed cheese and cheese spreads. Fats and oils Butter. Stick margarine. Lard. Shortening. Ghee. Bacon fat. Tropical oils, such as coconut, palm kernel, or palm oil. Seasoning and other foods Salted popcorn and pretzels. Onion salt, garlic salt, seasoned salt, table salt, and sea salt. Worcestershire sauce. Tartar sauce. Barbecue sauce. Teriyaki sauce. Soy sauce, including reduced-sodium. Steak sauce. Canned and packaged gravies. Fish sauce. Oyster sauce. Cocktail sauce. Horseradish that you find on the shelf. Ketchup. Mustard. Meat flavorings and tenderizers. Bouillon cubes. Hot sauce and Tabasco sauce. Premade or packaged marinades. Premade or packaged taco seasonings. Relishes. Regular salad dressings. Where to find more information:  National Heart, Lung, and Blood Institute: www.nhlbi.nih.gov  American Heart Association: www.heart.org Summary  The DASH eating plan is a healthy eating plan that has been shown to reduce high blood pressure (hypertension). It may also reduce  your risk for type 2 diabetes, heart disease, and stroke.  With the DASH eating plan, you should limit salt (sodium) intake to 2,300 mg a day. If you have hypertension, you may need to reduce your sodium intake to 1,500 mg a day.  When on the DASH eating plan, aim to eat more fresh fruits and vegetables, whole grains, lean proteins, low-fat dairy, and heart-healthy fats.  Work with your health care provider or diet and nutrition specialist (dietitian) to adjust your eating plan to your individual calorie needs. This information is not intended to replace advice given to you by your health care provider. Make sure you discuss any questions you have with your health care provider. Document Revised: 06/02/2017 Document Reviewed: 06/13/2016 Elsevier Patient Education  2020 Elsevier Inc.  

## 2019-10-02 NOTE — Progress Notes (Signed)
Established Patient Office Visit  Subjective:  Patient ID: [name], male    DOB: 1961/03/11  Age: 59 y.o. MRN: [id number]  CC:  Chief Complaint  Patient presents with  . Hypertension    HPI [name] presents for follow up of T2DM, hypertension and lab review. His HgbA1c done on 09/18/2019 was 10.2% and he states that he's compliant with some of his medications and he's unable to comply with ADA diet due to limited resources and his living condition. He states that he has not been checking his blood glucose as he should, and can't remember the last time he checked it. It was checked during visit and it was 197 mg/dl.  He denies hypoglycemic symptoms and performs daily foot check. He states that he does not check his blood pressure at home but takes Metoprolol 50 mg bid. He smokes 2 packs of cigarette daily and admits the desire to quit. His Lipid panel done on 09/18/2019, Triglycerides was 151 mg/dl, HDL 37 mg/dl and LDL 127 mg/dl, he states that he can't afford 40 mg Atorvastatin from the local pharmacy. He denies chest pain, palpitation, light headedness and offers no further complaint.  Past Medical History:  Diagnosis Date  . Arthritis   . Blood clot in vein   . Diabetes mellitus   . DVT (deep venous thrombosis) ([place])   . Hypertension   . Neuropathy     Past Surgical History:  Procedure Laterality Date  . APPENDECTOMY    . VEIN LIGATION AND STRIPPING     of left leg    Family History  Problem Relation Age of Onset  . Heart disease Mother   . Diabetes Father   . Heart disease Father     Social History   Socioeconomic History  . Marital status: Divorced    Spouse name: Not on file  . Number of children: 0  . Years of education: Not on file  . Highest education level: Not on file  Occupational History  . Not on file  Tobacco Use  . Smoking status: Current Every Day Smoker    Packs/day: 2.00    Years: 6.00    Pack years: 12.00    Types: Cigarettes  .  Smokeless tobacco: Never Used  Substance and Sexual Activity  . Alcohol use: No  . Drug use: No  . Sexual activity: Not on file  Other Topics Concern  . Not on file  Social History Narrative   Social determinants screening completed 09/19/2019. HS      Patient was given resource for bus passes to link transit and non file tax for information for the stimulus check. He gets food stamps from his roommate. He plans to turn in a job application at [employer].    Social Determinants of Health   Financial Resource Strain: High Risk  . Difficulty of Paying Living Expenses: Hard  Food Insecurity: No Food Insecurity  . Worried About Charity fundraiser in the Last Year: Never true  . Ran Out of Food in the Last Year: Never true  Transportation Needs: Unmet Transportation Needs  . Lack of Transportation (Medical): Yes  . Lack of Transportation (Non-Medical): Yes  Physical Activity: Insufficiently Active  . Days of Exercise per Week: 3 days  . Minutes of Exercise per Session: 30 min  Stress: Stress Concern Present  . Feeling of Stress : To some extent  Social Connections: Severely Isolated  . Frequency of Communication with Friends and Family:  Never  . Frequency of Social Gatherings with Friends and Family: Never  . Attends Religious Services: Never  . Active Member of Clubs or Organizations: No  . Attends Archivist Meetings: Never  . Marital Status: Divorced  Human resources officer Violence: Not At Risk  . Fear of Current or Ex-Partner: No  . Emotionally Abused: No  . Physically Abused: No  . Sexually Abused: No    Outpatient Medications Prior to Visit  Medication Sig Dispense Refill  . buPROPion (WELLBUTRIN) 100 MG tablet Take 1 tablet (100 mg total) by mouth 2 (two) times daily. 60 tablet 1  . busPIRone (BUSPAR) 10 MG tablet Take 1 tablet (10 mg total) by mouth 3 (three) times daily. 90 tablet 1  . metFORMIN (GLUCOPHAGE) 1000 MG tablet Take 1 tablet (1,000 mg total) by mouth 2  (two) times daily with a meal. 60 tablet 2  . metoprolol tartrate (LOPRESSOR) 50 MG tablet Take 1 tablet (50 mg total) by mouth 2 (two) times daily. 60 tablet 2  . glimepiride (AMARYL) 4 MG tablet Take 1 tablet (4 mg total) by mouth daily with breakfast. 30 tablet 2  . acetaminophen (TYLENOL) 325 MG tablet Take 650 mg by mouth every 6 (six) hours as needed.    Marland Kitchen aspirin EC 81 MG tablet Take 1 tablet (81 mg total) by mouth daily. (Patient not taking: Reported on 10/02/2019) 30 tablet 3  . blood glucose meter kit and supplies KIT Dispense based on patient and insurance preference. Use up to four times daily as directed. (FOR ICD-9 250.00, 250.01). 1 each 0  . glucose blood (TRUE METRIX BLOOD GLUCOSE TEST) test strip Use as instructed (Patient not taking: Reported on 09/04/2018) 100 each 12  . atorvastatin (LIPITOR) 40 MG tablet Take 1 tablet (40 mg total) by mouth daily. (Patient not taking: Reported on 08/15/2019) 30 tablet 3  . gabapentin (NEURONTIN) 100 MG capsule Take 1 capsule (100 mg total) by mouth at bedtime. (Patient not taking: Reported on 09/11/2019) 30 capsule 0   No facility-administered medications prior to visit.    Allergies  Allergen Reactions  . Erythromycin Base Anxiety  . Xanax [Alprazolam] Other (See Comments)    Tachycardia  . Benzodiazepines     PT STATES HE CANNOT TAKE THESE BECAUSE THEY ALMOST KILLED HIM  . Methocarbamol Anxiety and Other (See Comments)    Stomach cramping, weakness  . Sulfa Antibiotics Nausea And Vomiting and Other (See Comments)    Cramping in stomach and hyperventilate.     ROS Review of Systems  Constitutional: Negative.   Eyes: Negative.   Respiratory: Negative.   Cardiovascular: Negative.   Endocrine: Negative.   Neurological: Negative.   Psychiatric/Behavioral: Negative.       Objective:    Physical Exam  Constitutional: He is oriented to person, place, and time. He appears well-developed.  HENT:  Head: Normocephalic and atraumatic.   Eyes: Pupils are equal, round, and reactive to light. EOM are normal.  Cardiovascular: Normal rate and regular rhythm.  Pulmonary/Chest: Effort normal and breath sounds normal.  Neurological: He is alert and oriented to person, place, and time.  Psychiatric: He has a normal mood and affect. His behavior is normal. Judgment and thought content normal.    BP 133/86 (BP Location: Left Arm, Patient Position: Sitting)   Pulse 78   Ht _0  (1.803 m)   Wt 205 lb (93 kg)   SpO2 95%   BMI 28.59 kg/m  Wt Readings from Last 3  Encounters:  10/02/19 205 lb (93 kg)  09/11/19 204 lb (92.5 kg)  08/15/19 203 lb 3.2 oz (92.2 kg)     Health Maintenance Due  Topic Date Due  . Hepatitis C Screening  Never done  . HIV Screening  Never done  . TETANUS/TDAP  Never done  . COLONOSCOPY  Never done    There are no preventive care reminders to display for this patient.  Lab Results  Component Value Date   TSH 2.070 09/18/2019   Lab Results  Component Value Date   WBC 9.0 09/18/2019   HGB 16.8 09/18/2019   HCT 48.6 09/18/2019   MCV 91 09/18/2019   PLT 184 09/18/2019   Lab Results  Component Value Date   NA 138 09/18/2019   K 4.8 09/18/2019   CO2 25 09/18/2019   GLUCOSE 155 (H) 09/18/2019   BUN 12 09/18/2019   CREATININE 0.93 09/18/2019   BILITOT 0.5 09/18/2019   ALKPHOS 94 09/18/2019   AST 15 09/18/2019   ALT 13 09/18/2019   PROT 7.3 09/18/2019   ALBUMIN 4.9 09/18/2019   CALCIUM 9.7 09/18/2019   ANIONGAP 7 07/29/2018   Lab Results  Component Value Date   CHOL 191 09/18/2019   Lab Results  Component Value Date   HDL 37 (L) 09/18/2019   Lab Results  Component Value Date   LDLCALC 127 (H) 09/18/2019   Lab Results  Component Value Date   TRIG 151 (H) 09/18/2019   Lab Results  Component Value Date   CHOLHDL 5.2 (H) 09/18/2019   Lab Results  Component Value Date   HGBA1C 10.2 (H) 09/18/2019      Assessment & Plan:   1. Essential hypertension - His blood  pressure is under control and he will continue on current medication regimen. He was advised to continue on DASH diet, and exercise as tolerated. His goal blood pressure is less than 140/90 and normal is less than 120/80.  2. Type 2 diabetes mellitus with other neurologic complication, without long-term current use of insulin (HCC) - His HgbA1c was 10.2% and his goal A1c is < 7%. His diabetes is not controlled due to non compliance. He declines insulin because of his living condition. He was strongly encouraged to check his blood glucose at least twice daily and his fasting readings should be between 80-130 mg/dl. His Glimepiride was increased to 6 mg daily and he will continue on metformin 1000 mg bid. He was advised to continue on low carb/non concentrated sweet diet. - glimepiride (AMARYL) 4 MG tablet; Take 1.5 tablets (6 mg total) by mouth daily with breakfast.  Dispense: 60 tablet; Refill: 2 - HgB A1c; Future - Ambulatory referral to [facility]  3. Dyslipidemia - His ASCVD 10 year risk was 30.5%, and he was advised to pick up his [facility]. He will continue on 40 mg Atorvastatin, was educated on medication side effects and advised to notify clinic. - atorvastatin (LIPITOR) 40 MG tablet; Take 1 tablet (40 mg total) by mouth daily.  Dispense: 30 tablet; Refill: 3 - Low fat Diet, like low fat dairy products eg skimmed milk -Avoid any fried food -Regular exercise/walk -Goal for Total Cholesterol is less than 200 -Goal for bad cholesterol LDL is less than 70 -Goal for Good cholesterol HDL is more than 45 -Goal for Triglyceride is less than 150 - Lipid Profile; Future  4. Tobacco use disorder - He was strongly advised on smoking cessation and was provided with [place]  Quitline information.      Follow-up: Return in about 3 months (around 12/24/2019), or if symptoms worsen or fail to improve.    Chozen Latulippe Jerold Coombe, NP

## 2019-10-03 ENCOUNTER — Other Ambulatory Visit: Payer: Self-pay | Admitting: Gerontology

## 2019-10-03 ENCOUNTER — Ambulatory Visit: Payer: Self-pay | Admitting: Licensed Clinical Social Worker

## 2019-10-03 DIAGNOSIS — F341 Dysthymic disorder: Secondary | ICD-10-CM

## 2019-10-03 DIAGNOSIS — I10 Essential (primary) hypertension: Secondary | ICD-10-CM

## 2019-10-03 DIAGNOSIS — E1110 Type 2 diabetes mellitus with ketoacidosis without coma: Secondary | ICD-10-CM

## 2019-10-03 DIAGNOSIS — F411 Generalized anxiety disorder: Secondary | ICD-10-CM

## 2019-10-03 NOTE — BH Specialist Note (Signed)
Integrated Behavioral Health Follow Up Visit Via Phone  MRN: [id number] Name: [name]  Number of Integrated Behavioral Health Clinician visits: 2/6  Type of Service: Integrated Behavioral Health- Individual/Family Interpretor:No. Interpretor Name and Language: not applicable.  SUBJECTIVE: [name] is a 59 y.o. male accompanied by himself. Patient was referred by [name] for mental health. Patient reports the following symptoms/concerns: He notes that he has yet to get up to the pharmacy due to issues with transportation. 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 He notes that this work will not be consistent but will be from time to time. He notes that his anxiety is alright and tries not to think about much anymore. He notes that he works, eats, and then goes to bed most days. He explains that he is working on trying to get his hemoglobin A 1 C down. He denies suicidal and homicidal thoughts.  Duration of problem: ; Severity of problem: mild  OBJECTIVE: Mood: Euthymic and Affect: Appropriate Risk of harm to self or others: No plan to harm self or others  LIFE CONTEXT: Family and Social: see above. School/Work: see above. Self-Care: see above. Life Changes: see above.  GOALS ADDRESSED: Patient will: 1.  Reduce symptoms of: stress  2.  Increase knowledge and/or ability of: coping skills and healthy habits  3.  Demonstrate ability to: Increase healthy adjustment to current life circumstances  INTERVENTIONS: Interventions utilized:  Supportive Counseling was utilized by the clinician during today's follow up session. Clinician processed with the patient regarding how she has been doing since the last follow up session. Clinician measured the patient's anxiety and depression on a numerical scale. Clinician reminded the patient that there are bus passes at the clinic available for him to  utilize for transportation upon request to and from the clinic. Clinician explained to the patient that she thinks that he would function a whole lot better once he starts taking his psychotropic medications again. Standardized Assessments completed: GAD-7 and PHQ 9  ASSESSMENT: Patient currently experiencing see above.   Patient may benefit from see above.  PLAN: 1. Follow up with behavioral health clinician on :  2. Behavioral recommendations: see above. 3. Referral(s): Integrated [employer] (In Clinic) 4. "From scale of 1-10, how likely are you to follow plan?":   Althia Forts, LCSW

## 2019-10-24 ENCOUNTER — Ambulatory Visit: Payer: Self-pay | Admitting: Licensed Clinical Social Worker

## 2019-11-05 ENCOUNTER — Other Ambulatory Visit: Payer: Self-pay

## 2019-11-05 ENCOUNTER — Ambulatory Visit: Payer: Self-pay | Admitting: Licensed Clinical Social Worker

## 2019-11-05 DIAGNOSIS — F411 Generalized anxiety disorder: Secondary | ICD-10-CM

## 2019-11-05 DIAGNOSIS — F341 Dysthymic disorder: Secondary | ICD-10-CM

## 2019-11-05 NOTE — BH Specialist Note (Signed)
Integrated Behavioral Health Follow Up Visit via Phone  MRN: [id number] Name: [name]  Number of Integrated Behavioral Health Clinician visits: 3/6  Type of Service: Integrated Behavioral Health- Individual/Family Interpretor:No. Interpretor Name and Language: not applicable.   SUBJECTIVE: [name] is a 59 y.o. male accompanied by himself.  Patient was referred by  Patient reports the following symptoms/concerns: He notes that he was able to get a ride and pick up his medication at medication management. He explains that he switched his medications back to [facility]. He notes that he is not getting much work lately and its getting difficult to support himself. He notes that he is applying for food stamps. He explains that he will not take the medications because he is not getting proper rest, nutrition, or sleep so he does not feel comfortable taking the medications yet. He notes that his biggest issue is transportation when he was applying for disability and does not always have money to ride the bus. He describes being in pain a good portion of the time. He notes that he has not told Lanora Manis at the clinic about what is going on with his pain. He denies suicidal and homicidal thoughts.  Duration of problem: ; Severity of problem: moderate  OBJECTIVE: Mood: Euthymic and Affect: Appropriate Risk of harm to self or others: No plan to harm self or others  LIFE CONTEXT: Family and Social: see above.  School/Work: see above. Self-Care: see above. Life Changes: see above.  GOALS ADDRESSED: Patient will: 1.  Reduce symptoms of: stress  2.  Increase knowledge and/or ability of: coping skills and self-management skills  3.  Demonstrate ability to: Increase healthy adjustment to current life circumstances  INTERVENTIONS: Interventions utilized:  Solution-Focused Strategies was utilized by the clinician during today's follow up session. Clinician processed with the patient regarding  how he has been doing since the last follow up session. Clinician measured the patient;s anxiety and depression on a numerical scale. Clinician utilized reflective listening encouraging the patient to ventilate his feelings towards his current situation. Clinician explained to the patient that in terms of his pain, if he does not let his doctor know what is happening with him then she cannot address it and provide him treatment. Clinician utilized motivational interviewing explaining to the patient that it seems like going into the hospital is preventable but he has to communicate his symptoms and let his doctor know. Clinician explained to the patient that it seems like he started taking the psychotropic medications again that he would have a clear head to be able to handle some of the things that he needs to do and find the motivation. Clinician explained to the patient that its important that he try to take care of himself and ask for help when he needs it.  Standardized Assessments completed: GAD-7 and PHQ 9  ASSESSMENT: Patient currently experiencing see above.   Patient may benefit from see above.  PLAN: 1. Follow up with behavioral health clinician on :  2. Behavioral recommendations: see above. 3. Referral(s): Integrated [employer] (In Clinic) 4. "From scale of 1-10, how likely are you to follow plan?":   Althia Forts, LCSW

## 2019-11-07 ENCOUNTER — Ambulatory Visit: Payer: Self-pay | Admitting: Gerontology

## 2019-11-19 ENCOUNTER — Ambulatory Visit: Payer: Self-pay | Admitting: Gerontology

## 2019-11-19 ENCOUNTER — Other Ambulatory Visit: Payer: Self-pay

## 2019-11-20 ENCOUNTER — Other Ambulatory Visit: Payer: Self-pay

## 2019-11-20 ENCOUNTER — Emergency Department
Admission: EM | Admit: 2019-11-20 | Discharge: 2019-11-21 | Disposition: A | Discharge status: Home / Self Care | Payer: Self-pay | Attending: Emergency Medicine | Admitting: Emergency Medicine

## 2019-11-20 DIAGNOSIS — Z5321 Procedure and treatment not carried out due to patient leaving prior to being seen by health care provider: Secondary | ICD-10-CM | POA: Insufficient documentation

## 2019-11-20 DIAGNOSIS — R200 Anesthesia of skin: Secondary | ICD-10-CM | POA: Insufficient documentation

## 2019-11-20 LAB — CBC
HCT: 47.6 % (ref 39.0–52.0)
Hemoglobin: 17.2 g/dL — ABNORMAL HIGH (ref 13.0–17.0)
MCH: 32.5 pg (ref 26.0–34.0)
MCHC: 36.1 g/dL — ABNORMAL HIGH (ref 30.0–36.0)
MCV: 90 fL (ref 80.0–100.0)
Platelets: 157 10*3/uL (ref 150–400)
RBC: 5.29 MIL/uL (ref 4.22–5.81)
RDW: 12.3 % (ref 11.5–15.5)
WBC: 8.5 10*3/uL (ref 4.0–10.5)
nRBC: 0 % (ref 0.0–0.2)

## 2019-11-20 LAB — COMPREHENSIVE METABOLIC PANEL
ALT: 14 U/L (ref 0–44)
AST: 12 U/L — ABNORMAL LOW (ref 15–41)
Albumin: 4.4 g/dL (ref 3.5–5.0)
Alkaline Phosphatase: 83 U/L (ref 38–126)
Anion gap: 10 (ref 5–15)
BUN: 12 mg/dL (ref 6–20)
CO2: 27 mmol/L (ref 22–32)
Calcium: 9.6 mg/dL (ref 8.9–10.3)
Chloride: 98 mmol/L (ref 98–111)
Creatinine, Ser: 0.85 mg/dL (ref 0.61–1.24)
GFR calc Af Amer: >60 mL/min (ref >60)
GFR calc non Af Amer: >60 mL/min (ref >60)
Glucose, Bld: 301 mg/dL — ABNORMAL HIGH (ref 70–99)
Potassium: 4.4 mmol/L (ref 3.5–5.1)
Sodium: 135 mmol/L (ref 135–145)
Total Bilirubin: 0.7 mg/dL (ref 0.3–1.2)
Total Protein: 7.1 g/dL (ref 6.5–8.1)

## 2019-11-20 LAB — URINALYSIS, COMPLETE (UACMP) WITH MICROSCOPIC
Bacteria, UA: NONE SEEN
Bilirubin Urine: NEGATIVE
Glucose, UA: >=500 mg/dL — AB
Hgb urine dipstick: NEGATIVE
Ketones, ur: NEGATIVE mg/dL
Leukocytes,Ua: NEGATIVE
Nitrite: NEGATIVE
Protein, ur: NEGATIVE mg/dL
Specific Gravity, Urine: 1.001 — ABNORMAL LOW (ref 1.005–1.030)
Squamous Epithelial / HPF: NONE SEEN (ref 0–5)
pH: 5 (ref 5.0–8.0)

## 2019-11-20 LAB — GLUCOSE, CAPILLARY: Glucose-Capillary: 282 mg/dL — ABNORMAL HIGH (ref 70–99)

## 2019-11-20 NOTE — ED Triage Notes (Signed)
Pt in with co bilat lower leg feet and leg numbness with burning. States has hx of neuropathy but wants to get it checked.

## 2019-11-21 NOTE — ED Notes (Signed)
Pt called in the lobby no answer 

## 2019-11-26 ENCOUNTER — Ambulatory Visit: Payer: Self-pay | Admitting: Licensed Clinical Social Worker

## 2019-11-26 ENCOUNTER — Other Ambulatory Visit: Payer: Self-pay

## 2019-11-26 ENCOUNTER — Encounter: Payer: Self-pay | Admitting: Licensed Clinical Social Worker

## 2019-11-26 DIAGNOSIS — F341 Dysthymic disorder: Secondary | ICD-10-CM

## 2019-11-26 DIAGNOSIS — F411 Generalized anxiety disorder: Secondary | ICD-10-CM

## 2019-11-26 NOTE — BH Specialist Note (Signed)
  Integrated Behavioral Health Follow Up Visit Via Phone  MRN: [id number] Name: [name]   Type of Service: Integrated Behavioral Health- Individual/Family Interpretor:No. Interpretor Name and Language: not applicable.   SUBJECTIVE: [name] is a 59 y.o. male accompanied by himself. Patient was referred by Hurman Horn NP for mental health. Patient reports the following symptoms/concerns: He notes that he is trying to get tested for COVID 19. He explains that he fears getting the COVID 19 vaccine due to his complex health issues. He explains that he gets [place] with [employer] because of the way that bus system works and how long it takes to get places. He notes that some nights that he is not sleeping well. He notes that he turns to his reading and does some meditating when he gets depressed. He notes that he likes to sit outside, watch the birds, and listen to them. He explains that he is trying to get to the point where he can be a little bit more active. He notes that sometimes he gets towards a bottom and will talk to friends. He denies suicidal and homicidal thoughts.  Duration of problem: ; Severity of problem: mild  OBJECTIVE: Mood: Euthymic and Affect: Appropriate Risk of harm to self or others: No plan to harm self or others  LIFE CONTEXT: Family and Social: see above. School/Work: see above. Self-Care: see above.  Life Changes: see above.  GOALS ADDRESSED: Patient will: 1.  Reduce symptoms of: agitation  2.  Increase knowledge and/or ability of: healthy habits and self-management skills  3.  Demonstrate ability to: Increase healthy adjustment to current life circumstances  INTERVENTIONS: Interventions utilized:  Solution-Focused Strategies was utilized by the clinician during today's follow up session. Clinician processed with the patient regarding how she has been doing since the last follow up session. Clinician measured the patient's anxiety and  depression on a numerical scale. Clinician made two referrals in [place] [facility] for ride coordination for his next follow up appointments in clinic and got the patient's verbal consent to be apart of the program.  Standardized Assessments completed: GAD-7 and PHQ 9  ASSESSMENT: Patient currently experiencing see above.   Patient may benefit from see above.   PLAN: 1. Follow up with behavioral health clinician on :  2. Behavioral recommendations: see above. 3. Referral(s): Integrated [employer] (In Clinic) 4. "From scale of 1-10, how likely are you to follow plan?":   Althia Forts, LCSW

## 2019-12-10 ENCOUNTER — Telehealth: Payer: Self-pay | Admitting: Gerontology

## 2019-12-10 NOTE — Telephone Encounter (Signed)
Successfully r/s appt.  

## 2019-12-16 ENCOUNTER — Ambulatory Visit: Payer: Self-pay | Admitting: Family Medicine

## 2019-12-17 ENCOUNTER — Ambulatory Visit: Payer: Self-pay | Admitting: Licensed Clinical Social Worker

## 2019-12-17 ENCOUNTER — Other Ambulatory Visit: Payer: Self-pay

## 2019-12-17 DIAGNOSIS — F411 Generalized anxiety disorder: Secondary | ICD-10-CM

## 2019-12-17 DIAGNOSIS — F341 Dysthymic disorder: Secondary | ICD-10-CM

## 2019-12-17 NOTE — BH Specialist Note (Signed)
Integrated Behavioral Health Follow Up Visit Via Phone  MRN: [id number] Name: [name]  Type of Service: Integrated Behavioral Health- Individual/Family Interpretor:No. Interpretor Name and Language: not applicable.   SUBJECTIVE: [name] is a 59 y.o. male accompanied by himself. Patient was referred by Hurman Horn NP for mental health. Patient reports the following symptoms/concerns: He notes that he is working on the environment changes. He notes that not much has been happening in the last few weeks and is taking things day by day. He reports that he has been doing a little bit of bible studying when he feels off. He discussed his chronic pain and ability to do things. He notes that he tries to keep himself calm and let things go. He notes that he keeps to himself and people come and go. He explains that he has been having pain and cramping in his legs, toes, and feet. He notes that he is trying to walk around some, sit outside in the shade, and meet different people. He notes that he tries to find things to do due to the person who was the guy giving him work no longer has anything for him. He explains that he is not trying to get a job right now and is future planning to move. He explains that he needs a change. He denies suicidal and homicidal thoughts.  Duration of problem:; Severity of problem: mild  OBJECTIVE: Mood: Euthymic and Affect: Appropriate Risk of harm to self or others: No plan to harm self or others  LIFE CONTEXT: Family and Social: see above. School/Work: see above. Self-Care: see above. Life Changes: see above.   GOALS ADDRESSED: Patient will: 1.  Reduce symptoms of: stress  2.  Increase knowledge and/or ability of: healthy habits and self-management skills  3.  Demonstrate ability to: Increase motivation to adhere to plan of care  INTERVENTIONS: Interventions utilized:  Brief CBT was utilized by the clinician during today's follow up session.  Clinician processed with the patient regarding how she has been doing since the last follow up session. Clinician measured the patient's anxiety and depression on a numerical scale. Clinician utilized reflective listening encouraging the patient to ventilate his feelings towards his current situation. Clinician informed the patient that [employer] [employer] is offering free transportation for the time being and bus stops are located in his area on [address]. Clinician asked the patient if he had scheduled an appointment with Lanora Manis to discuss his chronic pain.  Standardized Assessments completed: GAD-7 and PHQ 9  ASSESSMENT: Patient currently experiencing see above  Patient may benefit from see above.  PLAN: 1. Follow up with behavioral health clinician on :  2. Behavioral recommendations:  3. Referral(s): Integrated [employer] (In Clinic) 4. "From scale of 1-10, how likely are you to follow plan?":   Althia Forts, LCSW

## 2019-12-18 ENCOUNTER — Other Ambulatory Visit: Payer: Self-pay

## 2019-12-19 ENCOUNTER — Other Ambulatory Visit: Payer: Self-pay

## 2019-12-19 DIAGNOSIS — Z7984 Long term (current) use of oral hypoglycemic drugs: Secondary | ICD-10-CM | POA: Insufficient documentation

## 2019-12-19 DIAGNOSIS — E119 Type 2 diabetes mellitus without complications: Secondary | ICD-10-CM | POA: Insufficient documentation

## 2019-12-19 DIAGNOSIS — I739 Peripheral vascular disease, unspecified: Secondary | ICD-10-CM | POA: Insufficient documentation

## 2019-12-19 DIAGNOSIS — I10 Essential (primary) hypertension: Secondary | ICD-10-CM | POA: Insufficient documentation

## 2019-12-19 DIAGNOSIS — F17210 Nicotine dependence, cigarettes, uncomplicated: Secondary | ICD-10-CM | POA: Insufficient documentation

## 2019-12-19 DIAGNOSIS — Z79899 Other long term (current) drug therapy: Secondary | ICD-10-CM | POA: Insufficient documentation

## 2019-12-19 LAB — COMPREHENSIVE METABOLIC PANEL
ALT: 15 U/L (ref 0–44)
AST: 14 U/L — ABNORMAL LOW (ref 15–41)
Albumin: 4.3 g/dL (ref 3.5–5.0)
Alkaline Phosphatase: 67 U/L (ref 38–126)
Anion gap: 9 (ref 5–15)
BUN: 10 mg/dL (ref 6–20)
CO2: 29 mmol/L (ref 22–32)
Calcium: 10 mg/dL (ref 8.9–10.3)
Chloride: 101 mmol/L (ref 98–111)
Creatinine, Ser: 0.89 mg/dL (ref 0.61–1.24)
GFR calc Af Amer: >60 mL/min (ref >60)
GFR calc non Af Amer: >60 mL/min (ref >60)
Glucose, Bld: 184 mg/dL — ABNORMAL HIGH (ref 70–99)
Potassium: 3.6 mmol/L (ref 3.5–5.1)
Sodium: 139 mmol/L (ref 135–145)
Total Bilirubin: 0.9 mg/dL (ref 0.3–1.2)
Total Protein: 7 g/dL (ref 6.5–8.1)

## 2019-12-19 LAB — CBC
HCT: 47.8 % (ref 39.0–52.0)
Hemoglobin: 16.6 g/dL (ref 13.0–17.0)
MCH: 32.4 pg (ref 26.0–34.0)
MCHC: 34.7 g/dL (ref 30.0–36.0)
MCV: 93.4 fL (ref 80.0–100.0)
Platelets: 148 10*3/uL — ABNORMAL LOW (ref 150–400)
RBC: 5.12 MIL/uL (ref 4.22–5.81)
RDW: 12.2 % (ref 11.5–15.5)
WBC: 8.1 10*3/uL (ref 4.0–10.5)
nRBC: 0 % (ref 0.0–0.2)

## 2019-12-19 LAB — URINALYSIS, COMPLETE (UACMP) WITH MICROSCOPIC
Bacteria, UA: NONE SEEN
Bilirubin Urine: NEGATIVE
Glucose, UA: NEGATIVE mg/dL
Hgb urine dipstick: NEGATIVE
Ketones, ur: NEGATIVE mg/dL
Leukocytes,Ua: NEGATIVE
Nitrite: NEGATIVE
Protein, ur: NEGATIVE mg/dL
Specific Gravity, Urine: 1.006 (ref 1.005–1.030)
Squamous Epithelial / HPF: NONE SEEN (ref 0–5)
pH: 5 (ref 5.0–8.0)

## 2019-12-19 LAB — LIPASE, BLOOD: Lipase: 31 U/L (ref 11–51)

## 2019-12-19 LAB — GLUCOSE, CAPILLARY: Glucose-Capillary: 177 mg/dL — ABNORMAL HIGH (ref 70–99)

## 2019-12-19 NOTE — ED Triage Notes (Signed)
Pt in with co hyperglycemia for months, states was 245 yesterday. Having periods of legs "stinging" at times. Has had nausea, no vomiting.

## 2019-12-20 ENCOUNTER — Emergency Department
Admission: EM | Admit: 2019-12-20 | Discharge: 2019-12-20 | Discharge status: Against Medical Advice | Payer: Self-pay | Attending: Emergency Medicine | Admitting: Emergency Medicine

## 2019-12-20 ENCOUNTER — Inpatient Hospital Stay: Admit: 2019-12-20 | Payer: Self-pay

## 2019-12-20 DIAGNOSIS — I739 Peripheral vascular disease, unspecified: Secondary | ICD-10-CM

## 2019-12-20 NOTE — Consult Note (Signed)
[facility] Consult Note  MRN : [id number]  [name] is a 59 y.o. (Feb 27, 1961) male who presents with chief complaint of  Chief Complaint  Patient presents with  . Hyperglycemia   History of Present Illness: The patient is a 59 year old male with multiple medical issues including known diagnosis of peripheral artery disease who presents to the [facility] emergency department with bilateral lower extremity leg pain.  Patient underwent ABI in 2018.  ABI with severe peripheral artery disease.  Patient has no showed for two appointments in our office.  Patient reports bilateral lower extremity claudication left worse than right.  Denies any ulcer formation to the bilateral feet.  Denies any rest pain.  Noncompliant with medication.  Denies fever, nausea vomiting.  Denies shortness of breath or chest pain.  Vascular surgery was consulted by Dr. Charna Archer for peripheral artery disease.  No current facility-administered medications for this encounter.   Current Outpatient Medications  Medication Sig Dispense Refill  . acetaminophen (TYLENOL) 325 MG tablet Take 650 mg by mouth every 6 (six) hours as needed.    Marland Kitchen aspirin EC 81 MG tablet Take 1 tablet (81 mg total) by mouth daily. (Patient not taking: Reported on 10/02/2019) 30 tablet 3  . atorvastatin (LIPITOR) 40 MG tablet Take 1 tablet (40 mg total) by mouth daily. 30 tablet 3  . blood glucose meter kit and supplies KIT Dispense based on patient and insurance preference. Use up to four times daily as directed. (FOR ICD-9 250.00, 250.01). 1 each 0  . buPROPion (WELLBUTRIN) 100 MG tablet Take 1 tablet (100 mg total) by mouth 2 (two) times daily. 60 tablet 1  . busPIRone (BUSPAR) 10 MG tablet Take 1 tablet (10 mg total) by mouth 3 (three) times daily. 90 tablet 1  . glimepiride (AMARYL) 4 MG tablet Take 1.5 tablets (6 mg total) by mouth daily with breakfast. 60 tablet 2  . glucose blood  (TRUE METRIX BLOOD GLUCOSE TEST) test strip Use as instructed (Patient not taking: Reported on 09/04/2018) 100 each 12  . metFORMIN (GLUCOPHAGE) 1000 MG tablet TAKE ONE TABLET BY MOUTH 2 TIMES A DAY WITH A MEAL 60 tablet 0  . metoprolol tartrate (LOPRESSOR) 50 MG tablet TAKE ONE TABLET BY MOUTH 2 TIMES A DAY 60 tablet 0   Past Medical History:  Diagnosis Date  . Arthritis   . Blood clot in vein   . Diabetes mellitus   . DVT (deep venous thrombosis) ([place])   . Hypertension   . Neuropathy    Past Surgical History:  Procedure Laterality Date  . APPENDECTOMY    . VEIN LIGATION AND STRIPPING     of left leg   Social History Social History   Tobacco Use  . Smoking status: Current Every Day Smoker    Packs/day: 2.00    Years: 6.00    Pack years: 12.00    Types: Cigarettes  . Smokeless tobacco: Never Used  Vaping Use  . Vaping Use: Never used  Substance Use Topics  . Alcohol use: No  . Drug use: No   Family History Family History  Problem Relation Age of Onset  . Heart disease Mother   . Diabetes Father   . Heart disease Father   Denies family history of peripheral artery disease, venous disease or renal disease.  Allergies  Allergen Reactions  . Erythromycin Base Anxiety  . Xanax [Alprazolam] Other (See Comments)    Tachycardia  . Benzodiazepines  PT STATES HE CANNOT TAKE THESE BECAUSE THEY ALMOST KILLED HIM  . Methocarbamol Anxiety and Other (See Comments)    Stomach cramping, weakness  . Sulfa Antibiotics Nausea And Vomiting and Other (See Comments)    Cramping in stomach and hyperventilate.    REVIEW OF SYSTEMS (Negative unless checked)  Constitutional: _0 Weight loss  _1 Fever  _2 Chills Cardiac: _3 Chest pain   _4 Chest pressure   _5 Palpitations   _6 Shortness of breath when laying flat   _7 Shortness of breath at rest   _8 Shortness of breath with exertion. Vascular:  _9 Pain in legs with walking   _10 Pain in legs at rest   _11 Pain in legs when laying flat    _12 Claudication   _13 Pain in feet when walking  _14 Pain in feet at rest  _15 Pain in feet when laying flat   _16 History of DVT   _17 Phlebitis   _18 Swelling in legs   _19 Varicose veins   _20 Non-healing ulcers Pulmonary:   _21 Uses home oxygen   _22 Productive cough   _23 Hemoptysis   _24 Wheeze  _25 COPD   _26 Asthma Neurologic:  _27 Dizziness  _28 Blackouts   _29 Seizures   _30 History of stroke   _31 History of TIA  _32 Aphasia   _33 Temporary blindness   _34 Dysphagia   _35 Weakness or numbness in arms   _36 Weakness or numbness in legs Musculoskeletal:  _37 Arthritis   _38 Joint swelling   _39 Joint pain   _40 Low back pain Hematologic:  _41 Easy bruising  _42 Easy bleeding   _43 Hypercoagulable state   _44 Anemic  _45 Hepatitis Gastrointestinal:  _46 Blood in stool   _47 Vomiting blood  _48 Gastroesophageal reflux/heartburn   _49 Difficulty swallowing. Genitourinary:  _50 Chronic kidney disease   _51 Difficult urination  _52 Frequent urination  _53 Burning with urination   _54 Blood in urine Skin:  _55 Rashes   _56 Ulcers   _57 Wounds Psychological:  _58 History of anxiety   _59  History of major depression.  Physical Examination  Vitals:   12/20/19 0813 12/20/19 0813 12/20/19 0813 12/20/19 0814  BP: 135/83 (!) 150/80 (!) 103/45 (!) 91/40  Pulse:      Resp:      Temp:      TempSrc:      SpO2:      Weight:      Height:       Body mass index is 30.68 kg/m. Gen:  WD/WN, NAD Head: [place]/AT, No temporalis wasting. Prominent temp pulse not noted. Ear/Nose/Throat: Hearing grossly intact, nares w/o erythema or drainage, oropharynx w/o Erythema/Exudate Eyes: Sclera non-icteric, conjunctiva clear Neck: Trachea midline.  No JVD.  Pulmonary:  Good air movement, respirations not labored, equal bilaterally.  Cardiac: RRR, normal S1, S2. Vascular:  Vessel Right Left  Radial Palpable Palpable  Ulnar Palpable Palpable  Brachial Palpable Palpable  Carotid Palpable, without bruit Palpable, without bruit  Aorta Not palpable N/A  Femoral Palpable Palpable  Popliteal Palpable  Palpable  PT Non-Palpable Non-Palpable  DP Non-Palpable Non-Palpable   Gastrointestinal: soft, non-tender/non-distended. No guarding/reflex.  Musculoskeletal: M/S 5/5 throughout.  Extremities without ischemic changes.  No deformity or atrophy. No edema. Neurologic: Sensation grossly intact in extremities.  Symmetrical.  Speech is fluent. Motor exam as listed above. Psychiatric: Judgment intact, Mood & affect appropriate for pt's clinical situation. Dermatologic: No rashes or ulcers noted.  No cellulitis or open wounds. Lymph : No Cervical, Axillary, or Inguinal lymphadenopathy.  CBC Lab Results  Component Value Date   WBC 8.1 12/19/2019   HGB 16.6 12/19/2019   HCT 47.8 12/19/2019   MCV 93.4 12/19/2019   PLT 148 (L) 12/19/2019   BMET    Component  Value Date/Time   NA 139 12/19/2019 2135   NA 138 09/18/2019 1221   NA 135 (L) 08/14/2013 1710   K 3.6 12/19/2019 2135   K 3.8 08/14/2013 1710   CL 101 12/19/2019 2135   CL 103 08/14/2013 1710   CO2 29 12/19/2019 2135   CO2 32 08/14/2013 1710   GLUCOSE 184 (H) 12/19/2019 2135   GLUCOSE 116 (H) 08/14/2013 1710   BUN 10 12/19/2019 2135   BUN 12 09/18/2019 1221   BUN 11 08/14/2013 1710   CREATININE 0.89 12/19/2019 2135   CREATININE 0.82 01/21/2016 1523   CALCIUM 10.0 12/19/2019 2135   CALCIUM 9.3 08/14/2013 1710   GFRNONAA >60 12/19/2019 2135   GFRNONAA >60 08/14/2013 1710   GFRNONAA >89 05/24/2013 1211   GFRAA >60 12/19/2019 2135   GFRAA >60 08/14/2013 1710   GFRAA >89 05/24/2013 1211   Estimated Creatinine Clearance: 106.2 mL/min (by C-G formula based on SCr of 0.89 mg/dL).  COAG No results found for: INR, PROTIME  Radiology No results found.  Assessment/Plan The patient is a 59 year old male with multiple medical issues who presented to the [facility] emergency department with bilateral lower leg claudication-like symptoms.  1. Peripheral Artery Disease: Patient with known diagnosis of  peripheral artery disease in 2018.  Patient underwent ABI which was notable for severe disease bilaterally.  Patient with multiple risk factors for atherosclerotic disease.  Unfortunately, patient has been noncompliant with medications as well as outpatient follow-up.  Claudication-like symptoms to the bilateral legs, left worse than right.  Recommend undergoing left lower extremity angiogram with possible intervention followed by right lower extremity angiogram possible intervention.  Had a long discussion with the patient in regard to the diagnosis of severe peripheral artery disease.  He understands if left untreated he is at high risk for tissue loss and possible amputation.  Procedure, risks and benefits explained to the patient.  All questions were answered.  We will schedule the patient for left lower extremity angiogram followed by a right with Dr. Lucky Cowboy.  2.  Type 2 diabetes: Unfortunately patient has not been compliant with medications. Encouraged good control as its slows the progression of atherosclerotic disease  3.  Dyslipidemia: On aspirin and statin for medical management however patient has been noncompliant with medications Encouraged good control as its slows the progression of atherosclerotic disease  4.  Tobacco abuse disorder: We had a discussion for approximately three minutes regarding the absolute need for smoking cessation due to the deleterious nature of tobacco on the vascular system. We discussed the tobacco use would diminish patency of any intervention, and likely significantly worsen progressio of disease. We discussed multiple agents for quitting including replacement therapy or medications to reduce cravings such as Chantix. The patient voices their understanding of the importance of smoking cessation.  Discussed with Dr. Mayme Genta, PA-C  12/20/2019 10:39 AM  This note was created with Dragon medical transcription system.  Any error is purely  unintentional

## 2019-12-20 NOTE — ED Notes (Signed)
ED Provider at bedside. 

## 2019-12-20 NOTE — ED Notes (Signed)
Pt noted not to be present in the room.

## 2019-12-20 NOTE — ED Provider Notes (Signed)
[facility] Emergency Department Provider Note   ____________________________________________   First MD Initiated Contact with Patient 12/20/19 [phone]     (approximate)  I have reviewed the triage vital signs and the nursing notes.   HISTORY  Chief Complaint Leg Pain   HPI [name] is a 59 y.o. male with past with history of hypertension, diabetes, hyperlipidemia, peripheral vascular disease, and DVT who presents to the ED complaining of leg pain.  Patient reports he has been dealing with intermittent pain in both of his legs for at least the past month.  He describes it as a stinging that is much worse when he goes to walk.  It primarily affects his left leg but is also present in his right leg.  He has not noticed any associated swelling or skin issues to his legs.  He does report a long history of vascular issues in his leg, had grafting performed to his left lower extremity at [facility] but has not followed closely with vascular surgery since then.  Current pain improves when he drops his legs off the bed, but the pain makes it very difficult for him to walk.  He also has a history of diabetes and reports his blood sugars have been running high, but denies any fevers, cough, chest pain, shortness of breath, vomiting, or diarrhea.        Past Medical History:  Diagnosis Date  . Arthritis   . Blood clot in vein   . Diabetes mellitus   . DVT (deep venous thrombosis) ([place])   . Hypertension   . Neuropathy     Patient Active Problem List   Diagnosis Date Noted  . Needs assistance with community resources 09/11/2019  . Peripheral vascular disease ([place]) 10/23/2018  . Neuropathic pain of ankle, left 01/25/2017  . Adjustment disorder with mixed anxiety and depressed mood 08/27/2016  . Hypertriglyceridemia 06/30/2016  . Thrombocytopenia, unspecified ([place]) 12/06/2012  . Diabetic neuropathy ([place]) 12/05/2012  . Tobacco use disorder 12/05/2012  . Dyslipidemia  12/05/2012  . Type 2 diabetes mellitus ([place]) 05/11/2011  . Essential hypertension 05/11/2011    Past Surgical History:  Procedure Laterality Date  . APPENDECTOMY    . VEIN LIGATION AND STRIPPING     of left leg    Prior to Admission medications   Medication Sig Start Date End Date Taking? Authorizing Provider  acetaminophen (TYLENOL) 325 MG tablet Take 650 mg by mouth every 6 (six) hours as needed.    [provider]  aspirin EC 81 MG tablet Take 1 tablet (81 mg total) by mouth daily. Patient not taking: Reported on 10/02/2019 10/23/18   Charlott Rakes, MD  atorvastatin (LIPITOR) 40 MG tablet Take 1 tablet (40 mg total) by mouth daily. 10/02/19   Iloabachie, Chioma E, NP  blood glucose meter kit and supplies KIT Dispense based on patient and insurance preference. Use up to four times daily as directed. (FOR ICD-9 250.00, 250.01). 08/15/19   Iloabachie, Chioma E, NP  buPROPion (WELLBUTRIN) 100 MG tablet Take 1 tablet (100 mg total) by mouth 2 (two) times daily. 09/10/19   Iloabachie, Chioma E, NP  busPIRone (BUSPAR) 10 MG tablet Take 1 tablet (10 mg total) by mouth 3 (three) times daily. 09/10/19   Iloabachie, Chioma E, NP  glimepiride (AMARYL) 4 MG tablet Take 1.5 tablets (6 mg total) by mouth daily with breakfast. 10/02/19 11/01/19  Iloabachie, Chioma E, NP  glucose blood (TRUE METRIX BLOOD GLUCOSE TEST) test strip Use  as instructed Patient not taking: Reported on 09/04/2018 01/31/17   Tresa Garter, MD  metFORMIN (GLUCOPHAGE) 1000 MG tablet TAKE ONE TABLET BY MOUTH 2 TIMES A DAY WITH A MEAL 10/09/19   Iloabachie, Chioma E, NP  metoprolol tartrate (LOPRESSOR) 50 MG tablet TAKE ONE TABLET BY MOUTH 2 TIMES A DAY 10/09/19   Iloabachie, Chioma E, NP    Allergies Erythromycin base, Xanax [alprazolam], Benzodiazepines, Methocarbamol, and Sulfa antibiotics  Family History  Problem Relation Age of Onset  . Heart disease Mother   . Diabetes Father   . Heart disease Father     Social  History Social History   Tobacco Use  . Smoking status: Current Every Day Smoker    Packs/day: 2.00    Years: 6.00    Pack years: 12.00    Types: Cigarettes  . Smokeless tobacco: Never Used  Vaping Use  . Vaping Use: Never used  Substance Use Topics  . Alcohol use: No  . Drug use: No    Review of Systems  Constitutional: No fever/chills Eyes: No visual changes. ENT: No sore throat. Cardiovascular: Denies chest pain. Respiratory: Denies shortness of breath. Gastrointestinal: No abdominal pain.  No nausea, no vomiting.  No diarrhea.  No constipation. Genitourinary: Negative for dysuria. Musculoskeletal: Negative for back pain.  Positive for leg pain.   Skin: Negative for rash. Neurological: Negative for headaches, focal weakness or numbness.  ____________________________________________   PHYSICAL EXAM:  VITAL SIGNS: ED Triage Vitals  Enc Vitals Group     BP 12/19/19 2133 (!) 155/67     Pulse Rate 12/19/19 2132 85     Resp 12/19/19 2132 20     Temp 12/19/19 2132 98.6 F (37 C)     Temp Source 12/19/19 2132 Oral     SpO2 12/19/19 2132 98 %     Weight 12/19/19 2133 220 lb (99.8 kg)     Height 12/19/19 2133 '5\' 11"'  (1.803 m)     Head Circumference --      Peak Flow --      Pain Score 12/19/19 2133 8     Pain Loc --      Pain Edu? --      Excl. in [place]? --     Constitutional: Alert and oriented. Eyes: Conjunctivae are normal. Head: Atraumatic. Nose: No congestion/rhinnorhea. Mouth/Throat: Mucous membranes are moist. Neck: Normal ROM Cardiovascular: Normal rate, regular rhythm. Grossly normal heart sounds.  1+ DP pulses bilaterally. Respiratory: Normal respiratory effort.  No retractions. Lungs CTAB. Gastrointestinal: Soft and nontender. No distention. Genitourinary: deferred Musculoskeletal: No lower extremity tenderness nor edema.  Lower extremities warm and well perfused. Neurologic:  Normal speech and language. No gross focal neurologic deficits are  appreciated. Skin:  Skin is warm, dry and intact. No rash noted. Psychiatric: Mood and affect are normal. Speech and behavior are normal.  ____________________________________________   LABS (all labs ordered are listed, but only abnormal results are displayed)  Labs Reviewed  CBC - Abnormal; Notable for the following components:      Result Value   Platelets 148 (*)    All other components within normal limits  COMPREHENSIVE METABOLIC PANEL - Abnormal; Notable for the following components:   Glucose, Bld 184 (*)    AST 14 (*)    All other components within normal limits  URINALYSIS, COMPLETE (UACMP) WITH MICROSCOPIC - Abnormal; Notable for the following components:   Color, Urine YELLOW (*)    APPearance CLEAR (*)    All  other components within normal limits  GLUCOSE, CAPILLARY - Abnormal; Notable for the following components:   Glucose-Capillary 177 (*)    All other components within normal limits  SARS CORONAVIRUS 2 (TAT 6-24 HRS)  LIPASE, BLOOD  CBG MONITORING, ED    PROCEDURES  Procedure(s) performed (including Critical Care):  Procedures   ____________________________________________   INITIAL IMPRESSION / ASSESSMENT AND PLAN / ED COURSE       59 year old male with history of diabetes, hypertension, hyperlipidemia, peripheral vascular disease, and DVT presents to the ED complaining of intermittent stinging pain to his bilateral lower extremities.  He has a history of poorly controlled diabetes and neuropathy, however his blood glucose is essentially normal at this time with no evidence of electrolyte abnormality.  Lab work overall is reassuring.  He also has a long history of peripheral vascular disease and there is likely a component of claudication contributing to his lower extremity pain.  He does have palpable pulses in both lower extremities with all toes appearing well-perfused and cap refill less than 2 seconds.  We will check ABIs and compared to previous, he  would likely benefit from outpatient follow-up with vascular surgery.  Case discussed with Dr. Lucky Cowboy of vascular surgery and patient was evaluated at the bedside by vascular surgery team.  They have arranged for outpatient angiogram and he is now cleared for discharge home.  Patient unfortunately eloped prior to receiving discharge paperwork.      ____________________________________________   FINAL CLINICAL IMPRESSION(S) / ED DIAGNOSES  Final diagnoses:  Peripheral vascular disease ([place])  Claudication [facility])     ED Discharge Orders    None       Note:  This document was prepared using Dragon voice recognition software and may include unintentional dictation errors.   Blake Divine, MD 12/20/19 [phone]

## 2019-12-20 NOTE — ED Notes (Signed)
Consult MD at bedside.

## 2019-12-23 ENCOUNTER — Other Ambulatory Visit (INDEPENDENT_AMBULATORY_CARE_PROVIDER_SITE_OTHER): Payer: Self-pay | Admitting: Nurse Practitioner

## 2019-12-23 ENCOUNTER — Encounter: Admission: RE | Payer: Self-pay | Source: Home / Self Care

## 2019-12-23 ENCOUNTER — Ambulatory Visit: Admission: RE | Admit: 2019-12-23 | Payer: Self-pay | Source: Home / Self Care | Admitting: Vascular Surgery

## 2019-12-23 SURGERY — LOWER EXTREMITY ANGIOGRAPHY
Anesthesia: Moderate Sedation | Laterality: Left

## 2019-12-24 ENCOUNTER — Ambulatory Visit: Payer: Self-pay | Admitting: Gerontology

## 2019-12-24 ENCOUNTER — Telehealth: Payer: Self-pay | Admitting: Gerontology

## 2019-12-24 NOTE — Telephone Encounter (Addendum)
-----   Message from Benjamin Stain, [place] sent at 12/24/2019  1:12 PM EDT ----- Regarding: r/s appt Please call patient to r/s missed lab appt on 6/17; he will need to r/s 6/30 appt to after he has had labs done.  Called could not be completed and voicemail box was not available. kcr 06/22 1:59pm

## 2019-12-26 ENCOUNTER — Other Ambulatory Visit: Payer: Self-pay | Admitting: Gerontology

## 2019-12-26 DIAGNOSIS — E1149 Type 2 diabetes mellitus with other diabetic neurological complication: Secondary | ICD-10-CM

## 2019-12-26 DIAGNOSIS — E1110 Type 2 diabetes mellitus with ketoacidosis without coma: Secondary | ICD-10-CM

## 2019-12-26 DIAGNOSIS — I10 Essential (primary) hypertension: Secondary | ICD-10-CM

## 2019-12-30 ENCOUNTER — Other Ambulatory Visit: Payer: Self-pay

## 2019-12-30 DIAGNOSIS — E1149 Type 2 diabetes mellitus with other diabetic neurological complication: Secondary | ICD-10-CM

## 2019-12-30 MED ORDER — GLIMEPIRIDE 4 MG PO TABS: 6.0000 mg | ORAL_TABLET | Freq: Every day | ORAL | 0 refills | Status: DC

## 2019-12-31 ENCOUNTER — Telehealth: Payer: Self-pay | Admitting: Gerontology

## 2019-12-31 NOTE — Telephone Encounter (Signed)
Called to reschedule the lab and fu appts missed... did not answer and voicemail not set up yet

## 2019-12-31 NOTE — Telephone Encounter (Signed)
Call could not be completed. Went ahead and r/s appt due to urgency.

## 2020-01-01 ENCOUNTER — Telehealth: Payer: Self-pay | Admitting: Gerontology

## 2020-01-01 ENCOUNTER — Ambulatory Visit: Payer: Self-pay | Admitting: Gerontology

## 2020-01-01 NOTE — Telephone Encounter (Signed)
Contacted 3+ times therefore marked completed

## 2020-01-07 ENCOUNTER — Telehealth: Payer: Self-pay | Admitting: Licensed Clinical Social Worker

## 2020-01-07 ENCOUNTER — Ambulatory Visit: Payer: Self-pay | Admitting: Licensed Clinical Social Worker

## 2020-01-07 NOTE — Telephone Encounter (Signed)
Clinician attempted to contact the patient twice during their scheduled phone visit and his voicemail box has not been set up.

## 2020-01-14 ENCOUNTER — Ambulatory Visit: Payer: Self-pay | Admitting: Gerontology

## 2020-01-15 ENCOUNTER — Inpatient Hospital Stay (HOSPITAL_COMMUNITY)
Admission: EM | Admit: 2020-01-15 | Discharge: 2020-01-17 | DRG: 301 | Disposition: A | Discharge status: Home / Self Care | Payer: Self-pay | Attending: Vascular Surgery | Admitting: Vascular Surgery

## 2020-01-15 ENCOUNTER — Encounter (HOSPITAL_COMMUNITY): Payer: Self-pay

## 2020-01-15 DIAGNOSIS — I998 Other disorder of circulatory system: Secondary | ICD-10-CM | POA: Diagnosis present

## 2020-01-15 DIAGNOSIS — R202 Paresthesia of skin: Secondary | ICD-10-CM | POA: Diagnosis present

## 2020-01-15 DIAGNOSIS — F17210 Nicotine dependence, cigarettes, uncomplicated: Secondary | ICD-10-CM | POA: Diagnosis present

## 2020-01-15 DIAGNOSIS — Z8249 Family history of ischemic heart disease and other diseases of the circulatory system: Secondary | ICD-10-CM

## 2020-01-15 DIAGNOSIS — Z86718 Personal history of other venous thrombosis and embolism: Secondary | ICD-10-CM

## 2020-01-15 DIAGNOSIS — I70223 Atherosclerosis of native arteries of extremities with rest pain, bilateral legs: Principal | ICD-10-CM | POA: Diagnosis present

## 2020-01-15 DIAGNOSIS — I739 Peripheral vascular disease, unspecified: Secondary | ICD-10-CM

## 2020-01-15 DIAGNOSIS — Z7982 Long term (current) use of aspirin: Secondary | ICD-10-CM

## 2020-01-15 DIAGNOSIS — Z9049 Acquired absence of other specified parts of digestive tract: Secondary | ICD-10-CM

## 2020-01-15 DIAGNOSIS — Z20822 Contact with and (suspected) exposure to covid-19: Secondary | ICD-10-CM | POA: Diagnosis present

## 2020-01-15 DIAGNOSIS — Z882 Allergy status to sulfonamides status: Secondary | ICD-10-CM

## 2020-01-15 DIAGNOSIS — I10 Essential (primary) hypertension: Secondary | ICD-10-CM | POA: Diagnosis present

## 2020-01-15 DIAGNOSIS — E1151 Type 2 diabetes mellitus with diabetic peripheral angiopathy without gangrene: Secondary | ICD-10-CM | POA: Diagnosis present

## 2020-01-15 DIAGNOSIS — M79605 Pain in left leg: Secondary | ICD-10-CM | POA: Diagnosis present

## 2020-01-15 DIAGNOSIS — R200 Anesthesia of skin: Secondary | ICD-10-CM | POA: Diagnosis present

## 2020-01-15 DIAGNOSIS — Z881 Allergy status to other antibiotic agents status: Secondary | ICD-10-CM

## 2020-01-15 DIAGNOSIS — Z9119 Patient's noncompliance with other medical treatment and regimen: Secondary | ICD-10-CM

## 2020-01-15 DIAGNOSIS — Z888 Allergy status to other drugs, medicaments and biological substances status: Secondary | ICD-10-CM

## 2020-01-15 DIAGNOSIS — Z79899 Other long term (current) drug therapy: Secondary | ICD-10-CM

## 2020-01-15 DIAGNOSIS — Z7984 Long term (current) use of oral hypoglycemic drugs: Secondary | ICD-10-CM

## 2020-01-15 DIAGNOSIS — M79604 Pain in right leg: Secondary | ICD-10-CM | POA: Diagnosis present

## 2020-01-15 DIAGNOSIS — Z833 Family history of diabetes mellitus: Secondary | ICD-10-CM

## 2020-01-15 DIAGNOSIS — Z5329 Procedure and treatment not carried out because of patient's decision for other reasons: Secondary | ICD-10-CM | POA: Diagnosis present

## 2020-01-15 LAB — COMPREHENSIVE METABOLIC PANEL
ALT: 15 U/L (ref 0–44)
AST: 15 U/L (ref 15–41)
Albumin: 4.2 g/dL (ref 3.5–5.0)
Alkaline Phosphatase: 64 U/L (ref 38–126)
Anion gap: 11 (ref 5–15)
BUN: 10 mg/dL (ref 6–20)
CO2: 26 mmol/L (ref 22–32)
Calcium: 9.8 mg/dL (ref 8.9–10.3)
Chloride: 102 mmol/L (ref 98–111)
Creatinine, Ser: 0.72 mg/dL (ref 0.61–1.24)
GFR calc Af Amer: >60 mL/min (ref >60)
GFR calc non Af Amer: >60 mL/min (ref >60)
Glucose, Bld: 158 mg/dL — ABNORMAL HIGH (ref 70–99)
Potassium: 4.1 mmol/L (ref 3.5–5.1)
Sodium: 139 mmol/L (ref 135–145)
Total Bilirubin: 0.7 mg/dL (ref 0.3–1.2)
Total Protein: 6.6 g/dL (ref 6.5–8.1)

## 2020-01-15 LAB — CBC WITH DIFFERENTIAL/PLATELET
Abs Immature Granulocytes: 0.02 10*3/uL (ref 0.00–0.07)
Basophils Absolute: 0.1 10*3/uL (ref 0.0–0.1)
Basophils Relative: 1 %
Eosinophils Absolute: 0.5 10*3/uL (ref 0.0–0.5)
Eosinophils Relative: 6 %
HCT: 49.3 % (ref 39.0–52.0)
Hemoglobin: 16.8 g/dL (ref 13.0–17.0)
Immature Granulocytes: 0 %
Lymphocytes Relative: 28 %
Lymphs Abs: 2.1 10*3/uL (ref 0.7–4.0)
MCH: 32.4 pg (ref 26.0–34.0)
MCHC: 34.1 g/dL (ref 30.0–36.0)
MCV: 95 fL (ref 80.0–100.0)
Monocytes Absolute: 0.8 10*3/uL (ref 0.1–1.0)
Monocytes Relative: 10 %
Neutro Abs: 4.2 10*3/uL (ref 1.7–7.7)
Neutrophils Relative %: 55 %
Platelets: 147 10*3/uL — ABNORMAL LOW (ref 150–400)
RBC: 5.19 MIL/uL (ref 4.22–5.81)
RDW: 12.5 % (ref 11.5–15.5)
WBC: 7.6 10*3/uL (ref 4.0–10.5)
nRBC: 0 % (ref 0.0–0.2)

## 2020-01-15 NOTE — ED Provider Notes (Signed)
Patient placed in Quick Look pathway, seen and evaluated   Chief Complaint: Leg pain  HPI:   Several weeks of worsening leg pain.  Scheduled for lower extremity angiography June 21 at [facility].  Patient left without getting this procedure, states they had him waiting too long.  ROS: Leg pain (one)  Physical Exam:   Gen: No distress  Neuro: Awake and Alert  Skin: Warm    Focused Exam:   Feet and toes adequately warm to the touch. Sensation to light touch grossly intact. Strength 5/5 in the bilateral ankles and big toes.  Right foot: PT pulses 1+, DP pulses unable to be dopplered. Left foot: PT and DP pulses 1+.   Initiation of care has begun. The patient has been counseled on the process, plan, and necessity for staying for the completion/evaluation, and the remainder of the medical screening examination   Concepcion Living 01/15/20 2104    Melene Plan, DO 01/15/20 2233

## 2020-01-15 NOTE — ED Triage Notes (Signed)
Pt arrives POV for eval of blt leg pain x 3 weeks. Pt reports that he was seen outpt for imaging studies and advised that he needed surgery at that time. Pt no showed for surgery because "i'm a scaredy cat". Pt reports pain is worse today, causing him to present today.

## 2020-01-16 ENCOUNTER — Inpatient Hospital Stay (HOSPITAL_COMMUNITY): Payer: Self-pay

## 2020-01-16 ENCOUNTER — Other Ambulatory Visit: Payer: Self-pay

## 2020-01-16 DIAGNOSIS — I998 Other disorder of circulatory system: Secondary | ICD-10-CM | POA: Diagnosis present

## 2020-01-16 DIAGNOSIS — R52 Pain, unspecified: Secondary | ICD-10-CM

## 2020-01-16 DIAGNOSIS — I70223 Atherosclerosis of native arteries of extremities with rest pain, bilateral legs: Secondary | ICD-10-CM

## 2020-01-16 LAB — SARS CORONAVIRUS 2 BY RT PCR (HOSPITAL ORDER, PERFORMED IN ~~LOC~~ HOSPITAL LAB): SARS Coronavirus 2: NEGATIVE

## 2020-01-16 LAB — HIV ANTIBODY (ROUTINE TESTING W REFLEX): HIV Screen 4th Generation wRfx: NONREACTIVE

## 2020-01-16 LAB — HEPARIN LEVEL (UNFRACTIONATED): Heparin Unfractionated: 0.68 IU/mL (ref 0.30–0.70)

## 2020-01-16 MED ORDER — PHENOL 1.4 % MT LIQD
1.0000 | OROMUCOSAL | Status: DC | PRN
  Filled 2020-01-16: qty 177

## 2020-01-16 MED ORDER — PANTOPRAZOLE SODIUM 40 MG PO TBEC: 40.0000 mg | DELAYED_RELEASE_TABLET | Freq: Every day | ORAL | Status: DC

## 2020-01-16 MED ORDER — HEPARIN BOLUS VIA INFUSION
4700.0000 [IU] | Freq: Once | INTRAVENOUS | Status: AC
  Administered 2020-01-16: 4700 [IU] via INTRAVENOUS
  Filled 2020-01-16: qty 4700

## 2020-01-16 MED ORDER — ONDANSETRON HCL 4 MG/2ML IJ SOLN: 4.0000 mg | Freq: Four times a day (QID) | INTRAMUSCULAR | Status: DC | PRN

## 2020-01-16 MED ORDER — ACETAMINOPHEN 325 MG PO TABS: 325.0000 mg | ORAL_TABLET | ORAL | Status: DC | PRN

## 2020-01-16 MED ORDER — METOPROLOL TARTRATE 5 MG/5ML IV SOLN: 2.0000 mg | INTRAVENOUS | Status: DC | PRN

## 2020-01-16 MED ORDER — MORPHINE SULFATE (PF) 2 MG/ML IV SOLN: 2.0000 mg | INTRAVENOUS | Status: DC | PRN

## 2020-01-16 MED ORDER — ACETAMINOPHEN 325 MG RE SUPP
325.0000 mg | RECTAL | Status: DC | PRN
  Filled 2020-01-16: qty 2

## 2020-01-16 MED ORDER — HYDRALAZINE HCL 20 MG/ML IJ SOLN: 5.0000 mg | INTRAMUSCULAR | Status: DC | PRN

## 2020-01-16 MED ORDER — OXYCODONE-ACETAMINOPHEN 5-325 MG PO TABS
1.0000 | ORAL_TABLET | ORAL | Status: DC | PRN
  Administered 2020-01-16 – 2020-01-17 (×4): 2 via ORAL
  Filled 2020-01-16 (×4): qty 2

## 2020-01-16 MED ORDER — HEPARIN (PORCINE) 25000 UT/250ML-% IV SOLN
1500.0000 [IU]/h | INTRAVENOUS | Status: DC
  Administered 2020-01-16 (×2): 1600 [IU]/h via INTRAVENOUS
  Filled 2020-01-16 (×2): qty 250

## 2020-01-16 MED ORDER — GUAIFENESIN-DM 100-10 MG/5ML PO SYRP: 15.0000 mL | ORAL_SOLUTION | ORAL | Status: DC | PRN

## 2020-01-16 MED ORDER — LABETALOL HCL 5 MG/ML IV SOLN: 10.0000 mg | INTRAVENOUS | Status: DC | PRN

## 2020-01-16 MED ORDER — ALUM & MAG HYDROXIDE-SIMETH 200-200-20 MG/5ML PO SUSP: 15.0000 mL | ORAL | Status: DC | PRN

## 2020-01-16 NOTE — Progress Notes (Signed)
ANTICOAGULATION CONSULT NOTE - Initial Consult  Pharmacy Consult for heparin Indication: DVT  Allergies  Allergen Reactions  . Erythromycin Base Anxiety  . Xanax [Alprazolam] Other (See Comments)    Tachycardia  . Benzodiazepines     PT STATES HE CANNOT TAKE THESE BECAUSE THEY ALMOST KILLED HIM  . Methocarbamol Anxiety and Other (See Comments)    Stomach cramping, weakness  . Sulfa Antibiotics Nausea And Vomiting and Other (See Comments)    Cramping in stomach and hyperventilate.     Patient Measurements: Height: 5\' 11"  (180.3 cm) Weight: 100 kg (220 lb 7.4 oz) IBW/kg (Calculated) : 75.3 Heparin Dosing Weight: 95.9 kg  Vital Signs: Temp: 97.7 F (36.5 C) (07/15 0606) Temp Source: Oral (07/15 0606) BP: 137/82 (07/15 0615) Pulse Rate: 63 (07/15 0615)  Labs: Recent Labs    01/15/20 2133  HGB 16.8  HCT 49.3  PLT 147*  CREATININE 0.72    Estimated Creatinine Clearance: 118.3 mL/min (by C-G formula based on SCr of 0.72 mg/dL).   Medical History: Past Medical History:  Diagnosis Date  . Arthritis   . Blood clot in vein   . Diabetes mellitus   . DVT (deep venous thrombosis) ([facility])   . Hypertension   . Neuropathy     Medications:  Scheduled:   Infusions:  . heparin 1,600 Units/hr (01/16/20 0914)    Assessment: 59 year old male presenting with R and L lower leg pain/numbness concern for DVTs, pending Dopplers. No anticoagulation reported on medication history. H/H within normal limits, platelets chronically low/stable.    Goal of Therapy:  Heparin level 0.3-0.7 units/ml Monitor platelets by anticoagulation protocol: Yes   Plan:  Give 4700 units bolus x 1 Start heparin infusion at 1600 units/hr Check anti-Xa level in 6 hours and daily while on heparin Continue to monitor H&H and platelets  66, PharmD PGY1 Pharmacy Resident 01/16/2020 9:20 AM

## 2020-01-16 NOTE — Progress Notes (Signed)
Pt received from ED. VSS. CHG completed. Pt oriented to room and unit. Call light in reach.  Versie Starks, RN

## 2020-01-16 NOTE — Progress Notes (Addendum)
ANTICOAGULATION CONSULT NOTE - Follow Up Consult  Pharmacy Consult for IV Heparin Indication: DVT  Allergies  Allergen Reactions  . Erythromycin Base Anxiety  . Xanax [Alprazolam] Other (See Comments)    Tachycardia  . Benzodiazepines     PT STATES HE CANNOT TAKE THESE BECAUSE THEY ALMOST KILLED HIM  . Methocarbamol Anxiety and Other (See Comments)    Stomach cramping, weakness  . Sulfa Antibiotics Nausea And Vomiting and Other (See Comments)    Cramping in stomach and hyperventilate.     Patient Measurements: Height: 5\' 11"  (180.3 cm) Weight: 89.7 kg (197 lb 12 oz) IBW/kg (Calculated) : 75.3 Heparin Dosing Weight: 95.9 kg  Vital Signs: Temp: 98.2 F (36.8 C) (07/15 1620) Temp Source: Oral (07/15 1620) BP: 142/91 (07/15 1620) Pulse Rate: 76 (07/15 1620)  Labs: Recent Labs    01/15/20 2133 01/16/20 1624  HGB 16.8  --   HCT 49.3  --   PLT 147*  --   HEPARINUNFRC  --  0.68  CREATININE 0.72  --     Estimated Creatinine Clearance: 104.6 mL/min (by C-G formula based on SCr of 0.72 mg/dL).   Medical History: Past Medical History:  Diagnosis Date  . Arthritis   . Blood clot in vein   . Diabetes mellitus   . DVT (deep venous thrombosis) ([facility])   . Hypertension   . Neuropathy     Assessment: 59 year old male presented with R and L lower leg pain/numbness concern for DVT. No anticoagulation was reported on medication history. Pharmacy was consulted to dose heparin for DVT.  Pt also with critical limb ischemia of bilateral lower extremities, with pain at rest (R leg more critical at this time, per VVS). 7/15 Duplex: 75-99% stenosis in the right R superficial femoral artery. Plan angiogram on Friday AM; possible intervention and possibly bypass.   Initial heparin level, ~7 hrs after heparin 4700 units IV bolus IV X 1, followed by heparin infusion at 1600 units/hr, was 0.68 units/ml, which is within the goal range for this pt. H/H WNL, platelets 147 (chronically  low/stable). Per RN, no issues with IV or bleeding observed.  Goal of Therapy:  Heparin level 0.3-0.7 units/ml Monitor platelets by anticoagulation protocol: Yes   Plan:  Continue heparin infusion at 1600 units/hr Check confirmatory heparin level in 6 hrs Monitor daily heparin level, CBC Monitor for signs/symptoms of bleeding F/U VVS plans F/U transition to oral anticoagulant when able  Friday, PharmD, BCPS, [facility] Clinical Pharmacist 01/16/2020 5:34 PM

## 2020-01-16 NOTE — H&P (Signed)
H&P    Reason for Consult: Concern for ischemic bilateral lower extremities with rest pain Referring Physician: ED MRN #:  [id number]  History of Present Illness: This is a 59 y.o. male with history of hypertension, diabetes, tobacco abuse that presents to the ED with complaints of bilateral lower extremity pain for the last 2 months with associated numbness. He states he has been to [place] and has seen the vascular surgeons there multiple times and was offered an arteriogram but for some reason this was never scheduled. There notes indicate he has been medically non-compliant and missed multiple appointments.  States he went to [facility] ED and got tired of waiting and had his roommate drive him here.  He states he has pain and numbness in the left foot for the last several months and has had the same pain and numbness in the right foot but the right foot has gotten worse over the last week. He denies any previous lower extremity interventions other than apparent vein strippings.  Past Medical History:  Diagnosis Date  . Arthritis   . Blood clot in vein   . Diabetes mellitus   . DVT (deep venous thrombosis) ([place])   . Hypertension   . Neuropathy     Past Surgical History:  Procedure Laterality Date  . APPENDECTOMY    . VEIN LIGATION AND STRIPPING     of left leg    Allergies  Allergen Reactions  . Erythromycin Base Anxiety  . Xanax [Alprazolam] Other (See Comments)    Tachycardia  . Benzodiazepines     PT STATES HE CANNOT TAKE THESE BECAUSE THEY ALMOST KILLED HIM  . Methocarbamol Anxiety and Other (See Comments)    Stomach cramping, weakness  . Sulfa Antibiotics Nausea And Vomiting and Other (See Comments)    Cramping in stomach and hyperventilate.     Prior to Admission medications   Medication Sig Start Date End Date Taking? Authorizing Provider  acetaminophen (TYLENOL) 325 MG tablet Take 650 mg by mouth every 6 (six) hours as needed.    [provider]   aspirin EC 81 MG tablet Take 1 tablet (81 mg total) by mouth daily. Patient not taking: Reported on 10/02/2019 10/23/18   Charlott Rakes, MD  atorvastatin (LIPITOR) 40 MG tablet Take 1 tablet (40 mg total) by mouth daily. 10/02/19   Iloabachie, Chioma E, NP  blood glucose meter kit and supplies KIT Dispense based on patient and insurance preference. Use up to four times daily as directed. (FOR ICD-9 250.00, 250.01). 08/15/19   Iloabachie, Chioma E, NP  buPROPion (WELLBUTRIN) 100 MG tablet Take 1 tablet (100 mg total) by mouth 2 (two) times daily. 09/10/19   Iloabachie, Chioma E, NP  busPIRone (BUSPAR) 10 MG tablet Take 1 tablet (10 mg total) by mouth 3 (three) times daily. 09/10/19   Iloabachie, Chioma E, NP  glimepiride (AMARYL) 4 MG tablet Take 1.5 tablets (6 mg total) by mouth daily with breakfast. 12/30/19 01/29/20  Iloabachie, Chioma E, NP  glucose blood (TRUE METRIX BLOOD GLUCOSE TEST) test strip Use as instructed Patient not taking: Reported on 09/04/2018 01/31/17   Tresa Garter, MD  metFORMIN (GLUCOPHAGE) 1000 MG tablet TAKE ONE TABLET BY MOUTH 2 TIMES A DAY WITH A MEAL Patient taking differently: Take 1,000 mg by mouth 2 (two) times daily with a meal.  10/09/19   Iloabachie, Chioma E, NP  metoprolol tartrate (LOPRESSOR) 50 MG tablet TAKE ONE TABLET BY MOUTH 2 TIMES A DAY Patient taking differently:  Take 50 mg by mouth 2 (two) times daily.  10/09/19   Iloabachie, Lonny Prude, NP    Social History   Socioeconomic History  . Marital status: Divorced    Spouse name: Not on file  . Number of children: 0  . Years of education: Not on file  . Highest education level: Not on file  Occupational History  . Not on file  Tobacco Use  . Smoking status: Current Every Day Smoker    Packs/day: 2.00    Years: 6.00    Pack years: 12.00    Types: Cigarettes  . Smokeless tobacco: Never Used  Vaping Use  . Vaping Use: Never used  Substance and Sexual Activity  . Alcohol use: No  . Drug use: No  .  Sexual activity: Not on file  Other Topics Concern  . Not on file  Social History Narrative   Social determinants screening completed 09/19/2019. HS      Patient was given resource for bus passes to link transit and non file tax for information for the stimulus check. [facility] referral made on 5/25 for future lab and follow up appointments at the clinic by scheduling ride coordination through [employer]. HS   Social Determinants of Health   Financial Resource Strain: High Risk  . Difficulty of Paying Living Expenses: Hard  Food Insecurity: No Food Insecurity  . Worried About Charity fundraiser in the Last Year: Never true  . Ran Out of Food in the Last Year: Never true  Transportation Needs: Unmet Transportation Needs  . Lack of Transportation (Medical): Yes  . Lack of Transportation (Non-Medical): Yes  Physical Activity: Insufficiently Active  . Days of Exercise per Week: 3 days  . Minutes of Exercise per Session: 30 min  Stress: Stress Concern Present  . Feeling of Stress : To some extent  Social Connections: Socially Isolated  . Frequency of Communication with Friends and Family: Never  . Frequency of Social Gatherings with Friends and Family: Never  . Attends Religious Services: Never  . Active Member of Clubs or Organizations: No  . Attends Archivist Meetings: Never  . Marital Status: Divorced  Human resources officer Violence: Not At Risk  . Fear of Current or Ex-Partner: No  . Emotionally Abused: No  . Physically Abused: No  . Sexually Abused: No     Family History  Problem Relation Age of Onset  . Heart disease Mother   . Diabetes Father   . Heart disease Father     ROS: _0  Positive   _1  Negative   _2  All sytems reviewed and are negative  Cardiovascular: _3  chest pain/pressure _4  palpitations _5  SOB lying flat _6  DOE _7  pain in legs while walking _8  pain in legs at rest _9  pain in legs at night _10  non-healing ulcers _11  hx of DVT _12  swelling in  legs  Pulmonary: _13  productive cough _14  asthma/wheezing _15  home O2  Neurologic: _16  weakness in _17  arms _18  legs _19  numbness in _20  arms _21  legs _22  hx of CVA _23  mini stroke _24 difficulty speaking or slurred speech _25  temporary loss of vision in one eye _26  dizziness  Hematologic: _27  hx of cancer _28  bleeding problems _29  problems with blood clotting easily  Endocrine:   _30  diabetes _31  thyroid disease  GI _32  vomiting blood _33  blood in stool  GU: _34  CKD/renal failure _35  HD--_36  M/W/F or _37  T/T/S _38  burning with urination _39  blood in urine  Psychiatric: _40  anxiety _41   depression  Musculoskeletal: _0  arthritis _1  joint pain  Integumentary: _2  rashes _3  ulcers  Constitutional: _4  fever _5  chills   Physical Examination  Vitals:   01/16/20 0606 01/16/20 0615  BP: (!) 146/90 137/82  Pulse: 68 63  Resp: 16 17  Temp: 97.7 F (36.5 C)   SpO2: 98% 95%   Body mass index is 30.75 kg/m.  General:  NAD Gait: Not observed HENT: WNL, normocephalic Pulmonary: normal non-labored breathing, without Rales, rhonchi,  wheezing Cardiac: regular, without  Murmurs, rubs or gallops Abdomen: soft, NT/ND, no masses Skin: without rashes Vascular Exam/Pulses: Palpable femoral pulses bilaterally No palpable popliteal or pedal pulses Left DP/PT monophasic signals Right peroneal monophasic at the ankle Motor intact Numbness to both feet Extremities: Left foot is warmer than right Musculoskeletal: no muscle wasting or atrophy  Neurologic: A&O X 3; Appropriate Affect ; SENSATION: diminished in toes bilaterally; MOTOR FUNCTION:  moving all extremities equally. Speech is fluent/normal   CBC    Component Value Date/Time   WBC 7.6 01/15/2020 2133   RBC 5.19 01/15/2020 2133   HGB 16.8 01/15/2020 2133   HGB 16.8 09/18/2019 1221   HCT 49.3 01/15/2020 2133   HCT 48.6 09/18/2019 1221   PLT 147 (L) 01/15/2020 2133   PLT 184 09/18/2019 1221   MCV 95.0 01/15/2020 2133   MCV 91  09/18/2019 1221   MCV 94 08/14/2013 1710   MCH 32.4 01/15/2020 2133   MCHC 34.1 01/15/2020 2133   RDW 12.5 01/15/2020 2133   RDW 11.8 09/18/2019 1221   RDW 12.9 08/14/2013 1710   LYMPHSABS 2.1 01/15/2020 2133   LYMPHSABS 1.6 09/18/2019 1221   LYMPHSABS 2.1 08/14/2013 1710   MONOABS 0.8 01/15/2020 2133   MONOABS 0.6 08/14/2013 1710   EOSABS 0.5 01/15/2020 2133   EOSABS 0.4 09/18/2019 1221   EOSABS 0.5 08/14/2013 1710   BASOSABS 0.1 01/15/2020 2133   BASOSABS 0.1 09/18/2019 1221   BASOSABS 0.1 08/14/2013 1710    BMET    Component Value Date/Time   NA 139 01/15/2020 2133   NA 138 09/18/2019 1221   NA 135 (L) 08/14/2013 1710   K 4.1 01/15/2020 2133   K 3.8 08/14/2013 1710   CL 102 01/15/2020 2133   CL 103 08/14/2013 1710   CO2 26 01/15/2020 2133   CO2 32 08/14/2013 1710   GLUCOSE 158 (H) 01/15/2020 2133   GLUCOSE 116 (H) 08/14/2013 1710   BUN 10 01/15/2020 2133   BUN 12 09/18/2019 1221   BUN 11 08/14/2013 1710   CREATININE 0.72 01/15/2020 2133   CREATININE 0.82 01/21/2016 1523   CALCIUM 9.8 01/15/2020 2133   CALCIUM 9.3 08/14/2013 1710   GFRNONAA >60 01/15/2020 2133   GFRNONAA >60 08/14/2013 1710   GFRNONAA >89 05/24/2013 1211   GFRAA >60 01/15/2020 2133   GFRAA >60 08/14/2013 1710   GFRAA >89 05/24/2013 1211    COAGS: No results found for: INR, PROTIME   Non-Invasive Vascular Imaging:    ABI's 0.23 right and 0.69 left  Monophasic flow right peroneal at ankle with proximal SFA disease and distal SFA/pop occlusion and no flow in AT or PT  ASSESSMENT/PLAN: This is a 59 y.o. male presents with critical limb ischemia of the bilateral lower extremities with rest pain. This has been ongoing for several months in the setting of chronic short distance claudication prior to development of his rest pain. I think his right leg is more pressing at this time given he has had worsening symptoms in  the right foot over the last week in the setting of months of rest pain and  numbness.  He does have a monophasic peroneal signal on the right. He has more brisk dorsalis pedis signal on the left with a monophasic PT.  Motor intact.  Will need angiogram and possible intervention and possible bypass. Focus will be on the right leg at this time.  Likely staged intervention on the left leg later.  In review of the notes appears he has been medically noncompliant after evaluation by the vascular surgeons at [facility] and has no showed multiple appointments. Discussed this is now a limb threatening situation and high risk for limb loss.  Will schedule angiogram in the am with me since he ate this morning in the ED.  NPO after midnight.  Continue heparin.    Marty Heck, MD [facility] [place] Office: [phone]

## 2020-01-16 NOTE — ED Provider Notes (Signed)
This patient is a 59 year old male, has what appears to be poor blood flow from his legs, he is complaining of symptoms that go of his legs all the way to his hips and an anginal type pattern, getting worse over time, severe this morning, this is been getting worse over the last couple of months, on exam he has discoloration of his right foot which appears to be an increased red appearance, it is cool to the touch, I cannot detect pulses clinically on either foot, Doppler will be needed.  He has already been told that he probably needs to have vascular evaluation but was unwilling to stay in his prior hospital visit.  The patient is a long-term smoker, heart and lung exams unremarkable, vital signs unremarkable.  No dopplerable pulses on the R foot - L foot are dopplerable.  Labs unremarkable, Covid pending Heparin ordered at request of vascular surgery Dr. Chestine Spore will be consulted  .Critical Care Performed by: Eber Hong, MD Authorized by: Eber Hong, MD   Critical care provider statement:    Critical care time (minutes):  35   Critical care time was exclusive of:  Separately billable procedures and treating other patients and teaching time   Critical care was necessary to treat or prevent imminent or life-threatening deterioration of the following conditions:  Circulatory failure   Critical care was time spent personally by me on the following activities:  Blood draw for specimens, development of treatment plan with patient or surrogate, discussions with consultants, evaluation of patient's response to treatment, examination of patient, obtaining history from patient or surrogate, ordering and performing treatments and interventions, ordering and review of laboratory studies, ordering and review of radiographic studies, pulse oximetry, re-evaluation of patient's condition and review of old charts   I suspect the patient is severe claudication and now some arterial obstruction.  Medical  screening examination/treatment/procedure(s) were conducted as a shared visit with non-physician practitioner(s) and myself.  I personally evaluated the patient during the encounter.  Clinical Impression:   Final diagnoses:  Claudication [facility])         Eber Hong, MD 01/17/20 [phone]

## 2020-01-16 NOTE — Progress Notes (Signed)
Right lower ext. Arterial duplex and ABI completed. Refer to [facility] under chart review to view preliminary results.   01/16/2020  12:54 PM Ineze Serrao, Gerarda Gunther

## 2020-01-16 NOTE — ED Provider Notes (Signed)
[facility] EMERGENCY DEPARTMENT Provider Note   CSN: [id number] Arrival date & time: 01/15/20  1951     History Chief Complaint  Patient presents with  . Leg Pain    [name] is a 59 y.o. male.  Pt reports he has tingling and numbness to his legs.  Pt was seen last month at [facility] ED.  Pt reports he was suppose to have a study but his living arrangements cahnged   The history is provided by the patient. No language interpreter was used.  Leg Pain Location:  Leg Injury: no   Leg location:  L leg and R leg Pain details:    Quality:  Aching   Severity:  Moderate   Onset quality:  Gradual Foreign body present:  No foreign bodies      Past Medical History:  Diagnosis Date  . Arthritis   . Blood clot in vein   . Diabetes mellitus   . DVT (deep venous thrombosis) ([place])   . Hypertension   . Neuropathy     Patient Active Problem List   Diagnosis Date Noted  . Needs assistance with community resources 09/11/2019  . Peripheral vascular disease ([place]) 10/23/2018  . Neuropathic pain of ankle, left 01/25/2017  . Adjustment disorder with mixed anxiety and depressed mood 08/27/2016  . Hypertriglyceridemia 06/30/2016  . Thrombocytopenia, unspecified ([place]) 12/06/2012  . Diabetic neuropathy ([place]) 12/05/2012  . Tobacco use disorder 12/05/2012  . Dyslipidemia 12/05/2012  . Type 2 diabetes mellitus ([place]) 05/11/2011  . Essential hypertension 05/11/2011    Past Surgical History:  Procedure Laterality Date  . APPENDECTOMY    . VEIN LIGATION AND STRIPPING     of left leg       Family History  Problem Relation Age of Onset  . Heart disease Mother   . Diabetes Father   . Heart disease Father     Social History   Tobacco Use  . Smoking status: Current Every Day Smoker    Packs/day: 2.00    Years: 6.00    Pack years: 12.00    Types: Cigarettes  . Smokeless tobacco: Never Used  Vaping Use  . Vaping Use: Never used  Substance Use Topics  .  Alcohol use: No  . Drug use: No    Home Medications Prior to Admission medications   Medication Sig Start Date End Date Taking? Authorizing Provider  acetaminophen (TYLENOL) 325 MG tablet Take 650 mg by mouth every 6 (six) hours as needed.    [provider]  aspirin EC 81 MG tablet Take 1 tablet (81 mg total) by mouth daily. Patient not taking: Reported on 10/02/2019 10/23/18   Charlott Rakes, MD  atorvastatin (LIPITOR) 40 MG tablet Take 1 tablet (40 mg total) by mouth daily. 10/02/19   Iloabachie, Chioma E, NP  blood glucose meter kit and supplies KIT Dispense based on patient and insurance preference. Use up to four times daily as directed. (FOR ICD-9 250.00, 250.01). 08/15/19   Iloabachie, Chioma E, NP  buPROPion (WELLBUTRIN) 100 MG tablet Take 1 tablet (100 mg total) by mouth 2 (two) times daily. 09/10/19   Iloabachie, Chioma E, NP  busPIRone (BUSPAR) 10 MG tablet Take 1 tablet (10 mg total) by mouth 3 (three) times daily. 09/10/19   Iloabachie, Chioma E, NP  glimepiride (AMARYL) 4 MG tablet Take 1.5 tablets (6 mg total) by mouth daily with breakfast. 12/30/19 01/29/20  Iloabachie, Chioma E, NP  glucose blood (TRUE METRIX BLOOD GLUCOSE  TEST) test strip Use as instructed Patient not taking: Reported on 09/04/2018 01/31/17   Tresa Garter, MD  metFORMIN (GLUCOPHAGE) 1000 MG tablet TAKE ONE TABLET BY MOUTH 2 TIMES A DAY WITH A MEAL 10/09/19   Iloabachie, Chioma E, NP  metoprolol tartrate (LOPRESSOR) 50 MG tablet TAKE ONE TABLET BY MOUTH 2 TIMES A DAY 10/09/19   Iloabachie, Chioma E, NP    Allergies    Erythromycin base, Xanax [alprazolam], Benzodiazepines, Methocarbamol, and Sulfa antibiotics  Review of Systems   Review of Systems  All other systems reviewed and are negative.   Physical Exam Updated Vital Signs BP 137/82   Pulse 63   Temp 97.7 F (36.5 C) (Oral)   Resp 17   Ht _0  (1.803 m)   Wt 100 kg   SpO2 95%   BMI 30.75 kg/m   Physical Exam Vitals and nursing note  reviewed.  Constitutional:      Appearance: He is well-developed.  HENT:     Head: Normocephalic and atraumatic.  Eyes:     Conjunctiva/sclera: Conjunctivae normal.  Cardiovascular:     Rate and Rhythm: Normal rate.     Heart sounds: No murmur heard.   Pulmonary:     Effort: Pulmonary effort is normal. No respiratory distress.  Abdominal:     Tenderness: There is no abdominal tenderness.  Musculoskeletal:        General: Normal range of motion.  Skin:    General: Skin is dry.     Comments: Left foot feels cold,  Right foot warm  Left foot decreased color,  Decreased cap refill I can not appreciate pulse in either leg,  RN attempted doppler with out success   Neurological:     General: No focal deficit present.     Mental Status: He is alert.  Psychiatric:        Mood and Affect: Mood normal.     ED Results / Procedures / Treatments   Labs (all labs ordered are listed, but only abnormal results are displayed) Labs Reviewed  COMPREHENSIVE METABOLIC PANEL - Abnormal; Notable for the following components:      Result Value   Glucose, Bld 158 (*)    All other components within normal limits  CBC WITH DIFFERENTIAL/PLATELET - Abnormal; Notable for the following components:   Platelets 147 (*)    All other components within normal limits    EKG None  Radiology No results found.  Procedures Procedures (including critical care time)  Medications Ordered in ED Medications - No data to display  ED Course  I have reviewed the triage vital signs and the nursing notes.  Pertinent labs & imaging results that were available during my care of the patient were reviewed by me and considered in my medical decision making (see chart for details).    MDM Rules/Calculators/A&P                         MDM:  I spoke with Dr. Carlis Abbott Vascular surgery who will admit pt.  Heparin ordered.  Covid ordered   Final Clinical Impression(s) / ED Diagnoses Final diagnoses:  Claudication  [facility])    Rx / [place] Orders ED Discharge Orders    None       Sidney Ace 01/16/20 [id number]    Noemi Chapel, MD 01/17/20 [phone]

## 2020-01-17 ENCOUNTER — Inpatient Hospital Stay (HOSPITAL_COMMUNITY)
Admission: EM | Disposition: A | Discharge status: Home / Self Care | Payer: Self-pay | Source: Home / Self Care | Attending: Vascular Surgery

## 2020-01-17 LAB — CBC
HCT: 51.2 % (ref 39.0–52.0)
Hemoglobin: 17 g/dL (ref 13.0–17.0)
MCH: 31.6 pg (ref 26.0–34.0)
MCHC: 33.2 g/dL (ref 30.0–36.0)
MCV: 95.2 fL (ref 80.0–100.0)
Platelets: 132 10*3/uL — ABNORMAL LOW (ref 150–400)
RBC: 5.38 MIL/uL (ref 4.22–5.81)
RDW: 12.5 % (ref 11.5–15.5)
WBC: 6.7 10*3/uL (ref 4.0–10.5)
nRBC: 0 % (ref 0.0–0.2)

## 2020-01-17 LAB — BASIC METABOLIC PANEL
Anion gap: 7 (ref 5–15)
BUN: 12 mg/dL (ref 6–20)
CO2: 29 mmol/L (ref 22–32)
Calcium: 9.6 mg/dL (ref 8.9–10.3)
Chloride: 104 mmol/L (ref 98–111)
Creatinine, Ser: 0.97 mg/dL (ref 0.61–1.24)
GFR calc Af Amer: >60 mL/min (ref >60)
GFR calc non Af Amer: >60 mL/min (ref >60)
Glucose, Bld: 120 mg/dL — ABNORMAL HIGH (ref 70–99)
Potassium: 4.8 mmol/L (ref 3.5–5.1)
Sodium: 140 mmol/L (ref 135–145)

## 2020-01-17 LAB — HEPARIN LEVEL (UNFRACTIONATED): Heparin Unfractionated: 0.73 IU/mL — ABNORMAL HIGH (ref 0.30–0.70)

## 2020-01-17 SURGERY — INVASIVE LAB ABORTED CASE

## 2020-01-17 MED ORDER — HEPARIN (PORCINE) IN NACL 1000-0.9 UT/500ML-% IV SOLN
INTRAVENOUS | Status: DC | PRN
  Administered 2020-01-17: 500 mL

## 2020-01-17 MED ORDER — SODIUM CHLORIDE 0.9 % IV SOLN: INTRAVENOUS | Status: DC

## 2020-01-17 MED ORDER — LIDOCAINE HCL (PF) 1 % IJ SOLN
INTRAMUSCULAR | Status: AC
  Filled 2020-01-17: qty 30

## 2020-01-17 SURGICAL SUPPLY — 7 items
KIT MICROPUNCTURE NIT STIFF (SHEATH) ×3 IMPLANT
KIT PV (KITS) ×4 IMPLANT
SHEATH PINNACLE 5F 10CM (SHEATH) ×3 IMPLANT
SYR MEDRAD MARK 7 150ML (SYRINGE) ×4 IMPLANT
TRANSDUCER W/STOPCOCK (MISCELLANEOUS) ×4 IMPLANT
TRAY PV CATH (CUSTOM PROCEDURE TRAY) ×4 IMPLANT
WIRE BENTSON .035X145CM (WIRE) ×3 IMPLANT

## 2020-01-17 NOTE — Progress Notes (Signed)
ANTICOAGULATION CONSULT NOTE - Follow Up Consult  Pharmacy Consult for IV Heparin Indication: DVT  Allergies  Allergen Reactions  . Erythromycin Base Anxiety  . Xanax [Alprazolam] Other (See Comments)    Tachycardia  . Benzodiazepines     PT STATES HE CANNOT TAKE THESE BECAUSE THEY ALMOST KILLED HIM  . Methocarbamol Anxiety and Other (See Comments)    Stomach cramping, weakness  . Sulfa Antibiotics Nausea And Vomiting and Other (See Comments)    Cramping in stomach and hyperventilate.     Patient Measurements: Height: 5\' 11"  (180.3 cm) Weight: 89.7 kg (197 lb 12 oz) IBW/kg (Calculated) : 75.3 Heparin Dosing Weight: 95.9 kg  Vital Signs: Temp: 97.7 F (36.5 C) (07/16 0015) Temp Source: Oral (07/16 0015) BP: 128/81 (07/16 0015) Pulse Rate: 62 (07/16 0015)  Labs: Recent Labs    01/15/20 2133 01/16/20 1624 01/17/20 0108  HGB 16.8  --  17.0  HCT 49.3  --  51.2  PLT 147*  --  132*  HEPARINUNFRC  --  0.68 0.73*  CREATININE 0.72  --  0.97    Estimated Creatinine Clearance: 86.3 mL/min (by C-G formula based on SCr of 0.97 mg/dL).   Assessment: 59 year old male presented with R and L lower leg pain/numbness concern for DVT. No anticoagulation was reported on medication history. Pharmacy was consulted to dose heparin for DVT.  Pt also with critical limb ischemia of bilateral lower extremities, with pain at rest (R leg more critical at this time, per VVS). 7/15 Duplex: 75-99% stenosis in the right R superficial femoral artery. Plan angiogram on Friday AM; possible intervention and possibly bypass.   Heparin level now up to slightly supratherapeutic (0.73) on gtt at 1600 units/hr. No bleeding noted.  Goal of Therapy:  Heparin level 0.3-0.7 units/ml Monitor platelets by anticoagulation protocol: Yes   Plan:  Decrease heparin infusion to 1500 units/hr Will f/u 6 hr heparin level  Saturday, PharmD, BCPS Please see amion for complete clinical pharmacist phone  list 01/17/2020 2:33 AM

## 2020-01-17 NOTE — Progress Notes (Signed)
Pt back to 4E after scheduled angiogram in cath lab discontinued due to pt having extreme anxiety.  He was still in tears citing regret that he didn't have a close friend accompany him during stay for emotional support.  He has a friend he is attempting to contact that he is confident will be able to pick him up, but may be later today.  Pt desires to be discharged to main lobby to wait for friend.  Discharge orders received, paper work reviewed with patient, no further needs at this time.  Pt to be discharged to main lobby.

## 2020-01-17 NOTE — Progress Notes (Signed)
[facility] [place]  Subjective  - bilateral lower extremity rest pain, worse in right foot, ongoing for 2+ months   Objective (!) 142/78 80 97.8 F (36.6 C) (Oral) 15 99%  Intake/Output Summary (Last 24 hours) at 01/17/2020 0749 Last data filed at 01/16/2020 1700 Gross per 24 hour  Intake 289.28 ml  Output --  Net 289.28 ml    Palpable femoral pulses Left DP/PT signal Right peroneal signal Motor intact  Laboratory Lab Results: Recent Labs    01/15/20 2133 01/17/20 0108  WBC 7.6 6.7  HGB 16.8 17.0  HCT 49.3 51.2  PLT 147* 132*   BMET Recent Labs    01/15/20 2133 01/17/20 0108  NA 139 140  K 4.1 4.8  CL 102 104  CO2 26 29  GLUCOSE 158* 120*  BUN 10 12  CREATININE 0.72 0.97  CALCIUM 9.8 9.6    COAG No results found for: INR, PROTIME No results found for: PTT  Assessment/Planning:  Plan aortogram, bilateral lower extremity arteriogram, possible intervention.  Focus on the right leg today and discussed if no endovascular options may require bypass if target.   Cephus Shelling 01/17/2020 7:49 AM --

## 2020-01-17 NOTE — Progress Notes (Signed)
59 year old male who was admitted from the ED yesterday with bilateral lower extremity rest pain over the last several months.  Symptoms are worse in the right leg and only has an ABI of 0.2 with only monophasic peroneal signal.  He has been seen at [facility] by the vascular surgeons and offered arteriogram but per their documentation has been medically noncompliant and no showed multiple appointments.  I admitted him here and scheduled for arteriogram this morning after discussing risks and benefits in detail.  He was placed on the procedure table to start the procedure became very emotional stated he did not want to have the procedure and wanted to be discharged.  I discussed with him that he has critical limb ischemia and is at exceedingly high risk for limb loss without intervention.  He states he understands this and would prefer to go back to [place] and have his procedure closer to home.  He wants to be discharged.  States he has a roommate that can come pick him up.  He again reinforced declining the procedure and wanting to be taken off the table.  Will plan for discharge.  Again discussed likely will be facing amputation in the near future.  I offered him follow-up as needed with [place] if he prefers to follow-up with the vascular surgeons at [facility].  Cephus Shelling, MD [facility] [place] Office: [phone]   Cephus Shelling

## 2020-01-17 NOTE — Discharge Summary (Signed)
Discharge Summary    [name] [birth year] 59 y.o. male  [id number]  Admission Date: 01/15/2020  Discharge Date: 01/17/2020  Physician: Marty Heck, MD  Admission Diagnosis: Claudication [facility]) [I73.9] Ischemia of lower extremity [I99.8]   HPI:   This is a 58 y.o. male with history of hypertension, diabetes, tobacco abuse that presents to the ED with complaints of bilateral lower extremity pain for the last 2 months with associated numbness. He states he has been to [place] and has seen the vascular surgeons there multiple times and was offered an arteriogram but for some reason this was never scheduled. There notes indicate he has been medically non-compliant and missed multiple appointments.  States he went to [facility] ED and got tired of waiting and had his roommate drive him here.  He states he has pain and numbness in the left foot for the last several months and has had the same pain and numbness in the right foot but the right foot has gotten worse over the last week. He denies any previous lower extremity interventions other than apparent vein strippings.  Hospital Course:  The patient was admitted to the hospital, started on heparin gtt.  ABI's were obtained and right 0.23 and left 0.69.  Arterial duplex as follows: +-----------+--------+-----+---------------+----------+--------+  RIGHT   PSV cm/sRatioStenosis    Waveform Comments  +-----------+--------+-----+---------------+----------+--------+  CFA Distal 90              biphasic       +-----------+--------+-----+---------------+----------+--------+  SFA Prox  345   12  75-99% stenosisbiphasic       +-----------+--------+-----+---------------+----------+--------+  SFA Mid  27              monophasic      +-----------+--------+-----+---------------+----------+--------+  SFA Distal 0       occluded                +-----------+--------+-----+---------------+----------+--------+  POP Prox  0       occluded               +-----------+--------+-----+---------------+----------+--------+  POP Distal 29              monophasic      +-----------+--------+-----+---------------+----------+--------+  TP Trunk  29              monophasic      +-----------+--------+-----+---------------+----------+--------+  ATA Prox         occluded               +-----------+--------+-----+---------------+----------+--------+  ATA Mid         occluded               +-----------+--------+-----+---------------+----------+--------+  ATA Distal        occluded               +-----------+--------+-----+---------------+----------+--------+  PTA Prox  0       occluded               +-----------+--------+-----+---------------+----------+--------+  PTA Mid         occluded               +-----------+--------+-----+---------------+----------+--------+  PTA Distal        occluded               +-----------+--------+-----+---------------+----------+--------+  PERO Prox 8               monophasic      +-----------+--------+-----+---------------+----------+--------+  PERO Mid  12  monophasic      +-----------+--------+-----+---------------+----------+--------+  PERO Distal15              monophasic      +-----------+--------+-----+---------------+----------+--------+   Summary:  Right: 75-99% stenosis in the right proximal superficial femoral artery.  The right distal superficial femoral artery into the proximal popliteal  artery demonstrated occlusive disease. The calf appeared to have one vessel runoff via the peroneal artery.   The posterior tibial and anterior tibial arteries appeared occuded.   On 01/17/2020:   59 year old male who was admitted from the ED yesterday with bilateral lower extremity rest pain over the last several months.  Symptoms are worse in the right leg and only has an ABI of 0.2 with only monophasic peroneal signal.  He has been seen at [facility] by the vascular surgeons and offered arteriogram but per their documentation has been medically noncompliant and no showed multiple appointments.  I admitted him here and scheduled for arteriogram this morning after discussing risks and benefits in detail.  He was placed on the procedure table to start the procedure became very emotional stated he did not want to have the procedure and wanted to be discharged.  I discussed with him that he has critical limb ischemia and is at exceedingly high risk for limb loss without intervention.  He states he understands this and would prefer to go back to [place] and have his procedure closer to home.  He wants to be discharged.  States he has a roommate that can come pick him up.  He again reinforced declining the procedure and wanting to be taken off the table.  Will plan for discharge.  Again discussed likely will be facing amputation in the near future.  I offered him follow-up as needed with [place] if he prefers to follow-up with the vascular surgeons at [facility].  He is discharged.  CBC    Component Value Date/Time   WBC 6.7 01/17/2020 0108   RBC 5.38 01/17/2020 0108   HGB 17.0 01/17/2020 0108   HGB 16.8 09/18/2019 1221   HCT 51.2 01/17/2020 0108   HCT 48.6 09/18/2019 1221   PLT 132 (L) 01/17/2020 0108   PLT 184 09/18/2019 1221   MCV 95.2 01/17/2020 0108   MCV 91 09/18/2019 1221   MCV 94 08/14/2013 1710   MCH 31.6 01/17/2020 0108   MCHC 33.2 01/17/2020 0108   RDW 12.5 01/17/2020 0108   RDW 11.8 09/18/2019 1221   RDW 12.9 08/14/2013 1710   LYMPHSABS 2.1 01/15/2020 2133   LYMPHSABS 1.6 09/18/2019 1221    LYMPHSABS 2.1 08/14/2013 1710   MONOABS 0.8 01/15/2020 2133   MONOABS 0.6 08/14/2013 1710   EOSABS 0.5 01/15/2020 2133   EOSABS 0.4 09/18/2019 1221   EOSABS 0.5 08/14/2013 1710   BASOSABS 0.1 01/15/2020 2133   BASOSABS 0.1 09/18/2019 1221   BASOSABS 0.1 08/14/2013 1710    BMET    Component Value Date/Time   NA 140 01/17/2020 0108   NA 138 09/18/2019 1221   NA 135 (L) 08/14/2013 1710   K 4.8 01/17/2020 0108   K 3.8 08/14/2013 1710   CL 104 01/17/2020 0108   CL 103 08/14/2013 1710   CO2 29 01/17/2020 0108   CO2 32 08/14/2013 1710   GLUCOSE 120 (H) 01/17/2020 0108   GLUCOSE 116 (H) 08/14/2013 1710   BUN 12 01/17/2020 0108   BUN 12 09/18/2019 1221   BUN 11 08/14/2013 1710   CREATININE 0.97 01/17/2020 0108   CREATININE 0.82 01/21/2016  1523   CALCIUM 9.6 01/17/2020 0108   CALCIUM 9.3 08/14/2013 1710   GFRNONAA >60 01/17/2020 0108   GFRNONAA >60 08/14/2013 1710   GFRNONAA >89 05/24/2013 1211   GFRAA >60 01/17/2020 0108   GFRAA >60 08/14/2013 1710   GFRAA >89 05/24/2013 1211      Discharge Instructions    Discharge patient   Complete by: As directed    Discharge disposition: 01-Home or Self Care   Discharge patient date: 01/17/2020      Discharge Diagnosis:  Claudication [facility]) [I73.9] Ischemia of lower extremity [I99.8]  Secondary Diagnosis: Patient Active Problem List   Diagnosis Date Noted  . Ischemia of lower extremity 01/16/2020  . Needs assistance with community resources 09/11/2019  . Peripheral vascular disease ([place]) 10/23/2018  . Neuropathic pain of ankle, left 01/25/2017  . Adjustment disorder with mixed anxiety and depressed mood 08/27/2016  . Hypertriglyceridemia 06/30/2016  . Thrombocytopenia, unspecified ([place]) 12/06/2012  . Diabetic neuropathy ([place]) 12/05/2012  . Tobacco use disorder 12/05/2012  . Dyslipidemia 12/05/2012  . Type 2 diabetes mellitus ([place]) 05/11/2011  . Essential hypertension 05/11/2011   Past Medical History:  Diagnosis Date   . Arthritis   . Blood clot in vein   . Diabetes mellitus   . DVT (deep venous thrombosis) ([place])   . Hypertension   . Neuropathy      Allergies as of 01/17/2020      Reactions   Erythromycin Base Anxiety   Xanax [alprazolam] Other (See Comments)   Tachycardia   Benzodiazepines    PT STATES HE CANNOT TAKE THESE BECAUSE THEY ALMOST KILLED HIM   Methocarbamol Anxiety, Other (See Comments)   Stomach cramping, weakness   Sulfa Antibiotics Nausea And Vomiting, Other (See Comments)   Cramping in stomach and hyperventilate.       Medication List    TAKE these medications   acetaminophen 325 MG tablet Commonly known as: TYLENOL Take 650 mg by mouth every 6 (six) hours as needed.   aspirin EC 81 MG tablet Take 1 tablet (81 mg total) by mouth daily.   atorvastatin 40 MG tablet Commonly known as: LIPITOR Take 1 tablet (40 mg total) by mouth daily.   blood glucose meter kit and supplies Kit Dispense based on patient and insurance preference. Use up to four times daily as directed. (FOR ICD-9 250.00, 250.01). What changed:   how much to take  how to take this  when to take this   buPROPion 100 MG tablet Commonly known as: WELLBUTRIN Take 1 tablet (100 mg total) by mouth 2 (two) times daily.   busPIRone 10 MG tablet Commonly known as: BUSPAR Take 1 tablet (10 mg total) by mouth 3 (three) times daily.   glimepiride 4 MG tablet Commonly known as: AMARYL Take 1.5 tablets (6 mg total) by mouth daily with breakfast. What changed:   how much to take  when to take this   glucose blood test strip Commonly known as: True Metrix Blood Glucose Test Use as instructed   metFORMIN 1000 MG tablet Commonly known as: GLUCOPHAGE TAKE ONE TABLET BY MOUTH 2 TIMES A DAY WITH A MEAL What changed: See the new instructions.   metoprolol tartrate 50 MG tablet Commonly known as: LOPRESSOR TAKE ONE TABLET BY MOUTH 2 TIMES A DAY What changed: See the new instructions.   naproxen  sodium 220 MG tablet Commonly known as: ALEVE Take 220 mg by mouth daily.       Prescriptions given: None given.  Disposition: home  Patient's condition: is Good  Follow up: 1. [facility].   Leontine Locket, PA-C [facility] [phone] 01/17/2020  8:27 AM

## 2020-01-17 NOTE — Discharge Instructions (Signed)
Follow up with Vascular Surgery at [facility].

## 2020-01-20 MED FILL — Lidocaine HCl Local Preservative Free (PF) Inj 1%: INTRAMUSCULAR | Qty: 30 | Status: AC

## 2020-01-21 ENCOUNTER — Encounter (INDEPENDENT_AMBULATORY_CARE_PROVIDER_SITE_OTHER): Payer: Self-pay

## 2020-01-21 ENCOUNTER — Encounter (INDEPENDENT_AMBULATORY_CARE_PROVIDER_SITE_OTHER): Payer: Self-pay | Admitting: Nurse Practitioner

## 2020-01-21 ENCOUNTER — Telehealth (INDEPENDENT_AMBULATORY_CARE_PROVIDER_SITE_OTHER): Payer: Self-pay

## 2020-01-21 ENCOUNTER — Ambulatory Visit (INDEPENDENT_AMBULATORY_CARE_PROVIDER_SITE_OTHER): Payer: Self-pay | Admitting: Nurse Practitioner

## 2020-01-21 ENCOUNTER — Other Ambulatory Visit: Payer: Self-pay

## 2020-01-21 ENCOUNTER — Ambulatory Visit: Payer: Self-pay | Admitting: Gerontology

## 2020-01-21 ENCOUNTER — Encounter: Payer: Self-pay | Admitting: Gerontology

## 2020-01-21 VITALS — BP 153/93 | HR 80 | Ht 71.0 in | Wt 201.9 lb

## 2020-01-21 VITALS — BP 170/91 | HR 73 | Resp 19 | Ht 71.0 in | Wt 201.0 lb

## 2020-01-21 DIAGNOSIS — F17200 Nicotine dependence, unspecified, uncomplicated: Secondary | ICD-10-CM

## 2020-01-21 DIAGNOSIS — E785 Hyperlipidemia, unspecified: Secondary | ICD-10-CM

## 2020-01-21 DIAGNOSIS — I998 Other disorder of circulatory system: Secondary | ICD-10-CM

## 2020-01-21 DIAGNOSIS — E1110 Type 2 diabetes mellitus with ketoacidosis without coma: Secondary | ICD-10-CM

## 2020-01-21 DIAGNOSIS — E781 Pure hyperglyceridemia: Secondary | ICD-10-CM

## 2020-01-21 DIAGNOSIS — F4323 Adjustment disorder with mixed anxiety and depressed mood: Secondary | ICD-10-CM

## 2020-01-21 DIAGNOSIS — E1149 Type 2 diabetes mellitus with other diabetic neurological complication: Secondary | ICD-10-CM

## 2020-01-21 DIAGNOSIS — I10 Essential (primary) hypertension: Secondary | ICD-10-CM

## 2020-01-21 LAB — POCT GLYCOSYLATED HEMOGLOBIN (HGB A1C): Hemoglobin A1C: 10.6 % — AB (ref 4.0–5.6)

## 2020-01-21 MED ORDER — GLIMEPIRIDE 4 MG PO TABS: 4.0000 mg | ORAL_TABLET | Freq: Two times a day (BID) | ORAL | 0 refills | Status: DC

## 2020-01-21 MED ORDER — METFORMIN HCL 1000 MG PO TABS: 1000.0000 mg | ORAL_TABLET | Freq: Two times a day (BID) | ORAL | 2 refills | Status: DC

## 2020-01-21 MED ORDER — METOPROLOL TARTRATE 50 MG PO TABS: 50.0000 mg | ORAL_TABLET | Freq: Two times a day (BID) | ORAL | 2 refills | Status: DC

## 2020-01-21 NOTE — Progress Notes (Signed)
Subjective:    Patient ID: [name], male    DOB: 1960/12/16, 59 y.o.   MRN: [id number] Chief Complaint  Patient presents with  . Follow-up    The patient returns to the office for followup and review of the noninvasive studies. There has been a significant deterioration in the lower extremity symptoms.  The patient notes interval shortening of their claudication distance and development of rest pain symptoms. No new ulcers or wounds have occurred since the last visit.  The patient also endorses severe numbness of the right lower extremity.  The patient was originally at [facility] and was actually in the angiogram suite when he had a severe anxiety attack and actually stood up off of the procedural table.  The procedure had to be stopped and the patient left the [facility].  The patient is unable to tolerate the procedure at all while awake even with conscious sedation.   The patient denies amaurosis fugax or recent TIA symptoms. There are no recent neurological changes noted. The patient denies history of DVT, PE or superficial thrombophlebitis. The patient denies recent episodes of angina or shortness of breath.   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  The distal SFA into the proximal popliteal artery is occluded.  The anterior and posterior tibial arteries are also occluded.  The patient has dampened monophasic runoff of the peroneal artery.   Review of Systems  Cardiovascular:       Claudication  Musculoskeletal: Positive for back pain and myalgias.  Neurological: Positive for numbness.  All other systems reviewed and are negative.      Objective:   Physical Exam Vitals reviewed.  HENT:     Head: Normocephalic.  Cardiovascular:     Rate and Rhythm: Normal rate and regular rhythm.     Pulses: Decreased pulses.     Heart sounds: Normal heart sounds.  Pulmonary:      Effort: Pulmonary effort is normal.     Breath sounds: Normal breath sounds.  Neurological:     Mental Status: He is alert.  Psychiatric:        Attention and Perception: Attention normal.        Mood and Affect: Mood is anxious.        Speech: Speech normal.        Behavior: Behavior normal.        Thought Content: Thought content normal.        Cognition and Memory: Cognition and memory normal.        Judgment: Judgment normal.     BP (!) 170/91 (BP Location: Right Arm)   Pulse 73   Resp 19   Ht '5\' 11"'  (1.803 m)   Wt 201 lb (91.2 kg)   BMI 28.03 kg/m   Past Medical History:  Diagnosis Date  . Arthritis   . Blood clot in vein   . Diabetes mellitus   . DVT (deep venous thrombosis) ([place])   . Hypertension   . Neuropathy     Social History   Socioeconomic History  . Marital status: Divorced    Spouse name: Not on file  . Number of children: 0  . Years of education: Not on file  . Highest education level: Not on file  Occupational History  . Not on file  Tobacco Use  . Smoking status: Current Every Day Smoker  Packs/day: 2.00    Years: 6.00    Pack years: 12.00    Types: Cigarettes  . Smokeless tobacco: Never Used  Vaping Use  . Vaping Use: Never used  Substance and Sexual Activity  . Alcohol use: No  . Drug use: No  . Sexual activity: Not on file  Other Topics Concern  . Not on file  Social History Narrative   Social determinants screening completed 09/19/2019. HS      Patient was given resource for bus passes to link transit and non file tax for information for the stimulus check. [place] [address] referral made on 5/25 for future lab and follow up appointments at the clinic by scheduling ride coordination through [employer]. HS   Social Determinants of Health   Financial Resource Strain: High Risk  . Difficulty of Paying Living Expenses: Hard  Food Insecurity: No Food Insecurity  . Worried About Charity fundraiser in the Last Year: Never true  . Ran Out of  Food in the Last Year: Never true  Transportation Needs: Unmet Transportation Needs  . Lack of Transportation (Medical): Yes  . Lack of Transportation (Non-Medical): Yes  Physical Activity: Insufficiently Active  . Days of Exercise per Week: 3 days  . Minutes of Exercise per Session: 30 min  Stress: Stress Concern Present  . Feeling of Stress : To some extent  Social Connections: Socially Isolated  . Frequency of Communication with Friends and Family: Never  . Frequency of Social Gatherings with Friends and Family: Never  . Attends Religious Services: Never  . Active Member of Clubs or Organizations: No  . Attends Archivist Meetings: Never  . Marital Status: Divorced  Human resources officer Violence: Not At Risk  . Fear of Current or Ex-Partner: No  . Emotionally Abused: No  . Physically Abused: No  . Sexually Abused: No    Past Surgical History:  Procedure Laterality Date  . APPENDECTOMY    . VEIN LIGATION AND STRIPPING     of left leg    Family History  Problem Relation Age of Onset  . Heart disease Mother   . Diabetes Father   . Heart disease Father     Allergies  Allergen Reactions  . Erythromycin Base Anxiety  . Xanax [Alprazolam] Other (See Comments)    Tachycardia  . Benzodiazepines     PT STATES HE CANNOT TAKE THESE BECAUSE THEY ALMOST KILLED HIM  . Methocarbamol Anxiety and Other (See Comments)    Stomach cramping, weakness  . Sulfa Antibiotics Nausea And Vomiting and Other (See Comments)    Cramping in stomach and hyperventilate.        Assessment & Plan:   1. Ischemia of lower extremity Recommend:  The patient has evidence of severe atherosclerotic changes of both lower extremities with rest pain that is associated with preulcerative changes and impending tissue loss of the foot.  This represents a limb threatening ischemia and places the patient at the risk for limb loss.  I have also had a very frank discussion with the patient including  that if he does not proceed with angiogram the likelihood that he will lose his right lower extremity is extremely high.  Patient should undergo angiography of the lower extremities with the hope for intervention for limb salvage.  The risks and benefits as well as the alternative therapies was discussed in detail with the patient.  All questions were answered.  Patient agrees to proceed with angiography.  The patient will  follow up with me in the office after the procedure.    2. Essential hypertension Continue antihypertensive medications as already ordered, these medications have been reviewed and there are no changes at this time.   3. Hypertriglyceridemia Continue statin as ordered and reviewed, no changes at this time   4. Tobacco use disorder Smoking cessation was discussed, 3-10 minutes spent on this topic specifically   5. Adjustment disorder with mixed anxiety and depressed mood The patient has severe anxiety.  The previous attempted angiogram the patient actually left the table due to not being able to relax.  We will plan on having anesthesia during this procedure as well as all other follow-up if needed.   Current Outpatient Medications on File Prior to Visit  Medication Sig Dispense Refill  . acetaminophen (TYLENOL) 325 MG tablet Take 650 mg by mouth every 6 (six) hours as needed.     Marland Kitchen aspirin EC 81 MG tablet Take 1 tablet (81 mg total) by mouth daily. 30 tablet 3  . atorvastatin (LIPITOR) 40 MG tablet Take 1 tablet (40 mg total) by mouth daily. 30 tablet 3  . blood glucose meter kit and supplies KIT Dispense based on patient and insurance preference. Use up to four times daily as directed. (FOR ICD-9 250.00, 250.01). (Patient taking differently: Inject 1 each into the skin See admin instructions. Dispense based on patient and insurance preference. Use up to four times daily as directed. (FOR ICD-9 250.00, 250.01).) 1 each 0  . buPROPion (WELLBUTRIN) 100 MG tablet Take 1  tablet (100 mg total) by mouth 2 (two) times daily. 60 tablet 1  . busPIRone (BUSPAR) 10 MG tablet Take 1 tablet (10 mg total) by mouth 3 (three) times daily. 90 tablet 1  . glimepiride (AMARYL) 4 MG tablet Take 1.5 tablets (6 mg total) by mouth daily with breakfast. (Patient taking differently: Take 4 mg by mouth in the morning and at bedtime. ) 45 tablet 0  . glucose blood (TRUE METRIX BLOOD GLUCOSE TEST) test strip Use as instructed 100 each 12  . metFORMIN (GLUCOPHAGE) 1000 MG tablet TAKE ONE TABLET BY MOUTH 2 TIMES A DAY WITH A MEAL (Patient taking differently: Take 1,000 mg by mouth 2 (two) times daily with a meal. ) 60 tablet 0  . metoprolol tartrate (LOPRESSOR) 50 MG tablet TAKE ONE TABLET BY MOUTH 2 TIMES A DAY (Patient taking differently: Take 50 mg by mouth 2 (two) times daily. ) 60 tablet 0  . naproxen sodium (ALEVE) 220 MG tablet Take 220 mg by mouth daily.     No current facility-administered medications on file prior to visit.    There are no Patient Instructions on file for this visit. No follow-ups on file.   Kris Hartmann, NP

## 2020-01-21 NOTE — Progress Notes (Signed)
Established Patient Office Visit  Subjective:  Patient ID: [name], male    DOB: 16-Feb-1961  Age: 58 y.o. MRN: [id number]  CC:  Chief Complaint  Patient presents with  . Anxiety  . Medication Problem    HPI [name] presents for follow up of diabetes and tobacco abuse. He is non compliant with his follow up appointments. His HgbA1c done during visit increased from 10.2% to 10.6%. He states that he has not checked his blood glucose in one week because his glucometer was not working. His blood glucose was 189 mg/dl when checked during his visit. He denies hypoglycemia, states that he continues to make healthy lifestyle changes. Currently, he states that his peripheral neuropathy  worsening and he ambulates with a cane.   He was discharged from the hospital on 01/15/2020, during his hospital course, he was treated for claudication and ischemia to lower extremity. He was seen by [facility], Eulogio Ditch E NP on 01/21/2020 and was advised to follow up with Lower Extremity Angiography procedure on 01/27/2020. He states that he has not been taking his Atorvastatin because it was lost, but found it last night and promised to start taking it today. He continues to smoke 1 1/2 pack of cigarette daily and admits the desire to quit. He states that he's "a nervous wreck", that the only medicine that works for him is Buspar, but he has not picked it up from [employer]. He's states that he's nervous because of his upcoming surgery, but denies suicidal nor homicidal ideation. Overall, he states that he's doing well and offers no further complaint.  Past Medical History:  Diagnosis Date  . Arthritis   . Blood clot in vein   . Diabetes mellitus   . DVT (deep venous thrombosis) ([place])   . Hypertension   . Neuropathy     Past Surgical History:  Procedure Laterality Date  . APPENDECTOMY    . VEIN LIGATION AND STRIPPING     of left leg    Family History  Problem  Relation Age of Onset  . Heart disease Mother   . Diabetes Father   . Heart disease Father     Social History   Socioeconomic History  . Marital status: Divorced    Spouse name: Not on file  . Number of children: 0  . Years of education: Not on file  . Highest education level: Not on file  Occupational History  . Not on file  Tobacco Use  . Smoking status: Current Every Day Smoker    Packs/day: 2.00    Years: 6.00    Pack years: 12.00    Types: Cigarettes  . Smokeless tobacco: Never Used  . Tobacco comment: 1.5-2  Vaping Use  . Vaping Use: Never used  Substance and Sexual Activity  . Alcohol use: No  . Drug use: No  . Sexual activity: Not on file  Other Topics Concern  . Not on file  Social History Narrative   Social determinants screening completed 09/19/2019. HS      Patient was given resource for bus passes to link transit and non file tax for information for the stimulus check. [place] [facility] referral made on 5/25 for future lab and follow up appointments at the clinic by scheduling ride coordination through [employer]. HS   Social Determinants of Health   Financial Resource Strain: High Risk  . Difficulty of Paying Living Expenses: Hard  Food Insecurity:  No Food Insecurity  . Worried About Charity fundraiser in the Last Year: Never true  . Ran Out of Food in the Last Year: Never true  Transportation Needs: Unmet Transportation Needs  . Lack of Transportation (Medical): Yes  . Lack of Transportation (Non-Medical): Yes  Physical Activity: Insufficiently Active  . Days of Exercise per Week: 3 days  . Minutes of Exercise per Session: 30 min  Stress: Stress Concern Present  . Feeling of Stress : To some extent  Social Connections: Socially Isolated  . Frequency of Communication with Friends and Family: Never  . Frequency of Social Gatherings with Friends and Family: Never  . Attends Religious Services: Never  . Active Member of Clubs or Organizations: No  . Attends  Archivist Meetings: Never  . Marital Status: Divorced  Human resources officer Violence: Not At Risk  . Fear of Current or Ex-Partner: No  . Emotionally Abused: No  . Physically Abused: No  . Sexually Abused: No    Outpatient Medications Prior to Visit  Medication Sig Dispense Refill  . acetaminophen (TYLENOL) 325 MG tablet Take 650 mg by mouth every 6 (six) hours as needed.     Marland Kitchen aspirin EC 81 MG tablet Take 1 tablet (81 mg total) by mouth daily. 30 tablet 3  . blood glucose meter kit and supplies KIT Dispense based on patient and insurance preference. Use up to four times daily as directed. (FOR ICD-9 250.00, 250.01). (Patient taking differently: Inject 1 each into the skin See admin instructions. Dispense based on patient and insurance preference. Use up to four times daily as directed. (FOR ICD-9 250.00, 250.01).) 1 each 0  . glucose blood (TRUE METRIX BLOOD GLUCOSE TEST) test strip Use as instructed 100 each 12  . naproxen sodium (ALEVE) 220 MG tablet Take 220 mg by mouth daily.    Marland Kitchen glimepiride (AMARYL) 4 MG tablet Take 1.5 tablets (6 mg total) by mouth daily with breakfast. (Patient taking differently: Take 4 mg by mouth in the morning and at bedtime. ) 45 tablet 0  . metFORMIN (GLUCOPHAGE) 1000 MG tablet TAKE ONE TABLET BY MOUTH 2 TIMES A DAY WITH A MEAL (Patient taking differently: Take 1,000 mg by mouth 2 (two) times daily with a meal. ) 60 tablet 0  . metoprolol tartrate (LOPRESSOR) 50 MG tablet TAKE ONE TABLET BY MOUTH 2 TIMES A DAY (Patient taking differently: Take 50 mg by mouth 2 (two) times daily. ) 60 tablet 0  . atorvastatin (LIPITOR) 40 MG tablet Take 1 tablet (40 mg total) by mouth daily. (Patient not taking: Reported on 01/21/2020) 30 tablet 3  . buPROPion (WELLBUTRIN) 100 MG tablet Take 1 tablet (100 mg total) by mouth 2 (two) times daily. (Patient not taking: Reported on 01/21/2020) 60 tablet 1  . busPIRone (BUSPAR) 10 MG tablet Take 1 tablet (10 mg total) by mouth 3  (three) times daily. (Patient not taking: Reported on 01/21/2020) 90 tablet 1   No facility-administered medications prior to visit.    Allergies  Allergen Reactions  . Erythromycin Base Anxiety  . Xanax [Alprazolam] Other (See Comments)    Tachycardia  . Benzodiazepines     PT STATES HE CANNOT TAKE THESE BECAUSE THEY ALMOST KILLED HIM  . Methocarbamol Anxiety and Other (See Comments)    Stomach cramping, weakness  . Sulfa Antibiotics Nausea And Vomiting and Other (See Comments)    Cramping in stomach and hyperventilate.     ROS Review of Systems  Constitutional:  Negative.   Eyes: Negative.   Respiratory: Negative.   Cardiovascular: Negative.   Endocrine: Negative.   Neurological: Positive for numbness (peripheral neuropathy).  Psychiatric/Behavioral: The patient is nervous/anxious.       Objective:    Physical Exam HENT:     Head: Normocephalic and atraumatic.  Eyes:     Extraocular Movements: Extraocular movements intact.     Pupils: Pupils are equal, round, and reactive to light.  Cardiovascular:     Rate and Rhythm: Normal rate and regular rhythm.     Pulses: Normal pulses.     Heart sounds: Normal heart sounds.  Pulmonary:     Effort: Pulmonary effort is normal.     Breath sounds: Normal breath sounds.  Neurological:     Mental Status: He is alert and oriented to person, place, and time. Mental status is at baseline.  Psychiatric:        Mood and Affect: Mood normal.        Behavior: Behavior normal.        Thought Content: Thought content normal.        Judgment: Judgment normal.     BP (!) 153/93 (BP Location: Right Arm, Patient Position: Sitting)   Pulse 80   Ht '5\' 11"'  (1.803 m)   Wt 201 lb 14.4 oz (91.6 kg)   SpO2 99%   BMI 28.16 kg/m  Wt Readings from Last 3 Encounters:  01/21/20 201 lb 14.4 oz (91.6 kg)  01/21/20 201 lb (91.2 kg)  01/16/20 197 lb 12 oz (89.7 kg)     Health Maintenance Due  Topic Date Due  . Hepatitis C Screening  Never  done  . COVID-19 Vaccine (1) Never done  . TETANUS/TDAP  Never done  . COLONOSCOPY  Never done    There are no preventive care reminders to display for this patient.  Lab Results  Component Value Date   TSH 2.070 09/18/2019   Lab Results  Component Value Date   WBC 6.7 01/17/2020   HGB 17.0 01/17/2020   HCT 51.2 01/17/2020   MCV 95.2 01/17/2020   PLT 132 (L) 01/17/2020   Lab Results  Component Value Date   NA 140 01/17/2020   K 4.8 01/17/2020   CO2 29 01/17/2020   GLUCOSE 120 (H) 01/17/2020   BUN 12 01/17/2020   CREATININE 0.97 01/17/2020   BILITOT 0.7 01/15/2020   ALKPHOS 64 01/15/2020   AST 15 01/15/2020   ALT 15 01/15/2020   PROT 6.6 01/15/2020   ALBUMIN 4.2 01/15/2020   CALCIUM 9.6 01/17/2020   ANIONGAP 7 01/17/2020   Lab Results  Component Value Date   CHOL 191 09/18/2019   Lab Results  Component Value Date   HDL 37 (L) 09/18/2019   Lab Results  Component Value Date   LDLCALC 127 (H) 09/18/2019   Lab Results  Component Value Date   TRIG 151 (H) 09/18/2019   Lab Results  Component Value Date   CHOLHDL 5.2 (H) 09/18/2019   Lab Results  Component Value Date   HGBA1C 10.6 (A) 01/21/2020      Assessment & Plan:    1. Type 2 diabetes mellitus with other neurologic complication, without long-term current use of insulin (HCC) - His diabetes is not controlled, his HgbA1c is 10.6% and his goal should be less than 7%. He was advised to bring his meter to the be checked at the clinic tomorrow. He was advised to check blood glucose bid and fasting goal should  be between 80-130 mg/dl. He was encouraged to continue on low carb/non concentrated sweet diet. He declines Insulin therapy until after his Angiography procedure on 01/27/2020. - metFORMIN (GLUCOPHAGE) 1000 MG tablet; Take 1 tablet (1,000 mg total) by mouth 2 (two) times daily with a meal.  Dispense: 60 tablet; Refill: 2 - POCT HgB A1C - glimepiride (AMARYL) 4 MG tablet; Take 1 tablet (4 mg total) by  mouth in the morning and at bedtime.  Dispense: 60 tablet; Refill: 0   2. Essential hypertension - His blood pressure is not controlled, he will continue on current treatment regimen and advised to continue on DASH diet, exercise as tolerated and smoking cessation. - metoprolol tartrate (LOPRESSOR) 50 MG tablet; Take 1 tablet (50 mg total) by mouth 2 (two) times daily.  Dispense: 60 tablet; Refill: 2  3. Ischemia of lower extremity - He was strongly encouraged to follow up with his Angiography procedure on 01/27/2020.  4. Dyslipidemia - The 10-year ASCVD risk score Mikey Bussing DC Brooke Bonito., et al., 2013) is: 42.3%   Values used to calculate the score:     Age: 74 years     Sex: Male     Is Non-Hispanic African American: No     Diabetic: Yes     Tobacco smoker: Yes     Systolic Blood Pressure: 709 mmHg     Is BP treated: Yes     HDL Cholesterol: 37 mg/dL     Total Cholesterol: 191 mg/dL He is non compliant with his medication. His ASCVD risk is 42.3%, he promised to resume taking his 40 mg Atorvastatin. -He was advised to continue on Low fat Diet, like low fat dairy products eg skimmed milk -Avoid any fried food -Regular exercise/walk -Goal for Total Cholesterol is less than 200 -Goal for bad cholesterol LDL is less than 70 -Goal for Good cholesterol HDL is more than 45 -Goal for Triglyceride is less than 150    5. Tobacco use disorder - He was strongly encouraged on smoking cessation, was provided with [place] [facility]uitline information.    6. Adjustment disorder with mixed anxiety and depressed mood -He was advised to pick up Buspar from [facility] and follow up with Ms. Simpson and call Crisis Help line with worsening anxiety.   Follow-up: Return in about 29 days (around 02/19/2020), or if symptoms worsen or fail to improve.    Annarose Ouellet Jerold Coombe, NP

## 2020-01-21 NOTE — Patient Instructions (Signed)
DASH Eating Plan DASH stands for "Dietary Approaches to Stop Hypertension." The DASH eating plan is a healthy eating plan that has been shown to reduce high blood pressure (hypertension). It may also reduce your risk for type 2 diabetes, heart disease, and stroke. The DASH eating plan may also help with weight loss. What are tips for following this plan?  General guidelines  Avoid eating more than 2,300 mg (milligrams) of salt (sodium) a day. If you have hypertension, you may need to reduce your sodium intake to 1,500 mg a day.  Limit alcohol intake to no more than 1 drink a day for nonpregnant women and 2 drinks a day for men. One drink equals 12 oz of beer, 5 oz of wine, or 1 oz of hard liquor.  Work with your health care provider to maintain a healthy body weight or to lose weight. Ask what an ideal weight is for you.  Get at least 30 minutes of exercise that causes your heart to beat faster (aerobic exercise) most days of the week. Activities may include walking, swimming, or biking.  Work with your health care provider or diet and nutrition specialist (dietitian) to adjust your eating plan to your individual calorie needs. Reading food labels   Check food labels for the amount of sodium per serving. Choose foods with less than 5 percent of the Daily Value of sodium. Generally, foods with less than 300 mg of sodium per serving fit into this eating plan.  To find whole grains, look for the word "whole" as the first word in the ingredient list. Shopping  Buy products labeled as "low-sodium" or "no salt added."  Buy fresh foods. Avoid canned foods and premade or frozen meals. Cooking  Avoid adding salt when cooking. Use salt-free seasonings or herbs instead of table salt or sea salt. Check with your health care provider or pharmacist before using salt substitutes.  Do not fry foods. Cook foods using healthy methods such as baking, boiling, grilling, and broiling instead.  Cook with  heart-healthy oils, such as olive, canola, soybean, or sunflower oil. Meal planning  Eat a balanced diet that includes: ? 5 or more servings of fruits and vegetables each day. At each meal, try to fill half of your plate with fruits and vegetables. ? Up to 6-8 servings of whole grains each day. ? Less than 6 oz of lean meat, poultry, or fish each day. A 3-oz serving of meat is about the same size as a deck of cards. One egg equals 1 oz. ? 2 servings of low-fat dairy each day. ? A serving of nuts, seeds, or beans 5 times each week. ? Heart-healthy fats. Healthy fats called Omega-3 fatty acids are found in foods such as flaxseeds and coldwater fish, like sardines, salmon, and mackerel.  Limit how much you eat of the following: ? Canned or prepackaged foods. ? Food that is high in trans fat, such as fried foods. ? Food that is high in saturated fat, such as fatty meat. ? Sweets, desserts, sugary drinks, and other foods with added sugar. ? Full-fat dairy products.  Do not salt foods before eating.  Try to eat at least 2 vegetarian meals each week.  Eat more home-cooked food and less restaurant, buffet, and fast food.  When eating at a restaurant, ask that your food be prepared with less salt or no salt, if possible. What foods are recommended? The items listed may not be a complete list. Talk with your dietitian about   what dietary choices are best for you. Grains Whole-grain or whole-wheat bread. Whole-grain or whole-wheat pasta. Brown rice. Oatmeal. Quinoa. Bulgur. Whole-grain and low-sodium cereals. Pita bread. Low-fat, low-sodium crackers. Whole-wheat flour tortillas. Vegetables Fresh or frozen vegetables (raw, steamed, roasted, or grilled). Low-sodium or reduced-sodium tomato and vegetable juice. Low-sodium or reduced-sodium tomato sauce and tomato paste. Low-sodium or reduced-sodium canned vegetables. Fruits All fresh, dried, or frozen fruit. Canned fruit in natural juice (without  added sugar). Meat and other protein foods Skinless chicken or turkey. Ground chicken or turkey. Pork with fat trimmed off. Fish and seafood. Egg whites. Dried beans, peas, or lentils. Unsalted nuts, nut butters, and seeds. Unsalted canned beans. Lean cuts of beef with fat trimmed off. Low-sodium, lean deli meat. Dairy Low-fat (1%) or fat-free (skim) milk. Fat-free, low-fat, or reduced-fat cheeses. Nonfat, low-sodium ricotta or cottage cheese. Low-fat or nonfat yogurt. Low-fat, low-sodium cheese. Fats and oils Soft margarine without trans fats. Vegetable oil. Low-fat, reduced-fat, or light mayonnaise and salad dressings (reduced-sodium). Canola, safflower, olive, soybean, and sunflower oils. Avocado. Seasoning and other foods Herbs. Spices. Seasoning mixes without salt. Unsalted popcorn and pretzels. Fat-free sweets. What foods are not recommended? The items listed may not be a complete list. Talk with your dietitian about what dietary choices are best for you. Grains Baked goods made with fat, such as croissants, muffins, or some breads. Dry pasta or rice meal packs. Vegetables Creamed or fried vegetables. Vegetables in a cheese sauce. Regular canned vegetables (not low-sodium or reduced-sodium). Regular canned tomato sauce and paste (not low-sodium or reduced-sodium). Regular tomato and vegetable juice (not low-sodium or reduced-sodium). Pickles. Olives. Fruits Canned fruit in a light or heavy syrup. Fried fruit. Fruit in cream or butter sauce. Meat and other protein foods Fatty cuts of meat. Ribs. Fried meat. Bacon. Sausage. Bologna and other processed lunch meats. Salami. Fatback. Hotdogs. Bratwurst. Salted nuts and seeds. Canned beans with added salt. Canned or smoked fish. Whole eggs or egg yolks. Chicken or turkey with skin. Dairy Whole or 2% milk, cream, and half-and-half. Whole or full-fat cream cheese. Whole-fat or sweetened yogurt. Full-fat cheese. Nondairy creamers. Whipped toppings.  Processed cheese and cheese spreads. Fats and oils Butter. Stick margarine. Lard. Shortening. Ghee. Bacon fat. Tropical oils, such as coconut, palm kernel, or palm oil. Seasoning and other foods Salted popcorn and pretzels. Onion salt, garlic salt, seasoned salt, table salt, and sea salt. Worcestershire sauce. Tartar sauce. Barbecue sauce. Teriyaki sauce. Soy sauce, including reduced-sodium. Steak sauce. Canned and packaged gravies. Fish sauce. Oyster sauce. Cocktail sauce. Horseradish that you find on the shelf. Ketchup. Mustard. Meat flavorings and tenderizers. Bouillon cubes. Hot sauce and Tabasco sauce. Premade or packaged marinades. Premade or packaged taco seasonings. Relishes. Regular salad dressings. Where to find more information:  National Heart, Lung, and Blood Institute: www.nhlbi.nih.gov  American Heart Association: www.heart.org Summary  The DASH eating plan is a healthy eating plan that has been shown to reduce high blood pressure (hypertension). It may also reduce your risk for type 2 diabetes, heart disease, and stroke.  With the DASH eating plan, you should limit salt (sodium) intake to 2,300 mg a day. If you have hypertension, you may need to reduce your sodium intake to 1,500 mg a day.  When on the DASH eating plan, aim to eat more fresh fruits and vegetables, whole grains, lean proteins, low-fat dairy, and heart-healthy fats.  Work with your health care provider or diet and nutrition specialist (dietitian) to adjust your eating plan to your   individual calorie needs. This information is not intended to replace advice given to you by your health care provider. Make sure you discuss any questions you have with your health care provider. Document Revised: 06/02/2017 Document Reviewed: 06/13/2016 Elsevier Patient Education  2020 Elsevier Inc. Carbohydrate Counting for Diabetes Mellitus, Adult  Carbohydrate counting is a method of keeping track of how many carbohydrates you  eat. Eating carbohydrates naturally increases the amount of sugar (glucose) in the blood. Counting how many carbohydrates you eat helps keep your blood glucose within normal limits, which helps you manage your diabetes (diabetes mellitus). It is important to know how many carbohydrates you can safely have in each meal. This is different for every person. A diet and nutrition specialist (registered dietitian) can help you make a meal plan and calculate how many carbohydrates you should have at each meal and snack. Carbohydrates are found in the following foods:  Grains, such as breads and cereals.  Dried beans and soy products.  Starchy vegetables, such as potatoes, peas, and corn.  Fruit and fruit juices.  Milk and yogurt.  Sweets and snack foods, such as cake, cookies, candy, chips, and soft drinks. How do I count carbohydrates? There are two ways to count carbohydrates in food. You can use either of the methods or a combination of both. Reading "Nutrition Facts" on packaged food The "Nutrition Facts" list is included on the labels of almost all packaged foods and beverages in the [place] It includes:  The serving size.  Information about nutrients in each serving, including the grams (g) of carbohydrate per serving. To use the "Nutrition Facts":  Decide how many servings you will have.  Multiply the number of servings by the number of carbohydrates per serving.  The resulting number is the total amount of carbohydrates that you will be having. Learning standard serving sizes of other foods When you eat carbohydrate foods that are not packaged or do not include "Nutrition Facts" on the label, you need to measure the servings in order to count the amount of carbohydrates:  Measure the foods that you will eat with a food scale or measuring cup, if needed.  Decide how many standard-size servings you will eat.  Multiply the number of servings by 15. Most carbohydrate-rich foods have  about 15 g of carbohydrates per serving. ? For example, if you eat 8 oz (170 g) of strawberries, you will have eaten 2 servings and 30 g of carbohydrates (2 servings x 15 g = 30 g).  For foods that have more than one food mixed, such as soups and casseroles, you must count the carbohydrates in each food that is included. The following list contains standard serving sizes of common carbohydrate-rich foods. Each of these servings has about 15 g of carbohydrates:   hamburger bun or  English muffin.   oz (15 mL) syrup.   oz (14 g) jelly.  1 slice of bread.  1 six-inch tortilla.  3 oz (85 g) cooked rice or pasta.  4 oz (113 g) cooked dried beans.  4 oz (113 g) starchy vegetable, such as peas, corn, or potatoes.  4 oz (113 g) hot cereal.  4 oz (113 g) mashed potatoes or  of a large baked potato.  4 oz (113 g) canned or frozen fruit.  4 oz (120 mL) fruit juice.  4-6 crackers.  6 chicken nuggets.  6 oz (170 g) unsweetened dry cereal.  6 oz (170 g) plain fat-free yogurt or yogurt sweetened with   artificial sweeteners.  8 oz (240 mL) milk.  8 oz (170 g) fresh fruit or one small piece of fruit.  24 oz (680 g) popped popcorn. Example of carbohydrate counting Sample meal  3 oz (85 g) chicken breast.  6 oz (170 g) brown rice.  4 oz (113 g) corn.  8 oz (240 mL) milk.  8 oz (170 g) strawberries with sugar-free whipped topping. Carbohydrate calculation 1. Identify the foods that contain carbohydrates: ? Rice. ? Corn. ? Milk. ? Strawberries. 2. Calculate how many servings you have of each food: ? 2 servings rice. ? 1 serving corn. ? 1 serving milk. ? 1 serving strawberries. 3. Multiply each number of servings by 15 g: ? 2 servings rice x 15 g = 30 g. ? 1 serving corn x 15 g = 15 g. ? 1 serving milk x 15 g = 15 g. ? 1 serving strawberries x 15 g = 15 g. 4. Add together all of the amounts to find the total grams of carbohydrates eaten: ? 30 g + 15 g + 15 g + 15  g = 75 g of carbohydrates total. Summary  Carbohydrate counting is a method of keeping track of how many carbohydrates you eat.  Eating carbohydrates naturally increases the amount of sugar (glucose) in the blood.  Counting how many carbohydrates you eat helps keep your blood glucose within normal limits, which helps you manage your diabetes.  A diet and nutrition specialist (registered dietitian) can help you make a meal plan and calculate how many carbohydrates you should have at each meal and snack. This information is not intended to replace advice given to you by your health care provider. Make sure you discuss any questions you have with your health care provider. Document Revised: 01/12/2017 Document Reviewed: 12/02/2015 Elsevier Patient Education  2020 Elsevier Inc.  

## 2020-01-21 NOTE — Telephone Encounter (Signed)
Patient was seen today and scheduled for a RLE angio with anesthesia with Dr. Wyn Quaker for 01/27/20 with a 11:00 am arrival time to the [facility]. I spoke with Judeth Cornfield in anesthesia for the procedure. Covid testing is on 01/23/20 between 8-1 am at the [facility]. Pre-procedure instructions were discussed and handed to the patient.

## 2020-01-21 NOTE — H&P (View-Only) (Signed)
Subjective:    Patient ID: [name], male    DOB: 10-Nov-1960, 59 y.o.   MRN: [id number] Chief Complaint  Patient presents with  . Follow-up    The patient returns to the office for followup and review of the noninvasive studies. There has been a significant deterioration in the lower extremity symptoms.  The patient notes interval shortening of their claudication distance and development of rest pain symptoms. No new ulcers or wounds have occurred since the last visit.  The patient also endorses severe numbness of the right lower extremity.  The patient was originally at [facility] and was actually in the angiogram suite when he had a severe anxiety attack and actually stood up off of the procedural table.  The procedure had to be stopped and the patient left the [facility].  The patient is unable to tolerate the procedure at all while awake even with conscious sedation.   The patient denies amaurosis fugax or recent TIA symptoms. There are no recent neurological changes noted. The patient denies history of DVT, PE or superficial thrombophlebitis. The patient denies recent episodes of angina or shortness of breath.   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  The distal SFA into the proximal popliteal artery is occluded.  The anterior and posterior tibial arteries are also occluded.  The patient has dampened monophasic runoff of the peroneal artery.   Review of Systems  Cardiovascular:       Claudication  Musculoskeletal: Positive for back pain and myalgias.  Neurological: Positive for numbness.  All other systems reviewed and are negative.      Objective:   Physical Exam Vitals reviewed.  HENT:     Head: Normocephalic.  Cardiovascular:     Rate and Rhythm: Normal rate and regular rhythm.     Pulses: Decreased pulses.     Heart sounds: Normal heart sounds.  Pulmonary:      Effort: Pulmonary effort is normal.     Breath sounds: Normal breath sounds.  Neurological:     Mental Status: He is alert.  Psychiatric:        Attention and Perception: Attention normal.        Mood and Affect: Mood is anxious.        Speech: Speech normal.        Behavior: Behavior normal.        Thought Content: Thought content normal.        Cognition and Memory: Cognition and memory normal.        Judgment: Judgment normal.     BP (!) 170/91 (BP Location: Right Arm)   Pulse 73   Resp 19   Ht '5\' 11"'  (1.803 m)   Wt 201 lb (91.2 kg)   BMI 28.03 kg/m   Past Medical History:  Diagnosis Date  . Arthritis   . Blood clot in vein   . Diabetes mellitus   . DVT (deep venous thrombosis) ([place])   . Hypertension   . Neuropathy     Social History   Socioeconomic History  . Marital status: Divorced    Spouse name: Not on file  . Number of children: 0  . Years of education: Not on file  . Highest education level: Not on file  Occupational History  . Not on file  Tobacco Use  . Smoking status: Current Every Day Smoker  Packs/day: 2.00    Years: 6.00    Pack years: 12.00    Types: Cigarettes  . Smokeless tobacco: Never Used  Vaping Use  . Vaping Use: Never used  Substance and Sexual Activity  . Alcohol use: No  . Drug use: No  . Sexual activity: Not on file  Other Topics Concern  . Not on file  Social History Narrative   Social determinants screening completed 09/19/2019. HS      Patient was given resource for bus passes to link transit and non file tax for information for the stimulus check. [place] [employer] referral made on 5/25 for future lab and follow up appointments at the clinic by scheduling ride coordination through [employer]. HS   Social Determinants of Health   Financial Resource Strain: High Risk  . Difficulty of Paying Living Expenses: Hard  Food Insecurity: No Food Insecurity  . Worried About Charity fundraiser in the Last Year: Never true  . Ran Out of  Food in the Last Year: Never true  Transportation Needs: Unmet Transportation Needs  . Lack of Transportation (Medical): Yes  . Lack of Transportation (Non-Medical): Yes  Physical Activity: Insufficiently Active  . Days of Exercise per Week: 3 days  . Minutes of Exercise per Session: 30 min  Stress: Stress Concern Present  . Feeling of Stress : To some extent  Social Connections: Socially Isolated  . Frequency of Communication with Friends and Family: Never  . Frequency of Social Gatherings with Friends and Family: Never  . Attends Religious Services: Never  . Active Member of Clubs or Organizations: No  . Attends Archivist Meetings: Never  . Marital Status: Divorced  Human resources officer Violence: Not At Risk  . Fear of Current or Ex-Partner: No  . Emotionally Abused: No  . Physically Abused: No  . Sexually Abused: No    Past Surgical History:  Procedure Laterality Date  . APPENDECTOMY    . VEIN LIGATION AND STRIPPING     of left leg    Family History  Problem Relation Age of Onset  . Heart disease Mother   . Diabetes Father   . Heart disease Father     Allergies  Allergen Reactions  . Erythromycin Base Anxiety  . Xanax [Alprazolam] Other (See Comments)    Tachycardia  . Benzodiazepines     PT STATES HE CANNOT TAKE THESE BECAUSE THEY ALMOST KILLED HIM  . Methocarbamol Anxiety and Other (See Comments)    Stomach cramping, weakness  . Sulfa Antibiotics Nausea And Vomiting and Other (See Comments)    Cramping in stomach and hyperventilate.        Assessment & Plan:   1. Ischemia of lower extremity Recommend:  The patient has evidence of severe atherosclerotic changes of both lower extremities with rest pain that is associated with preulcerative changes and impending tissue loss of the foot.  This represents a limb threatening ischemia and places the patient at the risk for limb loss.  I have also had a very frank discussion with the patient including  that if he does not proceed with angiogram the likelihood that he will lose his right lower extremity is extremely high.  Patient should undergo angiography of the lower extremities with the hope for intervention for limb salvage.  The risks and benefits as well as the alternative therapies was discussed in detail with the patient.  All questions were answered.  Patient agrees to proceed with angiography.  The patient will  follow up with me in the office after the procedure.    2. Essential hypertension Continue antihypertensive medications as already ordered, these medications have been reviewed and there are no changes at this time.   3. Hypertriglyceridemia Continue statin as ordered and reviewed, no changes at this time   4. Tobacco use disorder Smoking cessation was discussed, 3-10 minutes spent on this topic specifically   5. Adjustment disorder with mixed anxiety and depressed mood The patient has severe anxiety.  The previous attempted angiogram the patient actually left the table due to not being able to relax.  We will plan on having anesthesia during this procedure as well as all other follow-up if needed.   Current Outpatient Medications on File Prior to Visit  Medication Sig Dispense Refill  . acetaminophen (TYLENOL) 325 MG tablet Take 650 mg by mouth every 6 (six) hours as needed.     Marland Kitchen aspirin EC 81 MG tablet Take 1 tablet (81 mg total) by mouth daily. 30 tablet 3  . atorvastatin (LIPITOR) 40 MG tablet Take 1 tablet (40 mg total) by mouth daily. 30 tablet 3  . blood glucose meter kit and supplies KIT Dispense based on patient and insurance preference. Use up to four times daily as directed. (FOR ICD-9 250.00, 250.01). (Patient taking differently: Inject 1 each into the skin See admin instructions. Dispense based on patient and insurance preference. Use up to four times daily as directed. (FOR ICD-9 250.00, 250.01).) 1 each 0  . buPROPion (WELLBUTRIN) 100 MG tablet Take 1  tablet (100 mg total) by mouth 2 (two) times daily. 60 tablet 1  . busPIRone (BUSPAR) 10 MG tablet Take 1 tablet (10 mg total) by mouth 3 (three) times daily. 90 tablet 1  . glimepiride (AMARYL) 4 MG tablet Take 1.5 tablets (6 mg total) by mouth daily with breakfast. (Patient taking differently: Take 4 mg by mouth in the morning and at bedtime. ) 45 tablet 0  . glucose blood (TRUE METRIX BLOOD GLUCOSE TEST) test strip Use as instructed 100 each 12  . metFORMIN (GLUCOPHAGE) 1000 MG tablet TAKE ONE TABLET BY MOUTH 2 TIMES A DAY WITH A MEAL (Patient taking differently: Take 1,000 mg by mouth 2 (two) times daily with a meal. ) 60 tablet 0  . metoprolol tartrate (LOPRESSOR) 50 MG tablet TAKE ONE TABLET BY MOUTH 2 TIMES A DAY (Patient taking differently: Take 50 mg by mouth 2 (two) times daily. ) 60 tablet 0  . naproxen sodium (ALEVE) 220 MG tablet Take 220 mg by mouth daily.     No current facility-administered medications on file prior to visit.    There are no Patient Instructions on file for this visit. No follow-ups on file.   Kris Hartmann, NP

## 2020-01-23 ENCOUNTER — Other Ambulatory Visit: Payer: Self-pay

## 2020-01-23 ENCOUNTER — Other Ambulatory Visit
Admission: RE | Admit: 2020-01-23 | Discharge: 2020-01-23 | Disposition: A | Discharge status: Home / Self Care | Payer: HRSA Program | Source: Ambulatory Visit | Attending: Vascular Surgery | Admitting: Vascular Surgery

## 2020-01-23 DIAGNOSIS — Z20822 Contact with and (suspected) exposure to covid-19: Secondary | ICD-10-CM | POA: Diagnosis not present

## 2020-01-23 DIAGNOSIS — Z01812 Encounter for preprocedural laboratory examination: Secondary | ICD-10-CM | POA: Insufficient documentation

## 2020-01-23 LAB — SARS CORONAVIRUS 2 (TAT 6-24 HRS): SARS Coronavirus 2: NEGATIVE

## 2020-01-27 ENCOUNTER — Ambulatory Visit
Admission: RE | Admit: 2020-01-27 | Discharge: 2020-01-27 | Disposition: A | Discharge status: Home / Self Care | Payer: Self-pay | Attending: Vascular Surgery | Admitting: Vascular Surgery

## 2020-01-27 ENCOUNTER — Ambulatory Visit: Payer: Self-pay | Admitting: Anesthesiology

## 2020-01-27 ENCOUNTER — Encounter: Payer: Self-pay | Admitting: Vascular Surgery

## 2020-01-27 ENCOUNTER — Other Ambulatory Visit (INDEPENDENT_AMBULATORY_CARE_PROVIDER_SITE_OTHER): Payer: Self-pay | Admitting: Nurse Practitioner

## 2020-01-27 ENCOUNTER — Encounter
Admission: RE | Disposition: A | Discharge status: Home / Self Care | Payer: Self-pay | Source: Home / Self Care | Attending: Vascular Surgery

## 2020-01-27 DIAGNOSIS — Z7982 Long term (current) use of aspirin: Secondary | ICD-10-CM | POA: Insufficient documentation

## 2020-01-27 DIAGNOSIS — E785 Hyperlipidemia, unspecified: Secondary | ICD-10-CM

## 2020-01-27 DIAGNOSIS — E1151 Type 2 diabetes mellitus with diabetic peripheral angiopathy without gangrene: Secondary | ICD-10-CM | POA: Insufficient documentation

## 2020-01-27 DIAGNOSIS — Z885 Allergy status to narcotic agent status: Secondary | ICD-10-CM | POA: Insufficient documentation

## 2020-01-27 DIAGNOSIS — I739 Peripheral vascular disease, unspecified: Secondary | ICD-10-CM

## 2020-01-27 DIAGNOSIS — E1140 Type 2 diabetes mellitus with diabetic neuropathy, unspecified: Secondary | ICD-10-CM | POA: Insufficient documentation

## 2020-01-27 DIAGNOSIS — Z7984 Long term (current) use of oral hypoglycemic drugs: Secondary | ICD-10-CM | POA: Insufficient documentation

## 2020-01-27 DIAGNOSIS — F17210 Nicotine dependence, cigarettes, uncomplicated: Secondary | ICD-10-CM | POA: Insufficient documentation

## 2020-01-27 DIAGNOSIS — Z882 Allergy status to sulfonamides status: Secondary | ICD-10-CM | POA: Insufficient documentation

## 2020-01-27 DIAGNOSIS — Z79899 Other long term (current) drug therapy: Secondary | ICD-10-CM | POA: Insufficient documentation

## 2020-01-27 DIAGNOSIS — F4323 Adjustment disorder with mixed anxiety and depressed mood: Secondary | ICD-10-CM | POA: Insufficient documentation

## 2020-01-27 DIAGNOSIS — I10 Essential (primary) hypertension: Secondary | ICD-10-CM | POA: Insufficient documentation

## 2020-01-27 DIAGNOSIS — I70222 Atherosclerosis of native arteries of extremities with rest pain, left leg: Secondary | ICD-10-CM

## 2020-01-27 DIAGNOSIS — I70229 Atherosclerosis of native arteries of extremities with rest pain, unspecified extremity: Secondary | ICD-10-CM

## 2020-01-27 DIAGNOSIS — I70221 Atherosclerosis of native arteries of extremities with rest pain, right leg: Secondary | ICD-10-CM | POA: Insufficient documentation

## 2020-01-27 DIAGNOSIS — Z86718 Personal history of other venous thrombosis and embolism: Secondary | ICD-10-CM | POA: Insufficient documentation

## 2020-01-27 DIAGNOSIS — E781 Pure hyperglyceridemia: Secondary | ICD-10-CM | POA: Insufficient documentation

## 2020-01-27 HISTORY — PX: LOWER EXTREMITY ANGIOGRAPHY: CATH118251

## 2020-01-27 LAB — CREATININE, SERUM
Creatinine, Ser: 0.7 mg/dL (ref 0.61–1.24)
GFR calc Af Amer: >60 mL/min (ref >60)
GFR calc non Af Amer: >60 mL/min (ref >60)

## 2020-01-27 LAB — GLUCOSE, CAPILLARY: Glucose-Capillary: 182 mg/dL — ABNORMAL HIGH (ref 70–99)

## 2020-01-27 LAB — BUN: BUN: 8 mg/dL (ref 6–20)

## 2020-01-27 SURGERY — LOWER EXTREMITY ANGIOGRAPHY
Anesthesia: General | Laterality: Right

## 2020-01-27 MED ORDER — IODIXANOL 320 MG/ML IV SOLN
INTRAVENOUS | Status: DC | PRN
  Administered 2020-01-27: 50 mL

## 2020-01-27 MED ORDER — NITROGLYCERIN 0.2 MG/ML ON CALL CATH LAB
INTRAVENOUS | Status: DC | PRN
  Administered 2020-01-27: 300 ug via INTRAVENOUS

## 2020-01-27 MED ORDER — SODIUM CHLORIDE 0.9 % IV SOLN: INTRAVENOUS | Status: DC

## 2020-01-27 MED ORDER — HYDRALAZINE HCL 20 MG/ML IJ SOLN: 5.0000 mg | INTRAMUSCULAR | Status: DC | PRN

## 2020-01-27 MED ORDER — ASPIRIN EC 81 MG PO TBEC
81.0000 mg | DELAYED_RELEASE_TABLET | Freq: Every day | ORAL | 3 refills | Status: DC
Start: 2020-01-27 — End: 2020-05-05

## 2020-01-27 MED ORDER — LACTATED RINGERS IV SOLN: INTRAVENOUS | Status: DC | PRN

## 2020-01-27 MED ORDER — ONDANSETRON HCL 4 MG/2ML IJ SOLN: 4.0000 mg | Freq: Four times a day (QID) | INTRAMUSCULAR | Status: DC | PRN

## 2020-01-27 MED ORDER — ATORVASTATIN CALCIUM 40 MG PO TABS: 40.0000 mg | ORAL_TABLET | Freq: Every day | ORAL | 3 refills | Status: DC

## 2020-01-27 MED ORDER — EPHEDRINE SULFATE 50 MG/ML IJ SOLN
INTRAMUSCULAR | Status: DC | PRN
  Administered 2020-01-27: 10 mg via INTRAVENOUS

## 2020-01-27 MED ORDER — MIDAZOLAM HCL 2 MG/2ML IJ SOLN
2.0000 mg | Freq: Once | INTRAMUSCULAR | Status: AC
  Administered 2020-01-27: 2 mg via INTRAVENOUS

## 2020-01-27 MED ORDER — HYDROCODONE-ACETAMINOPHEN 5-325 MG PO TABS
ORAL_TABLET | ORAL | Status: AC
  Administered 2020-01-27: 1 via ORAL
  Filled 2020-01-27: qty 1

## 2020-01-27 MED ORDER — PHENYLEPHRINE HCL (PRESSORS) 10 MG/ML IV SOLN
INTRAVENOUS | Status: DC | PRN
  Administered 2020-01-27 (×2): 100 ug via INTRAVENOUS

## 2020-01-27 MED ORDER — CLOPIDOGREL BISULFATE 75 MG PO TABS: 75.0000 mg | ORAL_TABLET | Freq: Every day | ORAL | 11 refills | Status: AC

## 2020-01-27 MED ORDER — HYDROCODONE-ACETAMINOPHEN 5-325 MG PO TABS: 1.0000 | ORAL_TABLET | Freq: Once | ORAL | Status: AC

## 2020-01-27 MED ORDER — ATORVASTATIN CALCIUM 40 MG PO TABS: 40.0000 mg | ORAL_TABLET | Freq: Every day | ORAL | Status: DC

## 2020-01-27 MED ORDER — FENTANYL CITRATE (PF) 100 MCG/2ML IJ SOLN
INTRAMUSCULAR | Status: AC
  Filled 2020-01-27: qty 2

## 2020-01-27 MED ORDER — FAMOTIDINE 20 MG PO TABS: 40.0000 mg | ORAL_TABLET | Freq: Once | ORAL | Status: DC | PRN

## 2020-01-27 MED ORDER — FENTANYL CITRATE (PF) 100 MCG/2ML IJ SOLN
INTRAMUSCULAR | Status: DC | PRN
  Administered 2020-01-27 (×2): 25 ug via INTRAVENOUS
  Administered 2020-01-27: 50 ug via INTRAVENOUS

## 2020-01-27 MED ORDER — CEFAZOLIN SODIUM-DEXTROSE 2-4 GM/100ML-% IV SOLN
2.0000 g | Freq: Once | INTRAVENOUS | Status: AC
  Administered 2020-01-27: 2 g via INTRAVENOUS

## 2020-01-27 MED ORDER — ASPIRIN EC 81 MG PO TBEC: 81.0000 mg | DELAYED_RELEASE_TABLET | Freq: Every day | ORAL | Status: DC

## 2020-01-27 MED ORDER — PROPOFOL 10 MG/ML IV BOLUS
INTRAVENOUS | Status: DC | PRN
  Administered 2020-01-27: 140 mg via INTRAVENOUS

## 2020-01-27 MED ORDER — SODIUM CHLORIDE 0.9 % IV SOLN: 250.0000 mL | INTRAVENOUS | Status: DC | PRN

## 2020-01-27 MED ORDER — IPRATROPIUM-ALBUTEROL 0.5-2.5 (3) MG/3ML IN SOLN
3.0000 mL | Freq: Once | RESPIRATORY_TRACT | Status: AC
  Administered 2020-01-27: 3 mL via RESPIRATORY_TRACT

## 2020-01-27 MED ORDER — MIDAZOLAM HCL 2 MG/2ML IJ SOLN
INTRAMUSCULAR | Status: AC
  Filled 2020-01-27: qty 6

## 2020-01-27 MED ORDER — MIDAZOLAM HCL 2 MG/2ML IJ SOLN
INTRAMUSCULAR | Status: DC | PRN
  Administered 2020-01-27 (×2): 2 mg via INTRAVENOUS

## 2020-01-27 MED ORDER — NITROGLYCERIN 1 MG/10 ML FOR IR/CATH LAB
INTRA_ARTERIAL | Status: AC
  Filled 2020-01-27: qty 10

## 2020-01-27 MED ORDER — IPRATROPIUM-ALBUTEROL 0.5-2.5 (3) MG/3ML IN SOLN
RESPIRATORY_TRACT | Status: AC
  Filled 2020-01-27: qty 3

## 2020-01-27 MED ORDER — ONDANSETRON HCL 4 MG/2ML IJ SOLN
INTRAMUSCULAR | Status: DC | PRN
  Administered 2020-01-27: 4 mg via INTRAVENOUS

## 2020-01-27 MED ORDER — LABETALOL HCL 5 MG/ML IV SOLN: 10.0000 mg | INTRAVENOUS | Status: DC | PRN

## 2020-01-27 MED ORDER — SODIUM CHLORIDE 0.9% FLUSH: 3.0000 mL | INTRAVENOUS | Status: DC | PRN

## 2020-01-27 MED ORDER — HYDROMORPHONE HCL 1 MG/ML IJ SOLN: 1.0000 mg | Freq: Once | INTRAMUSCULAR | Status: DC | PRN

## 2020-01-27 MED ORDER — CEFAZOLIN SODIUM-DEXTROSE 2-4 GM/100ML-% IV SOLN
INTRAVENOUS | Status: AC
  Filled 2020-01-27: qty 100

## 2020-01-27 MED ORDER — PROPOFOL 10 MG/ML IV BOLUS
INTRAVENOUS | Status: AC
  Filled 2020-01-27: qty 20

## 2020-01-27 MED ORDER — ACETAMINOPHEN 325 MG PO TABS: 650.0000 mg | ORAL_TABLET | ORAL | Status: DC | PRN

## 2020-01-27 MED ORDER — CLOPIDOGREL BISULFATE 75 MG PO TABS: 75.0000 mg | ORAL_TABLET | Freq: Every day | ORAL | Status: DC

## 2020-01-27 MED ORDER — DIPHENHYDRAMINE HCL 50 MG/ML IJ SOLN: 50.0000 mg | Freq: Once | INTRAMUSCULAR | Status: DC | PRN

## 2020-01-27 MED ORDER — HEPARIN SODIUM (PORCINE) 1000 UNIT/ML IJ SOLN
INTRAMUSCULAR | Status: DC | PRN
  Administered 2020-01-27: 5000 [IU] via INTRAVENOUS

## 2020-01-27 MED ORDER — SODIUM CHLORIDE 0.9% FLUSH: 3.0000 mL | Freq: Two times a day (BID) | INTRAVENOUS | Status: DC

## 2020-01-27 MED ORDER — DEXAMETHASONE SODIUM PHOSPHATE 10 MG/ML IJ SOLN
INTRAMUSCULAR | Status: DC | PRN
  Administered 2020-01-27: 10 mg via INTRAVENOUS

## 2020-01-27 MED ORDER — MIDAZOLAM HCL 2 MG/2ML IJ SOLN
INTRAMUSCULAR | Status: AC
  Filled 2020-01-27: qty 2

## 2020-01-27 MED ORDER — MIDAZOLAM HCL 2 MG/ML PO SYRP: 8.0000 mg | ORAL_SOLUTION | Freq: Once | ORAL | Status: DC | PRN

## 2020-01-27 SURGICAL SUPPLY — 24 items
BALLN LUTONIX 018 5X100X130 (BALLOONS) ×2
BALLN LUTONIX 018 5X300X130 (BALLOONS) ×2
BALLN ULTRVRSE 3X300X150 (BALLOONS) ×2
BALLN ULTRVRSE 3X300X150 OTW (BALLOONS) ×1
BALLOON LUTONIX 018 5X100X130 (BALLOONS) IMPLANT
BALLOON LUTONIX 018 5X300X130 (BALLOONS) IMPLANT
BALLOON ULTRVRSE 3X300X150 OTW (BALLOONS) IMPLANT
CATH ANGIO 5F PIGTAIL 65CM (CATHETERS) ×1 IMPLANT
CATH BEACON 5 .038 100 VERT TP (CATHETERS) ×1 IMPLANT
CATH CXI SUPP ANG 4FR 135 (CATHETERS) IMPLANT
CATH CXI SUPP ANG 4FR 135CM (CATHETERS) ×2
CATH ROTAREX 135 6FR (CATHETERS) ×1 IMPLANT
DEVICE PRESTO INFLATION (MISCELLANEOUS) ×1 IMPLANT
DEVICE STARCLOSE SE CLOSURE (Vascular Products) ×1 IMPLANT
GLIDEWIRE ADV .035X260CM (WIRE) ×1 IMPLANT
PACK ANGIOGRAPHY (CUSTOM PROCEDURE TRAY) ×2 IMPLANT
SHEATH ANL2 6FRX45 HC (SHEATH) ×1 IMPLANT
SHEATH BRITE TIP 5FRX11 (SHEATH) ×1 IMPLANT
STENT VIABAHN 6X100X120 (Permanent Stent) ×1 IMPLANT
STENT VIABAHN 6X250X120 (Permanent Stent) ×1 IMPLANT
SYR MEDRAD MARK 7 150ML (SYRINGE) ×1 IMPLANT
TUBING CONTRAST HIGH PRESS 72 (TUBING) ×1 IMPLANT
WIRE G V18X300CM (WIRE) ×1 IMPLANT
WIRE J 3MM .035X145CM (WIRE) ×1 IMPLANT

## 2020-01-27 NOTE — Transfer of Care (Signed)
Immediate Anesthesia Transfer of Care Note  Patient: [name]  Procedure(s) Performed: LOWER EXTREMITY ANGIOGRAPHY (Right )  Patient Location: PACU  Anesthesia Type:General  Level of Consciousness: sedated  Airway & Oxygen Therapy: Patient Spontanous Breathing and Patient connected to face mask oxygen  Post-op Assessment: Report given to RN and Post -op Vital signs reviewed and stable  Post vital signs: Reviewed and stable  Last Vitals:  Vitals Value Taken Time  BP 142/83 01/27/20 1515  Temp    Pulse 67 01/27/20 1515  Resp 17 01/27/20 1515  SpO2 100 % 01/27/20 1515  Vitals shown include unvalidated device data.  Last Pain:  Vitals:   01/27/20 1112  TempSrc: Oral  PainSc: 10-Worst pain ever      Patients Stated Pain Goal: 10 (01/27/20 1112)  Complications: No complications documented.

## 2020-01-27 NOTE — Op Note (Signed)
[facility]  Percutaneous Study/Intervention Procedural Note   Date of Surgery: 01/27/2020  Surgeon(s):Emre Stock    Assistants:none  Pre-operative Diagnosis: PAD with rest pain right lower extremity  Post-operative diagnosis:  Same  Procedure(s) Performed:             1.  Ultrasound guidance for vascular access left femoral artery             2.  Catheter placement into right common femoral artery from left femoral approach             3.  Aortogram and selective right lower extremity angiogram             4.  Percutaneous transluminal angioplasty of right posterior tibial artery and tibioperoneal trunk with 3 mm diameter angioplasty balloon             5.   Percutaneous transluminal angioplasty of the right popliteal artery and distal SFA with 5 mm diameter by 30 cm length Lutonix drug-coated angioplasty balloon as well as percutaneous transluminal angioplasty of the proximal right SFA with 5 mm diameter by 10 cm length Lutonix drug-coated angioplasty balloon  6.  Mechanical thrombectomy of the right SFA and popliteal arteries with the roto-Rex device  7.  Viabahn stent placement x2 to the right SFA with 6 mm diameter by 10 cm length stent and to the distal SFA and popliteal artery with 6 mm diameter by 25 cm length stent             8.  StarClose closure device left femoral artery  EBL: 150 cc  Contrast: 60 cc  Fluoro Time: 16 minutes  Anesthesia: General              Indications:  Patient is a 59 y.o.male with rest pain of the right foot. The patient has noninvasive study showing an ABI that was markedly reduced and 0.2 range. The patient is brought in for angiography for further evaluation and potential treatment.  Due to the limb threatening nature of the situation, angiogram was performed for attempted limb salvage. The patient is aware that if the procedure fails, amputation would be expected.  The patient also understands that even with successful  revascularization, amputation may still be required due to the severity of the situation.  Risks and benefits are discussed and informed consent is obtained.   Procedure:  The patient was identified and appropriate procedural time out was performed.  The patient was then placed supine on the table and prepped and draped in the usual sterile fashion. Moderate conscious sedation was administered during a face to face encounter with the patient throughout the procedure with my supervision of the RN administering medicines and monitoring the patient's vital signs, pulse oximetry, telemetry and mental status throughout from the start of the procedure until the patient was taken to the recovery room. Ultrasound was used to evaluate the left common femoral artery.  It was patent .  A digital ultrasound image was acquired.  A Seldinger needle was used to access the left common femoral artery under direct ultrasound guidance and a permanent image was performed.  A 0.035 J wire was advanced without resistance and a 5Fr sheath was placed.  Pigtail catheter was placed into the aorta and an AP aortogram was performed. Both renal arteries appeared to have significant stenosis greater than 60%.  The aorta and iliac arteries are widely patent without significant stenosis.  I then crossed the aortic bifurcation and  advanced to the right femoral head. Selective right lower extremity angiogram was then performed. This demonstrated a high-grade near occlusive stenosis of greater than 90% at the origin of the SFA associated with some chronic appearing thrombus.  The vessel then normalized throughout the mid and distal SFA until it occluded the distal SFA and above-knee popliteal artery with reconstitution of a diseased mid popliteal artery.  There is then again occlusion of the distal popliteal artery and the proximal portions of all 3 tibial vessels.  The peroneal artery reconstituted in the mid segment.  The posterior tibial artery  reconstituted just above the ankle.  The anterior tibial artery did not have significant reconstitution distally. It was felt that it was in the patient's best interest to proceed with intervention after these images to avoid a second procedure and a larger amount of contrast and fluoroscopy based off of the findings from the initial angiogram. The patient was systemically heparinized and a 6 [place] Ansell sheath was then placed over the [employer] wire. I then used a Kumpe catheter and the advantage wire to easily navigate through the SFA and popliteal lesions and down into the tibial vessels.  And then exchanged for the CXI catheter and a V 18 wire and easily crossed the occlusion firming intraluminal flow in the posterior tibial artery at the ankle.  I then replaced the V 18 wire.  A 3 mm diameter by 30 cm length angioplasty balloon was inflated twice from the ankle up to the tibioperoneal trunk.  The distal inflation was 6 atm for 1 minute and the proximal inflation was 10 atm for 1 minute.  I then did a 5 mm diameter by 30 cm length angioplasty balloon for the right SFA and popliteal arteries and inflated this to 8 atm for 1 minute.  The proximal SFA was treated with a 5 mm diameter by 10 cm likely tonics drug-coated angioplasty balloon inflated to 10 atm for 1 minute.  Completion imaging showed greater than 50% residual stenosis and thrombus both in the proximal SFA and in the distal SFA and above-knee popliteal artery.  For this chronic thrombus, I elected for mechanical thrombectomy with the roto-Rex device.  This was brought on the field and 2 passes were made in the distal SFA and above-knee popliteal artery and 3 passes were made in the proximal SFA.  This had a marked reduction in the amount of thrombus but there remained greater than 70% residual stenosis in multiple locations in the distal SFA and popliteal artery as well as at the origin of the SFA.  I elected to cover these areas with Viabahn  stents particularly given the thrombus that have been treated.  For the distal lesion, a 6 mm diameter by 25 cm length Viabahn stent was deployed from the distal SFA down to the below-knee popliteal artery and postdilated with a 5 mm balloon.  For the origin of the SFA, 6 mm diameter by 10 cm length Viabahn stent was deployed with its leading edge at the origin of the SFA taking care not to harm the profunda femoris artery.  This was postdilated with a 5 mm balloon.  Completion imaging showed less than 10% residual stenosis in both stent placements.  There was some spasm in the posterior tibial artery, but the areas treated appeared to have no greater than 30% residual stenosis. I elected to terminate the procedure. The sheath was removed and StarClose closure device was deployed in the left femoral artery with excellent  hemostatic result. The patient was taken to the recovery room in stable condition having tolerated the procedure well.  Findings:               Aortogram:  Both renal arteries appeared to have significant stenosis greater than 60%.  The aorta and iliac arteries are widely patent without significant stenosis.             Right lower Extremity:  This demonstrated a high-grade near occlusive stenosis of greater than 90% at the origin of the SFA associated with some chronic appearing thrombus.  The vessel then normalized throughout the mid and distal SFA until it occluded the distal SFA and above-knee popliteal artery with reconstitution of a diseased mid popliteal artery.  There is then again occlusion of the distal popliteal artery and the proximal portions of all 3 tibial vessels.  The peroneal artery reconstituted in the mid segment.  The posterior tibial artery reconstituted just above the ankle.  The anterior tibial artery did not have significant reconstitution distally   Disposition: Patient was taken to the recovery room in stable condition having tolerated the procedure  well.  Complications: None  Leotis Pain 01/27/2020 3:00 PM   This note was created with Dragon Medical transcription system. Any errors in dictation are purely unintentional.

## 2020-01-27 NOTE — Interval H&P Note (Signed)
History and Physical Interval Note:  01/27/2020 10:00 AM  [name]  has presented today for surgery, with the diagnosis of RT Lower Extremity Angiography   ASO with rest pain   ANESTHESIA   BARD Rep   cc: M Godley, S Willey  Pt to have Covid test on 01-23-20.  The various methods of treatment have been discussed with the patient and family. After consideration of risks, benefits and other options for treatment, the patient has consented to  Procedure(s): LOWER EXTREMITY ANGIOGRAPHY (Right) as a surgical intervention.  The patient's history has been reviewed, patient examined, no change in status, stable for surgery.  I have reviewed the patient's chart and labs.  Questions were answered to the patient's satisfaction.     [name]

## 2020-01-27 NOTE — Anesthesia Procedure Notes (Signed)
Procedure Name: LMA Insertion Date/Time: 01/27/2020 2:00 PM Performed by: Junious Silk, CRNA Pre-anesthesia Checklist: Patient identified, Patient being monitored, Timeout performed, Emergency Drugs available and Suction available Patient Re-evaluated:Patient Re-evaluated prior to induction Oxygen Delivery Method: Circle system utilized Preoxygenation: Pre-oxygenation with 100% oxygen Induction Type: IV induction Ventilation: Mask ventilation without difficulty LMA: LMA inserted LMA Size: 4.0 Tube type: Oral Number of attempts: 1 Placement Confirmation: positive ETCO2 and breath sounds checked- equal and bilateral Tube secured with: Tape Dental Injury: Teeth and Oropharynx as per pre-operative assessment

## 2020-01-27 NOTE — H&P (Signed)
[facility] History & Physical Update  The patient was interviewed and re-examined.  The patient's previous History and Physical has been reviewed and is unchanged.  There is no change in the plan of care. We plan to proceed with the scheduled procedure.  [name], MD  01/27/2020, 10:01 AM

## 2020-01-27 NOTE — H&P (Signed)
[facility] History & Physical Update  The patient was interviewed and re-examined.  The patient's previous History and Physical has been reviewed and is unchanged.  There is no change in the plan of care. We plan to proceed with the scheduled procedure.  [name], MD  01/27/2020, 12:07 PM

## 2020-01-27 NOTE — Anesthesia Preprocedure Evaluation (Addendum)
Anesthesia Evaluation  Patient identified by MRN, date of birth, ID band Patient awake    Reviewed: Allergy & Precautions, H&P , NPO status , Patient's Chart, lab work & pertinent test results  Airway Mallampati: II  TM Distance: >3 FB     Dental  (+) Chipped   Pulmonary COPD (reports chronic bronchitis, does not have inhalers, trying to get them), Current Smoker,    breath sounds clear to auscultation       Cardiovascular hypertension, (-) angina+ Peripheral Vascular Disease  (-) Past MI and (-) Cardiac Stents (-) dysrhythmias  Rhythm:regular Rate:Normal     Neuro/Psych PSYCHIATRIC DISORDERS Anxiety Depression negative neurological ROS     GI/Hepatic negative GI ROS, Neg liver ROS,   Endo/Other  diabetes  Renal/GU negative Renal ROS  negative genitourinary   Musculoskeletal   Abdominal   Peds  Hematology negative hematology ROS (+)   Anesthesia Other Findings Past Medical History: No date: Arthritis No date: Blood clot in vein No date: Diabetes mellitus No date: DVT (deep venous thrombosis) (HCC) No date: Hypertension No date: Neuropathy  Past Surgical History: No date: APPENDECTOMY No date: VEIN LIGATION AND STRIPPING     Comment:  of left leg     Reproductive/Obstetrics negative OB ROS                            Anesthesia Physical Anesthesia Plan  ASA: III  Anesthesia Plan: General LMA   Post-op Pain Management:    Induction:   PONV Risk Score and Plan: Dexamethasone and Ondansetron  Airway Management Planned:   Additional Equipment:   Intra-op Plan:   Post-operative Plan:   Informed Consent: I have reviewed the patients History and Physical, chart, labs and discussed the procedure including the risks, benefits and alternatives for the proposed anesthesia with the patient or authorized representative who has indicated his/her understanding and acceptance.      Dental Advisory Given  Plan Discussed with: Anesthesiologist, CRNA and Surgeon  Anesthesia Plan Comments:         Anesthesia Quick Evaluation

## 2020-01-27 NOTE — Interval H&P Note (Signed)
History and Physical Interval Note:  01/27/2020 12:07 PM  [name]  has presented today for surgery, with the diagnosis of RT Lower Extremity Angiography   ASO with rest pain   ANESTHESIA   BARD Rep   cc: M Godley, S Willey  Pt to have Covid test on 01-23-20.  The various methods of treatment have been discussed with the patient and family. After consideration of risks, benefits and other options for treatment, the patient has consented to  Procedure(s): LOWER EXTREMITY ANGIOGRAPHY (Right) as a surgical intervention.  The patient's history has been reviewed, patient examined, no change in status, stable for surgery.  I have reviewed the patient's chart and labs.  Questions were answered to the patient's satisfaction.     [name]

## 2020-01-28 ENCOUNTER — Encounter: Payer: Self-pay | Admitting: Vascular Surgery

## 2020-01-28 ENCOUNTER — Other Ambulatory Visit: Payer: Self-pay

## 2020-01-28 DIAGNOSIS — F17200 Nicotine dependence, unspecified, uncomplicated: Secondary | ICD-10-CM

## 2020-01-28 DIAGNOSIS — E1149 Type 2 diabetes mellitus with other diabetic neurological complication: Secondary | ICD-10-CM

## 2020-01-28 DIAGNOSIS — F4323 Adjustment disorder with mixed anxiety and depressed mood: Secondary | ICD-10-CM

## 2020-01-28 DIAGNOSIS — I10 Essential (primary) hypertension: Secondary | ICD-10-CM

## 2020-01-28 MED ORDER — GLIMEPIRIDE 4 MG PO TABS: 4.0000 mg | ORAL_TABLET | Freq: Two times a day (BID) | ORAL | 0 refills | Status: DC

## 2020-01-28 MED ORDER — BUSPIRONE HCL 10 MG PO TABS: 10.0000 mg | ORAL_TABLET | Freq: Three times a day (TID) | ORAL | 0 refills | Status: AC

## 2020-01-28 MED ORDER — METFORMIN HCL 1000 MG PO TABS
1000.0000 mg | ORAL_TABLET | Freq: Two times a day (BID) | ORAL | 0 refills | Status: DC
Start: 2020-01-28 — End: 2020-02-18

## 2020-01-28 MED ORDER — METOPROLOL TARTRATE 50 MG PO TABS: 50.0000 mg | ORAL_TABLET | Freq: Two times a day (BID) | ORAL | 0 refills | Status: DC

## 2020-01-28 MED ORDER — BUPROPION HCL 100 MG PO TABS: 100.0000 mg | ORAL_TABLET | Freq: Two times a day (BID) | ORAL | 1 refills | Status: DC

## 2020-01-29 NOTE — Anesthesia Postprocedure Evaluation (Signed)
Anesthesia Post Note  Patient: [name]  Procedure(s) Performed: LOWER EXTREMITY ANGIOGRAPHY (Right )  Patient location during evaluation: PACU Anesthesia Type: General Level of consciousness: awake and alert and oriented Pain management: pain level controlled Vital Signs Assessment: post-procedure vital signs reviewed and stable Respiratory status: spontaneous breathing Cardiovascular status: blood pressure returned to baseline Anesthetic complications: no   No complications documented.   Last Vitals:  Vitals:   01/27/20 1605 01/27/20 1630  BP: (!) 137/77 (!) 119/105  Pulse: 65 83  Resp: 15 12  Temp:    SpO2: 100% 99%    Last Pain:  Vitals:   01/27/20 1630  TempSrc:   PainSc: 0-No pain                 Kamarian Sahakian

## 2020-01-30 ENCOUNTER — Telehealth (INDEPENDENT_AMBULATORY_CARE_PROVIDER_SITE_OTHER): Payer: Self-pay | Admitting: Vascular Surgery

## 2020-01-30 ENCOUNTER — Other Ambulatory Visit (INDEPENDENT_AMBULATORY_CARE_PROVIDER_SITE_OTHER): Payer: Self-pay | Admitting: Nurse Practitioner

## 2020-01-30 MED ORDER — HYDROCODONE-ACETAMINOPHEN 5-325 MG PO TABS: 1.0000 | ORAL_TABLET | Freq: Four times a day (QID) | ORAL | 0 refills | Status: DC | PRN

## 2020-01-30 NOTE — Telephone Encounter (Signed)
RX sent to [address], however if the pain continues or worsens despite pain medication, he should go to the ED or we can see him sooner.

## 2020-01-30 NOTE — Telephone Encounter (Signed)
Patient called stating that he is in excruciating pain. He says the pain has a burning sensation and sometimes a needle jerking pain. He had  RLE Angio (JD) done 01-27-20. He says that he is taking naproxen and asprin and it just isnt helping with the pain. He would like for a RX to be sent to [facility]) or [facility] ([address]). Patient was last seen 01-21-20 with ABI studies (JD). Please advise.

## 2020-01-30 NOTE — Telephone Encounter (Signed)
See not below. 

## 2020-01-30 NOTE — Telephone Encounter (Signed)
Spoke with the patient and gave him the recommendations from Fallon Brown NP. See notes below. 

## 2020-02-18 ENCOUNTER — Encounter: Payer: Self-pay | Admitting: Gerontology

## 2020-02-18 ENCOUNTER — Other Ambulatory Visit: Payer: Self-pay

## 2020-02-18 ENCOUNTER — Ambulatory Visit: Payer: Self-pay | Admitting: Gerontology

## 2020-02-18 VITALS — BP 138/86 | HR 70 | Temp 96.8°F | Wt 197.0 lb

## 2020-02-18 DIAGNOSIS — E1140 Type 2 diabetes mellitus with diabetic neuropathy, unspecified: Secondary | ICD-10-CM | POA: Insufficient documentation

## 2020-02-18 DIAGNOSIS — I10 Essential (primary) hypertension: Secondary | ICD-10-CM

## 2020-02-18 DIAGNOSIS — I739 Peripheral vascular disease, unspecified: Secondary | ICD-10-CM

## 2020-02-18 DIAGNOSIS — E1149 Type 2 diabetes mellitus with other diabetic neurological complication: Secondary | ICD-10-CM

## 2020-02-18 HISTORY — DX: Type 2 diabetes mellitus with diabetic neuropathy, unspecified: E11.40

## 2020-02-18 MED ORDER — METFORMIN HCL 1000 MG PO TABS: 1000.0000 mg | ORAL_TABLET | Freq: Two times a day (BID) | ORAL | 1 refills | Status: DC

## 2020-02-18 MED ORDER — GABAPENTIN 100 MG PO CAPS: 100.0000 mg | ORAL_CAPSULE | Freq: Every day | ORAL | 0 refills | Status: DC

## 2020-02-18 MED ORDER — GLIMEPIRIDE 4 MG PO TABS: 4.0000 mg | ORAL_TABLET | Freq: Two times a day (BID) | ORAL | 1 refills | Status: DC

## 2020-02-18 MED ORDER — METOPROLOL TARTRATE 50 MG PO TABS: 50.0000 mg | ORAL_TABLET | Freq: Two times a day (BID) | ORAL | 1 refills | Status: DC

## 2020-02-18 NOTE — Progress Notes (Signed)
Established Patient Office Visit  Subjective:  Patient ID: [name], male    DOB: 06-06-1961  Age: 59 y.o. MRN: [id number]  CC: No chief complaint on file.   HPI [name] presents for follow up of type 2 diabetes mellitus and medication refill. His last hemoglobin A1c done on 01/21/2020 was 10.6%. He states that he's compliant with his medications, but has not been checking his blood glucose. His blood glucose was checked during visit and it was 285 mg per DL. He denies hypoglycemia, and endorses hyperglycemic symptoms and peripheral neuropathy. He had angiographic procedure done to his right lower extremity on 01/27/2020 by Dr. Algernon Huxley. Currently, he denies any claudication, erythema and edema. He continues to smoke 1 pack of cigarettes daily and admits to desire to quit. Overall, he states that he is doing well and offers no further complaint.  Past Medical History:  Diagnosis Date   Arthritis    Blood clot in vein    Diabetes mellitus    DVT (deep venous thrombosis) ([place])    Hypertension    Neuropathy     Past Surgical History:  Procedure Laterality Date   APPENDECTOMY     LOWER EXTREMITY ANGIOGRAPHY Right 01/27/2020   Procedure: LOWER EXTREMITY ANGIOGRAPHY;  Surgeon: Algernon Huxley, MD;  Location: [facility];  Service: Cardiovascular;  Laterality: Right;   VEIN LIGATION AND STRIPPING     of left leg    Family History  Problem Relation Age of Onset   Heart disease Mother    Diabetes Father    Heart disease Father     Social History   Socioeconomic History   Marital status: Divorced    Spouse name: Not on file   Number of children: 0   Years of education: Not on file   Highest education level: Not on file  Occupational History   Not on file  Tobacco Use   Smoking status: Current Every Day Smoker    Packs/day: 2.00    Years: 6.00    Pack years: 12.00    Types: Cigarettes   Smokeless tobacco: Never Used   Tobacco comment:  1.5-2  Vaping Use   Vaping Use: Never used  Substance and Sexual Activity   Alcohol use: No   Drug use: No   Sexual activity: Not on file  Other Topics Concern   Not on file  Social History Narrative   Social determinants screening completed 09/19/2019. HS      Patient was given resource for bus passes to link transit and non file tax for information for the stimulus check. [place] [facility] referral made on 5/25 for future lab and follow up appointments at the clinic by scheduling ride coordination through [employer]. HS   Social Determinants of Health   Financial Resource Strain: High Risk   Difficulty of Paying Living Expenses: Hard  Food Insecurity: No Food Insecurity   Worried About Charity fundraiser in the Last Year: Never true   Ran Out of Food in the Last Year: Never true  Transportation Needs: Unmet Transportation Needs   Lack of Transportation (Medical): Yes   Lack of Transportation (Non-Medical): Yes  Physical Activity: Insufficiently Active   Days of Exercise per Week: 3 days   Minutes of Exercise per Session: 30 min  Stress: Stress Concern Present   Feeling of Stress : To some extent  Social Connections: Socially Isolated   Frequency of Communication with Friends and Family:  Never   Frequency of Social Gatherings with Friends and Family: Never   Attends Religious Services: Never   Marine scientist or Organizations: No   Attends Music therapist: Never   Marital Status: Divorced  Human resources officer Violence: Not At Risk   Fear of Current or Ex-Partner: No   Emotionally Abused: No   Physically Abused: No   Sexually Abused: No    Outpatient Medications Prior to Visit  Medication Sig Dispense Refill   aspirin EC 81 MG tablet Take 1 tablet (81 mg total) by mouth daily. 30 tablet 3   acetaminophen (TYLENOL) 325 MG tablet Take 650 mg by mouth every 6 (six) hours as needed.      atorvastatin (LIPITOR) 40 MG tablet Take 1 tablet  (40 mg total) by mouth daily. 30 tablet 3   blood glucose meter kit and supplies KIT Dispense based on patient and insurance preference. Use up to four times daily as directed. (FOR ICD-9 250.00, 250.01). (Patient taking differently: Inject 1 each into the skin See admin instructions. Dispense based on patient and insurance preference. Use up to four times daily as directed. (FOR ICD-9 250.00, 250.01).) 1 each 0   buPROPion (WELLBUTRIN) 100 MG tablet Take 1 tablet (100 mg total) by mouth 2 (two) times daily. 60 tablet 1   busPIRone (BUSPAR) 10 MG tablet Take 1 tablet (10 mg total) by mouth 3 (three) times daily. 90 tablet 0   clopidogrel (PLAVIX) 75 MG tablet Take 1 tablet (75 mg total) by mouth daily. 30 tablet 11   glucose blood (TRUE METRIX BLOOD GLUCOSE TEST) test strip Use as instructed 100 each 12   HYDROcodone-acetaminophen (NORCO/VICODIN) 5-325 MG tablet Take 1 tablet by mouth every 6 (six) hours as needed for moderate pain. 30 tablet 0   naproxen sodium (ALEVE) 220 MG tablet Take 220 mg by mouth daily.     glimepiride (AMARYL) 4 MG tablet Take 1 tablet (4 mg total) by mouth in the morning and at bedtime. 60 tablet 0   metFORMIN (GLUCOPHAGE) 1000 MG tablet Take 1 tablet (1,000 mg total) by mouth 2 (two) times daily with a meal. 60 tablet 0   metoprolol tartrate (LOPRESSOR) 50 MG tablet Take 1 tablet (50 mg total) by mouth 2 (two) times daily. 60 tablet 0   No facility-administered medications prior to visit.    Allergies  Allergen Reactions   Erythromycin Base Anxiety   Xanax [Alprazolam] Other (See Comments)    Tachycardia   Benzodiazepines     PT STATES HE CANNOT TAKE THESE BECAUSE THEY ALMOST KILLED HIM   Methocarbamol Anxiety and Other (See Comments)    Stomach cramping, weakness   Sulfa Antibiotics Nausea And Vomiting and Other (See Comments)    Cramping in stomach and hyperventilate.     ROS Review of Systems  Constitutional: Negative.   Eyes: Negative.    Respiratory: Negative.   Cardiovascular: Negative.   Endocrine: Positive for polydipsia and polyuria.  Neurological: Positive for numbness (peripheral neuropathy).  Hematological: Negative.       Objective:    Physical Exam HENT:     Head: Normocephalic and atraumatic.  Cardiovascular:     Rate and Rhythm: Normal rate and regular rhythm.     Pulses: Normal pulses.     Heart sounds: Normal heart sounds.  Pulmonary:     Effort: Pulmonary effort is normal.     Breath sounds: Normal breath sounds.  Musculoskeletal:     Right lower  leg: No edema.     Left lower leg: No edema.  Skin:    General: Skin is warm and dry.  Neurological:     General: No focal deficit present.     Mental Status: He is alert and oriented to person, place, and time. Mental status is at baseline.  Psychiatric:        Mood and Affect: Mood normal.        Behavior: Behavior normal.        Thought Content: Thought content normal.        Judgment: Judgment normal.     BP 138/86 (BP Location: Left Arm, Patient Position: Sitting, Cuff Size: Large)    Pulse 70    Temp (!) 96.8 F (36 C)    Wt 197 lb (89.4 kg)    BMI 27.48 kg/m  Wt Readings from Last 3 Encounters:  02/18/20 197 lb (89.4 kg)  01/27/20 (!) 208 lb (94.3 kg)  01/21/20 201 lb 14.4 oz (91.6 kg)  He lost 11 pounds in 3 weeks, he was encouraged to continue on his weight loss regimen.   Health Maintenance Due  Topic Date Due   Hepatitis C Screening  Never done   COVID-19 Vaccine (1) Never done   TETANUS/TDAP  Never done   COLONOSCOPY  Never done   INFLUENZA VACCINE  02/02/2020    There are no preventive care reminders to display for this patient.  Lab Results  Component Value Date   TSH 2.070 09/18/2019   Lab Results  Component Value Date   WBC 6.7 01/17/2020   HGB 17.0 01/17/2020   HCT 51.2 01/17/2020   MCV 95.2 01/17/2020   PLT 132 (L) 01/17/2020   Lab Results  Component Value Date   NA 140 01/17/2020   K 4.8  01/17/2020   CO2 29 01/17/2020   GLUCOSE 120 (H) 01/17/2020   BUN 8 01/27/2020   CREATININE 0.70 01/27/2020   BILITOT 0.7 01/15/2020   ALKPHOS 64 01/15/2020   AST 15 01/15/2020   ALT 15 01/15/2020   PROT 6.6 01/15/2020   ALBUMIN 4.2 01/15/2020   CALCIUM 9.6 01/17/2020   ANIONGAP 7 01/17/2020   Lab Results  Component Value Date   CHOL 191 09/18/2019   Lab Results  Component Value Date   HDL 37 (L) 09/18/2019   Lab Results  Component Value Date   LDLCALC 127 (H) 09/18/2019   Lab Results  Component Value Date   TRIG 151 (H) 09/18/2019   Lab Results  Component Value Date   CHOLHDL 5.2 (H) 09/18/2019   Lab Results  Component Value Date   HGBA1C 10.6 (A) 01/21/2020      Assessment & Plan:    1. Type 2 diabetes mellitus with other neurologic complication, without long-term current use of insulin (HCC) - His HgbA1c was 10.6% and his goal should be less than 7%. He was strongly encouraged to check blood glucose at least bid, record and bring log to follow up appointment. His fasting reading should be between 80-130 mg/dl. He was encouraged to continue on low carb/non concentrated sweet diet and exercise as tolerated. - glimepiride (AMARYL) 4 MG tablet; Take 1 tablet (4 mg total) by mouth in the morning and at bedtime.  Dispense: 60 tablet; Refill: 1 - metFORMIN (GLUCOPHAGE) 1000 MG tablet; Take 1 tablet (1,000 mg total) by mouth 2 (two) times daily with a meal.  Dispense: 60 tablet; Refill: 1 - He will continue Gabapentin for peripheral neuropathy, educated  on medication side effects and advised to notify clinic.  gabapentin (NEURONTIN) 100 MG capsule; Take 1 capsule (100 mg total) by mouth at bedtime.  Dispense: 30 capsule; Refill: 0  2. Essential hypertension - His blood pressure is under control, he will continue on current treatment regimen and encouraged on smoking cessation. - metoprolol tartrate (LOPRESSOR) 50 MG tablet; Take 1 tablet (50 mg total) by mouth 2 (two)  times daily.  Dispense: 60 tablet; Refill: 1   3. Peripheral vascular disease ([place]) - He will continue on current treatment regimen and was encouraged on smoking cessation. -He was advised to follow up with Vascular Surgery.    Follow-up: Return in about 22 days (around 03/11/2020), or if symptoms worsen or fail to improve.    Denard Tuminello Jerold Coombe, NP

## 2020-02-18 NOTE — Patient Instructions (Signed)
Carbohydrate Counting for Diabetes Mellitus, Adult  Carbohydrate counting is a method of keeping track of how many carbohydrates you eat. Eating carbohydrates naturally increases the amount of sugar (glucose) in the blood. Counting how many carbohydrates you eat helps keep your blood glucose within normal limits, which helps you manage your diabetes (diabetes mellitus). It is important to know how many carbohydrates you can safely have in each meal. This is different for every person. A diet and nutrition specialist (registered dietitian) can help you make a meal plan and calculate how many carbohydrates you should have at each meal and snack. Carbohydrates are found in the following foods:  Grains, such as breads and cereals.  Dried beans and soy products.  Starchy vegetables, such as potatoes, peas, and corn.  Fruit and fruit juices.  Milk and yogurt.  Sweets and snack foods, such as cake, cookies, candy, chips, and soft drinks. How do I count carbohydrates? There are two ways to count carbohydrates in food. You can use either of the methods or a combination of both. Reading "Nutrition Facts" on packaged food The "Nutrition Facts" list is included on the labels of almost all packaged foods and beverages in the [place] It includes:  The serving size.  Information about nutrients in each serving, including the grams (g) of carbohydrate per serving. To use the "Nutrition Facts":  Decide how many servings you will have.  Multiply the number of servings by the number of carbohydrates per serving.  The resulting number is the total amount of carbohydrates that you will be having. Learning standard serving sizes of other foods When you eat carbohydrate foods that are not packaged or do not include "Nutrition Facts" on the label, you need to measure the servings in order to count the amount of carbohydrates:  Measure the foods that you will eat with a food scale or measuring cup, if  needed.  Decide how many standard-size servings you will eat.  Multiply the number of servings by 15. Most carbohydrate-rich foods have about 15 g of carbohydrates per serving. ? For example, if you eat 8 oz (170 g) of strawberries, you will have eaten 2 servings and 30 g of carbohydrates (2 servings x 15 g = 30 g).  For foods that have more than one food mixed, such as soups and casseroles, you must count the carbohydrates in each food that is included. The following list contains standard serving sizes of common carbohydrate-rich foods. Each of these servings has about 15 g of carbohydrates:   hamburger bun or  English muffin.   oz (15 mL) syrup.   oz (14 g) jelly.  1 slice of bread.  1 six-inch tortilla.  3 oz (85 g) cooked rice or pasta.  4 oz (113 g) cooked dried beans.  4 oz (113 g) starchy vegetable, such as peas, corn, or potatoes.  4 oz (113 g) hot cereal.  4 oz (113 g) mashed potatoes or  of a large baked potato.  4 oz (113 g) canned or frozen fruit.  4 oz (120 mL) fruit juice.  4-6 crackers.  6 chicken nuggets.  6 oz (170 g) unsweetened dry cereal.  6 oz (170 g) plain fat-free yogurt or yogurt sweetened with artificial sweeteners.  8 oz (240 mL) milk.  8 oz (170 g) fresh fruit or one small piece of fruit.  24 oz (680 g) popped popcorn. Example of carbohydrate counting Sample meal  3 oz (85 g) chicken breast.  6 oz (170 g)   brown rice.  4 oz (113 g) corn.  8 oz (240 mL) milk.  8 oz (170 g) strawberries with sugar-free whipped topping. Carbohydrate calculation 1. Identify the foods that contain carbohydrates: ? Rice. ? Corn. ? Milk. ? Strawberries. 2. Calculate how many servings you have of each food: ? 2 servings rice. ? 1 serving corn. ? 1 serving milk. ? 1 serving strawberries. 3. Multiply each number of servings by 15 g: ? 2 servings rice x 15 g = 30 g. ? 1 serving corn x 15 g = 15 g. ? 1 serving milk x 15 g = 15 g. ? 1  serving strawberries x 15 g = 15 g. 4. Add together all of the amounts to find the total grams of carbohydrates eaten: ? 30 g + 15 g + 15 g + 15 g = 75 g of carbohydrates total. Summary  Carbohydrate counting is a method of keeping track of how many carbohydrates you eat.  Eating carbohydrates naturally increases the amount of sugar (glucose) in the blood.  Counting how many carbohydrates you eat helps keep your blood glucose within normal limits, which helps you manage your diabetes.  A diet and nutrition specialist (registered dietitian) can help you make a meal plan and calculate how many carbohydrates you should have at each meal and snack. This information is not intended to replace advice given to you by your health care provider. Make sure you discuss any questions you have with your health care provider. Document Revised: 01/12/2017 Document Reviewed: 12/02/2015 Elsevier Patient Education  2020 Elsevier Inc.  

## 2020-02-24 ENCOUNTER — Other Ambulatory Visit (INDEPENDENT_AMBULATORY_CARE_PROVIDER_SITE_OTHER): Payer: Self-pay | Admitting: Vascular Surgery

## 2020-02-24 ENCOUNTER — Ambulatory Visit (INDEPENDENT_AMBULATORY_CARE_PROVIDER_SITE_OTHER): Payer: Self-pay | Admitting: Nurse Practitioner

## 2020-02-24 ENCOUNTER — Encounter (INDEPENDENT_AMBULATORY_CARE_PROVIDER_SITE_OTHER): Payer: Self-pay

## 2020-02-24 DIAGNOSIS — I70221 Atherosclerosis of native arteries of extremities with rest pain, right leg: Secondary | ICD-10-CM

## 2020-02-24 DIAGNOSIS — Z95820 Peripheral vascular angioplasty status with implants and grafts: Secondary | ICD-10-CM

## 2020-02-25 ENCOUNTER — Ambulatory Visit (INDEPENDENT_AMBULATORY_CARE_PROVIDER_SITE_OTHER): Payer: Self-pay

## 2020-02-25 ENCOUNTER — Encounter (INDEPENDENT_AMBULATORY_CARE_PROVIDER_SITE_OTHER): Payer: Self-pay | Admitting: Nurse Practitioner

## 2020-02-25 ENCOUNTER — Ambulatory Visit (INDEPENDENT_AMBULATORY_CARE_PROVIDER_SITE_OTHER): Payer: Self-pay | Admitting: Nurse Practitioner

## 2020-02-25 ENCOUNTER — Other Ambulatory Visit: Payer: Self-pay

## 2020-02-25 VITALS — BP 152/83 | HR 80 | Resp 16 | Wt 200.0 lb

## 2020-02-25 DIAGNOSIS — E1142 Type 2 diabetes mellitus with diabetic polyneuropathy: Secondary | ICD-10-CM

## 2020-02-25 DIAGNOSIS — E781 Pure hyperglyceridemia: Secondary | ICD-10-CM

## 2020-02-25 DIAGNOSIS — I739 Peripheral vascular disease, unspecified: Secondary | ICD-10-CM

## 2020-02-25 DIAGNOSIS — I10 Essential (primary) hypertension: Secondary | ICD-10-CM

## 2020-02-25 DIAGNOSIS — F17200 Nicotine dependence, unspecified, uncomplicated: Secondary | ICD-10-CM

## 2020-02-25 DIAGNOSIS — I70221 Atherosclerosis of native arteries of extremities with rest pain, right leg: Secondary | ICD-10-CM

## 2020-02-25 DIAGNOSIS — Z95820 Peripheral vascular angioplasty status with implants and grafts: Secondary | ICD-10-CM

## 2020-02-26 ENCOUNTER — Encounter (INDEPENDENT_AMBULATORY_CARE_PROVIDER_SITE_OTHER): Payer: Self-pay | Admitting: Nurse Practitioner

## 2020-02-26 NOTE — Progress Notes (Signed)
Subjective:    Patient ID: [name], male    DOB: [birth year], 60 y.o.   MRN: [id number] Chief Complaint  Patient presents with  . Follow-up    [facility] 4wk post le angio    The patient returns to the office for followup and review status post angiogram with intervention. The patient notes improvement in the lower extremity symptoms. No interval shortening of the patient's claudication distance or rest pain symptoms. Previous wounds have now healed.  No new ulcers or wounds have occurred since the last visit.  Patient's only complaint is of numbness and burning in his right lower extremity.  The patient previously had known diabetic peripheral neuropathy  There have been no significant changes to the patient's overall health care.  The patient denies amaurosis fugax or recent TIA symptoms. There are no recent neurological changes noted. The patient denies history of DVT, PE or superficial thrombophlebitis. The patient denies recent episodes of angina or shortness of breath.   ABI's Rt=1.17 and Lt=0.67  (previous ABI's Rt=0.23 and Lt=0.69) Duplex US of the triphasic tibial artery of the right lower extremity with monophasic waveforms of the left lower extremity.   Review of Systems  Neurological: Positive for numbness.  All other systems reviewed and are negative.      Objective:   Physical Exam Vitals reviewed.  HENT:     Head: Normocephalic.  Cardiovascular:     Rate and Rhythm: Normal rate and regular rhythm.     Pulses: Normal pulses.     Heart sounds: Normal heart sounds.  Pulmonary:     Effort: Pulmonary effort is normal.     Breath sounds: Normal breath sounds.  Neurological:     Mental Status: He is alert and oriented to person, place, and time.     Gait: Gait abnormal.  Psychiatric:        Mood and Affect: Mood normal.        Speech: Speech is rapid and pressured.        Behavior: Behavior normal.        Thought Content: Thought content normal.         Judgment: Judgment normal.     BP (!) 152/83 (BP Location: Right Arm)   Pulse 80   Resp 16   Wt 200 lb (90.7 kg)   BMI 27.89 kg/m   Past Medical History:  Diagnosis Date  . Arthritis   . Blood clot in vein   . Diabetes mellitus   . DVT (deep venous thrombosis) ([place])   . Hypertension   . Neuropathy     Social History   Socioeconomic History  . Marital status: Divorced    Spouse name: Not on file  . Number of children: 0  . Years of education: Not on file  . Highest education level: Not on file  Occupational History  . Not on file  Tobacco Use  . Smoking status: Current Every Day Smoker    Packs/day: 2.00    Years: 6.00    Pack years: 12.00    Types: Cigarettes  . Smokeless tobacco: Never Used  . Tobacco comment: 1.5-2  Vaping Use  . Vaping Use: Never used  Substance and Sexual Activity  . Alcohol use: No  . Drug use: No  . Sexual activity: Not on file  Other Topics Concern  . Not on file  Social History Narrative   Social determinants screening completed 09/19/2019. HS      Patient was given  resource for bus passes to link transit and non file tax for information for the stimulus check. [place] [facility] referral made on 5/25 for future lab and follow up appointments at the clinic by scheduling ride coordination through [employer]. HS   Social Determinants of Health   Financial Resource Strain: High Risk  . Difficulty of Paying Living Expenses: Hard  Food Insecurity: No Food Insecurity  . Worried About Charity fundraiser in the Last Year: Never true  . Ran Out of Food in the Last Year: Never true  Transportation Needs: Unmet Transportation Needs  . Lack of Transportation (Medical): Yes  . Lack of Transportation (Non-Medical): Yes  Physical Activity: Insufficiently Active  . Days of Exercise per Week: 3 days  . Minutes of Exercise per Session: 30 min  Stress: Stress Concern Present  . Feeling of Stress : To some extent  Social Connections: Socially Isolated  .  Frequency of Communication with Friends and Family: Never  . Frequency of Social Gatherings with Friends and Family: Never  . Attends Religious Services: Never  . Active Member of Clubs or Organizations: No  . Attends Archivist Meetings: Never  . Marital Status: Divorced  Human resources officer Violence: Not At Risk  . Fear of Current or Ex-Partner: No  . Emotionally Abused: No  . Physically Abused: No  . Sexually Abused: No    Past Surgical History:  Procedure Laterality Date  . APPENDECTOMY    . LOWER EXTREMITY ANGIOGRAPHY Right 01/27/2020   Procedure: LOWER EXTREMITY ANGIOGRAPHY;  Surgeon: Algernon Huxley, MD;  Location: [facility];  Service: Cardiovascular;  Laterality: Right;  . VEIN LIGATION AND STRIPPING     of left leg    Family History  Problem Relation Age of Onset  . Heart disease Mother   . Diabetes Father   . Heart disease Father     Allergies  Allergen Reactions  . Erythromycin Base Anxiety  . Xanax [Alprazolam] Other (See Comments)    Tachycardia  . Benzodiazepines     PT STATES HE CANNOT TAKE THESE BECAUSE THEY ALMOST KILLED HIM  . Methocarbamol Anxiety and Other (See Comments)    Stomach cramping, weakness  . Sulfa Antibiotics Nausea And Vomiting and Other (See Comments)    Cramping in stomach and hyperventilate.        Assessment & Plan:   1. Diabetic polyneuropathy associated with type 2 diabetes mellitus ([place]) Based on the patient's description of discomfort on his right lower extremity it is likely that it is a worsening of his neuropathy due to prolonged ischemia of the right lower extremity.  The patient currently does have gabapentin prescribed however due to his living situation he does not feel comfortable with taking it.  Patient will continue to follow with primary care for management of neuropathy.  2. Peripheral vascular disease ([place]) Recommend:  The patient is status post successful angiogram with intervention.  The patient  reports that the claudication symptoms and leg pain is essentially gone.   The patient denies lifestyle limiting changes at this point in time.  No further invasive studies, angiography or surgery at this time The patient should continue walking and begin a more formal exercise program.  The patient should continue antiplatelet therapy and aggressive treatment of the lipid abnormalities  Smoking cessation was again discussed  The patient should continue wearing graduated compression socks 10-15 mmHg strength to control the mild edema.  Patient should undergo noninvasive studies as ordered. The  patient will follow up with me after the studies.   Patient will follow up in 3 months with noninvasive studies   3. Tobacco use disorder Smoking cessation was discussed, 3-10 minutes spent on this topic specifically   4. Hypertriglyceridemia Continue statin as ordered and reviewed, no changes at this time   5. Essential hypertension Continue antihypertensive medications as already ordered, these medications have been reviewed and there are no changes at this time.    Current Outpatient Medications on File Prior to Visit  Medication Sig Dispense Refill  . acetaminophen (TYLENOL) 325 MG tablet Take 650 mg by mouth every 6 (six) hours as needed.     Marland Kitchen aspirin EC 81 MG tablet Take 1 tablet (81 mg total) by mouth daily. 30 tablet 3  . atorvastatin (LIPITOR) 40 MG tablet Take 1 tablet (40 mg total) by mouth daily. 30 tablet 3  . blood glucose meter kit and supplies KIT Dispense based on patient and insurance preference. Use up to four times daily as directed. (FOR ICD-9 250.00, 250.01). (Patient taking differently: Inject 1 each into the skin See admin instructions. Dispense based on patient and insurance preference. Use up to four times daily as directed. (FOR ICD-9 250.00, 250.01).) 1 each 0  . buPROPion (WELLBUTRIN) 100 MG tablet Take 1 tablet (100 mg total) by mouth 2 (two) times daily. 60  tablet 1  . busPIRone (BUSPAR) 10 MG tablet Take 1 tablet (10 mg total) by mouth 3 (three) times daily. 90 tablet 0  . clopidogrel (PLAVIX) 75 MG tablet Take 1 tablet (75 mg total) by mouth daily. 30 tablet 11  . gabapentin (NEURONTIN) 100 MG capsule Take 1 capsule (100 mg total) by mouth at bedtime. 30 capsule 0  . glimepiride (AMARYL) 4 MG tablet Take 1 tablet (4 mg total) by mouth in the morning and at bedtime. 60 tablet 1  . glucose blood (TRUE METRIX BLOOD GLUCOSE TEST) test strip Use as instructed 100 each 12  . HYDROcodone-acetaminophen (NORCO/VICODIN) 5-325 MG tablet Take 1 tablet by mouth every 6 (six) hours as needed for moderate pain. 30 tablet 0  . metFORMIN (GLUCOPHAGE) 1000 MG tablet Take 1 tablet (1,000 mg total) by mouth 2 (two) times daily with a meal. 60 tablet 1  . metoprolol tartrate (LOPRESSOR) 50 MG tablet Take 1 tablet (50 mg total) by mouth 2 (two) times daily. 60 tablet 1  . naproxen sodium (ALEVE) 220 MG tablet Take 220 mg by mouth daily.     No current facility-administered medications on file prior to visit.    There are no Patient Instructions on file for this visit. No follow-ups on file.   Kris Hartmann, NP

## 2020-02-29 ENCOUNTER — Other Ambulatory Visit: Payer: Self-pay

## 2020-02-29 ENCOUNTER — Encounter: Payer: Self-pay | Admitting: Emergency Medicine

## 2020-02-29 ENCOUNTER — Emergency Department
Admission: EM | Admit: 2020-02-29 | Discharge: 2020-02-29 | Disposition: A | Discharge status: Home / Self Care | Payer: Self-pay | Attending: Emergency Medicine | Admitting: Emergency Medicine

## 2020-02-29 DIAGNOSIS — T783XXA Angioneurotic edema, initial encounter: Secondary | ICD-10-CM | POA: Insufficient documentation

## 2020-02-29 DIAGNOSIS — Z7982 Long term (current) use of aspirin: Secondary | ICD-10-CM | POA: Insufficient documentation

## 2020-02-29 DIAGNOSIS — T7840XA Allergy, unspecified, initial encounter: Secondary | ICD-10-CM

## 2020-02-29 DIAGNOSIS — L509 Urticaria, unspecified: Secondary | ICD-10-CM

## 2020-02-29 DIAGNOSIS — Z79899 Other long term (current) drug therapy: Secondary | ICD-10-CM | POA: Insufficient documentation

## 2020-02-29 DIAGNOSIS — F17210 Nicotine dependence, cigarettes, uncomplicated: Secondary | ICD-10-CM | POA: Insufficient documentation

## 2020-02-29 DIAGNOSIS — I10 Essential (primary) hypertension: Secondary | ICD-10-CM | POA: Insufficient documentation

## 2020-02-29 DIAGNOSIS — E1140 Type 2 diabetes mellitus with diabetic neuropathy, unspecified: Secondary | ICD-10-CM | POA: Insufficient documentation

## 2020-02-29 DIAGNOSIS — Z7984 Long term (current) use of oral hypoglycemic drugs: Secondary | ICD-10-CM | POA: Insufficient documentation

## 2020-02-29 MED ORDER — FAMOTIDINE IN NACL 20-0.9 MG/50ML-% IV SOLN
20.0000 mg | Freq: Once | INTRAVENOUS | Status: AC
  Administered 2020-02-29: 20 mg via INTRAVENOUS
  Filled 2020-02-29: qty 50

## 2020-02-29 MED ORDER — METHYLPREDNISOLONE SODIUM SUCC 125 MG IJ SOLR
125.0000 mg | Freq: Once | INTRAMUSCULAR | Status: AC
  Administered 2020-02-29: 125 mg via INTRAVENOUS
  Filled 2020-02-29: qty 2

## 2020-02-29 MED ORDER — SODIUM CHLORIDE 0.9 % IV BOLUS
500.0000 mL | Freq: Once | INTRAVENOUS | Status: AC
  Administered 2020-02-29: 500 mL via INTRAVENOUS

## 2020-02-29 MED ORDER — PREDNISONE 20 MG PO TABS: ORAL_TABLET | ORAL | 0 refills | Status: DC

## 2020-02-29 MED ORDER — EPINEPHRINE 0.3 MG/0.3ML IJ SOAJ
0.3000 mg | Freq: Once | INTRAMUSCULAR | Status: AC
  Administered 2020-02-29: 0.3 mg via INTRAMUSCULAR
  Filled 2020-02-29: qty 0.3

## 2020-02-29 MED ORDER — FAMOTIDINE 20 MG PO TABS: 20.0000 mg | ORAL_TABLET | Freq: Two times a day (BID) | ORAL | 0 refills | Status: DC

## 2020-02-29 MED ORDER — DIPHENHYDRAMINE HCL 50 MG/ML IJ SOLN
25.0000 mg | Freq: Once | INTRAMUSCULAR | Status: AC
  Administered 2020-02-29: 25 mg via INTRAVENOUS
  Filled 2020-02-29: qty 1

## 2020-02-29 MED ORDER — EPINEPHRINE 0.3 MG/0.3ML IJ SOAJ: 0.3000 mg | Freq: Once | INTRAMUSCULAR | 1 refills | Status: AC

## 2020-02-29 NOTE — Discharge Instructions (Addendum)
1. Take the following medicines for the next 4 days: Prednisone 60mg daily Pepcid 20mg twice daily 2. Take Benadryl as needed for itching. 3. Use Epi-Pen in case of acute, life-threatening allergic reaction. 4. Return to the ER for worsening symptoms, persistent vomiting, difficulty breathing or other concerns.  

## 2020-02-29 NOTE — ED Provider Notes (Signed)
[facility] Emergency Department Provider Note   ____________________________________________   First MD Initiated Contact with Patient 02/29/20 0024     (approximate)  I have reviewed the triage vital signs and the nursing notes.   HISTORY  Chief Complaint Allergic reaction   HPI [name] is a 59 y.o. male brought to the ED via EMS from home with a chief complaint of allergic reaction.  Patient started clopidogrel and atorvastatin 1 month ago.  States approximately 27 hours ago he began with a rash/hives on his upper arms.  He was going to go to urgent care to have it evaluated.  Tonight in addition to the hives he began to have lower lip swelling.  Refused blood pressure and Benadryl per EMS.  Denies new foods or exposures.  He is convinced he is having a reaction to his medicines because he had "blotches" (points to bruising) on his arms before, stopped the clopidogrel and the "blotches" went away.  Denies airway distress, chest pain, shortness of breath, abdominal pain, nausea, vomiting or diarrhea.       Past Medical History:  Diagnosis Date  . Arthritis   . Blood clot in vein   . Diabetes mellitus   . DVT (deep venous thrombosis) ([place])   . Hypertension   . Neuropathy     Patient Active Problem List   Diagnosis Date Noted  . Type 2 diabetes mellitus with diabetic neuropathy, unspecified ([place]) 02/18/2020  . Ischemia of lower extremity 01/16/2020  . Needs assistance with community resources 09/11/2019  . Peripheral vascular disease ([place]) 10/23/2018  . Neuropathic pain of ankle, left 01/25/2017  . Adjustment disorder with mixed anxiety and depressed mood 08/27/2016  . MDD (major depressive disorder) 08/10/2016  . Hypertriglyceridemia 06/30/2016  . Thrombocytopenia, unspecified ([place]) 12/06/2012  . Diabetic neuropathy ([place]) 12/05/2012  . Tobacco use disorder 12/05/2012  . Dyslipidemia 12/05/2012  . Controlled type 2 diabetes mellitus  without complication ([place]) 89/37/3428  . Essential hypertension 05/11/2011    Past Surgical History:  Procedure Laterality Date  . APPENDECTOMY    . LOWER EXTREMITY ANGIOGRAPHY Right 01/27/2020   Procedure: LOWER EXTREMITY ANGIOGRAPHY;  Surgeon: Algernon Huxley, MD;  Location: [place] [facility];  Service: Cardiovascular;  Laterality: Right;  . VEIN LIGATION AND STRIPPING     of left leg    Prior to Admission medications   Medication Sig Start Date End Date Taking? Authorizing Provider  acetaminophen (TYLENOL) 325 MG tablet Take 650 mg by mouth every 6 (six) hours as needed.     [provider]  aspirin EC 81 MG tablet Take 1 tablet (81 mg total) by mouth daily. 01/27/20   Algernon Huxley, MD  atorvastatin (LIPITOR) 40 MG tablet Take 1 tablet (40 mg total) by mouth daily. 01/27/20   Algernon Huxley, MD  blood glucose meter kit and supplies KIT Dispense based on patient and insurance preference. Use up to four times daily as directed. (FOR ICD-9 250.00, 250.01). Patient taking differently: Inject 1 each into the skin See admin instructions. Dispense based on patient and insurance preference. Use up to four times daily as directed. (FOR ICD-9 250.00, 250.01). 08/15/19   Iloabachie, Chioma E, NP  buPROPion (WELLBUTRIN) 100 MG tablet Take 1 tablet (100 mg total) by mouth 2 (two) times daily. 01/28/20   Iloabachie, Chioma E, NP  clopidogrel (PLAVIX) 75 MG tablet Take 1 tablet (75 mg total) by mouth daily. 01/27/20   Algernon Huxley, MD  EPINEPHrine 0.3 mg/0.3 mL IJ SOAJ injection Inject 0.3 mLs (0.3 mg total) into the muscle once for 1 dose. 02/29/20 02/29/20  Paulette Blanch, MD  famotidine (PEPCID) 20 MG tablet Take 1 tablet (20 mg total) by mouth 2 (two) times daily. 02/29/20   Paulette Blanch, MD  gabapentin (NEURONTIN) 100 MG capsule Take 1 capsule (100 mg total) by mouth at bedtime. 02/18/20   Iloabachie, Chioma E, NP  glimepiride (AMARYL) 4 MG tablet Take 1 tablet (4 mg total) by mouth in the morning and  at bedtime. 02/18/20 03/19/20  Iloabachie, Chioma E, NP  glucose blood (TRUE METRIX BLOOD GLUCOSE TEST) test strip Use as instructed 01/31/17   Tresa Garter, MD  HYDROcodone-acetaminophen (NORCO/VICODIN) 5-325 MG tablet Take 1 tablet by mouth every 6 (six) hours as needed for moderate pain. 01/30/20   Kris Hartmann, NP  metFORMIN (GLUCOPHAGE) 1000 MG tablet Take 1 tablet (1,000 mg total) by mouth 2 (two) times daily with a meal. 02/18/20 03/19/20  Iloabachie, Chioma E, NP  metoprolol tartrate (LOPRESSOR) 50 MG tablet Take 1 tablet (50 mg total) by mouth 2 (two) times daily. 02/18/20 03/19/20  Iloabachie, Chioma E, NP  naproxen sodium (ALEVE) 220 MG tablet Take 220 mg by mouth daily. 08/16/19   [provider]  predniSONE (DELTASONE) 20 MG tablet 3 tablets daily x 4 days 02/29/20   Paulette Blanch, MD    Allergies Erythromycin base, Xanax [alprazolam], Benzodiazepines, Methocarbamol, and Sulfa antibiotics  Family History  Problem Relation Age of Onset  . Heart disease Mother   . Diabetes Father   . Heart disease Father     Social History Social History   Tobacco Use  . Smoking status: Current Every Day Smoker    Packs/day: 2.00    Years: 6.00    Pack years: 12.00    Types: Cigarettes  . Smokeless tobacco: Never Used  . Tobacco comment: 1.5-2  Vaping Use  . Vaping Use: Never used  Substance Use Topics  . Alcohol use: No  . Drug use: No    Review of Systems  Constitutional: No fever/chills Eyes: No visual changes. ENT: Positive for lower lip swelling.  No sore throat. Cardiovascular: Denies chest pain. Respiratory: Denies shortness of breath. Gastrointestinal: No abdominal pain.  No nausea, no vomiting.  No diarrhea.  No constipation. Genitourinary: Negative for dysuria. Musculoskeletal: Negative for back pain. Skin: Positive for rash. Neurological: Negative for headaches, focal weakness or numbness.   ____________________________________________   PHYSICAL  EXAM:  VITAL SIGNS: ED Triage Vitals  Enc Vitals Group     BP      Pulse      Resp      Temp      Temp src      SpO2      Weight      Height      Head Circumference      Peak Flow      Pain Score      Pain Loc      Pain Edu?      Excl. in [place]?     Constitutional: Alert and oriented. Well appearing and in no acute distress.  Talkative. Eyes: Conjunctivae are normal. PERRL. EOMI. Head: Atraumatic. Nose: No congestion/rhinnorhea. Mouth/Throat: Mucous membranes are moist.  Mild swelling to lower lip.  Posterior oropharynx widely clear and open.  There is no hoarse or muffled voice.  There is no drooling.  Tolerating secretions well. Neck: No stridor.  No neck swelling.   Cardiovascular: Normal rate, regular rhythm. Grossly normal heart sounds.  Good peripheral circulation. Respiratory: Normal respiratory effort.  No retractions. Lungs CTAB. Gastrointestinal: Soft and nontender. No distention. No abdominal bruits. No CVA tenderness. Musculoskeletal: No lower extremity tenderness nor edema.  No joint effusions. Neurologic:  Normal speech and language. No gross focal neurologic deficits are appreciated.  Skin:  Skin is warm, dry and intact.  Urticaria noted to bilateral upper arms. Psychiatric: Mood and affect are normal. Speech and behavior are normal.  ____________________________________________   LABS (all labs ordered are listed, but only abnormal results are displayed)  Labs Reviewed - No data to display ____________________________________________  EKG  None ____________________________________________  RADIOLOGY  ED MD interpretation: None  Official radiology report(s): No results found.  ____________________________________________   PROCEDURES  Procedure(s) performed (including Critical Care):  .1-3 Lead EKG Interpretation Performed by: Paulette Blanch, MD Authorized by: Paulette Blanch, MD     Interpretation: normal     ECG rate:  80   ECG rate  assessment: normal     Rhythm: sinus rhythm     Ectopy: none     Conduction: normal   Comments:     Patient placed on cardiac monitor to evaluate for arrhythmias     ____________________________________________   INITIAL IMPRESSION / ASSESSMENT AND PLAN / ED COURSE  As part of my medical decision making, I reviewed the following data within the [facility] notes reviewed and incorporated, Old chart reviewed and Notes from prior ED visits     [name] was evaluated in Emergency Department on 02/29/2020 for the symptoms described in the history of present illness. He was evaluated in the context of the global COVID-19 pandemic, which necessitated consideration that the patient might be at risk for infection with the SARS-CoV-2 virus that causes COVID-19. Institutional protocols and algorithms that pertain to the evaluation of patients at risk for COVID-19 are in a state of rapid change based on information released by regulatory bodies including the CDC and federal and state organizations. These policies and algorithms were followed during the patient's care in the ED.    59 year old male brought for urticaria and lower lip swelling.  Will administer IV allergic reaction cocktail to include EpiPen, Benadryl, Solu-Medrol, Pepcid and fluids.  Will monitor in the ED for a minimum of 3 hours.   Clinical Course as of Feb 29 628  Sat Feb 29, 2020  0425 Swelling almost resolved to lower lip.  Patient feeling significantly better.  Room air saturations 97%.  Will discharge home on Prednisone, Pepcid and EpiPen.  Will refer to ENT for allergy testing.  Strict return precautions given.  Patient verbalizes understanding agrees with plan of care.   [JS]    Clinical Course User Index [JS] Paulette Blanch, MD     ____________________________________________   FINAL CLINICAL IMPRESSION(S) / ED DIAGNOSES  Final diagnoses:  Allergic reaction, initial encounter   Angioedema, initial encounter  Urticaria     ED Discharge Orders         Ordered    predniSONE (DELTASONE) 20 MG tablet        02/29/20 0431    EPINEPHrine 0.3 mg/0.3 mL IJ SOAJ injection   Once        02/29/20 0431    famotidine (PEPCID) 20 MG tablet  2 times daily        02/29/20 0431  Note:  This document was prepared using Dragon voice recognition software and may include unintentional dictation errors.   Paulette Blanch, MD 02/29/20 0630

## 2020-02-29 NOTE — ED Notes (Signed)
Pt states itching is relieved and feels like lips are less swollen, mild swelling still visualized to lips.  Pt sleeping comfortably in bed.

## 2020-02-29 NOTE — ED Triage Notes (Addendum)
Pt to ED via EMS from home c/o allergic reaction.  States new medication he's taking is lipitor and plavix.  States itching started 29 hours ago and lip swelling that became worse tonight which made pt call EMS.  Denies SOB, pt A&Ox4, states pain to right foot, in NAD at this time.

## 2020-03-02 ENCOUNTER — Ambulatory Visit: Payer: Self-pay

## 2020-03-03 ENCOUNTER — Ambulatory Visit: Payer: Self-pay | Admitting: Gerontology

## 2020-03-10 ENCOUNTER — Ambulatory Visit: Payer: Self-pay | Admitting: Gerontology

## 2020-03-11 ENCOUNTER — Encounter: Payer: Self-pay | Admitting: Gerontology

## 2020-03-11 ENCOUNTER — Other Ambulatory Visit: Payer: Self-pay

## 2020-03-11 ENCOUNTER — Ambulatory Visit: Payer: Self-pay | Admitting: Gerontology

## 2020-03-11 VITALS — BP 143/89 | HR 72 | Ht 71.0 in | Wt 198.0 lb

## 2020-03-11 DIAGNOSIS — E1140 Type 2 diabetes mellitus with diabetic neuropathy, unspecified: Secondary | ICD-10-CM

## 2020-03-11 DIAGNOSIS — E785 Hyperlipidemia, unspecified: Secondary | ICD-10-CM

## 2020-03-11 DIAGNOSIS — Z8659 Personal history of other mental and behavioral disorders: Secondary | ICD-10-CM | POA: Insufficient documentation

## 2020-03-11 NOTE — Progress Notes (Signed)
Established Patient Office Visit  Subjective:  Patient ID: [name], male    DOB: July 31, 1960  Age: 59 y.o. MRN: [id number]  CC:  Chief Complaint  Patient presents with   Anxiety    HPI [name] presents for follow up of Type 2 diabetes mellitus. He states that he's not checking his blood glucose because of he's stressed out and doesn't want to see his blood glucose reading. His blood glucose was 177 mg/dl when checked during his visit. 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 He states that he's not taking Atorvastatin, Clopidogrel, Gabapentin and Bupropion. He states that he's not taking Atorvastatin because of myalgia. He states that his peripheral neuropathy has worsened and he doesn't want to take his Gabapentin because of his living condition. Overall, he offers no further complaint.  Past Medical History:  Diagnosis Date   Arthritis    Blood clot in vein    Diabetes mellitus    DVT (deep venous thrombosis) ([place])    Hypertension    Neuropathy     Past Surgical History:  Procedure Laterality Date   APPENDECTOMY     LOWER EXTREMITY ANGIOGRAPHY Right 01/27/2020   Procedure: LOWER EXTREMITY ANGIOGRAPHY;  Surgeon: Algernon Huxley, MD;  Location: [facility];  Service: Cardiovascular;  Laterality: Right;   VEIN LIGATION AND STRIPPING     of left leg    Family History  Problem Relation Age of Onset   Heart disease Mother    Diabetes Father    Heart disease Father     Social History   Socioeconomic History   Marital status: Divorced    Spouse name: Not on file   Number of children: 0   Years of education: Not on file   Highest education level: Not on file  Occupational History   Not on file  Tobacco Use   Smoking status: Current Every Day Smoker    Packs/day: 2.00    Years: 6.00    Pack years: 12.00    Types: Cigarettes   Smokeless tobacco: Never Used   Tobacco comment:  1.5-2  Vaping Use   Vaping Use: Never used  Substance and Sexual Activity   Alcohol use: No   Drug use: No   Sexual activity: Not on file  Other Topics Concern   Not on file  Social History Narrative   Social determinants screening completed 09/19/2019. HS      Patient was given resource for bus passes to link transit and non file tax for information for the stimulus check. [facility] referral made on 5/25 for future lab and follow up appointments at the clinic by scheduling ride coordination through [employer]. HS   Social Determinants of Health   Financial Resource Strain: High Risk   Difficulty of Paying Living Expenses: Hard  Food Insecurity: No Food Insecurity   Worried About Charity fundraiser in the Last Year: Never true   Ran Out of Food in the Last Year: Never true  Transportation Needs: Unmet Transportation Needs   Lack of Transportation (Medical): Yes   Lack of Transportation (Non-Medical): Yes  Physical Activity: Insufficiently Active   Days of Exercise per Week: 3 days   Minutes of Exercise per Session: 30 min  Stress: Stress Concern Present   Feeling of Stress : To some extent  Social Connections: Socially Isolated   Frequency of Communication with Friends and  Family: Never   Frequency of Social Gatherings with Friends and Family: Never   Attends Religious Services: Never   Marine scientist or Organizations: No   Attends Music therapist: Never   Marital Status: Divorced  Human resources officer Violence: Not At Risk   Fear of Current or Ex-Partner: No   Emotionally Abused: No   Physically Abused: No   Sexually Abused: No    Outpatient Medications Prior to Visit  Medication Sig Dispense Refill   acetaminophen (TYLENOL) 325 MG tablet Take 650 mg by mouth every 6 (six) hours as needed.      aspirin EC 81 MG tablet Take 1 tablet (81 mg total) by mouth daily. (Patient taking differently: Take 81 mg by mouth 2 (two) times  daily. ) 30 tablet 3   famotidine (PEPCID) 20 MG tablet Take 1 tablet (20 mg total) by mouth 2 (two) times daily. 8 tablet 0   glimepiride (AMARYL) 4 MG tablet Take 1 tablet (4 mg total) by mouth in the morning and at bedtime. 60 tablet 1   metFORMIN (GLUCOPHAGE) 1000 MG tablet Take 1 tablet (1,000 mg total) by mouth 2 (two) times daily with a meal. 60 tablet 1   metoprolol tartrate (LOPRESSOR) 50 MG tablet Take 1 tablet (50 mg total) by mouth 2 (two) times daily. 60 tablet 1   atorvastatin (LIPITOR) 40 MG tablet Take 1 tablet (40 mg total) by mouth daily. (Patient not taking: Reported on 03/11/2020) 30 tablet 3   blood glucose meter kit and supplies KIT Dispense based on patient and insurance preference. Use up to four times daily as directed. (FOR ICD-9 250.00, 250.01). (Patient taking differently: Inject 1 each into the skin See admin instructions. Dispense based on patient and insurance preference. Use up to four times daily as directed. (FOR ICD-9 250.00, 250.01).) 1 each 0   buPROPion (WELLBUTRIN) 100 MG tablet Take 1 tablet (100 mg total) by mouth 2 (two) times daily. (Patient not taking: Reported on 03/11/2020) 60 tablet 1   clopidogrel (PLAVIX) 75 MG tablet Take 1 tablet (75 mg total) by mouth daily. (Patient not taking: Reported on 03/11/2020) 30 tablet 11   gabapentin (NEURONTIN) 100 MG capsule Take 1 capsule (100 mg total) by mouth at bedtime. (Patient not taking: Reported on 03/11/2020) 30 capsule 0   glucose blood (TRUE METRIX BLOOD GLUCOSE TEST) test strip Use as instructed 100 each 12   naproxen sodium (ALEVE) 220 MG tablet Take 220 mg by mouth daily. (Patient not taking: Reported on 03/11/2020)     HYDROcodone-acetaminophen (NORCO/VICODIN) 5-325 MG tablet Take 1 tablet by mouth every 6 (six) hours as needed for moderate pain. 30 tablet 0   predniSONE (DELTASONE) 20 MG tablet 3 tablets daily x 4 days 12 tablet 0   No facility-administered medications prior to visit.    Allergies   Allergen Reactions   Erythromycin Base Anxiety   Xanax [Alprazolam] Other (See Comments)    Tachycardia   Benzodiazepines     PT STATES HE CANNOT TAKE THESE BECAUSE THEY ALMOST KILLED HIM   Methocarbamol Anxiety and Other (See Comments)    Stomach cramping, weakness   Sulfa Antibiotics Nausea And Vomiting and Other (See Comments)    Cramping in stomach and hyperventilate.     ROS Review of Systems  Constitutional: Negative.   Respiratory: Negative.   Cardiovascular: Negative.   Endocrine: Positive for polyuria.  Neurological: Positive for numbness (peripheral neuropathy).  Psychiatric/Behavioral: The patient is nervous/anxious.  Objective:    Physical Exam HENT:     Head: Normocephalic and atraumatic.  Cardiovascular:     Rate and Rhythm: Normal rate and regular rhythm.     Pulses: Normal pulses.     Heart sounds: Normal heart sounds.  Pulmonary:     Effort: Pulmonary effort is normal.     Breath sounds: Normal breath sounds.  Skin:    General: Skin is warm and dry.  Neurological:     General: No focal deficit present.     Mental Status: He is alert and oriented to person, place, and time. Mental status is at baseline.  Psychiatric:        Mood and Affect: Mood normal.        Behavior: Behavior normal.        Thought Content: Thought content normal.        Judgment: Judgment normal.     BP (!) 143/89 (BP Location: Left Arm, Patient Position: Sitting)    Pulse 72    Ht '5\' 11"'  (1.803 m)    Wt 198 lb (89.8 kg)    SpO2 95%    BMI 27.62 kg/m  Wt Readings from Last 3 Encounters:  03/11/20 198 lb (89.8 kg)  02/29/20 207 lb (93.9 kg)  02/25/20 200 lb (90.7 kg)     Health Maintenance Due  Topic Date Due   Hepatitis C Screening  Never done   COVID-19 Vaccine (1) Never done   TETANUS/TDAP  Never done   COLONOSCOPY  Never done   INFLUENZA VACCINE  Never done    There are no preventive care reminders to display for this patient.  Lab Results   Component Value Date   TSH 2.070 09/18/2019   Lab Results  Component Value Date   WBC 6.7 01/17/2020   HGB 17.0 01/17/2020   HCT 51.2 01/17/2020   MCV 95.2 01/17/2020   PLT 132 (L) 01/17/2020   Lab Results  Component Value Date   NA 140 01/17/2020   K 4.8 01/17/2020   CO2 29 01/17/2020   GLUCOSE 120 (H) 01/17/2020   BUN 8 01/27/2020   CREATININE 0.70 01/27/2020   BILITOT 0.7 01/15/2020   ALKPHOS 64 01/15/2020   AST 15 01/15/2020   ALT 15 01/15/2020   PROT 6.6 01/15/2020   ALBUMIN 4.2 01/15/2020   CALCIUM 9.6 01/17/2020   ANIONGAP 7 01/17/2020   Lab Results  Component Value Date   CHOL 191 09/18/2019   Lab Results  Component Value Date   HDL 37 (L) 09/18/2019   Lab Results  Component Value Date   LDLCALC 127 (H) 09/18/2019   Lab Results  Component Value Date   TRIG 151 (H) 09/18/2019   Lab Results  Component Value Date   CHOLHDL 5.2 (H) 09/18/2019   Lab Results  Component Value Date   HGBA1C 10.6 (A) 01/21/2020      Assessment & Plan:   1. Type 2 diabetes mellitus with diabetic neuropathy, without long-term current use of insulin (HCC) - His blood glucose is no controlled due to non compliance. He was advised to check his blood glucose at least once daily., his goal fasting reading should be between 80-130 mg/dl. He was encouraged to continue on low carb/ non concentrated sweet diet. - HgB A1c; Future  2. History of anxiety - He was advised to call the Crisis Help line for worsening anxiety. -He will follow up with [place] and Social work for [employer].  3.  Dyslipidemia - He self declined Atorvastatin due to myalgia, will check CK. - CK (Creatine Kinase)     Follow-up: Return in about 7 weeks (around 04/29/2020), or if symptoms worsen or fail to improve.    Dmari Schubring Jerold Coombe, NP

## 2020-03-11 NOTE — Patient Instructions (Signed)
Carbohydrate Counting for Diabetes Mellitus, Adult  Carbohydrate counting is a method of keeping track of how many carbohydrates you eat. Eating carbohydrates naturally increases the amount of sugar (glucose) in the blood. Counting how many carbohydrates you eat helps keep your blood glucose within normal limits, which helps you manage your diabetes (diabetes mellitus). It is important to know how many carbohydrates you can safely have in each meal. This is different for every person. A diet and nutrition specialist (registered dietitian) can help you make a meal plan and calculate how many carbohydrates you should have at each meal and snack. Carbohydrates are found in the following foods:  Grains, such as breads and cereals.  Dried beans and soy products.  Starchy vegetables, such as potatoes, peas, and corn.  Fruit and fruit juices.  Milk and yogurt.  Sweets and snack foods, such as cake, cookies, candy, chips, and soft drinks. How do I count carbohydrates? There are two ways to count carbohydrates in food. You can use either of the methods or a combination of both. Reading "Nutrition Facts" on packaged food The "Nutrition Facts" list is included on the labels of almost all packaged foods and beverages in the [place] It includes:  The serving size.  Information about nutrients in each serving, including the grams (g) of carbohydrate per serving. To use the "Nutrition Facts":  Decide how many servings you will have.  Multiply the number of servings by the number of carbohydrates per serving.  The resulting number is the total amount of carbohydrates that you will be having. Learning standard serving sizes of other foods When you eat carbohydrate foods that are not packaged or do not include "Nutrition Facts" on the label, you need to measure the servings in order to count the amount of carbohydrates:  Measure the foods that you will eat with a food scale or measuring cup, if  needed.  Decide how many standard-size servings you will eat.  Multiply the number of servings by 15. Most carbohydrate-rich foods have about 15 g of carbohydrates per serving. ? For example, if you eat 8 oz (170 g) of strawberries, you will have eaten 2 servings and 30 g of carbohydrates (2 servings x 15 g = 30 g).  For foods that have more than one food mixed, such as soups and casseroles, you must count the carbohydrates in each food that is included. The following list contains standard serving sizes of common carbohydrate-rich foods. Each of these servings has about 15 g of carbohydrates:   hamburger bun or  English muffin.   oz (15 mL) syrup.   oz (14 g) jelly.  1 slice of bread.  1 six-inch tortilla.  3 oz (85 g) cooked rice or pasta.  4 oz (113 g) cooked dried beans.  4 oz (113 g) starchy vegetable, such as peas, corn, or potatoes.  4 oz (113 g) hot cereal.  4 oz (113 g) mashed potatoes or  of a large baked potato.  4 oz (113 g) canned or frozen fruit.  4 oz (120 mL) fruit juice.  4-6 crackers.  6 chicken nuggets.  6 oz (170 g) unsweetened dry cereal.  6 oz (170 g) plain fat-free yogurt or yogurt sweetened with artificial sweeteners.  8 oz (240 mL) milk.  8 oz (170 g) fresh fruit or one small piece of fruit.  24 oz (680 g) popped popcorn. Example of carbohydrate counting Sample meal  3 oz (85 g) chicken breast.  6 oz (170 g)   brown rice.  4 oz (113 g) corn.  8 oz (240 mL) milk.  8 oz (170 g) strawberries with sugar-free whipped topping. Carbohydrate calculation 1. Identify the foods that contain carbohydrates: ? Rice. ? Corn. ? Milk. ? Strawberries. 2. Calculate how many servings you have of each food: ? 2 servings rice. ? 1 serving corn. ? 1 serving milk. ? 1 serving strawberries. 3. Multiply each number of servings by 15 g: ? 2 servings rice x 15 g = 30 g. ? 1 serving corn x 15 g = 15 g. ? 1 serving milk x 15 g = 15 g. ? 1  serving strawberries x 15 g = 15 g. 4. Add together all of the amounts to find the total grams of carbohydrates eaten: ? 30 g + 15 g + 15 g + 15 g = 75 g of carbohydrates total. Summary  Carbohydrate counting is a method of keeping track of how many carbohydrates you eat.  Eating carbohydrates naturally increases the amount of sugar (glucose) in the blood.  Counting how many carbohydrates you eat helps keep your blood glucose within normal limits, which helps you manage your diabetes.  A diet and nutrition specialist (registered dietitian) can help you make a meal plan and calculate how many carbohydrates you should have at each meal and snack. This information is not intended to replace advice given to you by your health care provider. Make sure you discuss any questions you have with your health care provider. Document Revised: 01/12/2017 Document Reviewed: 12/02/2015 Elsevier Patient Education  2020 Elsevier Inc.  

## 2020-03-12 LAB — CK: Total CK: 46 U/L (ref 41–331)

## 2020-04-22 ENCOUNTER — Other Ambulatory Visit: Payer: Self-pay

## 2020-04-22 DIAGNOSIS — E785 Hyperlipidemia, unspecified: Secondary | ICD-10-CM

## 2020-04-22 DIAGNOSIS — E1140 Type 2 diabetes mellitus with diabetic neuropathy, unspecified: Secondary | ICD-10-CM

## 2020-04-23 LAB — LIPID PANEL
Chol/HDL Ratio: 4.7 ratio (ref 0.0–5.0)
Cholesterol, Total: 151 mg/dL (ref 100–199)
HDL: 32 mg/dL — ABNORMAL LOW (ref >39)
LDL Chol Calc (NIH): 93 mg/dL (ref 0–99)
Triglycerides: 147 mg/dL (ref 0–149)
VLDL Cholesterol Cal: 26 mg/dL (ref 5–40)

## 2020-04-23 LAB — HEMOGLOBIN A1C
Est. average glucose Bld gHb Est-mCnc: 235 mg/dL
Hgb A1c MFr Bld: 9.8 % — ABNORMAL HIGH (ref 4.8–5.6)

## 2020-04-29 ENCOUNTER — Ambulatory Visit: Payer: Self-pay | Admitting: Gerontology

## 2020-04-30 ENCOUNTER — Telehealth: Payer: Self-pay | Admitting: Gerontology

## 2020-04-30 DIAGNOSIS — Z0289 Encounter for other administrative examinations: Secondary | ICD-10-CM

## 2020-04-30 NOTE — Telephone Encounter (Signed)
Called pt and unable to LVM to reschedule appointment on 10/28/201, at 1:46pm. CV

## 2020-04-30 NOTE — Telephone Encounter (Signed)
-----   Message from Willette Brace sent at 04/30/2020  9:18 AM EDT ----- Attempted call on 10/28, but call could not be completed. ----- Message ----- From: Willette Brace Sent: 04/30/2020   8:54 AM EDT To: Bod Admin  Pt no showed to his appt. Please call and reschedule him an appt with Lanora Manis.

## 2020-05-05 ENCOUNTER — Other Ambulatory Visit (INDEPENDENT_AMBULATORY_CARE_PROVIDER_SITE_OTHER): Payer: Self-pay | Admitting: Vascular Surgery

## 2020-05-05 ENCOUNTER — Other Ambulatory Visit: Payer: Self-pay | Admitting: Gerontology

## 2020-05-05 ENCOUNTER — Ambulatory Visit: Payer: Self-pay | Admitting: Gerontology

## 2020-05-05 ENCOUNTER — Other Ambulatory Visit: Payer: Self-pay

## 2020-05-05 VITALS — BP 147/89 | HR 75 | Temp 97.3°F | Resp 16 | Wt 198.8 lb

## 2020-05-05 DIAGNOSIS — I739 Peripheral vascular disease, unspecified: Secondary | ICD-10-CM

## 2020-05-05 DIAGNOSIS — E1149 Type 2 diabetes mellitus with other diabetic neurological complication: Secondary | ICD-10-CM

## 2020-05-05 DIAGNOSIS — E1140 Type 2 diabetes mellitus with diabetic neuropathy, unspecified: Secondary | ICD-10-CM

## 2020-05-05 DIAGNOSIS — Z8659 Personal history of other mental and behavioral disorders: Secondary | ICD-10-CM

## 2020-05-05 DIAGNOSIS — H6123 Impacted cerumen, bilateral: Secondary | ICD-10-CM

## 2020-05-05 DIAGNOSIS — I10 Essential (primary) hypertension: Secondary | ICD-10-CM

## 2020-05-05 DIAGNOSIS — F17200 Nicotine dependence, unspecified, uncomplicated: Secondary | ICD-10-CM

## 2020-05-05 MED ORDER — GLIMEPIRIDE 4 MG PO TABS: 4.0000 mg | ORAL_TABLET | Freq: Two times a day (BID) | ORAL | 2 refills | Status: DC

## 2020-05-05 MED ORDER — DEBROX 6.5 % OT SOLN: 5.0000 [drp] | Freq: Two times a day (BID) | OTIC | 0 refills | Status: DC

## 2020-05-05 MED ORDER — METOPROLOL TARTRATE 50 MG PO TABS: 50.0000 mg | ORAL_TABLET | Freq: Two times a day (BID) | ORAL | 2 refills | Status: DC

## 2020-05-05 MED ORDER — METFORMIN HCL 1000 MG PO TABS: 1000.0000 mg | ORAL_TABLET | Freq: Two times a day (BID) | ORAL | 2 refills | Status: DC

## 2020-05-05 NOTE — Patient Instructions (Signed)
DASH Eating Plan DASH stands for "Dietary Approaches to Stop Hypertension." The DASH eating plan is a healthy eating plan that has been shown to reduce high blood pressure (hypertension). It may also reduce your risk for type 2 diabetes, heart disease, and stroke. The DASH eating plan may also help with weight loss. What are tips for following this plan?  General guidelines  Avoid eating more than 2,300 mg (milligrams) of salt (sodium) a day. If you have hypertension, you may need to reduce your sodium intake to 1,500 mg a day.  Limit alcohol intake to no more than 1 drink a day for nonpregnant women and 2 drinks a day for men. One drink equals 12 oz of beer, 5 oz of wine, or 1 oz of hard liquor.  Work with your health care provider to maintain a healthy body weight or to lose weight. Ask what an ideal weight is for you.  Get at least 30 minutes of exercise that causes your heart to beat faster (aerobic exercise) most days of the week. Activities may include walking, swimming, or biking.  Work with your health care provider or diet and nutrition specialist (dietitian) to adjust your eating plan to your individual calorie needs. Reading food labels   Check food labels for the amount of sodium per serving. Choose foods with less than 5 percent of the Daily Value of sodium. Generally, foods with less than 300 mg of sodium per serving fit into this eating plan.  To find whole grains, look for the word "whole" as the first word in the ingredient list. Shopping  Buy products labeled as "low-sodium" or "no salt added."  Buy fresh foods. Avoid canned foods and premade or frozen meals. Cooking  Avoid adding salt when cooking. Use salt-free seasonings or herbs instead of table salt or sea salt. Check with your health care provider or pharmacist before using salt substitutes.  Do not fry foods. Cook foods using healthy methods such as baking, boiling, grilling, and broiling instead.  Cook with  heart-healthy oils, such as olive, canola, soybean, or sunflower oil. Meal planning  Eat a balanced diet that includes: ? 5 or more servings of fruits and vegetables each day. At each meal, try to fill half of your plate with fruits and vegetables. ? Up to 6-8 servings of whole grains each day. ? Less than 6 oz of lean meat, poultry, or fish each day. A 3-oz serving of meat is about the same size as a deck of cards. One egg equals 1 oz. ? 2 servings of low-fat dairy each day. ? A serving of nuts, seeds, or beans 5 times each week. ? Heart-healthy fats. Healthy fats called Omega-3 fatty acids are found in foods such as flaxseeds and coldwater fish, like sardines, salmon, and mackerel.  Limit how much you eat of the following: ? Canned or prepackaged foods. ? Food that is high in trans fat, such as fried foods. ? Food that is high in saturated fat, such as fatty meat. ? Sweets, desserts, sugary drinks, and other foods with added sugar. ? Full-fat dairy products.  Do not salt foods before eating.  Try to eat at least 2 vegetarian meals each week.  Eat more home-cooked food and less restaurant, buffet, and fast food.  When eating at a restaurant, ask that your food be prepared with less salt or no salt, if possible. What foods are recommended? The items listed may not be a complete list. Talk with your dietitian about   what dietary choices are best for you. Grains Whole-grain or whole-wheat bread. Whole-grain or whole-wheat pasta. Brown rice. Oatmeal. Quinoa. Bulgur. Whole-grain and low-sodium cereals. Pita bread. Low-fat, low-sodium crackers. Whole-wheat flour tortillas. Vegetables Fresh or frozen vegetables (raw, steamed, roasted, or grilled). Low-sodium or reduced-sodium tomato and vegetable juice. Low-sodium or reduced-sodium tomato sauce and tomato paste. Low-sodium or reduced-sodium canned vegetables. Fruits All fresh, dried, or frozen fruit. Canned fruit in natural juice (without  added sugar). Meat and other protein foods Skinless chicken or turkey. Ground chicken or turkey. Pork with fat trimmed off. Fish and seafood. Egg whites. Dried beans, peas, or lentils. Unsalted nuts, nut butters, and seeds. Unsalted canned beans. Lean cuts of beef with fat trimmed off. Low-sodium, lean deli meat. Dairy Low-fat (1%) or fat-free (skim) milk. Fat-free, low-fat, or reduced-fat cheeses. Nonfat, low-sodium ricotta or cottage cheese. Low-fat or nonfat yogurt. Low-fat, low-sodium cheese. Fats and oils Soft margarine without trans fats. Vegetable oil. Low-fat, reduced-fat, or light mayonnaise and salad dressings (reduced-sodium). Canola, safflower, olive, soybean, and sunflower oils. Avocado. Seasoning and other foods Herbs. Spices. Seasoning mixes without salt. Unsalted popcorn and pretzels. Fat-free sweets. What foods are not recommended? The items listed may not be a complete list. Talk with your dietitian about what dietary choices are best for you. Grains Baked goods made with fat, such as croissants, muffins, or some breads. Dry pasta or rice meal packs. Vegetables Creamed or fried vegetables. Vegetables in a cheese sauce. Regular canned vegetables (not low-sodium or reduced-sodium). Regular canned tomato sauce and paste (not low-sodium or reduced-sodium). Regular tomato and vegetable juice (not low-sodium or reduced-sodium). Pickles. Olives. Fruits Canned fruit in a light or heavy syrup. Fried fruit. Fruit in cream or butter sauce. Meat and other protein foods Fatty cuts of meat. Ribs. Fried meat. Bacon. Sausage. Bologna and other processed lunch meats. Salami. Fatback. Hotdogs. Bratwurst. Salted nuts and seeds. Canned beans with added salt. Canned or smoked fish. Whole eggs or egg yolks. Chicken or turkey with skin. Dairy Whole or 2% milk, cream, and half-and-half. Whole or full-fat cream cheese. Whole-fat or sweetened yogurt. Full-fat cheese. Nondairy creamers. Whipped toppings.  Processed cheese and cheese spreads. Fats and oils Butter. Stick margarine. Lard. Shortening. Ghee. Bacon fat. Tropical oils, such as coconut, palm kernel, or palm oil. Seasoning and other foods Salted popcorn and pretzels. Onion salt, garlic salt, seasoned salt, table salt, and sea salt. Worcestershire sauce. Tartar sauce. Barbecue sauce. Teriyaki sauce. Soy sauce, including reduced-sodium. Steak sauce. Canned and packaged gravies. Fish sauce. Oyster sauce. Cocktail sauce. Horseradish that you find on the shelf. Ketchup. Mustard. Meat flavorings and tenderizers. Bouillon cubes. Hot sauce and Tabasco sauce. Premade or packaged marinades. Premade or packaged taco seasonings. Relishes. Regular salad dressings. Where to find more information:  National Heart, Lung, and Blood Institute: www.nhlbi.nih.gov  American Heart Association: www.heart.org Summary  The DASH eating plan is a healthy eating plan that has been shown to reduce high blood pressure (hypertension). It may also reduce your risk for type 2 diabetes, heart disease, and stroke.  With the DASH eating plan, you should limit salt (sodium) intake to 2,300 mg a day. If you have hypertension, you may need to reduce your sodium intake to 1,500 mg a day.  When on the DASH eating plan, aim to eat more fresh fruits and vegetables, whole grains, lean proteins, low-fat dairy, and heart-healthy fats.  Work with your health care provider or diet and nutrition specialist (dietitian) to adjust your eating plan to your   individual calorie needs. This information is not intended to replace advice given to you by your health care provider. Make sure you discuss any questions you have with your health care provider. Document Revised: 06/02/2017 Document Reviewed: 06/13/2016 Elsevier Patient Education  2020 Elsevier Inc. Carbohydrate Counting for Diabetes Mellitus, Adult  Carbohydrate counting is a method of keeping track of how many carbohydrates you  eat. Eating carbohydrates naturally increases the amount of sugar (glucose) in the blood. Counting how many carbohydrates you eat helps keep your blood glucose within normal limits, which helps you manage your diabetes (diabetes mellitus). It is important to know how many carbohydrates you can safely have in each meal. This is different for every person. A diet and nutrition specialist (registered dietitian) can help you make a meal plan and calculate how many carbohydrates you should have at each meal and snack. Carbohydrates are found in the following foods:  Grains, such as breads and cereals.  Dried beans and soy products.  Starchy vegetables, such as potatoes, peas, and corn.  Fruit and fruit juices.  Milk and yogurt.  Sweets and snack foods, such as cake, cookies, candy, chips, and soft drinks. How do I count carbohydrates? There are two ways to count carbohydrates in food. You can use either of the methods or a combination of both. Reading "Nutrition Facts" on packaged food The "Nutrition Facts" list is included on the labels of almost all packaged foods and beverages in the [place] It includes:  The serving size.  Information about nutrients in each serving, including the grams (g) of carbohydrate per serving. To use the "Nutrition Facts":  Decide how many servings you will have.  Multiply the number of servings by the number of carbohydrates per serving.  The resulting number is the total amount of carbohydrates that you will be having. Learning standard serving sizes of other foods When you eat carbohydrate foods that are not packaged or do not include "Nutrition Facts" on the label, you need to measure the servings in order to count the amount of carbohydrates:  Measure the foods that you will eat with a food scale or measuring cup, if needed.  Decide how many standard-size servings you will eat.  Multiply the number of servings by 15. Most carbohydrate-rich foods have  about 15 g of carbohydrates per serving. ? For example, if you eat 8 oz (170 g) of strawberries, you will have eaten 2 servings and 30 g of carbohydrates (2 servings x 15 g = 30 g).  For foods that have more than one food mixed, such as soups and casseroles, you must count the carbohydrates in each food that is included. The following list contains standard serving sizes of common carbohydrate-rich foods. Each of these servings has about 15 g of carbohydrates:   hamburger bun or  English muffin.   oz (15 mL) syrup.   oz (14 g) jelly.  1 slice of bread.  1 six-inch tortilla.  3 oz (85 g) cooked rice or pasta.  4 oz (113 g) cooked dried beans.  4 oz (113 g) starchy vegetable, such as peas, corn, or potatoes.  4 oz (113 g) hot cereal.  4 oz (113 g) mashed potatoes or  of a large baked potato.  4 oz (113 g) canned or frozen fruit.  4 oz (120 mL) fruit juice.  4-6 crackers.  6 chicken nuggets.  6 oz (170 g) unsweetened dry cereal.  6 oz (170 g) plain fat-free yogurt or yogurt sweetened with   artificial sweeteners.  8 oz (240 mL) milk.  8 oz (170 g) fresh fruit or one small piece of fruit.  24 oz (680 g) popped popcorn. Example of carbohydrate counting Sample meal  3 oz (85 g) chicken breast.  6 oz (170 g) brown rice.  4 oz (113 g) corn.  8 oz (240 mL) milk.  8 oz (170 g) strawberries with sugar-free whipped topping. Carbohydrate calculation 1. Identify the foods that contain carbohydrates: ? Rice. ? Corn. ? Milk. ? Strawberries. 2. Calculate how many servings you have of each food: ? 2 servings rice. ? 1 serving corn. ? 1 serving milk. ? 1 serving strawberries. 3. Multiply each number of servings by 15 g: ? 2 servings rice x 15 g = 30 g. ? 1 serving corn x 15 g = 15 g. ? 1 serving milk x 15 g = 15 g. ? 1 serving strawberries x 15 g = 15 g. 4. Add together all of the amounts to find the total grams of carbohydrates eaten: ? 30 g + 15 g + 15 g + 15  g = 75 g of carbohydrates total. Summary  Carbohydrate counting is a method of keeping track of how many carbohydrates you eat.  Eating carbohydrates naturally increases the amount of sugar (glucose) in the blood.  Counting how many carbohydrates you eat helps keep your blood glucose within normal limits, which helps you manage your diabetes.  A diet and nutrition specialist (registered dietitian) can help you make a meal plan and calculate how many carbohydrates you should have at each meal and snack. This information is not intended to replace advice given to you by your health care provider. Make sure you discuss any questions you have with your health care provider. Document Revised: 01/12/2017 Document Reviewed: 12/02/2015 Elsevier Patient Education  2020 Elsevier Inc.  

## 2020-05-05 NOTE — Progress Notes (Signed)
Established Patient Office Visit  Subjective:  Patient ID: [name], male    DOB: 03-28-1961  Age: 59 y.o. MRN: [id number]  CC: No chief complaint on file.   HPI [name] presents for lab review. His Hgb A1c drawn on 04/22/20 was 9.8% Patient is not compliant with medications. He states that he "thinks" he has been taking his metformin, glimepiride, metoprolol, and aspirin. He states that he is not checking his blood glucose at home. Denies hypo-/ hyperglycemic episodes. Blood sugar was 300 mg/dL in clinic today. He states that he had a chocolate donut prior to arrival. He reports that his peripheral neuropathy is still bothering him. He was advised to take his gabapentin at last visit and reports that he still had not started it. He states that he is going to start it this week. Also reports that his ears feel "clogged." Denies pain, discharge or alteration in hearing. He offers no further complaint.   Past Medical History:  Diagnosis Date  . Arthritis   . Blood clot in vein   . Diabetes mellitus   . DVT (deep venous thrombosis) ([employer])   . Hypertension   . Neuropathy     Past Surgical History:  Procedure Laterality Date  . APPENDECTOMY    . LOWER EXTREMITY ANGIOGRAPHY Right 01/27/2020   Procedure: LOWER EXTREMITY ANGIOGRAPHY;  Surgeon: Algernon Huxley, MD;  Location: [facility];  Service: Cardiovascular;  Laterality: Right;  . VEIN LIGATION AND STRIPPING     of left leg    Family History  Problem Relation Age of Onset  . Heart disease Mother   . Diabetes Father   . Heart disease Father     Social History   Socioeconomic History  . Marital status: Divorced    Spouse name: Not on file  . Number of children: 0  . Years of education: Not on file  . Highest education level: Not on file  Occupational History  . Not on file  Tobacco Use  . Smoking status: Current Every Day Smoker    Packs/day: 2.00    Years: 6.00    Pack years: 12.00    Types: Cigarettes   . Smokeless tobacco: Never Used  . Tobacco comment: 1.5-2  Vaping Use  . Vaping Use: Never used  Substance and Sexual Activity  . Alcohol use: No  . Drug use: No  . Sexual activity: Not on file  Other Topics Concern  . Not on file  Social History Narrative   Social determinants screening completed 09/19/2019. HS      Patient was given resource for bus passes to link transit and non file tax for information for the stimulus check. [facility] referral made on 5/25 for future lab and follow up appointments at the clinic by scheduling ride coordination through [employer]. HS   Social Determinants of Health   Financial Resource Strain: High Risk  . Difficulty of Paying Living Expenses: Hard  Food Insecurity: No Food Insecurity  . Worried About Charity fundraiser in the Last Year: Never true  . Ran Out of Food in the Last Year: Never true  Transportation Needs: Unmet Transportation Needs  . Lack of Transportation (Medical): Yes  . Lack of Transportation (Non-Medical): Yes  Physical Activity: Insufficiently Active  . Days of Exercise per Week: 3 days  . Minutes of Exercise per Session: 30 min  Stress: Stress Concern Present  . Feeling of Stress : To some extent  Social  Connections: Socially Isolated  . Frequency of Communication with Friends and Family: Never  . Frequency of Social Gatherings with Friends and Family: Never  . Attends Religious Services: Never  . Active Member of Clubs or Organizations: No  . Attends Archivist Meetings: Never  . Marital Status: Divorced  Human resources officer Violence: Not At Risk  . Fear of Current or Ex-Partner: No  . Emotionally Abused: No  . Physically Abused: No  . Sexually Abused: No    Outpatient Medications Prior to Visit  Medication Sig Dispense Refill  . aspirin EC 81 MG tablet Take 1 tablet (81 mg total) by mouth daily. (Patient taking differently: Take 81 mg by mouth 2 (two) times daily. ) 30 tablet 3  . atorvastatin (LIPITOR)  40 MG tablet Take 1 tablet (40 mg total) by mouth daily. 30 tablet 3  . famotidine (PEPCID) 20 MG tablet Take 1 tablet (20 mg total) by mouth 2 (two) times daily. 8 tablet 0  . acetaminophen (TYLENOL) 325 MG tablet Take 650 mg by mouth every 6 (six) hours as needed.     . blood glucose meter kit and supplies KIT Dispense based on patient and insurance preference. Use up to four times daily as directed. (FOR ICD-9 250.00, 250.01). (Patient taking differently: Inject 1 each into the skin See admin instructions. Dispense based on patient and insurance preference. Use up to four times daily as directed. (FOR ICD-9 250.00, 250.01).) 1 each 0  . clopidogrel (PLAVIX) 75 MG tablet Take 1 tablet (75 mg total) by mouth daily. (Patient not taking: Reported on 03/11/2020) 30 tablet 11  . gabapentin (NEURONTIN) 100 MG capsule Take 1 capsule (100 mg total) by mouth at bedtime. (Patient not taking: Reported on 03/11/2020) 30 capsule 0  . glucose blood (TRUE METRIX BLOOD GLUCOSE TEST) test strip Use as instructed 100 each 12  . buPROPion (WELLBUTRIN) 100 MG tablet Take 1 tablet (100 mg total) by mouth 2 (two) times daily. (Patient not taking: Reported on 03/11/2020) 60 tablet 1  . glimepiride (AMARYL) 4 MG tablet Take 1 tablet (4 mg total) by mouth in the morning and at bedtime. 60 tablet 1  . metFORMIN (GLUCOPHAGE) 1000 MG tablet Take 1 tablet (1,000 mg total) by mouth 2 (two) times daily with a meal. 60 tablet 1  . metoprolol tartrate (LOPRESSOR) 50 MG tablet Take 1 tablet (50 mg total) by mouth 2 (two) times daily. 60 tablet 1  . naproxen sodium (ALEVE) 220 MG tablet Take 220 mg by mouth daily. (Patient not taking: Reported on 03/11/2020)     No facility-administered medications prior to visit.    Allergies  Allergen Reactions  . Erythromycin Base Anxiety  . Xanax [Alprazolam] Other (See Comments)    Tachycardia  . Benzodiazepines     PT STATES HE CANNOT TAKE THESE BECAUSE THEY ALMOST KILLED HIM  . Methocarbamol  Anxiety and Other (See Comments)    Stomach cramping, weakness  . Sulfa Antibiotics Nausea And Vomiting and Other (See Comments)    Cramping in stomach and hyperventilate.     ROS Review of Systems  Constitutional: Negative.   HENT: Positive for congestion.        Ears feel clogged  Respiratory: Negative.   Cardiovascular: Negative.   Gastrointestinal: Negative.   Endocrine: Positive for polyphagia and polyuria.  Genitourinary: Negative.   Skin: Negative.       Objective:    Physical Exam Constitutional:      Appearance: Normal appearance.  HENT:     Right Ear: Hearing and external ear normal.     Left Ear: Hearing and external ear normal.     Ears:   Cardiovascular:     Rate and Rhythm: Normal rate and regular rhythm.     Pulses: Normal pulses.     Heart sounds: Normal heart sounds.  Pulmonary:     Breath sounds: Normal breath sounds.  Abdominal:     General: Abdomen is flat. Bowel sounds are normal.     Palpations: Abdomen is soft.  Musculoskeletal:        General: Normal range of motion.  Skin:    General: Skin is warm and dry.     Capillary Refill: Capillary refill takes less than 2 seconds.  Neurological:     General: No focal deficit present.     Mental Status: He is alert and oriented to person, place, and time.     BP (!) 147/89   Pulse 75   Temp (!) 97.3 F (36.3 C)   Resp 16   Wt 198 lb 12.8 oz (90.2 kg)   SpO2 100%   BMI 27.73 kg/m  Wt Readings from Last 3 Encounters:  05/05/20 198 lb 12.8 oz (90.2 kg)  03/11/20 198 lb (89.8 kg)  02/29/20 207 lb (93.9 kg)     Health Maintenance Due  Topic Date Due  . Hepatitis C Screening  Never done  . COVID-19 Vaccine (1) Never done  . TETANUS/TDAP  Never done  . COLONOSCOPY  Never done    There are no preventive care reminders to display for this patient.  Lab Results  Component Value Date   TSH 2.070 09/18/2019   Lab Results  Component Value Date   WBC 6.7 01/17/2020   HGB 17.0  01/17/2020   HCT 51.2 01/17/2020   MCV 95.2 01/17/2020   PLT 132 (L) 01/17/2020   Lab Results  Component Value Date   NA 140 01/17/2020   K 4.8 01/17/2020   CO2 29 01/17/2020   GLUCOSE 120 (H) 01/17/2020   BUN 8 01/27/2020   CREATININE 0.70 01/27/2020   BILITOT 0.7 01/15/2020   ALKPHOS 64 01/15/2020   AST 15 01/15/2020   ALT 15 01/15/2020   PROT 6.6 01/15/2020   ALBUMIN 4.2 01/15/2020   CALCIUM 9.6 01/17/2020   ANIONGAP 7 01/17/2020   Lab Results  Component Value Date   CHOL 151 04/22/2020   Lab Results  Component Value Date   HDL 32 (L) 04/22/2020   Lab Results  Component Value Date   LDLCALC 93 04/22/2020   Lab Results  Component Value Date   TRIG 147 04/22/2020   Lab Results  Component Value Date   CHOLHDL 4.7 04/22/2020   Lab Results  Component Value Date   HGBA1C 9.8 (H) 04/22/2020      Assessment & Plan:   1. Type 2 diabetes mellitus with other neurologic complication, without long-term current use of insulin (HCC) A1c has improved from 10.6% (on 7/20) to 9.8% (on 10/20). His goal should be <7%.  Advised to take all of his medications as prescribed and bring all of his medications to his next visit. Advised to follow a low concentrated sugar/ low carb diet. Advised to check his sugar twice daily and bring a log to his next visit. - glimepiride (AMARYL) 4 MG tablet; Take 1 tablet (4 mg total) by mouth in the morning and at bedtime.  Dispense: 60 tablet; Refill: 2 - metFORMIN (GLUCOPHAGE) 1000  MG tablet; Take 1 tablet (1,000 mg total) by mouth 2 (two) times daily with a meal.  Dispense: 60 tablet; Refill: 2 - HgB A1c; Future  2. Essential hypertension Hypertension is not controlled. His goal should be <140/89. Advised on medication compliance. Advised to follow the DASH diet. Advised to stop smoking and provided [place] Quitline information. Encouraged to increase activity as tolerated with a goal of 150 mins of exercise weekly.  - metoprolol tartrate  (LOPRESSOR) 50 MG tablet; Take 1 tablet (50 mg total) by mouth 2 (two) times daily.  Dispense: 60 tablet; Refill: 2  3. Tobacco use disorder Patient reports smoking about a pack of cigarettes daily. Advised to stop smoking and provided  Quitline information.  4. History of anxiety Encouraged medication compliance. Follow up appointment scheduled with Heather.  5. Excessive cerumen in ear canal, bilateral Start debrox as prescribed. Advised against using cotton swabs (Q tips) to clean ears.  - carbamide peroxide (DEBROX) 6.5 % OTIC solution; Place 5 drops into both ears 2 (two) times daily.  Dispense: 15 mL; Refill: 0   Follow-up: Return in about 3 months (around 07/29/2020), or if symptoms worsen or fail to improve.    Marina Gravel, Student-NP

## 2020-05-12 ENCOUNTER — Telehealth: Payer: Self-pay

## 2020-05-12 NOTE — Telephone Encounter (Signed)
Called patient to see if he was coming into the clinic on November 30th or if he would be meeting via phone with Rhett Bannister.  Patient wants to meet via phone.

## 2020-05-20 ENCOUNTER — Ambulatory Visit: Payer: Self-pay | Admitting: Licensed Clinical Social Worker

## 2020-05-22 ENCOUNTER — Other Ambulatory Visit (INDEPENDENT_AMBULATORY_CARE_PROVIDER_SITE_OTHER): Payer: Self-pay | Admitting: Nurse Practitioner

## 2020-05-22 DIAGNOSIS — I739 Peripheral vascular disease, unspecified: Secondary | ICD-10-CM

## 2020-05-26 ENCOUNTER — Ambulatory Visit (INDEPENDENT_AMBULATORY_CARE_PROVIDER_SITE_OTHER): Payer: Self-pay | Admitting: Vascular Surgery

## 2020-05-26 ENCOUNTER — Encounter (INDEPENDENT_AMBULATORY_CARE_PROVIDER_SITE_OTHER): Payer: Self-pay

## 2020-06-02 ENCOUNTER — Other Ambulatory Visit: Payer: Self-pay

## 2020-06-02 ENCOUNTER — Ambulatory Visit: Payer: Self-pay | Admitting: Licensed Clinical Social Worker

## 2020-06-02 DIAGNOSIS — F341 Dysthymic disorder: Secondary | ICD-10-CM

## 2020-06-02 DIAGNOSIS — F411 Generalized anxiety disorder: Secondary | ICD-10-CM

## 2020-06-02 NOTE — BH Specialist Note (Signed)
Integrated Behavioral Health Follow Up In-Person Visit  MRN: [id number] Name: [name]    Types of Service: General Behavioral Integrated Care (BHI)  Interpretor:No. Interpretor Name and Language:none  Subjective: [name] is a 59 y.o. male accompanied by himself Patient was referred by Hurman Horn, NP  for mental health Patient reports the following symptoms/concerns: [name] reports that his anxiety has increased over the last couple of months. 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 The patient reports that he has difficulty sleeping often only sleeping three to four hours in a 24 hour cycle. He notes that he does not go to bed until 1 or 2 AM and wakes up often finally getting up for good at about 4:30 AM. He  discussed financial concerns and the lack of resources. He notes that he is riding a scooter as his sole transportation and that it is in need of major repairs. He reports that he often begins projects and is unable to complete them. He feels that his strength is his creativity. He patient noted that he was concerned that his psychotropic medication would not be refilled if he did not show up for today's visit. The patient denied suicidal or homicidal thoughts, and further denied substance of alcohol abuse.  Duration of problem: ; Severity of problem: moderate  Objective: Mood: Euthymic and Affect: Appropriate Risk of harm to self or others: No plan to harm self or others  Life Context: Family and Social: see above School/Work: see above Self-Care: see above Life Changes: see above  Patient and/or Family's Strengths/Protective Factors: Social connections and Sense of purpose  Goals Addressed: Patient will: 1.  Reduce symptoms of: anxiety, depression and stress  2.  Increase knowledge and/or ability of: coping skills, self-management skills and stress reduction  3.   Demonstrate ability to: Increase healthy adjustment to current life circumstances  Progress towards Goals: Other  Interventions: Interventions utilized:  Supportive Counseling was utilized by the clinician during today's follow up session. The clinician processed with the patient regarding how he has been doing since the last follow up session. Clinician measured the patient's anxiety and depression on a numerical scale. Clinician utilized reflective listening encouraging the patient to ventilate his feelings towards his current situation. The clinician offered to add the patient to the [facility] web based case management system, and offered resources referrals. Clinician explained that she would discuss the patient's case with the psychiatric consultant Dr. Emilia Beck, MD on Tuesday June 09, 2020.   Standardized Assessments completed: GAD-7 and PHQ 9  GAD-7  16 PHQ-9 18    Patient may benefit from see above  Plan: 1. Follow up with behavioral health clinician on : 06/16/2020 at 2:00 PM   2. Behavioral recommendations:  3. Referral(s): [facility] (In Clinic) 4. "From scale of 1-10, how likely are you to follow plan?":   Judith Part, Student-Social Work

## 2020-06-09 ENCOUNTER — Other Ambulatory Visit: Payer: Self-pay | Admitting: Gerontology

## 2020-06-09 DIAGNOSIS — F341 Dysthymic disorder: Secondary | ICD-10-CM

## 2020-06-09 MED ORDER — BUSPIRONE HCL 10 MG PO TABS: ORAL_TABLET | ORAL | 0 refills | Status: DC

## 2020-06-12 ENCOUNTER — Encounter (INDEPENDENT_AMBULATORY_CARE_PROVIDER_SITE_OTHER): Payer: Self-pay

## 2020-06-12 ENCOUNTER — Ambulatory Visit (INDEPENDENT_AMBULATORY_CARE_PROVIDER_SITE_OTHER): Payer: Self-pay | Admitting: Nurse Practitioner

## 2020-06-16 ENCOUNTER — Ambulatory Visit: Payer: Self-pay | Admitting: Licensed Clinical Social Worker

## 2020-06-22 ENCOUNTER — Other Ambulatory Visit: Payer: Self-pay | Admitting: Gerontology

## 2020-07-08 ENCOUNTER — Other Ambulatory Visit: Payer: Self-pay

## 2020-07-08 ENCOUNTER — Emergency Department
Admission: EM | Admit: 2020-07-08 | Discharge: 2020-07-08 | Disposition: A | Discharge status: Home / Self Care | Payer: Self-pay | Attending: Emergency Medicine | Admitting: Emergency Medicine

## 2020-07-08 DIAGNOSIS — Z7982 Long term (current) use of aspirin: Secondary | ICD-10-CM | POA: Insufficient documentation

## 2020-07-08 DIAGNOSIS — W260XXA Contact with knife, initial encounter: Secondary | ICD-10-CM | POA: Insufficient documentation

## 2020-07-08 DIAGNOSIS — I10 Essential (primary) hypertension: Secondary | ICD-10-CM | POA: Insufficient documentation

## 2020-07-08 DIAGNOSIS — Y93G1 Activity, food preparation and clean up: Secondary | ICD-10-CM | POA: Insufficient documentation

## 2020-07-08 DIAGNOSIS — Z79899 Other long term (current) drug therapy: Secondary | ICD-10-CM | POA: Insufficient documentation

## 2020-07-08 DIAGNOSIS — Z7984 Long term (current) use of oral hypoglycemic drugs: Secondary | ICD-10-CM | POA: Insufficient documentation

## 2020-07-08 DIAGNOSIS — S61412A Laceration without foreign body of left hand, initial encounter: Secondary | ICD-10-CM | POA: Insufficient documentation

## 2020-07-08 DIAGNOSIS — E1140 Type 2 diabetes mellitus with diabetic neuropathy, unspecified: Secondary | ICD-10-CM | POA: Insufficient documentation

## 2020-07-08 DIAGNOSIS — F17210 Nicotine dependence, cigarettes, uncomplicated: Secondary | ICD-10-CM | POA: Insufficient documentation

## 2020-07-08 DIAGNOSIS — Z23 Encounter for immunization: Secondary | ICD-10-CM | POA: Insufficient documentation

## 2020-07-08 MED ORDER — CEPHALEXIN 500 MG PO CAPS
500.0000 mg | ORAL_CAPSULE | Freq: Once | ORAL | Status: AC
  Administered 2020-07-08: 500 mg via ORAL
  Filled 2020-07-08: qty 1

## 2020-07-08 MED ORDER — TETANUS-DIPHTH-ACELL PERTUSSIS 5-2.5-18.5 LF-MCG/0.5 IM SUSY
0.5000 mL | PREFILLED_SYRINGE | Freq: Once | INTRAMUSCULAR | Status: AC
  Administered 2020-07-08: 0.5 mL via INTRAMUSCULAR
  Filled 2020-07-08: qty 0.5

## 2020-07-08 MED ORDER — CEPHALEXIN 500 MG PO CAPS: 500.0000 mg | ORAL_CAPSULE | Freq: Four times a day (QID) | ORAL | 0 refills | Status: AC

## 2020-07-08 NOTE — ED Provider Notes (Signed)
[facility] Emergency Department Provider Note  ____________________________________________  Time seen: Approximately 5:41 PM  I have reviewed the triage vital signs and the nursing notes.   HISTORY  Chief Complaint Laceration    HPI [name] is a 60 y.o. male that presents to emergency department for evaluation of left hand laceration.  Patient cut himself with a knife 2 days ago.  He was trying to open a package of hamburgers when the knife slipped.  He is able to move all of his fingers normally.  His tetanus shot is not up-to-date.   Past Medical History:  Diagnosis Date  . Arthritis   . Blood clot in vein   . Diabetes mellitus   . DVT (deep venous thrombosis) ([place])   . Hypertension   . Neuropathy     Patient Active Problem List   Diagnosis Date Noted  . Excessive cerumen in ear canal, bilateral 05/05/2020  . History of anxiety 03/11/2020  . Type 2 diabetes mellitus with diabetic neuropathy, unspecified ([place]) 02/18/2020  . Ischemia of lower extremity 01/16/2020  . Needs assistance with community resources 09/11/2019  . Peripheral vascular disease ([place]) 10/23/2018  . Neuropathic pain of ankle, left 01/25/2017  . Adjustment disorder with mixed anxiety and depressed mood 08/27/2016  . MDD (major depressive disorder) 08/10/2016  . Hypertriglyceridemia 06/30/2016  . Thrombocytopenia, unspecified ([place]) 12/06/2012  . Diabetic neuropathy ([place]) 12/05/2012  . Tobacco use disorder 12/05/2012  . Dyslipidemia 12/05/2012  . Controlled type 2 diabetes mellitus without complication ([place]) 35/70/1779  . Essential hypertension 05/11/2011    Past Surgical History:  Procedure Laterality Date  . APPENDECTOMY    . LOWER EXTREMITY ANGIOGRAPHY Right 01/27/2020   Procedure: LOWER EXTREMITY ANGIOGRAPHY;  Surgeon: Algernon Huxley, MD;  Location: [facility];  Service: Cardiovascular;  Laterality: Right;  . VEIN LIGATION AND STRIPPING     of left leg     Prior to Admission medications   Medication Sig Start Date End Date Taking? Authorizing Provider  cephALEXin (KEFLEX) 500 MG capsule Take 1 capsule (500 mg total) by mouth 4 (four) times daily for 10 days. 07/08/20 07/18/20 Yes Laban Emperor, PA-C  acetaminophen (TYLENOL) 325 MG tablet Take 650 mg by mouth every 6 (six) hours as needed.     [provider]  aspirin 81 MG EC tablet TAKE ONE TABLET BY MOUTH EVERY DAY 05/05/20   Kris Hartmann, NP  atorvastatin (LIPITOR) 40 MG tablet Take 1 tablet (40 mg total) by mouth daily. 01/27/20   Algernon Huxley, MD  blood glucose meter kit and supplies KIT Dispense based on patient and insurance preference. Use up to four times daily as directed. (FOR ICD-9 250.00, 250.01). Patient taking differently: Inject 1 each into the skin See admin instructions. Dispense based on patient and insurance preference. Use up to four times daily as directed. (FOR ICD-9 250.00, 250.01). 08/15/19   Iloabachie, Chioma E, NP  busPIRone (BUSPAR) 10 MG tablet TAKE ONE TABLET BY MOUTH 3 TIMES A DAY 06/09/20   Iloabachie, Chioma E, NP  carbamide peroxide (DEBROX) 6.5 % OTIC solution Place 5 drops into both ears 2 (two) times daily. 05/05/20   Iloabachie, Chioma E, NP  clopidogrel (PLAVIX) 75 MG tablet Take 1 tablet (75 mg total) by mouth daily. Patient not taking: Reported on 03/11/2020 01/27/20   Algernon Huxley, MD  famotidine (PEPCID) 20 MG tablet Take 1 tablet (20 mg total) by mouth 2 (two) times daily. 02/29/20  Paulette Blanch, MD  gabapentin (NEURONTIN) 100 MG capsule Take 1 capsule (100 mg total) by mouth at bedtime. Patient not taking: Reported on 03/11/2020 02/18/20   Iloabachie, Chioma E, NP  glimepiride (AMARYL) 4 MG tablet Take 1 tablet (4 mg total) by mouth in the morning and at bedtime. 05/05/20 06/04/20  Iloabachie, Chioma E, NP  glucose blood (TRUE METRIX BLOOD GLUCOSE TEST) test strip Use as instructed 01/31/17   Tresa Garter, MD  metFORMIN (GLUCOPHAGE) 1000 MG  tablet Take 1 tablet (1,000 mg total) by mouth 2 (two) times daily with a meal. 05/05/20 06/04/20  Iloabachie, Chioma E, NP  metoprolol tartrate (LOPRESSOR) 50 MG tablet Take 1 tablet (50 mg total) by mouth 2 (two) times daily. 05/05/20 06/04/20  Iloabachie, Chioma E, NP    Allergies Erythromycin base, Xanax [alprazolam], Benzodiazepines, Methocarbamol, and Sulfa antibiotics  Family History  Problem Relation Age of Onset  . Heart disease Mother   . Diabetes Father   . Heart disease Father     Social History Social History   Tobacco Use  . Smoking status: Current Every Day Smoker    Packs/day: 2.00    Years: 6.00    Pack years: 12.00    Types: Cigarettes  . Smokeless tobacco: Never Used  . Tobacco comment: 1.5-2  Vaping Use  . Vaping Use: Never used  Substance Use Topics  . Alcohol use: No  . Drug use: No     Review of Systems  Cardiovascular: No chest pain. Respiratory:  No SOB. Gastrointestinal: No abdominal pain.  No nausea, no vomiting.  Musculoskeletal: Positive for hand pain. Skin: Negative for rash, abrasions, ecchymosis.  Positive for laceration.   ____________________________________________   PHYSICAL EXAM:  VITAL SIGNS: ED Triage Vitals  Enc Vitals Group     BP 07/08/20 1615 (!) 145/103     Pulse Rate 07/08/20 1615 85     Resp 07/08/20 1615 18     Temp 07/08/20 1615 98.3 F (36.8 C)     Temp Source 07/08/20 1615 Oral     SpO2 07/08/20 1615 99 %     Weight 07/08/20 1614 220 lb (99.8 kg)     Height 07/08/20 1614 _0  (1.803 m)     Head Circumference --      Peak Flow --      Pain Score 07/08/20 1614 6     Pain Loc --      Pain Edu? --      Excl. in [place]? --      Constitutional: Alert and oriented. Well appearing and in no acute distress. Eyes: Conjunctivae are normal. PERRL. EOMI. Head: Atraumatic. ENT:      Ears:      Nose: No congestion/rhinnorhea.      Mouth/Throat: Mucous membranes are moist.  Neck: No stridor. Cardiovascular: Normal  rate, regular rhythm.  Good peripheral circulation. Respiratory: Normal respiratory effort without tachypnea or retractions. Lungs CTAB. Good air entry to the bases with no decreased or absent breath sounds. Gastrointestinal: Bowel sounds 4 quadrants. Soft and nontender to palpation. No guarding or rigidity. No palpable masses.  Musculoskeletal: Full range of motion to all extremities. No gross deformities appreciated.  Full range of motion of left hand. Neurologic:  Normal speech and language. No gross focal neurologic deficits are appreciated.  Skin:  Skin is warm, dry and intact.  1 cm laceration to left palm.  No surrounding erythema.  No drainage. Psychiatric: Mood and affect are normal. Speech and  behavior are normal. Patient exhibits appropriate insight and judgement.   ____________________________________________   LABS (all labs ordered are listed, but only abnormal results are displayed)  Labs Reviewed - No data to display ____________________________________________  EKG   ____________________________________________  RADIOLOGY  No results found.  ____________________________________________    PROCEDURES  Procedure(s) performed:    Procedures    Medications  Tdap (BOOSTRIX) injection 0.5 mL (0.5 mLs Intramuscular Given 07/08/20 1817)  cephALEXin (KEFLEX) capsule 500 mg (500 mg Oral Given 07/08/20 1817)     ____________________________________________   INITIAL IMPRESSION / ASSESSMENT AND PLAN / ED COURSE  Pertinent labs & imaging results that were available during my care of the patient were reviewed by me and considered in my medical decision making (see chart for details).  Review of the [place] CSRS was performed in accordance of the [place] prior to dispensing any controlled drugs.     Patient presented to emergency department for evaluation of left hand laceration 2 days ago.  Vital signs and exam are reassuring.  Laceration is too old for sutures.   Laceration was cleaned and rebandaged in the emergency department.  Tetanus shot was updated.  Patient will be discharged home with prescriptions for Keflex. Patient is to follow up with PCP as directed. Patient is given ED precautions to return to the ED for any worsening or new symptoms.  [name] was evaluated in Emergency Department on 07/08/2020 for the symptoms described in the history of present illness. He was evaluated in the context of the global COVID-19 pandemic, which necessitated consideration that the patient might be at risk for infection with the SARS-CoV-2 virus that causes COVID-19. Institutional protocols and algorithms that pertain to the evaluation of patients at risk for COVID-19 are in a state of rapid change based on information released by regulatory bodies including the CDC and federal and state organizations. These policies and algorithms were followed during the patient's care in the ED.   ____________________________________________  FINAL CLINICAL IMPRESSION(S) / ED DIAGNOSES  Final diagnoses:  Laceration of left hand, foreign body presence unspecified, initial encounter      NEW MEDICATIONS STARTED DURING THIS VISIT:  ED Discharge Orders         Ordered    cephALEXin (KEFLEX) 500 MG capsule  4 times daily        07/08/20 1822              This chart was dictated using voice recognition software/Dragon. Despite best efforts to proofread, errors can occur which can change the meaning. Any change was purely unintentional.    Laban Emperor, PA-C 07/08/20 1904    Nance Pear, MD 07/08/20 1911

## 2020-07-08 NOTE — ED Notes (Signed)
Pt called to be triaged x1.  No response by patient.

## 2020-07-08 NOTE — ED Triage Notes (Signed)
Pt was prepping dinner with pt accidentally sliced hand with knife.

## 2020-07-22 ENCOUNTER — Other Ambulatory Visit: Payer: Self-pay

## 2020-07-28 ENCOUNTER — Telehealth: Payer: Self-pay | Admitting: Licensed Clinical Social Worker

## 2020-07-28 ENCOUNTER — Ambulatory Visit: Payer: Self-pay | Admitting: Licensed Clinical Social Worker

## 2020-07-28 NOTE — Telephone Encounter (Signed)
Called the patient twice for today's appointment. No answer on either attempt.

## 2020-07-29 ENCOUNTER — Other Ambulatory Visit: Payer: Self-pay

## 2020-07-29 ENCOUNTER — Ambulatory Visit: Payer: Self-pay | Admitting: Gerontology

## 2020-08-04 ENCOUNTER — Other Ambulatory Visit: Payer: Self-pay

## 2020-08-04 ENCOUNTER — Ambulatory Visit: Payer: Self-pay | Admitting: Gerontology

## 2020-08-04 VITALS — BP 135/86 | HR 76 | Resp 16 | Wt 195.6 lb

## 2020-08-04 DIAGNOSIS — F17200 Nicotine dependence, unspecified, uncomplicated: Secondary | ICD-10-CM

## 2020-08-04 DIAGNOSIS — E1149 Type 2 diabetes mellitus with other diabetic neurological complication: Secondary | ICD-10-CM

## 2020-08-04 DIAGNOSIS — I10 Essential (primary) hypertension: Secondary | ICD-10-CM

## 2020-08-04 LAB — POCT GLYCOSYLATED HEMOGLOBIN (HGB A1C)
HbA1c POC (<> result, manual entry): 13.5 % (ref 4.0–5.6)
HbA1c, POC (controlled diabetic range): 0 % (ref 0.0–7.0)
HbA1c, POC (prediabetic range): 0 % — AB (ref 5.7–6.4)
Hemoglobin A1C: 13.5 % — AB (ref 4.0–5.6)

## 2020-08-04 MED ORDER — GLIMEPIRIDE 4 MG PO TABS: 4.0000 mg | ORAL_TABLET | Freq: Two times a day (BID) | ORAL | 2 refills | Status: DC

## 2020-08-04 MED ORDER — METFORMIN HCL 1000 MG PO TABS: 1000.0000 mg | ORAL_TABLET | Freq: Two times a day (BID) | ORAL | 2 refills | Status: DC

## 2020-08-04 MED ORDER — METOPROLOL TARTRATE 50 MG PO TABS: 50.0000 mg | ORAL_TABLET | Freq: Two times a day (BID) | ORAL | 2 refills | Status: DC

## 2020-08-04 MED ORDER — BASAGLAR KWIKPEN 100 UNIT/ML ~~LOC~~ SOPN: 10.0000 [IU] | PEN_INJECTOR | Freq: Every day | SUBCUTANEOUS | 3 refills | Status: DC

## 2020-08-04 NOTE — Progress Notes (Signed)
Established Patient Office Visit  Subjective:  Patient ID: [name], male    DOB: 1961-05-25  Age: 60 y.o. MRN: [id number]  CC: No chief complaint on file.   HPI [name] presents for follow up of Type 2 diabetes and medication refill. He is not compliant with his appointment, doesn't adhere to his medication and diet. His HgbA1c done during visit was 13.5%.  He has not checked his blood glucose in many months, and It was 235 mg/dl during his visit. He states that he doesn't know where his glucometer is , and he's semi homeless and worries about food. He was seen at the ED for left hand laceration on 07/08/2020. He received Tdap booster, and Keflex. He denies hypoglycemia, but admits to polyuria and peripheral neuropathy but declines taking gabapentin . Overall, he states that he's doing well, but concerned about his uncontrolled diabetes.  Past Medical History:  Diagnosis Date  . Arthritis   . Blood clot in vein   . Diabetes mellitus   . DVT (deep venous thrombosis) ([place])   . Hypertension   . Neuropathy     Past Surgical History:  Procedure Laterality Date  . APPENDECTOMY    . LOWER EXTREMITY ANGIOGRAPHY Right 01/27/2020   Procedure: LOWER EXTREMITY ANGIOGRAPHY;  Surgeon: Algernon Huxley, MD;  Location: [facility];  Service: Cardiovascular;  Laterality: Right;  . VEIN LIGATION AND STRIPPING     of left leg    Family History  Problem Relation Age of Onset  . Heart disease Mother   . Diabetes Father   . Heart disease Father     Social History   Socioeconomic History  . Marital status: Divorced    Spouse name: Not on file  . Number of children: 0  . Years of education: Not on file  . Highest education level: Not on file  Occupational History  . Not on file  Tobacco Use  . Smoking status: Current Every Day Smoker    Packs/day: 2.00    Years: 6.00    Pack years: 12.00    Types: Cigarettes  . Smokeless tobacco: Never Used  . Tobacco comment: 1.5-2   Vaping Use  . Vaping Use: Never used  Substance and Sexual Activity  . Alcohol use: No  . Drug use: No  . Sexual activity: Not on file  Other Topics Concern  . Not on file  Social History Narrative   Social determinants screening completed 09/19/2019. HS      Patient was given resource for bus passes to link transit and non file tax for information for the stimulus check. [place] [facility] referral made on 5/25 for future lab and follow up appointments at the clinic by scheduling ride coordination through [employer]. HS   Social Determinants of Health   Financial Resource Strain: High Risk  . Difficulty of Paying Living Expenses: Hard  Food Insecurity: No Food Insecurity  . Worried About Charity fundraiser in the Last Year: Never true  . Ran Out of Food in the Last Year: Never true  Transportation Needs: Unmet Transportation Needs  . Lack of Transportation (Medical): Yes  . Lack of Transportation (Non-Medical): Yes  Physical Activity: Insufficiently Active  . Days of Exercise per Week: 3 days  . Minutes of Exercise per Session: 30 min  Stress: Stress Concern Present  . Feeling of Stress : To some extent  Social Connections: Socially Isolated  . Frequency of Communication with Friends and  Family: Never  . Frequency of Social Gatherings with Friends and Family: Never  . Attends Religious Services: Never  . Active Member of Clubs or Organizations: No  . Attends Archivist Meetings: Never  . Marital Status: Divorced  Human resources officer Violence: Not At Risk  . Fear of Current or Ex-Partner: No  . Emotionally Abused: No  . Physically Abused: No  . Sexually Abused: No    Outpatient Medications Prior to Visit  Medication Sig Dispense Refill  . acetaminophen (TYLENOL) 325 MG tablet Take 650 mg by mouth every 6 (six) hours as needed.     Marland Kitchen aspirin 81 MG EC tablet TAKE ONE TABLET BY MOUTH EVERY DAY 90 tablet 0  . atorvastatin (LIPITOR) 40 MG tablet Take 1 tablet (40 mg total) by  mouth daily. 30 tablet 3  . busPIRone (BUSPAR) 10 MG tablet TAKE ONE TABLET BY MOUTH 3 TIMES A DAY 90 tablet 0  . blood glucose meter kit and supplies KIT Dispense based on patient and insurance preference. Use up to four times daily as directed. (FOR ICD-9 250.00, 250.01). (Patient taking differently: Inject 1 each into the skin See admin instructions. Dispense based on patient and insurance preference. Use up to four times daily as directed. (FOR ICD-9 250.00, 250.01).) 1 each 0  . clopidogrel (PLAVIX) 75 MG tablet Take 1 tablet (75 mg total) by mouth daily. (Patient not taking: No sig reported) 30 tablet 11  . famotidine (PEPCID) 20 MG tablet Take 1 tablet (20 mg total) by mouth 2 (two) times daily. (Patient not taking: Reported on 08/04/2020) 8 tablet 0  . glucose blood (TRUE METRIX BLOOD GLUCOSE TEST) test strip Use as instructed 100 each 12  . carbamide peroxide (DEBROX) 6.5 % OTIC solution Place 5 drops into both ears 2 (two) times daily. (Patient not taking: Reported on 08/04/2020) 15 mL 0  . gabapentin (NEURONTIN) 100 MG capsule Take 1 capsule (100 mg total) by mouth at bedtime. (Patient not taking: No sig reported) 30 capsule 0  . glimepiride (AMARYL) 4 MG tablet Take 1 tablet (4 mg total) by mouth in the morning and at bedtime. 60 tablet 2  . metFORMIN (GLUCOPHAGE) 1000 MG tablet Take 1 tablet (1,000 mg total) by mouth 2 (two) times daily with a meal. 60 tablet 2  . metoprolol tartrate (LOPRESSOR) 50 MG tablet Take 1 tablet (50 mg total) by mouth 2 (two) times daily. 60 tablet 2   No facility-administered medications prior to visit.    Allergies  Allergen Reactions  . Erythromycin Base Anxiety  . Xanax [Alprazolam] Other (See Comments)    Tachycardia  . Benzodiazepines     PT STATES HE CANNOT TAKE THESE BECAUSE THEY ALMOST KILLED HIM  . Methocarbamol Anxiety and Other (See Comments)    Stomach cramping, weakness  . Sulfa Antibiotics Nausea And Vomiting and Other (See Comments)     Cramping in stomach and hyperventilate.     ROS Review of Systems  Constitutional: Negative.   Respiratory: Negative.   Cardiovascular: Negative.   Endocrine: Positive for polyuria.  Neurological: Negative.   Psychiatric/Behavioral: Negative.       Objective:    Physical Exam HENT:     Head: Normocephalic and atraumatic.  Eyes:     Extraocular Movements: Extraocular movements intact.     Conjunctiva/sclera: Conjunctivae normal.     Pupils: Pupils are equal, round, and reactive to light.  Cardiovascular:     Rate and Rhythm: Normal rate and regular rhythm.  Pulses: Normal pulses.     Heart sounds: Normal heart sounds.  Pulmonary:     Effort: Pulmonary effort is normal.     Breath sounds: Normal breath sounds.  Skin:    General: Skin is warm and dry.  Neurological:     General: No focal deficit present.     Mental Status: He is alert and oriented to person, place, and time. Mental status is at baseline.     BP 135/86 (BP Location: Right Arm, Patient Position: Sitting, Cuff Size: Large)   Pulse 76   Resp 16   Wt 195 lb 9.6 oz (88.7 kg)   SpO2 98%   BMI 27.28 kg/m  Wt Readings from Last 3 Encounters:  08/04/20 195 lb 9.6 oz (88.7 kg)  07/08/20 220 lb (99.8 kg)  05/05/20 198 lb 12.8 oz (90.2 kg)     Health Maintenance Due  Topic Date Due  . Hepatitis C Screening  Never done  . COVID-19 Vaccine (1) Never done  . COLONOSCOPY (Pts 45-102yr Insurance coverage will need to be confirmed)  Never done  . URINE MICROALBUMIN  09/17/2020    There are no preventive care reminders to display for this patient.  Lab Results  Component Value Date   TSH 2.070 09/18/2019   Lab Results  Component Value Date   WBC 6.7 01/17/2020   HGB 17.0 01/17/2020   HCT 51.2 01/17/2020   MCV 95.2 01/17/2020   PLT 132 (L) 01/17/2020   Lab Results  Component Value Date   NA 140 01/17/2020   K 4.8 01/17/2020   CO2 29 01/17/2020   GLUCOSE 120 (H) 01/17/2020   BUN 8 01/27/2020    CREATININE 0.70 01/27/2020   BILITOT 0.7 01/15/2020   ALKPHOS 64 01/15/2020   AST 15 01/15/2020   ALT 15 01/15/2020   PROT 6.6 01/15/2020   ALBUMIN 4.2 01/15/2020   CALCIUM 9.6 01/17/2020   ANIONGAP 7 01/17/2020   Lab Results  Component Value Date   CHOL 151 04/22/2020   Lab Results  Component Value Date   HDL 32 (L) 04/22/2020   Lab Results  Component Value Date   LDLCALC 93 04/22/2020   Lab Results  Component Value Date   TRIG 147 04/22/2020   Lab Results  Component Value Date   CHOLHDL 4.7 04/22/2020   Lab Results  Component Value Date   HGBA1C 13.5 (A) 08/04/2020   HGBA1C 13.5 08/04/2020   HGBA1C 0 (A) 08/04/2020   HGBA1C 0.0 08/04/2020      Assessment & Plan:   1. Type 2 diabetes mellitus with other neurologic complication, without long-term current use of insulin (HCC) - His HgbA1c was 13.5%, his goal should be less than 7%. He was started on Basaglar 10 units at bedtime. He was advised to check his blood glucose bid, record and bring to next week's appointment. He was reminded that his fasting goal should be between 80-130 mg/dl. Discussed complications of uncontrolled diabetes with patient. He was advised to continue on low carb/ non concentrated sweet diet. - POCT HgB A1C - glimepiride (AMARYL) 4 MG tablet; Take 1 tablet (4 mg total) by mouth in the morning and at bedtime.  Dispense: 60 tablet; Refill: 2 - metFORMIN (GLUCOPHAGE) 1000 MG tablet; Take 1 tablet (1,000 mg total) by mouth 2 (two) times daily with a meal.  Dispense: 60 tablet; Refill: 2 - Insulin Glargine (BASAGLAR KWIKPEN) 100 UNIT/ML; Inject 10 Units into the skin at bedtime.  Dispense: 3 mL; Refill:  3  2. Essential hypertension - His blood pressure is under control, he will continue on current medication and DASH diet. - metoprolol tartrate (LOPRESSOR) 50 MG tablet; Take 1 tablet (50 mg total) by mouth 2 (two) times daily.  Dispense: 60 tablet; Refill: 2  3. Tobacco use disorder - He  smokes 1 pack if cigarette daily and admits the desire to quit. He was provided with [place] Quit line information.     Follow-up: Return in about 9 days (around 08/13/2020), or if symptoms worsen or fail to improve.    Suhayla Chisom Jerold Coombe, NP

## 2020-08-04 NOTE — Patient Instructions (Signed)

## 2020-08-11 ENCOUNTER — Ambulatory Visit: Payer: Self-pay | Admitting: Licensed Clinical Social Worker

## 2020-08-13 ENCOUNTER — Ambulatory Visit: Payer: Self-pay | Admitting: Gerontology

## 2020-08-15 ENCOUNTER — Other Ambulatory Visit: Payer: Self-pay | Admitting: Emergency Medicine

## 2020-08-15 ENCOUNTER — Other Ambulatory Visit: Payer: Self-pay

## 2020-08-15 ENCOUNTER — Encounter: Payer: Self-pay | Admitting: Intensive Care

## 2020-08-15 ENCOUNTER — Emergency Department
Admission: EM | Admit: 2020-08-15 | Discharge: 2020-08-15 | Disposition: A | Discharge status: Home / Self Care | Payer: Self-pay | Attending: Emergency Medicine | Admitting: Emergency Medicine

## 2020-08-15 ENCOUNTER — Emergency Department: Payer: Self-pay

## 2020-08-15 DIAGNOSIS — L03115 Cellulitis of right lower limb: Secondary | ICD-10-CM | POA: Insufficient documentation

## 2020-08-15 DIAGNOSIS — E1165 Type 2 diabetes mellitus with hyperglycemia: Secondary | ICD-10-CM | POA: Insufficient documentation

## 2020-08-15 DIAGNOSIS — Z794 Long term (current) use of insulin: Secondary | ICD-10-CM | POA: Insufficient documentation

## 2020-08-15 DIAGNOSIS — Z79899 Other long term (current) drug therapy: Secondary | ICD-10-CM | POA: Insufficient documentation

## 2020-08-15 DIAGNOSIS — E11628 Type 2 diabetes mellitus with other skin complications: Secondary | ICD-10-CM | POA: Insufficient documentation

## 2020-08-15 DIAGNOSIS — Z7982 Long term (current) use of aspirin: Secondary | ICD-10-CM | POA: Insufficient documentation

## 2020-08-15 DIAGNOSIS — F17210 Nicotine dependence, cigarettes, uncomplicated: Secondary | ICD-10-CM | POA: Insufficient documentation

## 2020-08-15 DIAGNOSIS — Z86718 Personal history of other venous thrombosis and embolism: Secondary | ICD-10-CM | POA: Insufficient documentation

## 2020-08-15 DIAGNOSIS — Z7984 Long term (current) use of oral hypoglycemic drugs: Secondary | ICD-10-CM | POA: Insufficient documentation

## 2020-08-15 DIAGNOSIS — I10 Essential (primary) hypertension: Secondary | ICD-10-CM | POA: Insufficient documentation

## 2020-08-15 DIAGNOSIS — Z7902 Long term (current) use of antithrombotics/antiplatelets: Secondary | ICD-10-CM | POA: Insufficient documentation

## 2020-08-15 DIAGNOSIS — R739 Hyperglycemia, unspecified: Secondary | ICD-10-CM

## 2020-08-15 DIAGNOSIS — E1121 Type 2 diabetes mellitus with diabetic nephropathy: Secondary | ICD-10-CM | POA: Insufficient documentation

## 2020-08-15 LAB — CBC WITH DIFFERENTIAL/PLATELET
Abs Immature Granulocytes: 0.02 10*3/uL (ref 0.00–0.07)
Basophils Absolute: 0.1 10*3/uL (ref 0.0–0.1)
Basophils Relative: 1 %
Eosinophils Absolute: 0.3 10*3/uL (ref 0.0–0.5)
Eosinophils Relative: 4 %
HCT: 47.5 % (ref 39.0–52.0)
Hemoglobin: 17 g/dL (ref 13.0–17.0)
Immature Granulocytes: 0 %
Lymphocytes Relative: 22 %
Lymphs Abs: 1.3 10*3/uL (ref 0.7–4.0)
MCH: 32.8 pg (ref 26.0–34.0)
MCHC: 35.8 g/dL (ref 30.0–36.0)
MCV: 91.7 fL (ref 80.0–100.0)
Monocytes Absolute: 0.4 10*3/uL (ref 0.1–1.0)
Monocytes Relative: 6 %
Neutro Abs: 4 10*3/uL (ref 1.7–7.7)
Neutrophils Relative %: 67 %
Platelets: 123 10*3/uL — ABNORMAL LOW (ref 150–400)
RBC: 5.18 MIL/uL (ref 4.22–5.81)
RDW: 12.6 % (ref 11.5–15.5)
WBC: 6 10*3/uL (ref 4.0–10.5)
nRBC: 0 % (ref 0.0–0.2)

## 2020-08-15 LAB — COMPREHENSIVE METABOLIC PANEL
ALT: 17 U/L (ref 0–44)
AST: 15 U/L (ref 15–41)
Albumin: 4.1 g/dL (ref 3.5–5.0)
Alkaline Phosphatase: 79 U/L (ref 38–126)
Anion gap: 12 (ref 5–15)
BUN: 14 mg/dL (ref 8–23)
CO2: 24 mmol/L (ref 22–32)
Calcium: 9.4 mg/dL (ref 8.9–10.3)
Chloride: 98 mmol/L (ref 98–111)
Creatinine, Ser: 0.87 mg/dL (ref 0.61–1.24)
GFR, Estimated: >60 mL/min (ref >60)
Glucose, Bld: 437 mg/dL — ABNORMAL HIGH (ref 70–99)
Potassium: 4.3 mmol/L (ref 3.5–5.1)
Sodium: 134 mmol/L — ABNORMAL LOW (ref 135–145)
Total Bilirubin: 0.5 mg/dL (ref 0.3–1.2)
Total Protein: 7 g/dL (ref 6.5–8.1)

## 2020-08-15 LAB — URINALYSIS, COMPLETE (UACMP) WITH MICROSCOPIC
Bacteria, UA: NONE SEEN
Bilirubin Urine: NEGATIVE
Glucose, UA: >=500 mg/dL — AB
Ketones, ur: NEGATIVE mg/dL
Leukocytes,Ua: NEGATIVE
Nitrite: NEGATIVE
Protein, ur: NEGATIVE mg/dL
Specific Gravity, Urine: 1.028 (ref 1.005–1.030)
Squamous Epithelial / HPF: NONE SEEN (ref 0–5)
pH: 7 (ref 5.0–8.0)

## 2020-08-15 LAB — CBG MONITORING, ED
Glucose-Capillary: 444 mg/dL — ABNORMAL HIGH (ref 70–99)
Glucose-Capillary: 243 mg/dL — ABNORMAL HIGH (ref 70–99)

## 2020-08-15 LAB — LACTIC ACID, PLASMA
Lactic Acid, Venous: 2.3 mmol/L (ref 0.5–1.9)
Lactic Acid, Venous: 2.1 mmol/L (ref 0.5–1.9)

## 2020-08-15 MED ORDER — GABAPENTIN 300 MG PO CAPS: 300.0000 mg | ORAL_CAPSULE | Freq: Three times a day (TID) | ORAL | 2 refills | Status: DC

## 2020-08-15 MED ORDER — CEPHALEXIN 500 MG PO CAPS: 500.0000 mg | ORAL_CAPSULE | Freq: Four times a day (QID) | ORAL | 0 refills | Status: DC

## 2020-08-15 MED ORDER — CEPHALEXIN 500 MG PO CAPS
500.0000 mg | ORAL_CAPSULE | Freq: Once | ORAL | Status: AC
  Administered 2020-08-15: 500 mg via ORAL
  Filled 2020-08-15: qty 1

## 2020-08-15 MED ORDER — DOXYCYCLINE HYCLATE 100 MG PO TABS
100.0000 mg | ORAL_TABLET | Freq: Once | ORAL | Status: AC
  Administered 2020-08-15: 100 mg via ORAL
  Filled 2020-08-15: qty 1

## 2020-08-15 MED ORDER — OXYCODONE HCL 5 MG PO TABS: 5.0000 mg | ORAL_TABLET | Freq: Three times a day (TID) | ORAL | 0 refills | Status: AC | PRN

## 2020-08-15 MED ORDER — DOXYCYCLINE HYCLATE 100 MG PO TABS: 100.0000 mg | ORAL_TABLET | Freq: Two times a day (BID) | ORAL | 0 refills | Status: AC

## 2020-08-15 MED ORDER — INSULIN ASPART 100 UNIT/ML ~~LOC~~ SOLN
10.0000 [IU] | Freq: Once | SUBCUTANEOUS | Status: AC
  Administered 2020-08-15: 10 [IU] via INTRAVENOUS
  Filled 2020-08-15: qty 1

## 2020-08-15 MED ORDER — LACTATED RINGERS IV BOLUS
1000.0000 mL | Freq: Once | INTRAVENOUS | Status: AC
  Administered 2020-08-15: 1000 mL via INTRAVENOUS

## 2020-08-15 NOTE — ED Provider Notes (Signed)
[facility] Emergency Department Provider Note ____________________________________________   Event Date/Time   First MD Initiated Contact with Patient 08/15/20 1359     (approximate)  I have reviewed the triage vital signs and the nursing notes.  HISTORY  Chief Complaint Foot Pain   HPI [name] is a 60 y.o. malewho presents to the ED for evaluation of right foot pain and swelling.   Chart review indicates history of DM on insulin, HTN.  Noncompliance and recent reestablishment with PCP NP, A1c of 13.5%.  Patient presents to the ED with "few months" of right-sided foot pain.  He reports noting his blood glucose in the 200-300 range this morning, which prompted his visit to the ED today.  He denies any abdominal pain, emesis, fever, chills, chest pain, shortness of breath, cough, ulcers, falls or purulent discharge from his right foot.  He reports 10/10 pain to his right foot reminiscent of his diabetic neuropathy.  He denies any recent antibiotics.   Past Medical History:  Diagnosis Date  . Arthritis   . Blood clot in vein   . Diabetes mellitus   . DVT (deep venous thrombosis) ([place])   . Hypertension   . Neuropathy     Patient Active Problem List   Diagnosis Date Noted  . Excessive cerumen in ear canal, bilateral 05/05/2020  . History of anxiety 03/11/2020  . Type 2 diabetes mellitus with diabetic neuropathy, unspecified ([place]) 02/18/2020  . Ischemia of lower extremity 01/16/2020  . Needs assistance with community resources 09/11/2019  . Peripheral vascular disease ([place]) 10/23/2018  . Neuropathic pain of ankle, left 01/25/2017  . Adjustment disorder with mixed anxiety and depressed mood 08/27/2016  . MDD (major depressive disorder) 08/10/2016  . Hypertriglyceridemia 06/30/2016  . Thrombocytopenia, unspecified ([place]) 12/06/2012  . Diabetic neuropathy ([place]) 12/05/2012  . Tobacco use disorder 12/05/2012  . Dyslipidemia 12/05/2012  .  Controlled type 2 diabetes mellitus without complication ([place]) 16/04/9603  . Essential hypertension 05/11/2011    Past Surgical History:  Procedure Laterality Date  . APPENDECTOMY    . LOWER EXTREMITY ANGIOGRAPHY Right 01/27/2020   Procedure: LOWER EXTREMITY ANGIOGRAPHY;  Surgeon: Algernon Huxley, MD;  Location: [facility];  Service: Cardiovascular;  Laterality: Right;  . VEIN LIGATION AND STRIPPING     of left leg    Prior to Admission medications   Medication Sig Start Date End Date Taking? Authorizing Provider  cephALEXin (KEFLEX) 500 MG capsule Take 1 capsule (500 mg total) by mouth 4 (four) times daily for 10 days. 08/15/20 08/25/20 Yes Vladimir Crofts, MD  doxycycline (VIBRA-TABS) 100 MG tablet Take 1 tablet (100 mg total) by mouth 2 (two) times daily for 10 days. 08/15/20 08/25/20 Yes Vladimir Crofts, MD  acetaminophen (TYLENOL) 325 MG tablet Take 650 mg by mouth every 6 (six) hours as needed.     [provider]  aspirin 81 MG EC tablet TAKE ONE TABLET BY MOUTH EVERY DAY 05/05/20   Kris Hartmann, NP  atorvastatin (LIPITOR) 40 MG tablet Take 1 tablet (40 mg total) by mouth daily. 01/27/20   Algernon Huxley, MD  blood glucose meter kit and supplies KIT Dispense based on patient and insurance preference. Use up to four times daily as directed. (FOR ICD-9 250.00, 250.01). Patient taking differently: Inject 1 each into the skin See admin instructions. Dispense based on patient and insurance preference. Use up to four times daily as directed. (FOR ICD-9 250.00, 250.01). 08/15/19   Iloabachie,  Chioma E, NP  busPIRone (BUSPAR) 10 MG tablet TAKE ONE TABLET BY MOUTH 3 TIMES A DAY 06/09/20   Iloabachie, Chioma E, NP  clopidogrel (PLAVIX) 75 MG tablet Take 1 tablet (75 mg total) by mouth daily. Patient not taking: No sig reported 01/27/20   Algernon Huxley, MD  famotidine (PEPCID) 20 MG tablet Take 1 tablet (20 mg total) by mouth 2 (two) times daily. Patient not taking: Reported on 08/04/2020 02/29/20    Paulette Blanch, MD  glimepiride (AMARYL) 4 MG tablet Take 1 tablet (4 mg total) by mouth in the morning and at bedtime. 08/04/20 09/03/20  Iloabachie, Chioma E, NP  glucose blood (TRUE METRIX BLOOD GLUCOSE TEST) test strip Use as instructed 01/31/17   Tresa Garter, MD  Insulin Glargine (BASAGLAR KWIKPEN) 100 UNIT/ML Inject 10 Units into the skin at bedtime. 08/04/20   Iloabachie, Chioma E, NP  metFORMIN (GLUCOPHAGE) 1000 MG tablet Take 1 tablet (1,000 mg total) by mouth 2 (two) times daily with a meal. 08/04/20 09/03/20  Iloabachie, Chioma E, NP  metoprolol tartrate (LOPRESSOR) 50 MG tablet Take 1 tablet (50 mg total) by mouth 2 (two) times daily. 08/04/20 09/03/20  Iloabachie, Chioma E, NP    Allergies Erythromycin base, Xanax [alprazolam], Benzodiazepines, Methocarbamol, and Sulfa antibiotics  Family History  Problem Relation Age of Onset  . Heart disease Mother   . Diabetes Father   . Heart disease Father     Social History Social History   Tobacco Use  . Smoking status: Current Every Day Smoker    Packs/day: 2.00    Years: 6.00    Pack years: 12.00    Types: Cigarettes  . Smokeless tobacco: Never Used  . Tobacco comment: 1.5-2  Vaping Use  . Vaping Use: Never used  Substance Use Topics  . Alcohol use: Yes    Alcohol/week: 4.0 standard drinks    Types: 4 Cans of beer per week  . Drug use: No    Review of Systems  Constitutional: No fever/chills Eyes: No visual changes. ENT: No sore throat. Cardiovascular: Denies chest pain. Respiratory: Denies shortness of breath. Gastrointestinal: No abdominal pain.  No nausea, no vomiting.  No diarrhea.  No constipation. Genitourinary: Negative for dysuria. Musculoskeletal: Negative for back pain.  Positive for right foot.. Skin: Negative for rash. Neurological: Negative for headaches, focal weakness or numbness. ____________________________________________   PHYSICAL EXAM:  VITAL SIGNS: Vitals:   08/15/20 1430 08/15/20 1500   BP: 138/80 (!) 141/77  Pulse: 86 73  Resp:  16  Temp:    SpO2: 100% 96%    Constitutional: Alert and oriented. Well appearing and in no acute distress. Eyes: Conjunctivae are normal. PERRL. EOMI. Head: Atraumatic. Nose: No congestion/rhinnorhea. Mouth/Throat: Mucous membranes are dry.  Oropharynx non-erythematous. Neck: No stridor. No cervical spine tenderness to palpation. Cardiovascular: Normal rate, regular rhythm. Grossly normal heart sounds.  Good peripheral circulation. Respiratory: Normal respiratory effort.  No retractions. Lungs CTAB. Gastrointestinal: Soft , nondistended, nontender to palpation. No CVA tenderness. Musculoskeletal: Right foot does not appear swollen compared to the left, but is more erythematous with more shiny skin. No ulcerative lesions throughout the right foot, between the toes or to the plantar surface. Palpable DP pulse. Neurologic:  Normal speech and language. No gross focal neurologic deficits are appreciated. No gait instability noted. Skin:  Skin is warm, dry and intact. No rash noted. Psychiatric: Mood and affect are normal. Speech and behavior are normal.     ____________________________________________  LABS (all labs ordered are listed, but only abnormal results are displayed)  Labs Reviewed  LACTIC ACID, PLASMA - Abnormal; Notable for the following components:      Result Value   Lactic Acid, Venous 2.3 (*)    All other components within normal limits  COMPREHENSIVE METABOLIC PANEL - Abnormal; Notable for the following components:   Sodium 134 (*)    Glucose, Bld 437 (*)    All other components within normal limits  CBC WITH DIFFERENTIAL/PLATELET - Abnormal; Notable for the following components:   Platelets 123 (*)    All other components within normal limits  URINALYSIS, COMPLETE (UACMP) WITH MICROSCOPIC - Abnormal; Notable for the following components:   Color, Urine STRAW (*)    APPearance CLEAR (*)    Glucose, UA >=500 (*)     Hgb urine dipstick SMALL (*)    All other components within normal limits  CBG MONITORING, ED - Abnormal; Notable for the following components:   Glucose-Capillary 444 (*)    All other components within normal limits  LACTIC ACID, PLASMA    ____________________________________________  RADIOLOGY  ED MD interpretation: Plain film of the right foot reviewed by me without evidence of acute bony injury or bony erosion  Official radiology report(s): DG Foot Complete Right  Result Date: 08/15/2020 CLINICAL DATA:  Chronic right foot pain and swelling. History of diabetes. EXAM: RIGHT FOOT COMPLETE - 3+ VIEW COMPARISON:  None. FINDINGS: No acute fracture or dislocation. Joint spaces are preserved. Small lateral first MTP joint marginal osteophytes. Joint spaces are preserved. Bone mineralization is normal. Large plantar enthesophyte. Soft tissues are unremarkable. IMPRESSION: 1.  No acute osseous abnormality. 2. Mild first MTP joint osteoarthritis. Electronically Signed   By: Titus Dubin M.D.   On: 08/15/2020 13:17    ____________________________________________   PROCEDURES and INTERVENTIONS  Procedure(s) performed (including Critical Care):  .1-3 Lead EKG Interpretation Performed by: Vladimir Crofts, MD Authorized by: Vladimir Crofts, MD     Interpretation: normal     ECG rate:  70   ECG rate assessment: normal     Rhythm: sinus rhythm     Ectopy: none     Conduction: normal      Medications  lactated ringers bolus 1,000 mL (0 mLs Intravenous Stopped 08/15/20 1410)  cephALEXin (KEFLEX) capsule 500 mg (500 mg Oral Given 08/15/20 1509)  doxycycline (VIBRA-TABS) tablet 100 mg (100 mg Oral Given 08/15/20 1509)  insulin aspart (novoLOG) injection 10 Units (10 Units Intravenous Given 08/15/20 1509)  lactated ringers bolus 1,000 mL (1,000 mLs Intravenous New Bag/Given 08/15/20 1509)    ____________________________________________   MDM / ED COURSE  60 year old male with poorly  controlled diabetes presents to the ED with evidence of diabetic foot infection, likely amenable to outpatient management. Normal vitals on room air. Exam generally reassuring with an erythematous right foot, but without evidence of purulent ulcers, abscess or significant neurovascular deficits. He overall looks well clinically. Blood work without leukocytosis, and demonstrates mild lactic acidosis. He is hyperglycemic without acidosis or signs of DKA or HHS. We will fluid rehydrate, provide IV insulin and oral antibiotics for the planned of likely outpatient management of his diabetic foot infection. Patient signed out to oncoming physician during this resuscitation.     ____________________________________________   FINAL CLINICAL IMPRESSION(S) / ED DIAGNOSES  Final diagnoses:  Hyperglycemia  Diabetic foot infection [facility])     ED Discharge Orders         Ordered    doxycycline (VIBRA-TABS)  100 MG tablet  2 times daily        08/15/20 1537    cephALEXin (KEFLEX) 500 MG capsule  4 times daily        08/15/20 1537           Dylan Tamala Julian   Note:  This document was prepared using Systems analyst and may include unintentional dictation errors.   Vladimir Crofts, MD 08/15/20 1538

## 2020-08-15 NOTE — ED Provider Notes (Addendum)
5:00 PM Assumed care for off going team.   Blood pressure 131/72, pulse 68, temperature 98.4 F (36.9 C), temperature source Oral, resp. rate 16, height 5\' 11"  (1.803 m), weight 88.5 kg, SpO2 97 %.  See their HPI for full report but in brief plan is for patient to get insulin, fluids and oral antibiotics and discharge home.  Reevaluated patient.  He is doing well.  He is requesting something for his bilateral foot pain.  Patient describes it as a tingling sensation in both of his feet.  Suspect that the neuropathy.  We will start a low-dose of gabapentin discussed with patient he should follow-up for this.  Will provide a few oxycodone for breakthrough pain until you get follow-up with his primary care doctor discussed with patient using oxycodone and then using the gabapentin afterwards but not using together.  No driving while on oxycodone  Patient received the above medications.  His repeat lactate is downtrending.  Still slightly elevated but he does not meet any sepsis or sirs criteria have low suspicion for severe illness at this time.  He is very well-appearing with normal vital signs otherwise.  His glucose is also downtrending.  Will discharge patient per initial doctor's plan.  See their note for more details       , MD 08/15/20 1707    10/13/20, MD 08/15/20 [phone]

## 2020-08-15 NOTE — ED Triage Notes (Signed)
Patient presents with right foot pain/swelling/discolaration. Hx diabetes. Reports he does not control diabetes well. Foot has been causing him issues 6 months plus but has not been seen by anyone due to no insurance. Would like to speak with social work to find available resources to help him.

## 2020-08-15 NOTE — Discharge Instructions (Addendum)
As we discussed, you're being discharged with prescriptions for two different antibiotics. Keflex to take four times daily for the next 10 days Doxycycline to take two times daily for the next 10 days  Take the gabapentin to help with neuropathy pain.  Take the oxycodone for breakthrough pain to help with the pain secondary to the infection.  Continue all of your insulin and other prescription medications. Follow-up with your PCP in the next 1 week.  Return to the ED with any worsening symptoms despite these medications.

## 2020-08-25 ENCOUNTER — Ambulatory Visit: Payer: Self-pay | Admitting: Licensed Clinical Social Worker

## 2020-08-25 ENCOUNTER — Telehealth: Payer: Self-pay | Admitting: Licensed Clinical Social Worker

## 2020-08-25 NOTE — Telephone Encounter (Signed)
Called the patient during today's scheduled appointment time. The patient did not answer, left voicemail with clinic's contact information.

## 2020-08-26 ENCOUNTER — Ambulatory Visit: Payer: Self-pay | Admitting: Gerontology

## 2020-09-10 ENCOUNTER — Ambulatory Visit: Payer: Self-pay | Admitting: Gerontology

## 2020-09-15 ENCOUNTER — Encounter: Payer: Self-pay | Admitting: Emergency Medicine

## 2020-09-15 ENCOUNTER — Other Ambulatory Visit: Payer: Self-pay

## 2020-09-15 ENCOUNTER — Emergency Department: Payer: Self-pay

## 2020-09-15 ENCOUNTER — Emergency Department
Admission: EM | Admit: 2020-09-15 | Discharge: 2020-09-15 | Discharge status: Against Medical Advice | Payer: Self-pay | Attending: Emergency Medicine | Admitting: Emergency Medicine

## 2020-09-15 DIAGNOSIS — Z794 Long term (current) use of insulin: Secondary | ICD-10-CM | POA: Insufficient documentation

## 2020-09-15 DIAGNOSIS — I739 Peripheral vascular disease, unspecified: Secondary | ICD-10-CM

## 2020-09-15 DIAGNOSIS — Z86718 Personal history of other venous thrombosis and embolism: Secondary | ICD-10-CM | POA: Insufficient documentation

## 2020-09-15 DIAGNOSIS — Z79899 Other long term (current) drug therapy: Secondary | ICD-10-CM | POA: Insufficient documentation

## 2020-09-15 DIAGNOSIS — G629 Polyneuropathy, unspecified: Secondary | ICD-10-CM

## 2020-09-15 DIAGNOSIS — I10 Essential (primary) hypertension: Secondary | ICD-10-CM | POA: Insufficient documentation

## 2020-09-15 DIAGNOSIS — F17210 Nicotine dependence, cigarettes, uncomplicated: Secondary | ICD-10-CM | POA: Insufficient documentation

## 2020-09-15 DIAGNOSIS — E1151 Type 2 diabetes mellitus with diabetic peripheral angiopathy without gangrene: Secondary | ICD-10-CM | POA: Insufficient documentation

## 2020-09-15 DIAGNOSIS — Z7984 Long term (current) use of oral hypoglycemic drugs: Secondary | ICD-10-CM | POA: Insufficient documentation

## 2020-09-15 DIAGNOSIS — E1140 Type 2 diabetes mellitus with diabetic neuropathy, unspecified: Secondary | ICD-10-CM | POA: Insufficient documentation

## 2020-09-15 DIAGNOSIS — E1165 Type 2 diabetes mellitus with hyperglycemia: Secondary | ICD-10-CM | POA: Insufficient documentation

## 2020-09-15 DIAGNOSIS — M79671 Pain in right foot: Secondary | ICD-10-CM

## 2020-09-15 LAB — BASIC METABOLIC PANEL
Anion gap: 9 (ref 5–15)
BUN: 13 mg/dL (ref 8–23)
CO2: 26 mmol/L (ref 22–32)
Calcium: 9.7 mg/dL (ref 8.9–10.3)
Chloride: 99 mmol/L (ref 98–111)
Creatinine, Ser: 1 mg/dL (ref 0.61–1.24)
GFR, Estimated: >60 mL/min (ref >60)
Glucose, Bld: 383 mg/dL — ABNORMAL HIGH (ref 70–99)
Potassium: 4.4 mmol/L (ref 3.5–5.1)
Sodium: 134 mmol/L — ABNORMAL LOW (ref 135–145)

## 2020-09-15 LAB — CBG MONITORING, ED
Glucose-Capillary: 370 mg/dL — ABNORMAL HIGH (ref 70–99)
Glucose-Capillary: 267 mg/dL — ABNORMAL HIGH (ref 70–99)

## 2020-09-15 LAB — CBC
HCT: 51.2 % (ref 39.0–52.0)
Hemoglobin: 18.4 g/dL — ABNORMAL HIGH (ref 13.0–17.0)
MCH: 32.9 pg (ref 26.0–34.0)
MCHC: 35.9 g/dL (ref 30.0–36.0)
MCV: 91.4 fL (ref 80.0–100.0)
Platelets: 143 10*3/uL — ABNORMAL LOW (ref 150–400)
RBC: 5.6 MIL/uL (ref 4.22–5.81)
RDW: 12.3 % (ref 11.5–15.5)
WBC: 7.4 10*3/uL (ref 4.0–10.5)
nRBC: 0 % (ref 0.0–0.2)

## 2020-09-15 LAB — URINALYSIS, COMPLETE (UACMP) WITH MICROSCOPIC
Bacteria, UA: NONE SEEN
Bilirubin Urine: NEGATIVE
Glucose, UA: >=500 mg/dL — AB
Ketones, ur: NEGATIVE mg/dL
Leukocytes,Ua: NEGATIVE
Nitrite: NEGATIVE
Protein, ur: NEGATIVE mg/dL
Specific Gravity, Urine: 1.017 (ref 1.005–1.030)
Squamous Epithelial / HPF: NONE SEEN (ref 0–5)
pH: 5 (ref 5.0–8.0)

## 2020-09-15 MED ORDER — MORPHINE SULFATE (PF) 2 MG/ML IV SOLN
2.0000 mg | Freq: Once | INTRAVENOUS | Status: AC
  Administered 2020-09-15: 2 mg via INTRAVENOUS
  Filled 2020-09-15: qty 1

## 2020-09-15 MED ORDER — SODIUM CHLORIDE 0.9 % IV BOLUS
1000.0000 mL | Freq: Once | INTRAVENOUS | Status: AC
  Administered 2020-09-15: 1000 mL via INTRAVENOUS

## 2020-09-15 MED ORDER — INSULIN ASPART 100 UNIT/ML ~~LOC~~ SOLN
8.0000 [IU] | Freq: Once | SUBCUTANEOUS | Status: AC
  Administered 2020-09-15: 8 [IU] via SUBCUTANEOUS
  Filled 2020-09-15: qty 1

## 2020-09-15 NOTE — ED Triage Notes (Signed)
Patient presents to the ED with a foot ulcer to his right foot.  Patient had surgery to foot last year.  Patient states, "they told me I might need to get my foot taken off".  Patient states, "I take care of myself and no one takes care of me, no one can give me rides."  Patient states he does not have medicaid, medicare or any kind of transportation or disability.  Patient appears very stressed.  Patient states he increased his insulin and it has not been able to control his blood sugar.  Patient states, "I'm not feeling good."

## 2020-09-15 NOTE — ED Provider Notes (Signed)
[facility] Emergency Department Provider Note ____________________________________________   Event Date/Time   First MD Initiated Contact with Patient 09/15/20 1643     (approximate)  I have reviewed the triage vital signs and the nursing notes.   HISTORY  Chief Complaint Hyperglycemia    HPI [name] is a 60 y.o. male with PMH as noted below including DM type II on insulin who presents primarily with right foot pain, gradual onset over the last several months and worse in the last several weeks.  The patient states that he has a wound on his right heel which has not been healing and has caused increased pain.  He describes the pain as burning.  He also reports increased blood sugars in the 200s and 300s recently.  The patient is on long-acting insulin which she takes twice daily.  He used to be on Metformin.  He states that he has taken gabapentin occasionally in the past for pain but does not like the side effects.  Patient reports that he has ongoing social and transportation problems.  He does not have Medicare or Medicaid.  He has applied for disability without success.  He does not have a car and has had to walk long distances to run errands.  He has not been able to arrange transportation to get to medical appointments.  Past Medical History:  Diagnosis Date  . Arthritis   . Blood clot in vein   . Diabetes mellitus   . DVT (deep venous thrombosis) ([place])   . Hypertension   . Neuropathy     Patient Active Problem List   Diagnosis Date Noted  . Excessive cerumen in ear canal, bilateral 05/05/2020  . History of anxiety 03/11/2020  . Type 2 diabetes mellitus with diabetic neuropathy, unspecified ([place]) 02/18/2020  . Ischemia of lower extremity 01/16/2020  . Needs assistance with community resources 09/11/2019  . Peripheral vascular disease ([place]) 10/23/2018  . Neuropathic pain of ankle, left 01/25/2017  . Adjustment disorder with mixed anxiety  and depressed mood 08/27/2016  . MDD (major depressive disorder) 08/10/2016  . Hypertriglyceridemia 06/30/2016  . Thrombocytopenia, unspecified ([place]) 12/06/2012  . Diabetic neuropathy ([place]) 12/05/2012  . Tobacco use disorder 12/05/2012  . Dyslipidemia 12/05/2012  . Controlled type 2 diabetes mellitus without complication ([place]) [id number]/2841  . Essential hypertension 05/11/2011    Past Surgical History:  Procedure Laterality Date  . APPENDECTOMY    . LOWER EXTREMITY ANGIOGRAPHY Right 01/27/2020   Procedure: LOWER EXTREMITY ANGIOGRAPHY;  Surgeon: Algernon Huxley, MD;  Location: [place] [facility];  Service: Cardiovascular;  Laterality: Right;  . VEIN LIGATION AND STRIPPING     of left leg    Prior to Admission medications   Medication Sig Start Date End Date Taking? Authorizing Provider  gabapentin (NEURONTIN) 300 MG capsule Take 1 capsule (300 mg total) by mouth 3 (three) times daily. 08/15/20 08/15/21 Yes Vanessa Stonybrook, MD  glimepiride (AMARYL) 4 MG tablet Take 1 tablet (4 mg total) by mouth in the morning and at bedtime. 08/04/20 09/03/20 Yes Iloabachie, Chioma E, NP  Insulin Glargine (BASAGLAR KWIKPEN) 100 UNIT/ML Inject 10 Units into the skin at bedtime. 08/04/20  Yes Iloabachie, Chioma E, NP  metFORMIN (GLUCOPHAGE) 1000 MG tablet Take 1 tablet (1,000 mg total) by mouth 2 (two) times daily with a meal. 08/04/20 09/03/20 Yes Iloabachie, Chioma E, NP  metoprolol tartrate (LOPRESSOR) 50 MG tablet Take 1 tablet (50 mg total) by mouth 2 (two) times daily.  08/04/20 09/03/20 Yes Iloabachie, Chioma E, NP  acetaminophen (TYLENOL) 325 MG tablet Take 650 mg by mouth every 6 (six) hours as needed.     [provider]  aspirin 81 MG EC tablet TAKE ONE TABLET BY MOUTH EVERY DAY Patient not taking: Reported on 09/15/2020 05/05/20   Kris Hartmann, NP  atorvastatin (LIPITOR) 40 MG tablet Take 1 tablet (40 mg total) by mouth daily. Patient not taking: Reported on 09/15/2020 01/27/20   Algernon Huxley, MD  blood  glucose meter kit and supplies KIT Dispense based on patient and insurance preference. Use up to four times daily as directed. (FOR ICD-9 250.00, 250.01). Patient not taking: Reported on 09/15/2020 08/15/19   Iloabachie, Chioma E, NP  busPIRone (BUSPAR) 10 MG tablet TAKE ONE TABLET BY MOUTH 3 TIMES A DAY Patient not taking: Reported on 09/15/2020 06/09/20   Iloabachie, Chioma E, NP  clopidogrel (PLAVIX) 75 MG tablet Take 1 tablet (75 mg total) by mouth daily. Patient not taking: No sig reported 01/27/20   Algernon Huxley, MD  famotidine (PEPCID) 20 MG tablet Take 1 tablet (20 mg total) by mouth 2 (two) times daily. Patient not taking: No sig reported 02/29/20   Paulette Blanch, MD  glucose blood (TRUE METRIX BLOOD GLUCOSE TEST) test strip Use as instructed Patient not taking: Reported on 09/15/2020 01/31/17   Tresa Garter, MD    Allergies Erythromycin base, Xanax [alprazolam], Benzodiazepines, Methocarbamol, and Sulfa antibiotics  Family History  Problem Relation Age of Onset  . Heart disease Mother   . Diabetes Father   . Heart disease Father     Social History Social History   Tobacco Use  . Smoking status: Current Every Day Smoker    Packs/day: 2.00    Years: 6.00    Pack years: 12.00    Types: Cigarettes  . Smokeless tobacco: Never Used  . Tobacco comment: 1.5-2  Vaping Use  . Vaping Use: Never used  Substance Use Topics  . Alcohol use: Yes    Alcohol/week: 4.0 standard drinks    Types: 4 Cans of beer per week  . Drug use: No    Review of Systems  Constitutional: No fever. Eyes: No redness. ENT: No sore throat. Cardiovascular: Denies chest pain. Respiratory: Denies shortness of breath. Gastrointestinal: No vomiting. Genitourinary: Negative for dysuria.  Musculoskeletal: Negative for back pain.  Positive for right foot pain. Skin: Negative for rash. Neurological: Positive for tingling and numbness.   ____________________________________________   PHYSICAL  EXAM:  VITAL SIGNS: ED Triage Vitals  Enc Vitals Group     BP 09/15/20 1512 139/89     Pulse Rate 09/15/20 1512 82     Resp 09/15/20 1512 16     Temp 09/15/20 1512 98.3 F (36.8 C)     Temp Source 09/15/20 1512 Oral     SpO2 09/15/20 1512 96 %     Weight 09/15/20 1513 190 lb (86.2 kg)     Height 09/15/20 1513 '5\' 11"'  (1.803 m)     Head Circumference --      Peak Flow --      Pain Score 09/15/20 1512 10     Pain Loc --      Pain Edu? --      Excl. in [place]? --     Constitutional: Alert and oriented. Well appearing and in no acute distress. Eyes: Conjunctivae are normal.  Head: Atraumatic. Nose: No congestion/rhinnorhea. Mouth/Throat: Mucous membranes are moist.   Neck:  Normal range of motion.  Cardiovascular: Normal rate, regular rhythm.  Good peripheral circulation. Respiratory: Normal respiratory effort.  No retractions.  Gastrointestinal:  No distention.  Musculoskeletal: No lower extremity edema.  Extremities warm and well perfused.  Right foot with faint erythema.  No induration or abnormal warmth.  Normal temperature.  Absent pulses bilaterally.  Cap refill approx 2 seconds in R foot, under 2 seconds in left.  Superficial cracked skin to right heel with no surrounding erythema, induration, or any drainage.  No other ulcers or wounds. Neurologic:  Normal speech and language.  Motor and sensory intact in all extremities. Skin:  Skin is warm and dry. No rash noted. Psychiatric: Mood and affect are normal. Speech and behavior are normal.  ____________________________________________   LABS (all labs ordered are listed, but only abnormal results are displayed)  Labs Reviewed  BASIC METABOLIC PANEL - Abnormal; Notable for the following components:      Result Value   Sodium 134 (*)    Glucose, Bld 383 (*)    All other components within normal limits  CBC - Abnormal; Notable for the following components:   Hemoglobin 18.4 (*)    Platelets 143 (*)    All other components  within normal limits  URINALYSIS, COMPLETE (UACMP) WITH MICROSCOPIC - Abnormal; Notable for the following components:   Color, Urine STRAW (*)    APPearance CLEAR (*)    Glucose, UA >=500 (*)    Hgb urine dipstick SMALL (*)    All other components within normal limits  CBG MONITORING, ED - Abnormal; Notable for the following components:   Glucose-Capillary 370 (*)    All other components within normal limits  CBG MONITORING, ED - Abnormal; Notable for the following components:   Glucose-Capillary 267 (*)    All other components within normal limits   ____________________________________________  EKG   ____________________________________________  RADIOLOGY  XR R foot: No acute fracture or evidence of osteomyelitis  ____________________________________________   PROCEDURES  Procedure(s) performed: No  Procedures  Critical Care performed: No ____________________________________________   INITIAL IMPRESSION / ASSESSMENT AND PLAN / ED COURSE  Pertinent labs & imaging results that were available during my care of the patient were reviewed by me and considered in my medical decision making (see chart for details).  60 year old male with PMH as noted above presents primarily with chronic worsening right foot pain and poorly controlled blood glucose.  He reports various social issues which have been preventing him from getting medication and going to appointments.  I reviewed the past medical records in [place].  The patient was last seen in the ED a month ago with the same complaint.  He was treated for possible cellulitis and discharged home.  He was also evaluated last July for primarily right foot pain and found to have a low ABI.  He subsequently underwent percutaneous balloon angioplasty of the right lower extremity veins with stent placement.  On exam today, the patient is overall well-appearing.  His vital signs are normal.  The right foot appears slightly erythematous  with shiny skin but no swelling, induration, or abnormal warmth.  This is similar to the exam described from the previous visit.  Cap refill somewhat sluggish bilaterally and pulses are absent bilaterally.  The patient is able to move the foot.  There is an area of cracked dry skin in the heel but no open wounds or ulcers.  Differential includes primarily diabetic neuropathy versus pain related to worsening peripheral vascular disease.  All of the symptoms are subacute to chronic and I do not suspect any acute thrombus or other vascular occlusion however, if this is due to a vascular etiology of the patient's right lower extremity may be at risk.  We will give analgesia, obtain an x-ray to evaluate for osteomyelitis, and give fluids and insulin for the patient's hyperglycemia.  The patient reports a host of ongoing social issues which are preventing him from being compliant with care.  He states that he lives with a roommate who is frequently not able to function on her own and is dependent on him.  However, he does not have a car, has no money, and has to walk everywhere.  He has had difficulty with transportation to medical appointments and getting medications.  ----------------------------------------- 9:43 PM on 09/15/2020 -----------------------------------------  X-ray and additional lab work-up are unremarkable.  There is no evidence of DKA.  The hyperglycemia is improved.  I discussed the case with Dr. Delana Meyer from vascular surgery who recommended that based on the patient's symptoms and my exam, he would recommend admission for a heparin drip, diabetes control, and likely repeat angiography and possible intervention tomorrow.  However, when I offered this to the patient, he declined.  He states that due to his roommate's current condition and his inability to have anyone to care for him after any type of procedure, he cannot be admitted at this time.  He states that if he comes home after a  procedure on his leg and cannot immediately walk, this will possibly put his roommate at risk and will get evicted.  I had an extensive discussion with the patient about the possible risks.  I explained that the peripheral vascular disease could cause complications including but not limited to loss of the foot, infection/sepsis, permanent disability, and death.  He was able to paraphrase these risks back to me.  He demonstrates appropriate decision-making capacity.  I strongly encourage the patient to stay, but he declines further care at this time and wishes to leave the ED.  I have recommended that he increase the dose of Lantus to 12 units although he also understands that we recommend following up with his PMD for further diabetes counseling and likely increasing of his medication regimen.  Return precautions provided, and he expresses understanding.  The patient also understands he may return at any time if he changes his mind or when he is able to make appropriate arrangements for admission.  ____________________________________________   FINAL CLINICAL IMPRESSION(S) / ED DIAGNOSES  Final diagnoses:  Right foot pain  Peripheral vascular disease ([place])  Neuropathy      NEW MEDICATIONS STARTED DURING THIS VISIT:  Discharge Medication List as of 09/15/2020  9:43 PM       Note:  This document was prepared using Dragon voice recognition software and may include unintentional dictation errors.    Arta Silence, MD 09/15/20 2314

## 2020-09-15 NOTE — ED Notes (Signed)
Pt given meal tray at this time 

## 2020-09-15 NOTE — ED Notes (Signed)
Patient asking if any changes were needed to daily insulin. RN clarified with ED provider prior to patient leaving. Per MD, take 12 units of Lantus instead of 10. Patient aware and verbalizes understanding.

## 2020-09-15 NOTE — ED Notes (Signed)
Pt to ED for intolerable chronic neuropathic pain in both feet. Describes pain as higher than 10/10 and is burning, shooting. Says has been harder to walk due to pain.

## 2020-09-15 NOTE — Discharge Instructions (Signed)
Return to the ER as soon as you are able to so that you can be reevaluated and potentially get a procedure to open up the circulation on your right leg.  We are concerned you could permanently lose circulation to the right foot and need an amputation.  You should also return for any new or worsening pain, weakness or numbness, redness or change in color, or any other symptoms that concern you.  You may return at anytime if you change your mind and wish to resume your care here.

## 2020-09-15 NOTE — ED Notes (Signed)
Propped legs up with pillow per pt request.

## 2020-09-16 IMAGING — CR CERVICAL SPINE - 2-3 VIEW
1 series · 5 of 5 positions shown · non-contrast
Comparison: None.

CLINICAL DATA: Neck pain after fall 1 month prior.

EXAM:
CERVICAL SPINE - 2-3 VIEW

[Series 1: dg cervical spine 2 or 3 views · 0.14mm/px · 5 of 5 slices shown]
[im 1/5]
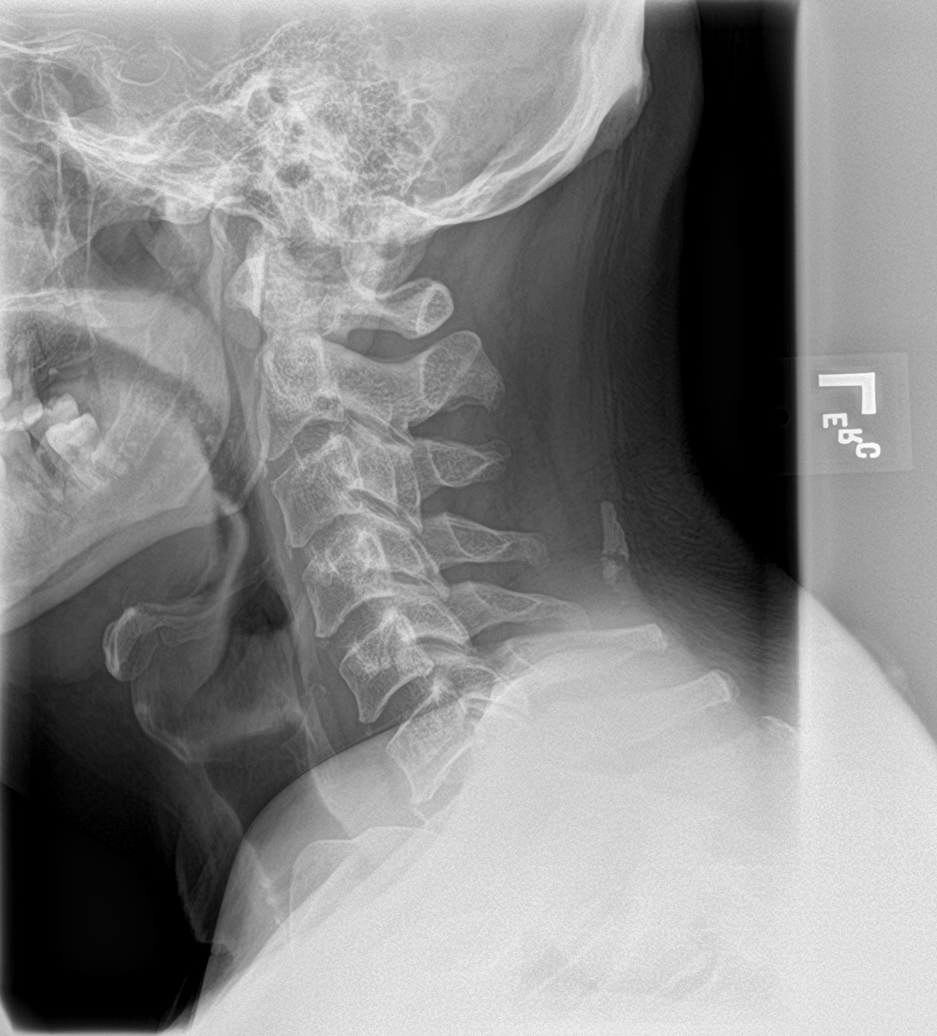
[im 2/5]
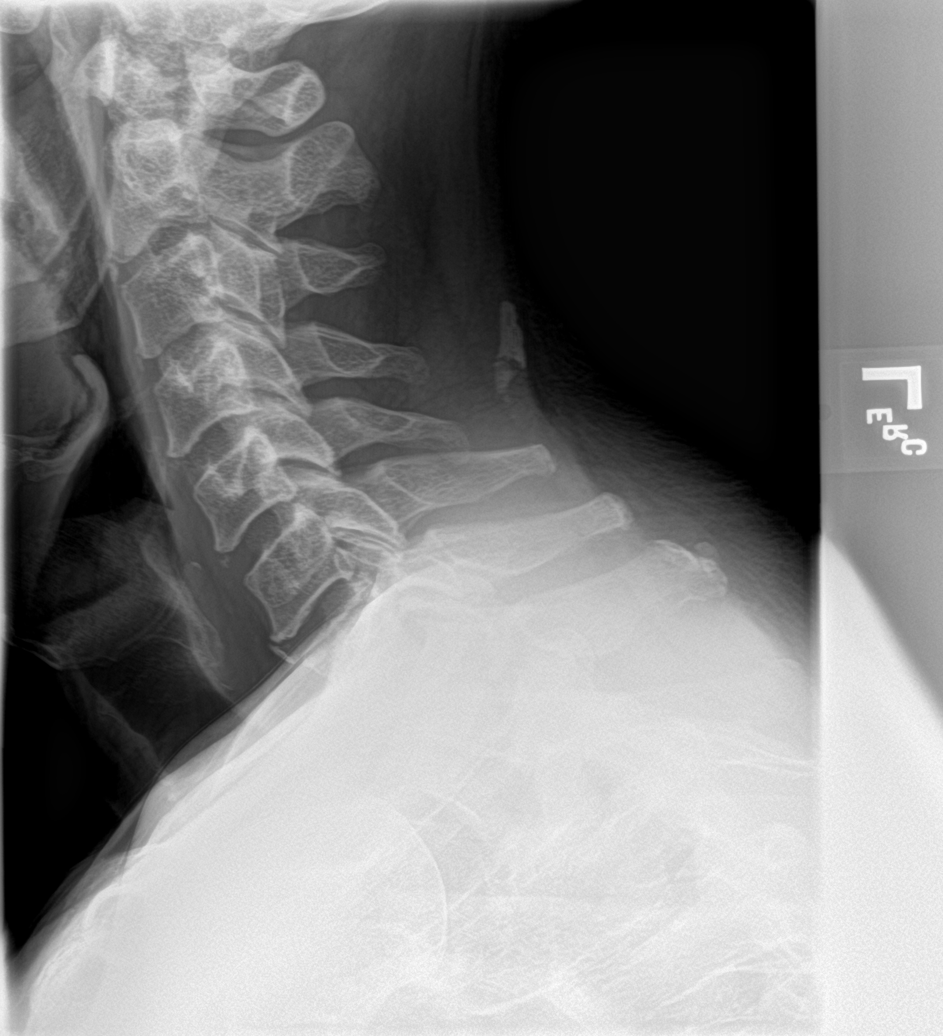
[im 3/5]
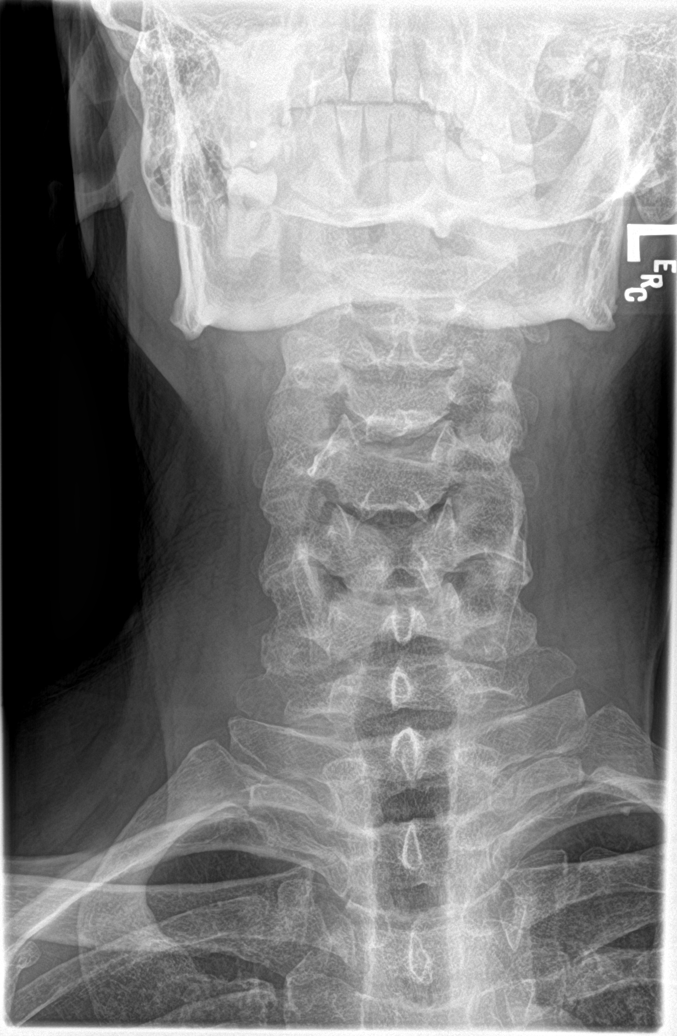
[im 4/5]
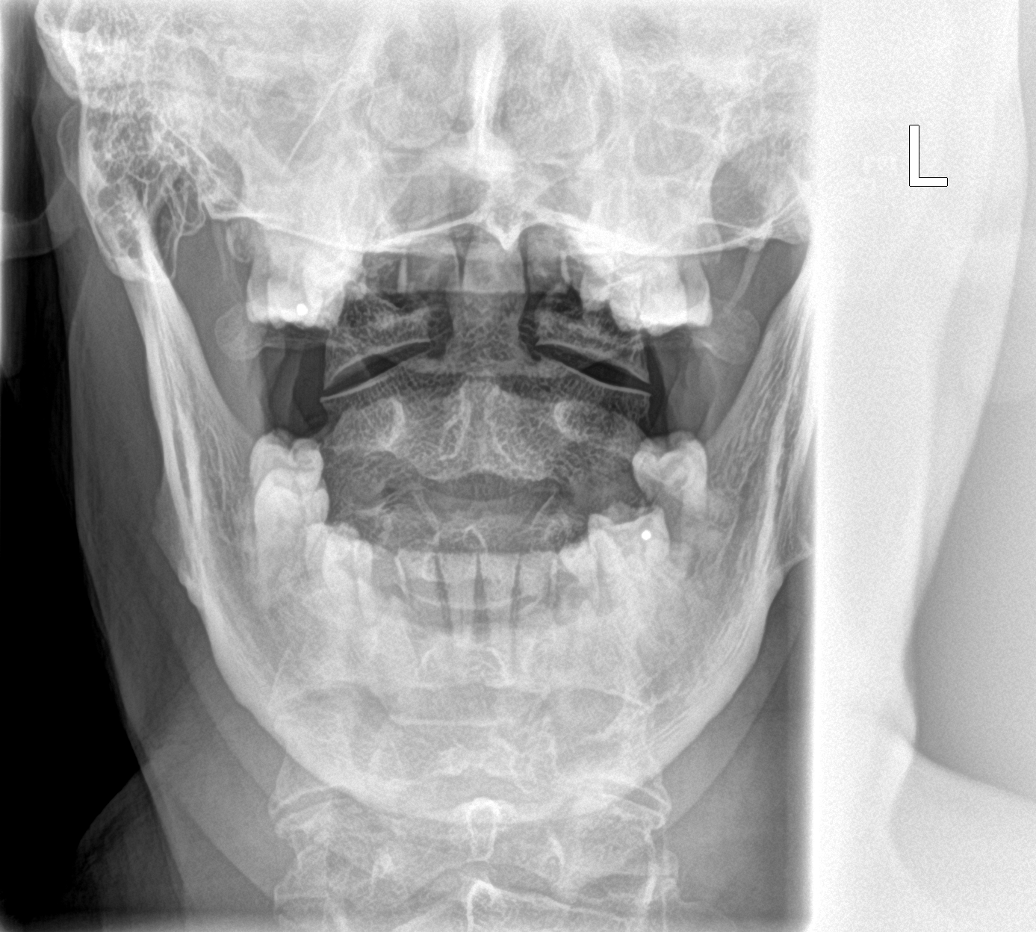
[im 5/5]
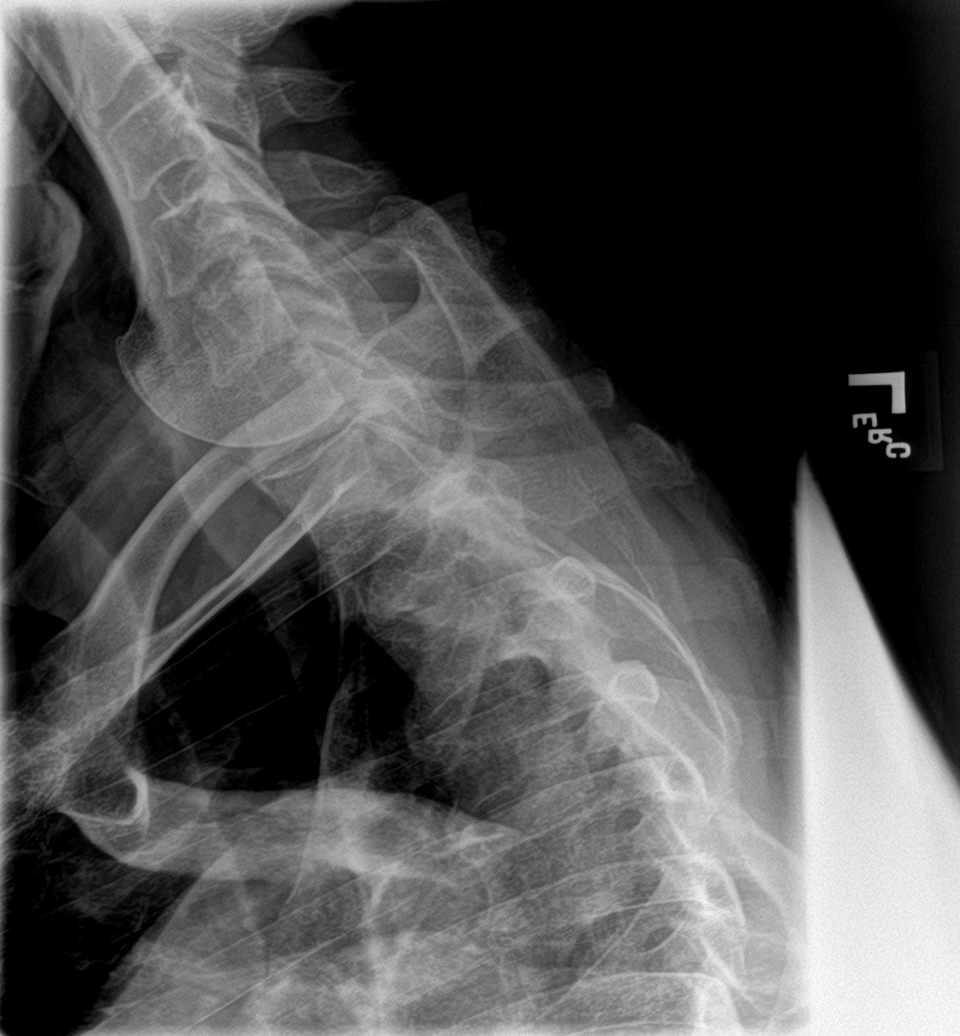

[5 of 5 positions shown; findings below may reference images not displayed]

FINDINGS: On the lateral view the cervical spine is visualized to the level of
C6-7 with improved visualization of the C7-T1 level on swimmer's
view. There is a normal cervical lordosis. Pre-vertebral soft
tissues are within normal limits. No fracture is detected in the
cervical spine. Dens is well positioned between the lateral masses
of C1. Mild multilevel cervical degenerative disc disease, most
prominent at C6-7. No subluxation. Minimal bilateral facet
arthropathy. No aggressive-appearing focal osseous lesions. Focal
ossification of the nuchal ligament in posterior neck soft tissues
at the C5 level.
IMPRESSION: 1. No cervical spine fracture or subluxation.
2. Mild multilevel degenerative disc disease, most prominent at
C6-7.

## 2020-09-21 ENCOUNTER — Telehealth: Payer: Self-pay | Admitting: Pharmacy Technician

## 2020-09-21 NOTE — Telephone Encounter (Signed)
Patient failed to provide 2022 proof of income.  No additional medication assistance will be provided by [facility] without the required proof of income documentation.  Patient notified by letter.  Sherilyn Dacosta Care Manager [facility]   [address] [place], [place]  [address]    September 21, 2020    [name] [address] [place], [place]  [address]  Dear [name]:  This is to inform you that you are no longer eligible to receive medication assistance at [facility].  The reason(s) are:    _____Your total gross monthly household income exceeds 250% of the Federal Poverty Level.   _____Tangible assets (savings, checking, stocks/bonds, pension, retirement, etc.) exceeds our limit  _____You are eligible to receive benefits from [facility], [facility] or HIV Medication              Assistance Program _____You are eligible to receive benefits from a Medicare Part "D" plan _____You have prescription insurance  _____You are not an [facility] resident __X__Failure to provide all requested proof of income information for 2022.    Medication assistance will resume once all requested financial information has been returned to our clinic.  If you have questions, please contact our clinic at [phone].    Thank you,  [facility]

## 2020-09-24 ENCOUNTER — Ambulatory Visit: Payer: Self-pay | Admitting: Gerontology

## 2020-09-29 ENCOUNTER — Encounter: Payer: Self-pay | Admitting: Emergency Medicine

## 2020-09-29 ENCOUNTER — Observation Stay
Admission: EM | Admit: 2020-09-29 | Discharge: 2020-09-30 | Disposition: A | Discharge status: Home / Self Care | Payer: Self-pay | Attending: Hospitalist | Admitting: Hospitalist

## 2020-09-29 ENCOUNTER — Other Ambulatory Visit: Payer: Self-pay

## 2020-09-29 DIAGNOSIS — Z86718 Personal history of other venous thrombosis and embolism: Secondary | ICD-10-CM | POA: Insufficient documentation

## 2020-09-29 DIAGNOSIS — Z5329 Procedure and treatment not carried out because of patient's decision for other reasons: Secondary | ICD-10-CM | POA: Insufficient documentation

## 2020-09-29 DIAGNOSIS — Z794 Long term (current) use of insulin: Secondary | ICD-10-CM | POA: Insufficient documentation

## 2020-09-29 DIAGNOSIS — E1140 Type 2 diabetes mellitus with diabetic neuropathy, unspecified: Secondary | ICD-10-CM | POA: Insufficient documentation

## 2020-09-29 DIAGNOSIS — I739 Peripheral vascular disease, unspecified: Secondary | ICD-10-CM | POA: Diagnosis present

## 2020-09-29 DIAGNOSIS — Z8249 Family history of ischemic heart disease and other diseases of the circulatory system: Secondary | ICD-10-CM | POA: Insufficient documentation

## 2020-09-29 DIAGNOSIS — Z882 Allergy status to sulfonamides status: Secondary | ICD-10-CM | POA: Insufficient documentation

## 2020-09-29 DIAGNOSIS — E1165 Type 2 diabetes mellitus with hyperglycemia: Secondary | ICD-10-CM | POA: Insufficient documentation

## 2020-09-29 DIAGNOSIS — I70235 Atherosclerosis of native arteries of right leg with ulceration of other part of foot: Secondary | ICD-10-CM | POA: Insufficient documentation

## 2020-09-29 DIAGNOSIS — Z881 Allergy status to other antibiotic agents status: Secondary | ICD-10-CM | POA: Insufficient documentation

## 2020-09-29 DIAGNOSIS — E11621 Type 2 diabetes mellitus with foot ulcer: Secondary | ICD-10-CM | POA: Insufficient documentation

## 2020-09-29 DIAGNOSIS — Z20822 Contact with and (suspected) exposure to covid-19: Secondary | ICD-10-CM | POA: Insufficient documentation

## 2020-09-29 DIAGNOSIS — I10 Essential (primary) hypertension: Secondary | ICD-10-CM | POA: Insufficient documentation

## 2020-09-29 DIAGNOSIS — F4323 Adjustment disorder with mixed anxiety and depressed mood: Secondary | ICD-10-CM | POA: Diagnosis present

## 2020-09-29 DIAGNOSIS — L97909 Non-pressure chronic ulcer of unspecified part of unspecified lower leg with unspecified severity: Secondary | ICD-10-CM

## 2020-09-29 DIAGNOSIS — L97519 Non-pressure chronic ulcer of other part of right foot with unspecified severity: Secondary | ICD-10-CM | POA: Insufficient documentation

## 2020-09-29 DIAGNOSIS — Z888 Allergy status to other drugs, medicaments and biological substances status: Secondary | ICD-10-CM | POA: Insufficient documentation

## 2020-09-29 DIAGNOSIS — F17210 Nicotine dependence, cigarettes, uncomplicated: Secondary | ICD-10-CM | POA: Insufficient documentation

## 2020-09-29 DIAGNOSIS — Z7982 Long term (current) use of aspirin: Secondary | ICD-10-CM | POA: Insufficient documentation

## 2020-09-29 DIAGNOSIS — Z7984 Long term (current) use of oral hypoglycemic drugs: Secondary | ICD-10-CM | POA: Insufficient documentation

## 2020-09-29 DIAGNOSIS — Z79899 Other long term (current) drug therapy: Secondary | ICD-10-CM | POA: Insufficient documentation

## 2020-09-29 DIAGNOSIS — Z7902 Long term (current) use of antithrombotics/antiplatelets: Secondary | ICD-10-CM | POA: Insufficient documentation

## 2020-09-29 DIAGNOSIS — Z789 Other specified health status: Secondary | ICD-10-CM | POA: Diagnosis present

## 2020-09-29 DIAGNOSIS — E1151 Type 2 diabetes mellitus with diabetic peripheral angiopathy without gangrene: Principal | ICD-10-CM | POA: Insufficient documentation

## 2020-09-29 DIAGNOSIS — I998 Other disorder of circulatory system: Secondary | ICD-10-CM | POA: Diagnosis present

## 2020-09-29 DIAGNOSIS — Z833 Family history of diabetes mellitus: Secondary | ICD-10-CM | POA: Insufficient documentation

## 2020-09-29 DIAGNOSIS — E785 Hyperlipidemia, unspecified: Secondary | ICD-10-CM | POA: Diagnosis present

## 2020-09-29 LAB — CBC WITH DIFFERENTIAL/PLATELET
Abs Immature Granulocytes: 0.03 10*3/uL (ref 0.00–0.07)
Basophils Absolute: 0.1 10*3/uL (ref 0.0–0.1)
Basophils Relative: 1 %
Eosinophils Absolute: 0.4 10*3/uL (ref 0.0–0.5)
Eosinophils Relative: 5 %
HCT: 50.1 % (ref 39.0–52.0)
Hemoglobin: 17.5 g/dL — ABNORMAL HIGH (ref 13.0–17.0)
Immature Granulocytes: 0 %
Lymphocytes Relative: 17 %
Lymphs Abs: 1.4 10*3/uL (ref 0.7–4.0)
MCH: 32.6 pg (ref 26.0–34.0)
MCHC: 34.9 g/dL (ref 30.0–36.0)
MCV: 93.5 fL (ref 80.0–100.0)
Monocytes Absolute: 0.8 10*3/uL (ref 0.1–1.0)
Monocytes Relative: 9 %
Neutro Abs: 5.5 10*3/uL (ref 1.7–7.7)
Neutrophils Relative %: 68 %
Platelets: 126 10*3/uL — ABNORMAL LOW (ref 150–400)
RBC: 5.36 MIL/uL (ref 4.22–5.81)
RDW: 12.1 % (ref 11.5–15.5)
WBC: 8.2 10*3/uL (ref 4.0–10.5)
nRBC: 0 % (ref 0.0–0.2)

## 2020-09-29 LAB — APTT: aPTT: 28 seconds (ref 24–36)

## 2020-09-29 LAB — COMPREHENSIVE METABOLIC PANEL
ALT: 15 U/L (ref 0–44)
AST: 11 U/L — ABNORMAL LOW (ref 15–41)
Albumin: 4 g/dL (ref 3.5–5.0)
Alkaline Phosphatase: 94 U/L (ref 38–126)
Anion gap: 8 (ref 5–15)
BUN: 17 mg/dL (ref 8–23)
CO2: 25 mmol/L (ref 22–32)
Calcium: 8.9 mg/dL (ref 8.9–10.3)
Chloride: 98 mmol/L (ref 98–111)
Creatinine, Ser: 0.87 mg/dL (ref 0.61–1.24)
GFR, Estimated: >60 mL/min (ref >60)
Glucose, Bld: 527 mg/dL (ref 70–99)
Potassium: 4.4 mmol/L (ref 3.5–5.1)
Sodium: 131 mmol/L — ABNORMAL LOW (ref 135–145)
Total Bilirubin: 0.8 mg/dL (ref 0.3–1.2)
Total Protein: 6.7 g/dL (ref 6.5–8.1)

## 2020-09-29 LAB — PROTIME-INR
INR: 1 (ref 0.8–1.2)
Prothrombin Time: 12.9 seconds (ref 11.4–15.2)

## 2020-09-29 LAB — LACTIC ACID, PLASMA
Lactic Acid, Venous: 1.5 mmol/L (ref 0.5–1.9)
Lactic Acid, Venous: 1.5 mmol/L (ref 0.5–1.9)

## 2020-09-29 LAB — RESP PANEL BY RT-PCR (FLU A&B, COVID) ARPGX2
Influenza A by PCR: NEGATIVE
Influenza B by PCR: NEGATIVE
SARS Coronavirus 2 by RT PCR: NEGATIVE

## 2020-09-29 LAB — URIC ACID: Uric Acid, Serum: 4 mg/dL (ref 3.7–8.6)

## 2020-09-29 LAB — CBG MONITORING, ED: Glucose-Capillary: 363 mg/dL — ABNORMAL HIGH (ref 70–99)

## 2020-09-29 MED ORDER — METOPROLOL TARTRATE 50 MG PO TABS
50.0000 mg | ORAL_TABLET | Freq: Two times a day (BID) | ORAL | Status: DC
  Administered 2020-09-30: 50 mg via ORAL
  Filled 2020-09-29: qty 1

## 2020-09-29 MED ORDER — HEPARIN (PORCINE) 25000 UT/250ML-% IV SOLN
1950.0000 [IU]/h | INTRAVENOUS | Status: DC
  Administered 2020-09-29: 17:00:00 1200 [IU]/h via INTRAVENOUS
  Administered 2020-09-30: 1600 [IU]/h via INTRAVENOUS
  Filled 2020-09-29 (×2): qty 250

## 2020-09-29 MED ORDER — SODIUM CHLORIDE 0.9 % IV BOLUS
1000.0000 mL | Freq: Once | INTRAVENOUS | Status: AC
  Administered 2020-09-29: 1000 mL via INTRAVENOUS

## 2020-09-29 MED ORDER — ACETAMINOPHEN 650 MG RE SUPP: 650.0000 mg | Freq: Four times a day (QID) | RECTAL | Status: DC | PRN

## 2020-09-29 MED ORDER — MORPHINE SULFATE (PF) 4 MG/ML IV SOLN: 4.0000 mg | INTRAVENOUS | Status: DC | PRN

## 2020-09-29 MED ORDER — INSULIN GLARGINE 100 UNIT/ML ~~LOC~~ SOLN
10.0000 [IU] | Freq: Every day | SUBCUTANEOUS | Status: DC
  Administered 2020-09-30: 10 [IU] via SUBCUTANEOUS
  Filled 2020-09-29 (×2): qty 0.1

## 2020-09-29 MED ORDER — ACETAMINOPHEN 325 MG PO TABS: 650.0000 mg | ORAL_TABLET | Freq: Four times a day (QID) | ORAL | Status: DC | PRN

## 2020-09-29 MED ORDER — HEPARIN BOLUS VIA INFUSION
4000.0000 [IU] | Freq: Once | INTRAVENOUS | Status: AC
  Administered 2020-09-29: 4000 [IU] via INTRAVENOUS
  Filled 2020-09-29: qty 4000

## 2020-09-29 MED ORDER — SODIUM CHLORIDE 0.9% FLUSH
3.0000 mL | Freq: Two times a day (BID) | INTRAVENOUS | Status: DC
  Administered 2020-09-30 (×2): 3 mL via INTRAVENOUS

## 2020-09-29 MED ORDER — INSULIN ASPART 100 UNIT/ML ~~LOC~~ SOLN
0.0000 [IU] | Freq: Every day | SUBCUTANEOUS | Status: DC
  Administered 2020-09-30: 4 [IU] via SUBCUTANEOUS
  Filled 2020-09-29: qty 1

## 2020-09-29 MED ORDER — KETOROLAC TROMETHAMINE 30 MG/ML IJ SOLN
30.0000 mg | Freq: Once | INTRAMUSCULAR | Status: AC
  Administered 2020-09-29: 30 mg via INTRAVENOUS
  Filled 2020-09-29: qty 1

## 2020-09-29 MED ORDER — INSULIN ASPART 100 UNIT/ML ~~LOC~~ SOLN
0.0000 [IU] | Freq: Three times a day (TID) | SUBCUTANEOUS | Status: DC
  Administered 2020-09-30 (×2): 3 [IU] via SUBCUTANEOUS
  Filled 2020-09-29 (×3): qty 1

## 2020-09-29 MED ORDER — BUSPIRONE HCL 10 MG PO TABS
10.0000 mg | ORAL_TABLET | Freq: Two times a day (BID) | ORAL | Status: DC
  Filled 2020-09-29 (×2): qty 1

## 2020-09-29 NOTE — Progress Notes (Signed)
ANTICOAGULATION CONSULT NOTE  Pharmacy Consult for heparin Indication: peripheral vascular disease  Allergies  Allergen Reactions  . Erythromycin Base Anxiety  . Xanax [Alprazolam] Other (See Comments)    Tachycardia  . Benzodiazepines     PT STATES HE CANNOT TAKE THESE BECAUSE THEY ALMOST KILLED HIM  . Methocarbamol Anxiety and Other (See Comments)    Stomach cramping, weakness  . Sulfa Antibiotics Nausea And Vomiting and Other (See Comments)    Cramping in stomach and hyperventilate.     Patient Measurements: Height: 5\' 11"  (180.3 cm) Weight: 93 kg (205 lb) IBW/kg (Calculated) : 75.3 Heparin Dosing Weight: 93 kg  Vital Signs: Temp: 98.2 F (36.8 C) (03/29 1447) Temp Source: Oral (03/29 1447) BP: 135/89 (03/29 1603) Pulse Rate: 90 (03/29 1603)  Labs: Recent Labs    09/29/20 1455  HGB 17.5*  HCT 50.1  PLT 126*  CREATININE 0.87    Estimated Creatinine Clearance: 103.9 mL/min (by C-G formula based on SCr of 0.87 mg/dL).   Medical History: Past Medical History:  Diagnosis Date  . Arthritis   . Blood clot in vein   . Diabetes mellitus   . DVT (deep venous thrombosis) ([facility])   . Hypertension   . Neuropathy      Assessment: 60 year old male presented with right foot pain. No anticoagulation PTA. Pharmacy consult for heparin drip.  Goal of Therapy:  Heparin level 0.3-0.7 units/ml Monitor platelets by anticoagulation protocol: Yes   Plan:  Heparin 4000 unit bolus followed by infusion at 1200 units/hr. Check HL 3/30 at 0000. CBC daily while on heparin drip.  4/30, PharmD 09/29/2020,4:39 PM

## 2020-09-29 NOTE — ED Triage Notes (Signed)
Patient presents to the ED with right foot pain.  Patient has been to the ED for the same problem in the past.  Patient states, "I know I have to have surgery, but last time I couldn't stay because I didn't have anybody to help me or take care of me afterward."  Patient states after the end of April he will not have a place to live.  Patient is complaining of severe foot pain.  Patient had vascular surgery in the past and states it did not work correctly.

## 2020-09-29 NOTE — H&P (Signed)
History and Physical   TETSUO COPPOLA ENI:[id number] DOB: 1960/12/16 DOA: 09/29/2020  PCP: Langston Reusing, NP   Patient coming from: Home  Chief Complaint: Right foot pain  HPI: [name] is a 60 y.o. male with medical history significant of adjustment disorder, depression, diabetes, hypertension, hyperlipidemia, peripheral vascular disease, thrombocytopenia who presents with ongoing right foot pain.  Patient has history of PVD and was recently seen in the ED with recommendation for repeat angiogram and possible intervention on 3/15.  However he declined at that time due to inability to have someone help care for him after any procedures and due to needing to help care for roommate.  He presents today with ongoing right foot pain.  Described as an 8-10 out of 10 achy/throbbing pain that radiates up his right leg.  Pain worse with walking or palpation.  As above he has seen vascular surgery before he is had an intervention before per his report but did not fully resolve the issue.  He denies fevers, chills, chest pain, shortness of breath, abdominal pain, constipation, diarrhea, nausea, vomiting.  ED Course: Vital signs stable in ED.  Lab work-up showed CMP with sodium of 131 which corrects considering glucose of 527.  CBC with hemoglobin 17.5 and platelets 126 which is stable.  PT, PTT, INR within normal limits.  Lactic acid normal x2.  Uric acid normal.  Respiratory panel for flu and COVID negative.  Vascular surgery was consulted and will see the patient tomorrow morning plan for angiography and possible intervention as recommended before.  Review of Systems: As per HPI otherwise all other systems reviewed and are negative.  Past Medical History:  Diagnosis Date  . Arthritis   . Blood clot in vein   . Diabetes mellitus   . DVT (deep venous thrombosis) ([place])   . Hypertension   . Neuropathy   . Type 2 diabetes mellitus with diabetic neuropathy, unspecified ([place]) 02/18/2020     Past Surgical History:  Procedure Laterality Date  . APPENDECTOMY    . LOWER EXTREMITY ANGIOGRAPHY Right 01/27/2020   Procedure: LOWER EXTREMITY ANGIOGRAPHY;  Surgeon: Algernon Huxley, MD;  Location: [facility];  Service: Cardiovascular;  Laterality: Right;  . VEIN LIGATION AND STRIPPING     of left leg    Social History  reports that he has been smoking cigarettes. He has a 12.00 pack-year smoking history. He has never used smokeless tobacco. He reports current alcohol use of about 4.0 standard drinks of alcohol per week. He reports that he does not use drugs.  Allergies  Allergen Reactions  . Erythromycin Base Anxiety  . Xanax [Alprazolam] Other (See Comments)    Tachycardia  . Benzodiazepines     PT STATES HE CANNOT TAKE THESE BECAUSE THEY ALMOST KILLED HIM  . Methocarbamol Anxiety and Other (See Comments)    Stomach cramping, weakness  . Sulfa Antibiotics Nausea And Vomiting and Other (See Comments)    Cramping in stomach and hyperventilate.     Family History  Problem Relation Age of Onset  . Heart disease Mother   . Diabetes Father   . Heart disease Father   Reviewed on admission  Prior to Admission medications   Medication Sig Start Date End Date Taking? Authorizing Provider  acetaminophen (TYLENOL) 325 MG tablet Take 650 mg by mouth every 6 (six) hours as needed.    Yes [provider]  Insulin Glargine (BASAGLAR KWIKPEN) 100 UNIT/ML Inject 10 Units into the skin at  bedtime. 08/04/20  Yes Iloabachie, Chioma E, NP  aspirin 81 MG EC tablet TAKE ONE TABLET BY MOUTH EVERY DAY Patient not taking: Reported on 09/15/2020 05/05/20   Richard Hartmann, NP  atorvastatin (LIPITOR) 40 MG tablet Take 1 tablet (40 mg total) by mouth daily. Patient not taking: Reported on 09/15/2020 01/27/20   Algernon Huxley, MD  blood glucose meter kit and supplies KIT Dispense based on patient and insurance preference. Use up to four times daily as directed. (FOR ICD-9 250.00,  250.01). Patient not taking: Reported on 09/15/2020 08/15/19   Iloabachie, Chioma E, NP  busPIRone (BUSPAR) 10 MG tablet TAKE ONE TABLET BY MOUTH 3 TIMES A DAY Patient not taking: Reported on 09/15/2020 06/09/20   Iloabachie, Chioma E, NP  clopidogrel (PLAVIX) 75 MG tablet Take 1 tablet (75 mg total) by mouth daily. Patient not taking: [name] sig reported 01/27/20   Algernon Huxley, MD  famotidine (PEPCID) 20 MG tablet Take 1 tablet (20 mg total) by mouth 2 (two) times daily. Patient not taking: [name] sig reported 02/29/20   Paulette Blanch, MD  gabapentin (NEURONTIN) 300 MG capsule Take 1 capsule (300 mg total) by mouth 3 (three) times daily. 08/15/20 08/15/21  Vanessa [place], MD  glimepiride (AMARYL) 4 MG tablet Take 1 tablet (4 mg total) by mouth in the morning and at bedtime. 08/04/20 09/03/20  Iloabachie, Chioma E, NP  glucose blood (TRUE METRIX BLOOD GLUCOSE TEST) test strip Use as instructed Patient not taking: Reported on 09/15/2020 01/31/17   Tresa Garter, MD  metFORMIN (GLUCOPHAGE) 1000 MG tablet Take 1 tablet (1,000 mg total) by mouth 2 (two) times daily with a meal. 08/04/20 09/03/20  Iloabachie, Chioma E, NP  metoprolol tartrate (LOPRESSOR) 50 MG tablet Take 1 tablet (50 mg total) by mouth 2 (two) times daily. 08/04/20 09/03/20  Langston Reusing, NP    Physical Exam: Vitals:   09/29/20 1701 09/29/20 2007 09/29/20 2202 09/29/20 2309  BP: 133/87 130/74 (!) 141/79 (!) 149/102  Pulse: 81 71 64 84  Resp: _0 Temp:      TempSrc:      SpO2: 96% 94% 94% 96%  Weight:      Height:       Physical Exam Constitutional:      General: He is not in acute distress.    Appearance: Normal appearance.  HENT:     Head: Normocephalic and atraumatic.     Mouth/Throat:     Mouth: Mucous membranes are moist.     Pharynx: Oropharynx is clear.  Eyes:     Extraocular Movements: Extraocular movements intact.     Pupils: Pupils are equal, round, and reactive to light.  Cardiovascular:     Rate and Rhythm:  Normal rate and regular rhythm.     Pulses: Normal pulses.     Heart sounds: Normal heart sounds.  Pulmonary:     Effort: Pulmonary effort is normal. [name] respiratory distress.     Breath sounds: Normal breath sounds.  Abdominal:     General: Bowel sounds are normal. There is [name] distension.     Palpations: Abdomen is soft.     Tenderness: There is [name] abdominal tenderness.  Musculoskeletal:        General: [name] swelling or deformity.     Comments: R medial foot and leg with erythema (and spread to 1st, 2nd, and 3rd digits R medial foot tender to palpation Ulcer forming on R heal See  attached images  Skin:    General: Skin is warm and dry.  Neurological:     General: [name] focal deficit present.     Mental Status: Mental status is at baseline.        Labs on Admission: I have personally reviewed following labs and imaging studies  CBC: Recent Labs  Lab 09/29/20 1455  WBC 8.2  NEUTROABS 5.5  HGB 17.5*  HCT 50.1  MCV 93.5  PLT 126*    Basic Metabolic Panel: Recent Labs  Lab 09/29/20 1455  NA 131*  K 4.4  CL 98  CO2 25  GLUCOSE 527*  BUN 17  CREATININE 0.87  CALCIUM 8.9    GFR: Estimated Creatinine Clearance: 103.9 mL/min (by C-G formula based on SCr of 0.87 mg/dL).  Liver Function Tests: Recent Labs  Lab 09/29/20 1455  AST 11*  ALT 15  ALKPHOS 94  BILITOT 0.8  PROT 6.7  ALBUMIN 4.0    Urine analysis:    Component Value Date/Time   COLORURINE STRAW (A) 09/15/2020 1522   APPEARANCEUR CLEAR (A) 09/15/2020 1522   APPEARANCEUR Clear 09/18/2019 1221   LABSPEC 1.017 09/15/2020 1522   LABSPEC 1.015 04/13/2012 1110   PHURINE 5.0 09/15/2020 1522   GLUCOSEU >=500 (A) 09/15/2020 1522   GLUCOSEU Negative 04/13/2012 1110   HGBUR SMALL (A) 09/15/2020 1522   BILIRUBINUR NEGATIVE 09/15/2020 1522   BILIRUBINUR Negative 09/18/2019 1221   BILIRUBINUR Negative 04/13/2012 [place] 09/15/2020 1522   PROTEINUR NEGATIVE 09/15/2020 1522   NITRITE  NEGATIVE 09/15/2020 1522   LEUKOCYTESUR NEGATIVE 09/15/2020 1522   LEUKOCYTESUR Negative 04/13/2012 1110    Radiological Exams on Admission: [name] results found.  EKG: Not obtained in ED.  Assessment/Plan Principal Problem:   Ischemic foot Active Problems:   Uncontrolled type 2 diabetes mellitus with hyperglycemia, with long-term current use of insulin (HCC)   Dyslipidemia   Adjustment disorder with mixed anxiety and depressed mood   Peripheral vascular disease ([place])   Needs assistance with community resources  Ischemic right foot Peripheral vascular disease > Patient has known history of peripheral artery disease presenting with ischemia of the right foot. > Had presented on 3/15 with recommendation for admission with angiography and possible intervention but he was unable to stay at that time due to social issues. > Patient is ready for admission now.  Has ongoing pain to the right foot worse with walking or palpitation. > Noted to have erythema right medial foot and leg and possible early ulceration of right heal. > Vascular surgery consulted in the ED will see the patient tomorrow > Patient has not been taking Plavix outpatient - Appreciate vascular surgery recommendations - Monitor on telemetry - Heparin drip - Pain control as needed  Depression Adjustment disorder - Continue home buspirone  Diabetes > Glucose initially 527 on CMP but CBG later in the evening showed glucose 363 - Lantus 10 units nightly - SSI with qhs - A1c  HTN - Continue home Metoprolol  Hyperlipidemia - Not currently taking statin  Social issues - Transitions of care consulted in ED  DVT prophylaxis: Treatment dose heparin  Code Status:   Full  Family Communication:  None on admission  Disposition Plan:   Patient is from:  Home  Anticipated DC to:  Home  Anticipated DC date:  1 to 2 days  Anticipated DC barriers: None  Consults called:  Vascular surgery, consulted by EDP, will see the  patient tomorrow  Admission status:  Observation,  MedSurg with telemetry   Severity of Illness: The appropriate patient status for this patient is OBSERVATION. Observation status is judged to be reasonable and necessary in order to provide the required intensity of service to ensure the patient's safety. The patient's presenting symptoms, physical exam findings, and initial radiographic and laboratory data in the context of their medical condition is felt to place them at decreased risk for further clinical deterioration. Furthermore, it is anticipated that the patient will be medically stable for discharge from the hospital within 2 midnights of admission. The following factors support the patient status of observation.   " The patient's presenting symptoms include right foot pain. " The physical exam findings include right foot pain with erythema, ulcer forming at heal. " The initial radiographic and laboratory data are glucose 527, hemoglobin 17.5, platelets 126.   Marcelyn Bruins MD [facility]  How to contact the [facility] Attending or Consulting provider [place] or covering provider during after hours [place], for this patient?   1. Check the care team in [facility] and look for a) attending/consulting [facility] provider listed and b) the [facility] team listed 2. Log into [email or web address] and use [place]'s universal password to access. If you do not have the password, please contact the hospital operator. 3. Locate the [facility] provider you are looking for under [facility] and page to a number that you can be directly reached. 4. If you still have difficulty reaching the provider, please page the [facility] (Director on Call) for the Hospitalists listed on amion for assistance.  09/29/2020, 11:26 PM

## 2020-09-29 NOTE — ED Provider Notes (Signed)
[facility] Emergency Department Provider Note   ____________________________________________   Event Date/Time   First MD Initiated Contact with Patient 09/29/20 1553     (approximate)  I have reviewed the triage vital signs and the nursing notes.   HISTORY  Chief Complaint Toe Pain    HPI [name] is a 60 y.o. male with a past medical history of type 2 diabetes, hypertension, neuropathy, tobacco abuse and significant peripheral vascular disease who presents for right toe pain with associated erythema over the past month.  Patient describes 10/10 aching/throbbing pain that radiates up his right leg and is worsened with walking or palpation.  Patient denies any relieving factors.  Patient states that he has known vascular disease in this foot and was offered admission approximately 2 weeks prior to today but refused due to social factors.  Patient states that he would be amenable to admission today.  Patient currently denies any vision changes, tinnitus, difficulty speaking, facial droop, sore throat, chest pain, shortness of breath, abdominal pain, nausea/vomiting/diarrhea, dysuria, or weakness/numbness/paresthesias in any extremity         Past Medical History:  Diagnosis Date  . Arthritis   . Blood clot in vein   . Diabetes mellitus   . DVT (deep venous thrombosis) ([place])   . Hypertension   . Neuropathy     Patient Active Problem List   Diagnosis Date Noted  . Excessive cerumen in ear canal, bilateral 05/05/2020  . History of anxiety 03/11/2020  . Type 2 diabetes mellitus with diabetic neuropathy, unspecified ([place]) 02/18/2020  . Ischemia of lower extremity 01/16/2020  . Needs assistance with community resources 09/11/2019  . Peripheral vascular disease ([place]) 10/23/2018  . Neuropathic pain of ankle, left 01/25/2017  . Adjustment disorder with mixed anxiety and depressed mood 08/27/2016  . MDD (major depressive disorder) 08/10/2016  .  Hypertriglyceridemia 06/30/2016  . Thrombocytopenia, unspecified ([place]) 12/06/2012  . Diabetic neuropathy ([place]) 12/05/2012  . Tobacco use disorder 12/05/2012  . Dyslipidemia 12/05/2012  . Controlled type 2 diabetes mellitus without complication ([place]) 54/56/2563  . Essential hypertension 05/11/2011    Past Surgical History:  Procedure Laterality Date  . APPENDECTOMY    . LOWER EXTREMITY ANGIOGRAPHY Right 01/27/2020   Procedure: LOWER EXTREMITY ANGIOGRAPHY;  Surgeon: Algernon Huxley, MD;  Location: [facility];  Service: Cardiovascular;  Laterality: Right;  . VEIN LIGATION AND STRIPPING     of left leg    Prior to Admission medications   Medication Sig Start Date End Date Taking? Authorizing Provider  acetaminophen (TYLENOL) 325 MG tablet Take 650 mg by mouth every 6 (six) hours as needed.     [provider]  aspirin 81 MG EC tablet TAKE ONE TABLET BY MOUTH EVERY DAY Patient not taking: Reported on 09/15/2020 05/05/20   Kris Hartmann, NP  atorvastatin (LIPITOR) 40 MG tablet Take 1 tablet (40 mg total) by mouth daily. Patient not taking: Reported on 09/15/2020 01/27/20   Algernon Huxley, MD  blood glucose meter kit and supplies KIT Dispense based on patient and insurance preference. Use up to four times daily as directed. (FOR ICD-9 250.00, 250.01). Patient not taking: Reported on 09/15/2020 08/15/19   Iloabachie, Chioma E, NP  busPIRone (BUSPAR) 10 MG tablet TAKE ONE TABLET BY MOUTH 3 TIMES A DAY Patient not taking: Reported on 09/15/2020 06/09/20   Iloabachie, Chioma E, NP  clopidogrel (PLAVIX) 75 MG tablet Take 1 tablet (75 mg total) by mouth daily.  Patient not taking: No sig reported 01/27/20   Algernon Huxley, MD  famotidine (PEPCID) 20 MG tablet Take 1 tablet (20 mg total) by mouth 2 (two) times daily. Patient not taking: No sig reported 02/29/20   Paulette Blanch, MD  gabapentin (NEURONTIN) 300 MG capsule Take 1 capsule (300 mg total) by mouth 3 (three) times daily. 08/15/20 08/15/21   Vanessa [place], MD  glimepiride (AMARYL) 4 MG tablet Take 1 tablet (4 mg total) by mouth in the morning and at bedtime. 08/04/20 09/03/20  Iloabachie, Chioma E, NP  glucose blood (TRUE METRIX BLOOD GLUCOSE TEST) test strip Use as instructed Patient not taking: Reported on 09/15/2020 01/31/17   Tresa Garter, MD  Insulin Glargine (BASAGLAR KWIKPEN) 100 UNIT/ML Inject 10 Units into the skin at bedtime. 08/04/20   Iloabachie, Chioma E, NP  metFORMIN (GLUCOPHAGE) 1000 MG tablet Take 1 tablet (1,000 mg total) by mouth 2 (two) times daily with a meal. 08/04/20 09/03/20  Iloabachie, Chioma E, NP  metoprolol tartrate (LOPRESSOR) 50 MG tablet Take 1 tablet (50 mg total) by mouth 2 (two) times daily. 08/04/20 09/03/20  Iloabachie, Chioma E, NP    Allergies Erythromycin base, Xanax [alprazolam], Benzodiazepines, Methocarbamol, and Sulfa antibiotics  Family History  Problem Relation Age of Onset  . Heart disease Mother   . Diabetes Father   . Heart disease Father     Social History Social History   Tobacco Use  . Smoking status: Current Every Day Smoker    Packs/day: 2.00    Years: 6.00    Pack years: 12.00    Types: Cigarettes  . Smokeless tobacco: Never Used  . Tobacco comment: 1.5-2  Vaping Use  . Vaping Use: Never used  Substance Use Topics  . Alcohol use: Yes    Alcohol/week: 4.0 standard drinks    Types: 4 Cans of beer per week  . Drug use: No    Review of Systems Constitutional: No fever/chills Eyes: No visual changes. ENT: No sore throat. Cardiovascular: Denies chest pain. Respiratory: Denies shortness of breath. Gastrointestinal: No abdominal pain.  No nausea, no vomiting.  No diarrhea. Genitourinary: Negative for dysuria. Musculoskeletal: Positive for right foot and great toe pain Skin: Negative for rash. Neurological: Negative for headaches, weakness/numbness/paresthesias in any extremity Psychiatric: Negative for suicidal ideation/homicidal  ideation   ____________________________________________   PHYSICAL EXAM:  VITAL SIGNS: ED Triage Vitals  Enc Vitals Group     BP 09/29/20 1447 130/88     Pulse Rate 09/29/20 1447 88     Resp 09/29/20 1447 18     Temp 09/29/20 1447 98.2 F (36.8 C)     Temp Source 09/29/20 1447 Oral     SpO2 09/29/20 1447 95 %     Weight 09/29/20 1448 205 lb (93 kg)     Height 09/29/20 1448 '5\' 11"'  (1.803 m)     Head Circumference --      Peak Flow --      Pain Score 09/29/20 1448 9     Pain Loc --      Pain Edu? --      Excl. in [place]? --    Constitutional: Alert and oriented. Well appearing and in no acute distress. Eyes: Conjunctivae are normal. PERRL. Head: Atraumatic. Nose: No congestion/rhinnorhea. Mouth/Throat: Mucous membranes are moist. Neck: No stridor Cardiovascular: Grossly normal heart sounds.  Unable to appreciate pulses at the right PT and DP. Respiratory: Normal respiratory effort.  No retractions. Gastrointestinal: Soft and nontender.  No distention. Musculoskeletal: No obvious deformities Neurologic:  Normal speech and language. No gross focal neurologic deficits are appreciated. Skin:  Skin is warm and dry.  Erythema, induration, and tenderness palpation over all the toes of the foot with streaking erythema up the medial aspect of the right foot Psychiatric: Mood and affect are normal. Speech and behavior are normal.  ____________________________________________   LABS (all labs ordered are listed, but only abnormal results are displayed)  Labs Reviewed  COMPREHENSIVE METABOLIC PANEL - Abnormal; Notable for the following components:      Result Value   Sodium 131 (*)    Glucose, Bld 527 (*)    AST 11 (*)    All other components within normal limits  CBC WITH DIFFERENTIAL/PLATELET - Abnormal; Notable for the following components:   Hemoglobin 17.5 (*)    Platelets 126 (*)    All other components within normal limits  RESP PANEL BY RT-PCR (FLU A&B, COVID) [id number]   LACTIC ACID, PLASMA  LACTIC ACID, PLASMA  URIC ACID  APTT  PROTIME-INR  HEPARIN LEVEL (UNFRACTIONATED)  CBC    PROCEDURES  Procedure(s) performed (including Critical Care):  .1-3 Lead EKG Interpretation Performed by: Naaman Plummer, MD Authorized by: Naaman Plummer, MD     Interpretation: normal     ECG rate:  70   ECG rate assessment: normal     Rhythm: sinus rhythm     Ectopy: none     Conduction: normal       ____________________________________________   INITIAL IMPRESSION / ASSESSMENT AND PLAN / ED COURSE  As part of my medical decision making, I reviewed the following data within the [place] notes reviewed and incorporated, Labs reviewed, EKG interpreted, Old chart reviewed, Radiograph reviewed and Notes from prior ED visits reviewed and incorporated        Patient is 60 year old male with known peripheral vascular disease who presents for painful redness and claudication in the right foot.  Patient was planning on having an admission for vascular intervention approximately 2 weeks prior to arrival however he refused to due to social reasons.  I spoke to Dr. Delana Meyer in vascular surgery who agreed to see this patient on the inpatient service for further evaluation and management.  Dispo: Admit      ____________________________________________   FINAL CLINICAL IMPRESSION(S) / ED DIAGNOSES  Final diagnoses:  Peripheral vascular disease of lower extremity with ulceration ([place])  Ischemia of toe     ED Discharge Orders    None       Note:  This document was prepared using Dragon voice recognition software and may include unintentional dictation errors.   Naaman Plummer, MD 09/29/20 [phone]

## 2020-09-30 ENCOUNTER — Encounter: Payer: Self-pay | Admitting: Internal Medicine

## 2020-09-30 ENCOUNTER — Other Ambulatory Visit (INDEPENDENT_AMBULATORY_CARE_PROVIDER_SITE_OTHER): Payer: Self-pay | Admitting: Vascular Surgery

## 2020-09-30 ENCOUNTER — Encounter
Admission: EM | Disposition: A | Discharge status: Home / Self Care | Payer: Self-pay | Source: Home / Self Care | Attending: Emergency Medicine

## 2020-09-30 DIAGNOSIS — L97909 Non-pressure chronic ulcer of unspecified part of unspecified lower leg with unspecified severity: Secondary | ICD-10-CM | POA: Diagnosis not present

## 2020-09-30 DIAGNOSIS — I739 Peripheral vascular disease, unspecified: Secondary | ICD-10-CM

## 2020-09-30 DIAGNOSIS — I998 Other disorder of circulatory system: Secondary | ICD-10-CM | POA: Diagnosis not present

## 2020-09-30 LAB — GLUCOSE, CAPILLARY
Glucose-Capillary: 157 mg/dL — ABNORMAL HIGH (ref 70–99)
Glucose-Capillary: 187 mg/dL — ABNORMAL HIGH (ref 70–99)
Glucose-Capillary: 328 mg/dL — ABNORMAL HIGH (ref 70–99)

## 2020-09-30 LAB — HEPARIN LEVEL (UNFRACTIONATED)
Heparin Unfractionated: 0.16 IU/mL — ABNORMAL LOW (ref 0.30–0.70)
Heparin Unfractionated: 0.16 IU/mL — ABNORMAL LOW (ref 0.30–0.70)

## 2020-09-30 LAB — CBC
HCT: 44.8 % (ref 39.0–52.0)
Hemoglobin: 15.6 g/dL (ref 13.0–17.0)
MCH: 32.3 pg (ref 26.0–34.0)
MCHC: 34.8 g/dL (ref 30.0–36.0)
MCV: 92.8 fL (ref 80.0–100.0)
Platelets: 109 10*3/uL — ABNORMAL LOW (ref 150–400)
RBC: 4.83 MIL/uL (ref 4.22–5.81)
RDW: 12.1 % (ref 11.5–15.5)
WBC: 5.9 10*3/uL (ref 4.0–10.5)
nRBC: 0 % (ref 0.0–0.2)

## 2020-09-30 LAB — BASIC METABOLIC PANEL
Anion gap: 5 (ref 5–15)
BUN: 21 mg/dL (ref 8–23)
CO2: 24 mmol/L (ref 22–32)
Calcium: 8.9 mg/dL (ref 8.9–10.3)
Chloride: 108 mmol/L (ref 98–111)
Creatinine, Ser: 0.73 mg/dL (ref 0.61–1.24)
GFR, Estimated: >60 mL/min (ref >60)
Glucose, Bld: 211 mg/dL — ABNORMAL HIGH (ref 70–99)
Potassium: 4 mmol/L (ref 3.5–5.1)
Sodium: 137 mmol/L (ref 135–145)

## 2020-09-30 LAB — HEMOGLOBIN A1C
Hgb A1c MFr Bld: 12.2 % — ABNORMAL HIGH (ref 4.8–5.6)
Mean Plasma Glucose: 303.44 mg/dL

## 2020-09-30 LAB — CBG MONITORING, ED: Glucose-Capillary: 328 mg/dL — ABNORMAL HIGH (ref 70–99)

## 2020-09-30 SURGERY — LOWER EXTREMITY ANGIOGRAPHY
Anesthesia: Moderate Sedation | Laterality: Right

## 2020-09-30 MED ORDER — TRAMADOL HCL 50 MG PO TABS
50.0000 mg | ORAL_TABLET | Freq: Once | ORAL | Status: AC
Start: 2020-09-30 — End: 2020-09-30
  Administered 2020-09-30: 50 mg via ORAL
  Filled 2020-09-30: qty 1

## 2020-09-30 MED ORDER — KETOROLAC TROMETHAMINE 30 MG/ML IJ SOLN
30.0000 mg | Freq: Three times a day (TID) | INTRAMUSCULAR | Status: DC | PRN
  Administered 2020-09-30: 30 mg via INTRAVENOUS
  Filled 2020-09-30: qty 1

## 2020-09-30 MED ORDER — KETOROLAC TROMETHAMINE 30 MG/ML IJ SOLN
30.0000 mg | Freq: Three times a day (TID) | INTRAMUSCULAR | Status: AC | PRN
  Administered 2020-09-30: 30 mg via INTRAVENOUS
  Filled 2020-09-30: qty 1

## 2020-09-30 MED ORDER — HEPARIN BOLUS VIA INFUSION
3000.0000 [IU] | Freq: Once | INTRAVENOUS | Status: AC
  Administered 2020-09-30: 3000 [IU] via INTRAVENOUS
  Filled 2020-09-30: qty 3000

## 2020-09-30 MED ORDER — SODIUM CHLORIDE 0.9 % IV SOLN: INTRAVENOUS | Status: DC

## 2020-09-30 MED ORDER — CEFAZOLIN SODIUM-DEXTROSE 2-4 GM/100ML-% IV SOLN
INTRAVENOUS | Status: AC
  Filled 2020-09-30: qty 100

## 2020-09-30 NOTE — Plan of Care (Signed)
  Problem: Clinical Measurements: Goal: Cardiovascular complication will be avoided Outcome: Progressing   Problem: Activity: Goal: Risk for activity intolerance will decrease Outcome: Progressing   Problem: Coping: Goal: Level of anxiety will decrease Outcome: Progressing   Problem: Safety: Goal: Ability to remain free from injury will improve Outcome: Progressing   Problem: Skin Integrity: Goal: Risk for impaired skin integrity will decrease Outcome: Progressing   

## 2020-09-30 NOTE — Progress Notes (Signed)
Patient refused to stay overnight for rescheduled angiogram tomorrow morning. Patient signed AMA form. Dr. Fran Lowes and Vascular team notified. Patient given [facility] phone number and encouraged patient to call as soon as possible.   Madie Reno, RN

## 2020-09-30 NOTE — Progress Notes (Signed)
Pt. Very anxious, stating "I can't do this procedure without general anesthesia." K. Stegmayer, PA at bedside, speaking with pt. .Pt. very adamate that he wants to go home & reschedule procedure "after I have rested a few days."

## 2020-09-30 NOTE — Progress Notes (Signed)
ANTICOAGULATION CONSULT NOTE  Pharmacy Consult for heparin Indication: peripheral vascular disease  Allergies  Allergen Reactions  . Erythromycin Base Anxiety  . Xanax [Alprazolam] Other (See Comments)    Tachycardia  . Benzodiazepines     PT STATES HE CANNOT TAKE THESE BECAUSE THEY ALMOST KILLED HIM  . Methocarbamol Anxiety and Other (See Comments)    Stomach cramping, weakness  . Sulfa Antibiotics Nausea And Vomiting and Other (See Comments)    Cramping in stomach and hyperventilate.     Patient Measurements: Height: 5\' 11"  (180.3 cm) Weight: 93 kg (205 lb 0.4 oz) IBW/kg (Calculated) : 75.3 Heparin Dosing Weight: 93 kg  Vital Signs: Temp: 97.5 F (36.4 C) (03/30 0743) Temp Source: Oral (03/30 0743) BP: 137/88 (03/30 0743) Pulse Rate: 59 (03/30 0743)  Labs: Recent Labs    09/29/20 1455 09/29/20 1632 09/30/20 0020 09/30/20 0819  HGB 17.5*  --   --  15.6  HCT 50.1  --   --  44.8  PLT 126*  --   --  109*  APTT  --  28  --   --   LABPROT  --  12.9  --   --   INR  --  1.0  --   --   HEPARINUNFRC  --   --  0.16* 0.16*  CREATININE 0.87  --   --  0.73    Estimated Creatinine Clearance: 113 mL/min (by C-G formula based on SCr of 0.73 mg/dL).   Medical History: Past Medical History:  Diagnosis Date  . Arthritis   . Blood clot in vein   . Diabetes mellitus   . DVT (deep venous thrombosis) (HCC)   . Hypertension   . Neuropathy   . Type 2 diabetes mellitus with diabetic neuropathy, unspecified (HCC) 02/18/2020     Assessment: 60 year old male with PMH of MDD, DM, HTN, HLD, PVD, thrombocytopenia & h/o DVT presented with right foot pain. Vascular surgery consulted to evaluate. No anticoagulation PTA. Pharmacy consult for heparin drip.  Date Time HL Rate/Comment 03/30 0020 0.16 Subthera; 1200 >1600 un/hr 03/30 0819 0.16 Subthera; 1600 > 1950 un/hr  Labs:  Plts: 126>109 Hgb: 17.5>15.6  Goal of Therapy:  Heparin level 0.3-0.7 units/ml Monitor platelets by  anticoagulation protocol: Yes   Plan:  3/30:  HL @ 0819 = 0.16 Subtherapeutic HL, will repeat heparin bolus of 3000 units IV X 1 and increase drip rate to 1950 units/hr. Will recheck HL 6 hrs after rate change.  CTM CBC daily with AM labs. Trend platelets closely.  4/30, PharmD 09/30/2020,9:00 AM

## 2020-09-30 NOTE — Consult Note (Signed)
[facility] Vascular Consult Note  MRN : [id number]  [name] is a 60 y.o. (09/06/1960) male who presents with chief complaint of  Chief Complaint  Patient presents with  . Toe Pain   History of Present Illness:  [name] is a 60 year old male with medical history significant of adjustment disorder, depression, diabetes, hypertension, hyperlipidemia, peripheral vascular disease, thrombocytopenia who presents with ongoing right foot pain.             Patient has history of PVD and was recently seen in the ED with recommendation for repeat angiogram and possible intervention on 3/15.  However he declined at that time due to inability to have someone help care for him after any procedures and due to needing to help care for roommate.             He presents today with ongoing right foot pain.  Described as an 8-10 out of 10 achy/throbbing pain that radiates up his right leg.  Pain worse with walking or palpation.  As above he has seen vascular surgery before he is had an intervention before per his report but did not fully resolve the issue.             He denies fevers, chills, chest pain, shortness of breath, abdominal pain, constipation, diarrhea, nausea, vomiting.  Vascular surgery was consulted by Dr. Vicente MalesBradler for possible endovascular intervention.  Current Facility-Administered Medications  Medication Dose Route Frequency Provider Last Rate Last Admin  . 0.9 %  sodium chloride infusion   Intravenous Continuous Tysheena Ginzburg A, PA-C   Stopped at 09/30/20 1236  . acetaminophen (TYLENOL) tablet 650 mg  650 mg Oral Q6H PRN Synetta FailMelvin, Alexander B, MD       Or  . acetaminophen (TYLENOL) suppository 650 mg  650 mg Rectal Q6H PRN Synetta FailMelvin, Alexander B, MD      . busPIRone (BUSPAR) tablet 10 mg  10 mg Oral BID Synetta FailMelvin, Alexander B, MD      . ceFAZolin (ANCEF) 2-4 GM/100ML-% IVPB           . heparin ADULT infusion 100 units/mL (25000 units/23850mL)  1,950 Units/hr  Intravenous Continuous Martyn MalayBeers, Brandon D, RPH   Stopped at 09/30/20 1239  . insulin aspart (novoLOG) injection 0-15 Units  0-15 Units Subcutaneous TID WC Synetta FailMelvin, Alexander B, MD   3 Units at 09/30/20 1212  . insulin aspart (novoLOG) injection 0-5 Units  0-5 Units Subcutaneous QHS Synetta FailMelvin, Alexander B, MD   4 Units at 09/30/20 0134  . insulin glargine (LANTUS) injection 10 Units  10 Units Subcutaneous QHS Synetta FailMelvin, Alexander B, MD   10 Units at 09/30/20 0134  . ketorolac (TORADOL) 30 MG/ML injection 30 mg  30 mg Intravenous Q8H PRN Darlin PriestlyLai, Tina, MD   30 mg at 09/30/20 [id number]  . metoprolol tartrate (LOPRESSOR) tablet 50 mg  50 mg Oral BID Synetta FailMelvin, Alexander B, MD   50 mg at 09/30/20 0135  . sodium chloride flush (NS) 0.9 % injection 3 mL  3 mL Intravenous Q12H Synetta FailMelvin, Alexander B, MD   3 mL at 09/30/20 [id number]   Past Medical History:  Diagnosis Date  . Arthritis   . Blood clot in vein   . Diabetes mellitus   . DVT (deep venous thrombosis) (HCC)   . Hypertension   . Neuropathy   . Type 2 diabetes mellitus with diabetic neuropathy, unspecified (HCC) 02/18/2020   Past Surgical History:  Procedure Laterality Date  .  APPENDECTOMY    . LOWER EXTREMITY ANGIOGRAPHY Right 01/27/2020   Procedure: LOWER EXTREMITY ANGIOGRAPHY;  Surgeon: Annice Needy, MD;  Location: [facility] INVASIVE CV LAB;  Service: Cardiovascular;  Laterality: Right;  . VEIN LIGATION AND STRIPPING     of left leg   Social History Social History   Tobacco Use  . Smoking status: Current Every Day Smoker    Packs/day: 2.00    Years: 6.00    Pack years: 12.00    Types: Cigarettes  . Smokeless tobacco: Never Used  . Tobacco comment: 1.5-2  Vaping Use  . Vaping Use: Never used  Substance Use Topics  . Alcohol use: Yes    Alcohol/week: 4.0 standard drinks    Types: 4 Cans of beer per week    Comment: 2 times a week  . Drug use: No   Family History Family History  Problem Relation Age of Onset  . Heart disease Mother   . Diabetes Father    . Heart disease Father   Denies family history of peripheral, venous or renal disease.  Allergies  Allergen Reactions  . Erythromycin Base Anxiety  . Xanax [Alprazolam] Other (See Comments)    Tachycardia  . Benzodiazepines     PT STATES HE CANNOT TAKE THESE BECAUSE THEY ALMOST KILLED HIM  . Methocarbamol Anxiety and Other (See Comments)    Stomach cramping, weakness  . Sulfa Antibiotics Nausea And Vomiting and Other (See Comments)    Cramping in stomach and hyperventilate.    REVIEW OF SYSTEMS (Negative unless checked)  Constitutional: [] Weight loss  [] Fever  [] Chills Cardiac: [] Chest pain   [] Chest pressure   [] Palpitations   [] Shortness of breath when laying flat   [] Shortness of breath at rest   [] Shortness of breath with exertion. Vascular:  [x] Pain in legs with walking   [] Pain in legs at rest   [x] Pain in legs when laying flat   [] Claudication   [] Pain in feet when walking  [] Pain in feet at rest  [] Pain in feet when laying flat   [] History of DVT   [] Phlebitis   [] Swelling in legs   [] Varicose veins   [] Non-healing ulcers Pulmonary:   [] Uses home oxygen   [] Productive cough   [] Hemoptysis   [] Wheeze  [] COPD   [] Asthma Neurologic:  [] Dizziness  [] Blackouts   [] Seizures   [] History of stroke   [] History of TIA  [] Aphasia   [] Temporary blindness   [] Dysphagia   [] Weakness or numbness in arms   [] Weakness or numbness in legs Musculoskeletal:  [] Arthritis   [] Joint swelling   [] Joint pain   [] Low back pain Hematologic:  [] Easy bruising  [] Easy bleeding   [] Hypercoagulable state   [] Anemic  [] Hepatitis Gastrointestinal:  [] Blood in stool   [] Vomiting blood  [] Gastroesophageal reflux/heartburn   [] Difficulty swallowing. Genitourinary:  [] Chronic kidney disease   [] Difficult urination  [] Frequent urination  [] Burning with urination   [] Blood in urine Skin:  [] Rashes   [] Ulcers   [] Wounds Psychological:  [] History of anxiety   []  History of major depression.  Physical  Examination  Vitals:   09/30/20 0743 09/30/20 1140 09/30/20 1250 09/30/20 1300  BP: 137/88 137/83 (!) 154/91   Pulse: (!) 59 63 72 68  Resp: 20 16 20 13   Temp: (!) 97.5 F (36.4 C) 97.8 F (36.6 C)    TempSrc: Oral Oral Oral   SpO2: 96% 97% 95% 99%  Weight:   93 kg   Height:   5\' 11"  (1.803 m)  Body mass index is 28.6 kg/m. Gen:  WD/WN, NAD Head: [place]/AT, No temporalis wasting. Prominent temp pulse not noted. Ear/Nose/Throat: Hearing grossly intact, nares w/o erythema or drainage, oropharynx w/o Erythema/Exudate Eyes: Sclera non-icteric, conjunctiva clear Neck: Trachea midline.  No JVD.  Pulmonary:  Good air movement, respirations not labored, equal bilaterally.  Cardiac: RRR, normal S1, S2. Vascular:  Vessel Right Left  Radial Palpable Palpable  Ulnar Palpable Palpable  Brachial Palpable Palpable  Carotid Palpable, without bruit Palpable, without bruit  Aorta Not palpable N/A  Femoral Palpable Palpable  Popliteal Palpable Palpable  PT Non-Palpable Palpable  DP Non-Palpable Palpable   Right lower extremity: Thigh soft. Calf soft. Extremity warm distally to toes. Hard to palpate pedal pulses. Delayed capillary response.  Motor/sensory is intact.  Gastrointestinal: soft, non-tender/non-distended. No guarding/reflex.  Musculoskeletal: M/S 5/5 throughout.  Extremities without ischemic changes.  No deformity or atrophy. No edema. Neurologic: Sensation grossly intact in extremities.  Symmetrical.  Speech is fluent. Motor exam as listed above. Psychiatric: Judgment intact, Mood & affect appropriate for pt's clinical situation. Dermatologic: No rashes or ulcers noted.  No cellulitis or open wounds. Lymph : No Cervical, Axillary, or Inguinal lymphadenopathy.  CBC Lab Results  Component Value Date   WBC 5.9 09/30/2020   HGB 15.6 09/30/2020   HCT 44.8 09/30/2020   MCV 92.8 09/30/2020   PLT 109 (L) 09/30/2020   BMET    Component Value Date/Time   NA 137 09/30/2020 0819    NA 138 09/18/2019 1221   NA 135 (L) 08/14/2013 1710   K 4.0 09/30/2020 0819   K 3.8 08/14/2013 1710   CL 108 09/30/2020 0819   CL 103 08/14/2013 1710   CO2 24 09/30/2020 0819   CO2 32 08/14/2013 1710   GLUCOSE 211 (H) 09/30/2020 0819   GLUCOSE 116 (H) 08/14/2013 1710   BUN 21 09/30/2020 0819   BUN 12 09/18/2019 1221   BUN 11 08/14/2013 1710   CREATININE 0.73 09/30/2020 0819   CREATININE 0.82 01/21/2016 1523   CALCIUM 8.9 09/30/2020 0819   CALCIUM 9.3 08/14/2013 1710   GFRNONAA >60 09/30/2020 0819   GFRNONAA >60 08/14/2013 1710   GFRNONAA >89 05/24/2013 1211   GFRAA >60 01/27/2020 1151   GFRAA >60 08/14/2013 1710   GFRAA >89 05/24/2013 1211   Estimated Creatinine Clearance: 113 mL/min (by C-G formula based on SCr of 0.73 mg/dL).  COAG Lab Results  Component Value Date   INR 1.0 09/29/2020   Radiology DG Foot 2 Views Right  Result Date: 09/15/2020 CLINICAL DATA:  Right foot ulcer.  Foot pain. EXAM: RIGHT FOOT - 2 VIEW COMPARISON:  Radiograph 08/15/2020 FINDINGS: There is no evidence of fracture or dislocation. Similar mild first metatarsal phalangeal joint osteoarthritis. No erosion or bony destruction. Minimal midfoot degenerative spurring. There is a plantar calcaneal spur. Site of ulcer is not well visualized by radiograph. Two tiny densities in the plantar foot in the region of the metatarsal phalangeal joint is seen only on the lateral view, not seen on prior exam. These may be superficial or within the skin. No soft tissue air. IMPRESSION: 1. Two tiny densities in the plantar foot in the region of the metatarsophalangeal joints seen only on the lateral view, may be superficial or within the skin. No soft tissue air. 2. Mild osteoarthritis of the first metatarsophalangeal joint. 3. No radiographic evidence of osteomyelitis. Electronically Signed   By: Narda Rutherford M.D.   On: 09/15/2020 18:46   Assessment/Plan [name] is  a 60 year old male with medical history  significant of adjustment disorder, depression, diabetes, hypertension, hyperlipidemia, peripheral vascular disease, thrombocytopenia who presents with ongoing right foot pain.  1.  Atherosclerotic disease to the right lower extremity: Patient with known atherosclerotic disease to the right lower extremity requiring endovascular intervention.  Last intervention July 2021. Unfortunately, patient was lost to follow-up.  We recommended the patient take aspirin, Plavix and statin for medical management of his disease.  Unsure if the patient has been compliant with his medications even though he says he does take them on a daily basis. In the setting of known atherosclerotic disease to the right lower extremity with the possibility of noncompliance with outpatient medications as well as follow-up recommend the patient undergo a repeat right lower extremity angiogram in an attempt to assess his anatomy, previous stent placement patency, appropriate an attempt to revascularize his leg can be made.  I had a long conversation with the patient in regard to the procedure, risks and benefits.  In this conversation, I told the patient he would be getting conscious sedation using Versed and fentanyl.  At the time of this conversation, patient was in agreement and agreed to proceed.   Once the patient was transported to the angiography suite the patient refused to undergo the procedure unless he was feeling general anesthesia.  Unfortunately, using general anesthesia for a angiogram would need to be scheduled in advance and is very hard to do the same day.   At this point, the patient stated that he wants to leave the hospital, go home and "gain his strength".  We reached out to primary team to update them in regard to the situation.  If the patient does stay, we have scheduled general anesthesia to be given during his angiogram tomorrow at 730.  2.  Diabetes: Patient is uncontrolled. Encouraged good control as its  slows the progression of atherosclerotic disease  3.  Tobacco abuse: We had a discussion for approximately three minutes regarding the absolute need for smoking cessation due to the deleterious nature of tobacco on the vascular system. We discussed the tobacco use would diminish patency of any intervention, and likely significantly worsen progressio of disease. We discussed multiple agents for quitting including replacement therapy or medications to reduce cravings such as Chantix. The patient voices their understanding of the importance of smoking cessation.  Discussed with Dr. Weldon Inches, PA-C  09/30/2020 1:39 PM  This note was created with Dragon medical transcription system.  Any error is purely unintentional

## 2020-09-30 NOTE — Progress Notes (Signed)
Inpatient Diabetes Program Recommendations  AACE/ADA: New Consensus Statement on Inpatient Glycemic Control (2015)  Target Ranges:  Prepandial:   less than 140 mg/dL      Peak postprandial:   less than 180 mg/dL (1-2 hours)      Critically ill patients:  140 - 180 mg/dL   Lab Results  Component Value Date   GLUCAP 187 (H) 09/30/2020   HGBA1C 13.5 (A) 08/04/2020   HGBA1C 13.5 08/04/2020   HGBA1C 0 (A) 08/04/2020   HGBA1C 0.0 08/04/2020    Review of Glycemic Control Results for [name], [name] (MRN [id number]) as of 09/30/2020 11:18  Ref. Range 09/29/2020 23:00 09/30/2020 00:20 09/30/2020 00:55 09/30/2020 08:01  Glucose-Capillary Latest Ref Range: 70 - 99 mg/dL 093 (H) 818 (H) 299 (H) 187 (H)   Diabetes history: DM2 Outpatient Diabetes medications: Lantus 12 units bid+ not taking per notes Amaryl 4 qd + Metformin Current orders for Inpatient glycemic control: Lantus 10 units q hs + Novolog 0-15 units tid ac + hs 0-5 units  Inpatient Diabetes Program Recommendations:   While NPO, -Change Novolog correction to q 4 hrs. Secure chat sent to Dr. Fran Lowes.  Thank you, Billy Fischer. Yu Cragun, RN, MSN, CDE  Diabetes Coordinator Inpatient Glycemic Control Team Team Pager [phone] (8am-5pm) 09/30/2020 11:24 AM

## 2020-09-30 NOTE — TOC Progression Note (Addendum)
Transition of Care [facility]) - Progression Note    Patient Details  Name: [name] MRN: [id number] Date of Birth: [birth year]  Transition of [facility]) CM/SW Contact  Chapman Fitch, RN Phone Number: 09/30/2020, 1:07 PM  Clinical Narrative:     I confirmed that patient is current with Dr Ricky Ala at [facility].  His last appointment was 08/04/20.  Patient has previously been getting his medications for free at [facility] .   Per note from [facility]  at 3/31  Patient had not submitted required financial information for 2022.  [facility] can assist with medications at discharge. However to continue getting his regular medications from [facility]  He needs to submit this information.  Patient updated.   Vascular consult pending. Treatment plan not established yet  Expected Discharge Plan: Home w Home Health Services Barriers to Discharge: Inadequate or no insurance,Transportation,ED Medication assistance,ED Uninsured needing PCP establishment,ED Uninsured needing placement-LOG,Continued Medical Work up,Homeless with medical needs,ED Active Substance Abuse,ED Transportation,ED Unsafe disposition  Expected Discharge Plan and Services Expected Discharge Plan: Home w Home Health Services In-house Referral: Clinical Social Work     Living arrangements for the past 2 months: Apartment                                       Social Determinants of Health (SDOH) Interventions    Readmission Risk Interventions No flowsheet data found.

## 2020-09-30 NOTE — Progress Notes (Signed)
ANTICOAGULATION CONSULT NOTE  Pharmacy Consult for heparin Indication: peripheral vascular disease  Allergies  Allergen Reactions  . Erythromycin Base Anxiety  . Xanax [Alprazolam] Other (See Comments)    Tachycardia  . Benzodiazepines     PT STATES HE CANNOT TAKE THESE BECAUSE THEY ALMOST KILLED HIM  . Methocarbamol Anxiety and Other (See Comments)    Stomach cramping, weakness  . Sulfa Antibiotics Nausea And Vomiting and Other (See Comments)    Cramping in stomach and hyperventilate.     Patient Measurements: Height: 5\' 11"  (180.3 cm) Weight: 93 kg (205 lb) IBW/kg (Calculated) : 75.3 Heparin Dosing Weight: 93 kg  Vital Signs: Temp: 97.7 F (36.5 C) (03/30 0045) Temp Source: Oral (03/29 1447) BP: 148/88 (03/30 0045) Pulse Rate: 68 (03/30 0045)  Labs: Recent Labs    09/29/20 1455 09/29/20 1632 09/30/20 0020  HGB 17.5*  --   --   HCT 50.1  --   --   PLT 126*  --   --   APTT  --  28  --   LABPROT  --  12.9  --   INR  --  1.0  --   HEPARINUNFRC  --   --  0.16*  CREATININE 0.87  --   --     Estimated Creatinine Clearance: 103.9 mL/min (by C-G formula based on SCr of 0.87 mg/dL).   Medical History: Past Medical History:  Diagnosis Date  . Arthritis   . Blood clot in vein   . Diabetes mellitus   . DVT (deep venous thrombosis) (HCC)   . Hypertension   . Neuropathy   . Type 2 diabetes mellitus with diabetic neuropathy, unspecified (HCC) 02/18/2020     Assessment: 60 year old male presented with right foot pain. No anticoagulation PTA. Pharmacy consult for heparin drip.  Goal of Therapy:  Heparin level 0.3-0.7 units/ml Monitor platelets by anticoagulation protocol: Yes   Plan:  3/30:  HL @ 0020 = 0.16 Will order heparin 3000 units IV X 1 bolus and increase drip rate to 1600 units/hr. Will recheck HL 6 hrs after rate change.   Analisia Kingsford D, PharmD 09/30/2020,1:07 AM

## 2020-09-30 NOTE — TOC Initial Note (Signed)
Transition of Care [facility]) - Initial/Assessment Note    Patient Details  Name: [name] MRN: [id number] Date of Birth: 1961/03/24  Transition of Care [facility]) CM/SW Contact:    Marina Goodell Phone Number: [phone] 09/30/2020, 8:54 AM  Clinical Narrative:                  Patient presents to [facility] due to pain on bilateral feet.  CSW spoke with patient and explained the role of TOC in patient care.  Patient immediatly began to complain to CSW about no one wanting to help him and that he will be homeless soon. Patient proceeded to tell CSW that he was parched and was in desperate need of a diet coke, and he would be able to continue speaking without the coke.  CSW asked patient if he had applied for Medicaid, and patient stated he had been denied twice, and again complained that no one would help him.  CSW asked patient for details about what the reasoning from social services was for denying him Medicaid and disability.  Patient stated he could not longer speak with me because he was too tired to talk.  CSW gave patient [facility] application and a list of [facility] resources and outpatient mental health providers.  Expected Discharge Plan: Home w Home Health Services Barriers to Discharge: Inadequate or no insurance,Transportation,ED Medication assistance,ED Uninsured needing PCP establishment,ED Uninsured needing placement-LOG,Continued Medical Work up,Homeless with medical needs,ED Active Substance Abuse,ED Transportation,ED Unsafe disposition   Patient Goals and CMS Choice Patient states their goals for this hospitalization and ongoing recovery are:: "Noone will help me. I'll be homeless in a home and I can't anyone to help me."      Expected Discharge Plan and Services Expected Discharge Plan: Home w Home Health Services In-house Referral: Clinical Social Work     Living arrangements for the past 2 months: Apartment                                       Prior Living Arrangements/Services Living arrangements for the past 2 months: Apartment Lives with:: Self Patient language and need for interpreter reviewed:: No Do you feel safe going back to the place where you live?: Yes      Need for Family Participation in Patient Care: No (Comment) Care giver support system in place?: No (comment)   Criminal Activity/Legal Involvement Pertinent to Current Situation/Hospitalization: No - Comment as needed  Activities of Daily Living Home Assistive Devices/Equipment: Cane (specify quad or straight) ADL Screening (condition at time of admission) Patient's cognitive ability adequate to safely complete daily activities?: Yes Is the patient deaf or have difficulty hearing?: No Does the patient have difficulty seeing, even when wearing glasses/contacts?: No Does the patient have difficulty concentrating, remembering, or making decisions?: No Patient able to express need for assistance with ADLs?: Yes Does the patient have difficulty dressing or bathing?: Yes Independently performs ADLs?: Yes (appropriate for developmental age) Does the patient have difficulty walking or climbing stairs?: Yes Weakness of Legs: Both Weakness of Arms/Hands: None  Permission Sought/Granted                  Emotional Assessment Appearance:: Appears stated age Attitude/Demeanor/Rapport: Complaining,Uncooperative,Attention Seeking Affect (typically observed): Afraid/Fearful,Irritable,Defensive,Frustrated Orientation: : Oriented to Self,Oriented to Place,Oriented to  Time,Oriented to Situation Alcohol / Substance Use: Not Applicable Psych Involvement:  No (comment)  Admission diagnosis:  Ischemia of toe [I99.8] Ischemic foot [I99.8] Peripheral vascular disease of lower extremity with ulceration (HCC) [I73.9, L97.909] Patient Active Problem List   Diagnosis Date Noted  . Ischemic foot 09/29/2020  . Excessive cerumen in ear canal, bilateral  05/05/2020  . History of anxiety 03/11/2020  . Ischemia of lower extremity 01/16/2020  . Needs assistance with community resources 09/11/2019  . Peripheral vascular disease (HCC) 10/23/2018  . Neuropathic pain of ankle, left 01/25/2017  . Adjustment disorder with mixed anxiety and depressed mood 08/27/2016  . MDD (major depressive disorder) 08/10/2016  . Hypertriglyceridemia 06/30/2016  . Thrombocytopenia, unspecified (HCC) 12/06/2012  . Diabetic neuropathy (HCC) 12/05/2012  . Tobacco use disorder 12/05/2012  . Dyslipidemia 12/05/2012  . Uncontrolled type 2 diabetes mellitus with hyperglycemia, with long-term current use of insulin (HCC) 05/11/2011  . Essential hypertension 05/11/2011   PCP:  Rolm Gala, NP Pharmacy:   [facility] [address] [place], [place] - [address] [place] [address] Phone: [phone] Fax: [phone]  [facility] - [place], [place] - [address] [place] [address] Phone: [phone] Fax: [phone]     Social Determinants of Health (SDOH) Interventions    Readmission Risk Interventions No flowsheet data found.

## 2020-10-01 SURGERY — LOWER EXTREMITY ANGIOGRAPHY
Anesthesia: General | Laterality: Right

## 2020-10-06 ENCOUNTER — Telehealth: Payer: Self-pay | Admitting: Gerontology

## 2020-10-06 NOTE — Telephone Encounter (Signed)
Reached out to pt and scheduled appointment

## 2020-10-08 ENCOUNTER — Other Ambulatory Visit: Payer: Self-pay

## 2020-10-08 ENCOUNTER — Other Ambulatory Visit: Payer: Self-pay | Admitting: Gerontology

## 2020-10-08 ENCOUNTER — Encounter: Payer: Self-pay | Admitting: Gerontology

## 2020-10-08 ENCOUNTER — Ambulatory Visit: Payer: Self-pay | Admitting: Gerontology

## 2020-10-08 VITALS — BP 160/90 | HR 71 | Temp 97.1°F | Ht 71.0 in | Wt 196.7 lb

## 2020-10-08 DIAGNOSIS — F17200 Nicotine dependence, unspecified, uncomplicated: Secondary | ICD-10-CM

## 2020-10-08 DIAGNOSIS — I10 Essential (primary) hypertension: Secondary | ICD-10-CM

## 2020-10-08 DIAGNOSIS — M79674 Pain in right toe(s): Secondary | ICD-10-CM | POA: Insufficient documentation

## 2020-10-08 DIAGNOSIS — E11621 Type 2 diabetes mellitus with foot ulcer: Secondary | ICD-10-CM

## 2020-10-08 DIAGNOSIS — E1149 Type 2 diabetes mellitus with other diabetic neurological complication: Secondary | ICD-10-CM

## 2020-10-08 MED ORDER — METOPROLOL TARTRATE 50 MG PO TABS
ORAL_TABLET | Freq: Two times a day (BID) | ORAL | 2 refills | Status: DC
  Filled 2020-10-08: qty 60, 30d supply, fill #0
  Filled 2020-10-26: qty 60, 30d supply, fill #1
  Filled 2020-12-08: qty 28, 14d supply, fill #2

## 2020-10-08 MED ORDER — METFORMIN HCL 1000 MG PO TABS
ORAL_TABLET | Freq: Two times a day (BID) | ORAL | 2 refills | Status: DC
  Filled 2020-10-08: qty 60, 30d supply, fill #0
  Filled 2020-10-26: qty 60, 30d supply, fill #1
  Filled 2020-12-08: qty 28, 14d supply, fill #2

## 2020-10-08 MED ORDER — BASAGLAR KWIKPEN 100 UNIT/ML ~~LOC~~ SOPN
24.0000 [IU] | PEN_INJECTOR | Freq: Every day | SUBCUTANEOUS | 3 refills | Status: DC
  Filled 2020-10-08: qty 15, 62d supply, fill #0
  Filled 2021-01-20: qty 15, 62d supply, fill #1

## 2020-10-08 MED FILL — Insulin Pen Needle 32 G X 4 MM (1/6" or 5/32"): 30 days supply | Qty: 100 | Fill #0 | Status: AC

## 2020-10-08 NOTE — Patient Instructions (Signed)

## 2020-10-08 NOTE — Progress Notes (Signed)
Established Patient Office Visit  Subjective:  Patient ID: [name], male    DOB: 08-Jul-1960  Age: 60 y.o. MRN: [id number]  CC:  Chief Complaint  Patient presents with  . Foot Injury    Complains of significant right foot pain, has been an ongoing issue and requires surgery    HPI BRANSEN FASSNACHT presents for follow up of T2DM and lab review. His HgbA1c done on 09/30/20 decreased from 13.5% to 12.2%. He reports that he has been taking 12 units Basaglar tid and takes his rarely take his Metformin. His not consistent with checking his blood glucose, he states that it was 276 mg/dl when checked yesterday at 3 pm. It was 334 mg/dl when checked during visit . He admits to experiencing hyperglycemic symptoms and peripheral neuropathy. He takes Gabapentin 300 mg daily instead of tid. He had multiple ED visits last month for c/o right foot pain and was referred to Vascular Surgery Dr Delana Meyer for Peripheral Vascular disease of lower extremity with ulceration ischemia of toe.  He was seen during a hospital visit on 09/30/20 by Vascular Surgery  Stegmayer K A PA-C. He declined Angiogram during visit and left Against Medical Advise because he has no one to help him after procedure Also, he states that he has not started taking Atorvastatin. Currently, he states that he continues to experience constant sharp pain to right great toe, and dried eschar to callus on his right heel. Healing ulcer to right great toe, no erythema nor swelling. Overall, he states that he's worried about his right foot, and his inability to afford a comfortable walking shoes.    Past Medical History:  Diagnosis Date  . Arthritis   . Blood clot in vein   . Diabetes mellitus   . DVT (deep venous thrombosis) ([place])   . Hypertension   . Neuropathy   . Type 2 diabetes mellitus with diabetic neuropathy, unspecified ([place]) 02/18/2020    Past Surgical History:  Procedure Laterality Date  . APPENDECTOMY    . LOWER EXTREMITY  ANGIOGRAPHY Right 01/27/2020   Procedure: LOWER EXTREMITY ANGIOGRAPHY;  Surgeon: Algernon Huxley, MD;  Location: [facility];  Service: Cardiovascular;  Laterality: Right;  . VEIN LIGATION AND STRIPPING     of left leg    Family History  Problem Relation Age of Onset  . Heart disease Mother   . Diabetes Father   . Heart disease Father     Social History   Socioeconomic History  . Marital status: Divorced    Spouse name: Not on file  . Number of children: 0  . Years of education: Not on file  . Highest education level: Not on file  Occupational History  . Not on file  Tobacco Use  . Smoking status: Current Every Day Smoker    Packs/day: 2.00    Years: 6.00    Pack years: 12.00    Types: Cigarettes  . Smokeless tobacco: Never Used  . Tobacco comment: 1.5-2  Vaping Use  . Vaping Use: Never used  Substance and Sexual Activity  . Alcohol use: Yes    Alcohol/week: 4.0 standard drinks    Types: 4 Cans of beer per week    Comment: 2 times a week  . Drug use: No  . Sexual activity: Not on file  Other Topics Concern  . Not on file  Social History Narrative   Social determinants screening completed 09/19/2019. HS      Patient was  given resource for bus passes to link transit and non file tax for information for the stimulus check. [facility] [facility] referral made on 5/25 for future lab and follow up appointments at the clinic by scheduling ride coordination through [employer]. HS   Social Determinants of Health   Financial Resource Strain: High Risk  . Difficulty of Paying Living Expenses: Very hard  Food Insecurity: Not on file  Transportation Needs: Unmet Transportation Needs  . Lack of Transportation (Medical): Yes  . Lack of Transportation (Non-Medical): Yes  Physical Activity: Not on file  Stress: Not on file  Social Connections: Not on file  Intimate Partner Violence: Not on file    Outpatient Medications Prior to Visit  Medication Sig Dispense Refill  .  acetaminophen (TYLENOL) 325 MG tablet Take 650 mg by mouth every 6 (six) hours as needed.     Marland Kitchen aspirin 81 MG EC tablet TAKE ONE TABLET BY MOUTH EVERY DAY 90 tablet 0  . busPIRone (BUSPAR) 10 MG tablet TAKE ONE TABLET BY MOUTH 3 TIMES A DAY 90 tablet 0  . clopidogrel (PLAVIX) 75 MG tablet Take 1 tablet (75 mg total) by mouth daily. 30 tablet 11  . gabapentin (NEURONTIN) 300 MG capsule TAKE ONE CAPSULE BY MOUTH 3 TIMES A DAY 90 capsule 2  . Insulin Glargine (BASAGLAR KWIKPEN) 100 UNIT/ML INJECT 10 UNITS UNDER THE SKIN AT BEDTIME 5 mL 3  . metoprolol tartrate (LOPRESSOR) 50 MG tablet TAKE ONE TABLET BY MOUTH 2 TIMES A DAY 60 tablet 2  . atorvastatin (LIPITOR) 40 MG tablet Take 1 tablet (40 mg total) by mouth daily. (Patient not taking: No sig reported) 30 tablet 3  . blood glucose meter kit and supplies KIT Dispense based on patient and insurance preference. Use up to four times daily as directed. (FOR ICD-9 250.00, 250.01). (Patient not taking: No sig reported) 1 each 0  . Blood Glucose Monitoring Suppl (RIGHTEST GM550 BLOOD GLUCOSE) w/Device KIT USE AS DIRECTED 1 kit 1  . COMFORT EZ PEN NEEDLES 32G X 4 MM MISC USE WITH BASAGLAR AS DIRECTED 100 each 11  . famotidine (PEPCID) 20 MG tablet Take 1 tablet (20 mg total) by mouth 2 (two) times daily. (Patient not taking: No sig reported) 8 tablet 0  . glucose blood (TRUE METRIX BLOOD GLUCOSE TEST) test strip Use as instructed (Patient not taking: No sig reported) 100 each 12  . glucose blood test strip USE TO TEST BLOOD GLUCOSE UP TO 4 TIMES PER DAY, IN THE MORNING AND AFTER MEALS 100 strip 11  . naproxen sodium (ALEVE) 220 MG tablet Take 220 mg by mouth daily as needed. (Patient not taking: Reported on 10/08/2020)    . Rightest GL300 Lancets MISC USE AS DIRECTED 100 each 11  . cephALEXin (KEFLEX) 500 MG capsule TAKE ONE CAPSULE BY MOUTH FOUR TIMES A DAY OR 10 DAYS (Patient not taking: Reported on 10/08/2020) 40 capsule 0  . doxycycline (VIBRAMYCIN) 100 MG  capsule TAKE ONE CAPSULE BY MOUTH 2 TIMES A DAY FOR 10 DAYS (Patient not taking: Reported on 10/08/2020) 20 capsule 0  . glimepiride (AMARYL) 2 MG tablet TAKE TWO TABLETS BY MOUTH EVERY MORNING AND AT BEDTIME (Patient not taking: Reported on 10/08/2020) 120 tablet 1  . glimepiride (AMARYL) 4 MG tablet TAKE ONE TABLET OTHER EVERY MORNING AND AT BEDTIME (Patient not taking: No sig reported) 60 tablet 2  . metFORMIN (GLUCOPHAGE) 1000 MG tablet TAKE ONE TABLET BY MOUTH 2 TIMES A DAY WITH A  MEAL (Patient not taking: No sig reported) 60 tablet 2   No facility-administered medications prior to visit.    Allergies  Allergen Reactions  . Erythromycin Base Anxiety  . Xanax [Alprazolam] Other (See Comments)    Tachycardia  . Benzodiazepines     PT STATES HE CANNOT TAKE THESE BECAUSE THEY ALMOST KILLED HIM  . Methocarbamol Anxiety and Other (See Comments)    Stomach cramping, weakness  . Sulfa Antibiotics Nausea And Vomiting and Other (See Comments)    Cramping in stomach and hyperventilate.     ROS Review of Systems  Constitutional: Negative.   Respiratory: Negative.   Cardiovascular: Negative.   Endocrine: Positive for polyuria.  Skin: Positive for wound (healing blister to right greath toe and eschar covered callus to right heel.Marland Kitchen).  Neurological: Negative.       Objective:    Physical Exam HENT:     Head: Normocephalic and atraumatic.  Eyes:     Extraocular Movements: Extraocular movements intact.     Conjunctiva/sclera: Conjunctivae normal.     Pupils: Pupils are equal, round, and reactive to light.  Cardiovascular:     Rate and Rhythm: Normal rate and regular rhythm.     Pulses: Normal pulses.     Heart sounds: Normal heart sounds.  Pulmonary:     Effort: Pulmonary effort is normal.     Breath sounds: Normal breath sounds.  Musculoskeletal:       Feet:  Neurological:     Mental Status: He is alert.     BP (!) 160/90 (BP Location: Right Arm, Patient Position: Sitting)    Pulse 71   Temp (!) 97.1 F (36.2 C)   Ht _0  (1.803 m)   Wt 196 lb 11.2 oz (89.2 kg)   SpO2 97%   BMI 27.43 kg/m  Wt Readings from Last 3 Encounters:  10/08/20 196 lb 11.2 oz (89.2 kg)  09/30/20 205 lb 0.4 oz (93 kg)  09/15/20 190 lb (86.2 kg)     Health Maintenance Due  Topic Date Due  . Hepatitis C Screening  Never done  . PNEUMOCOCCAL POLYSACCHARIDE VACCINE AGE 19-64 HIGH RISK  Never done  . COVID-19 Vaccine (1) Never done  . COLONOSCOPY (Pts 45-22yr Insurance coverage will need to be confirmed)  Never done  . FOOT EXAM  08/14/2020  . OPHTHALMOLOGY EXAM  08/18/2020  . URINE MICROALBUMIN  09/17/2020    There are no preventive care reminders to display for this patient.  Lab Results  Component Value Date   TSH 2.070 09/18/2019   Lab Results  Component Value Date   WBC 5.9 09/30/2020   HGB 15.6 09/30/2020   HCT 44.8 09/30/2020   MCV 92.8 09/30/2020   PLT 109 (L) 09/30/2020   Lab Results  Component Value Date   NA 137 09/30/2020   K 4.0 09/30/2020   CO2 24 09/30/2020   GLUCOSE 211 (H) 09/30/2020   BUN 21 09/30/2020   CREATININE 0.73 09/30/2020   BILITOT 0.8 09/29/2020   ALKPHOS 94 09/29/2020   AST 11 (L) 09/29/2020   ALT 15 09/29/2020   PROT 6.7 09/29/2020   ALBUMIN 4.0 09/29/2020   CALCIUM 8.9 09/30/2020   ANIONGAP 5 09/30/2020   Lab Results  Component Value Date   CHOL 151 04/22/2020   Lab Results  Component Value Date   HDL 32 (L) 04/22/2020   Lab Results  Component Value Date   LDLCALC 93 04/22/2020   Lab Results  Component Value Date  TRIG 147 04/22/2020   Lab Results  Component Value Date   CHOLHDL 4.7 04/22/2020   Lab Results  Component Value Date   HGBA1C 12.2 (H) 09/30/2020      Assessment & Plan:   1. Type 2 diabetes mellitus with other neurologic complication, without long-term current use of insulin (HCC) - His HgbA1c was 12.2%, his goal should be less than 7%. He was encouraged to check his blood glucose bid, his  fasting reading should be between 80-130 mg/dl. His Basaglar was increased to 24 units at bedtime and to take 1000 mg of Metformin bid. He was advised to continue on low carb/ non concentrated sweet diet. - POCT Glucose (CBG); Future - Insulin Glargine (BASAGLAR KWIKPEN) 100 UNIT/ML; Inject 24 Units into the skin at bedtime.  Dispense: 15 mL; Refill: 3 - metFORMIN (GLUCOPHAGE) 1000 MG tablet; TAKE ONE TABLET BY MOUTH 2 TIMES A DAY WITH A MEAL  Dispense: 60 tablet; Refill: 2 - POCT Glucose (CBG)  2. Tobacco use disorder - He was strongly encouraged on smoking cessation, was provided with [place] Quitline information.  3. Diabetic ulcer of right heel associated with type 2 diabetes mellitus, unspecified ulcer stage ([place]) - He was encouraged to follow up with the Podiatrist, proper hygiene and daily foot check. - Ambulatory referral to Podiatry  4. Pain of right great toe - Same as in #3 - Ambulatory referral to Podiatry     Follow-up: Return in about 27 days (around 11/04/2020), or if symptoms worsen or fail to improve.    Genna Casimir Jerold Coombe, NP

## 2020-10-09 ENCOUNTER — Other Ambulatory Visit: Payer: Self-pay

## 2020-10-10 LAB — GLUCOSE, POCT (MANUAL RESULT ENTRY): POC Glucose: 334 mg/dl — AB (ref 70–99)

## 2020-10-26 ENCOUNTER — Other Ambulatory Visit: Payer: Self-pay

## 2020-10-26 MED FILL — Lancets: 30 days supply | Qty: 100 | Fill #0 | Status: AC

## 2020-10-26 MED FILL — Glucose Blood Test Strip: 30 days supply | Qty: 100 | Fill #0 | Status: AC

## 2020-10-26 MED FILL — Gabapentin Cap 300 MG: ORAL | 30 days supply | Qty: 90 | Fill #0 | Status: AC

## 2020-10-26 MED FILL — Insulin Pen Needle 32 G X 4 MM (1/6" or 5/32"): 30 days supply | Qty: 100 | Fill #1 | Status: AC

## 2020-10-27 ENCOUNTER — Other Ambulatory Visit: Payer: Self-pay

## 2020-11-05 ENCOUNTER — Ambulatory Visit: Payer: Self-pay | Admitting: Gerontology

## 2020-11-09 ENCOUNTER — Encounter: Payer: Self-pay | Admitting: *Deleted

## 2020-11-09 ENCOUNTER — Observation Stay
Admission: EM | Admit: 2020-11-09 | Discharge: 2020-11-10 | DRG: 603 | Discharge status: Against Medical Advice | Payer: Medicaid Other | Attending: Internal Medicine | Admitting: Internal Medicine

## 2020-11-09 ENCOUNTER — Other Ambulatory Visit: Payer: Self-pay

## 2020-11-09 ENCOUNTER — Emergency Department: Payer: Medicaid Other

## 2020-11-09 DIAGNOSIS — F17210 Nicotine dependence, cigarettes, uncomplicated: Secondary | ICD-10-CM | POA: Diagnosis not present

## 2020-11-09 DIAGNOSIS — Z5329 Procedure and treatment not carried out because of patient's decision for other reasons: Secondary | ICD-10-CM | POA: Diagnosis present

## 2020-11-09 DIAGNOSIS — Z79899 Other long term (current) drug therapy: Secondary | ICD-10-CM | POA: Diagnosis not present

## 2020-11-09 DIAGNOSIS — Z5900 Homelessness unspecified: Secondary | ICD-10-CM

## 2020-11-09 DIAGNOSIS — R739 Hyperglycemia, unspecified: Secondary | ICD-10-CM

## 2020-11-09 DIAGNOSIS — Z9114 Patient's other noncompliance with medication regimen: Secondary | ICD-10-CM | POA: Diagnosis not present

## 2020-11-09 DIAGNOSIS — Z86718 Personal history of other venous thrombosis and embolism: Secondary | ICD-10-CM

## 2020-11-09 DIAGNOSIS — I70221 Atherosclerosis of native arteries of extremities with rest pain, right leg: Secondary | ICD-10-CM | POA: Diagnosis present

## 2020-11-09 DIAGNOSIS — Z716 Tobacco abuse counseling: Secondary | ICD-10-CM

## 2020-11-09 DIAGNOSIS — Z881 Allergy status to other antibiotic agents status: Secondary | ICD-10-CM

## 2020-11-09 DIAGNOSIS — E11621 Type 2 diabetes mellitus with foot ulcer: Secondary | ICD-10-CM | POA: Diagnosis present

## 2020-11-09 DIAGNOSIS — Z7902 Long term (current) use of antithrombotics/antiplatelets: Secondary | ICD-10-CM | POA: Diagnosis not present

## 2020-11-09 DIAGNOSIS — L97419 Non-pressure chronic ulcer of right heel and midfoot with unspecified severity: Secondary | ICD-10-CM | POA: Diagnosis present

## 2020-11-09 DIAGNOSIS — Z9119 Patient's noncompliance with other medical treatment and regimen: Secondary | ICD-10-CM

## 2020-11-09 DIAGNOSIS — M1990 Unspecified osteoarthritis, unspecified site: Secondary | ICD-10-CM | POA: Diagnosis not present

## 2020-11-09 DIAGNOSIS — L03115 Cellulitis of right lower limb: Secondary | ICD-10-CM | POA: Diagnosis not present

## 2020-11-09 DIAGNOSIS — E1165 Type 2 diabetes mellitus with hyperglycemia: Secondary | ICD-10-CM

## 2020-11-09 DIAGNOSIS — I739 Peripheral vascular disease, unspecified: Secondary | ICD-10-CM

## 2020-11-09 DIAGNOSIS — I10 Essential (primary) hypertension: Secondary | ICD-10-CM | POA: Diagnosis present

## 2020-11-09 DIAGNOSIS — Z20822 Contact with and (suspected) exposure to covid-19: Secondary | ICD-10-CM | POA: Diagnosis not present

## 2020-11-09 DIAGNOSIS — E878 Other disorders of electrolyte and fluid balance, not elsewhere classified: Secondary | ICD-10-CM | POA: Diagnosis not present

## 2020-11-09 DIAGNOSIS — Z7984 Long term (current) use of oral hypoglycemic drugs: Secondary | ICD-10-CM | POA: Diagnosis not present

## 2020-11-09 DIAGNOSIS — L97519 Non-pressure chronic ulcer of other part of right foot with unspecified severity: Secondary | ICD-10-CM | POA: Diagnosis present

## 2020-11-09 DIAGNOSIS — Z833 Family history of diabetes mellitus: Secondary | ICD-10-CM

## 2020-11-09 DIAGNOSIS — Z8249 Family history of ischemic heart disease and other diseases of the circulatory system: Secondary | ICD-10-CM

## 2020-11-09 DIAGNOSIS — E871 Hypo-osmolality and hyponatremia: Secondary | ICD-10-CM | POA: Diagnosis not present

## 2020-11-09 DIAGNOSIS — E1151 Type 2 diabetes mellitus with diabetic peripheral angiopathy without gangrene: Secondary | ICD-10-CM | POA: Diagnosis not present

## 2020-11-09 DIAGNOSIS — Z7982 Long term (current) use of aspirin: Secondary | ICD-10-CM

## 2020-11-09 DIAGNOSIS — E1142 Type 2 diabetes mellitus with diabetic polyneuropathy: Secondary | ICD-10-CM | POA: Diagnosis not present

## 2020-11-09 DIAGNOSIS — E785 Hyperlipidemia, unspecified: Secondary | ICD-10-CM | POA: Diagnosis present

## 2020-11-09 DIAGNOSIS — Z794 Long term (current) use of insulin: Secondary | ICD-10-CM

## 2020-11-09 DIAGNOSIS — Z888 Allergy status to other drugs, medicaments and biological substances status: Secondary | ICD-10-CM

## 2020-11-09 DIAGNOSIS — Z882 Allergy status to sulfonamides status: Secondary | ICD-10-CM

## 2020-11-09 LAB — CBC
HCT: 48.6 % (ref 39.0–52.0)
Hemoglobin: 17.1 g/dL — ABNORMAL HIGH (ref 13.0–17.0)
MCH: 32.3 pg (ref 26.0–34.0)
MCHC: 35.2 g/dL (ref 30.0–36.0)
MCV: 91.9 fL (ref 80.0–100.0)
Platelets: 129 K/uL — ABNORMAL LOW (ref 150–400)
RBC: 5.29 MIL/uL (ref 4.22–5.81)
RDW: 11.9 % (ref 11.5–15.5)
WBC: 6.7 K/uL (ref 4.0–10.5)
nRBC: 0 % (ref 0.0–0.2)

## 2020-11-09 LAB — BASIC METABOLIC PANEL WITH GFR
Anion gap: 10 (ref 5–15)
BUN: 13 mg/dL (ref 8–23)
CO2: 26 mmol/L (ref 22–32)
Calcium: 9.4 mg/dL (ref 8.9–10.3)
Chloride: 95 mmol/L — ABNORMAL LOW (ref 98–111)
Creatinine, Ser: 0.88 mg/dL (ref 0.61–1.24)
GFR, Estimated: >60 mL/min
Glucose, Bld: 530 mg/dL (ref 70–99)
Potassium: 4 mmol/L (ref 3.5–5.1)
Sodium: 131 mmol/L — ABNORMAL LOW (ref 135–145)

## 2020-11-09 MED ORDER — VANCOMYCIN HCL 2000 MG/400ML IV SOLN
2000.0000 mg | Freq: Once | INTRAVENOUS | Status: AC
  Administered 2020-11-10: 2000 mg via INTRAVENOUS
  Filled 2020-11-09: qty 400

## 2020-11-09 MED ORDER — PIPERACILLIN-TAZOBACTAM 3.375 G IVPB 30 MIN
3.3750 g | Freq: Once | INTRAVENOUS | Status: AC
  Administered 2020-11-10: 3.375 g via INTRAVENOUS
  Filled 2020-11-09: qty 50

## 2020-11-09 MED ORDER — HEPARIN BOLUS VIA INFUSION
5000.0000 [IU] | Freq: Once | INTRAVENOUS | Status: AC
  Administered 2020-11-10: 5000 [IU] via INTRAVENOUS
  Filled 2020-11-09: qty 5000

## 2020-11-09 MED ORDER — SODIUM CHLORIDE 0.9 % IV BOLUS
500.0000 mL | Freq: Once | INTRAVENOUS | Status: AC
  Administered 2020-11-10: 500 mL via INTRAVENOUS

## 2020-11-09 MED ORDER — HYDROMORPHONE HCL 1 MG/ML IJ SOLN
0.5000 mg | INTRAMUSCULAR | Status: DC | PRN
Start: 2020-11-09 — End: 2020-11-10
  Administered 2020-11-10 (×2): 0.5 mg via INTRAVENOUS
  Filled 2020-11-09 (×2): qty 1

## 2020-11-09 MED ORDER — INSULIN ASPART 100 UNIT/ML IJ SOLN
8.0000 [IU] | Freq: Once | INTRAMUSCULAR | Status: AC
  Administered 2020-11-10: 8 [IU] via SUBCUTANEOUS
  Filled 2020-11-09: qty 1

## 2020-11-09 MED ORDER — HEPARIN (PORCINE) 25000 UT/250ML-% IV SOLN
1500.0000 [IU]/h | INTRAVENOUS | Status: DC
  Administered 2020-11-10: 1500 [IU]/h via INTRAVENOUS
  Filled 2020-11-09 (×2): qty 250

## 2020-11-09 NOTE — H&P (Addendum)
[place]   PATIENT NAME: [name]    MR#:  [id number]  DATE OF BIRTH:  22-Mar-1961  DATE OF ADMISSION:  11/09/2020  PRIMARY CARE PHYSICIAN: Richard Reusing, NP   Patient is coming from: Home  REQUESTING/REFERRING PHYSICIAN: Merlyn Lot, MD  CHIEF COMPLAINT:   Chief Complaint  Patient presents with  . Foot Pain    HISTORY OF PRESENT ILLNESS:  [name] is a 60 y.o. Caucasian male with medical history significant for type II diabetes mellitus, PAD, hypertension, peripheral neuropathy and DVT, who presented to the ER with acute onset of right foot erythema, swelling, pain and tenderness.  She described his pain as burning.  He denied any fever or chills.  No chest pain or dyspnea or palpitations.  No nausea or vomiting or abdominal pain.  No cough or wheezing.  ED Course: Upon presentation to the ER, vital signs were within normal.  Labs revealed hyponatremia 131 hypochloremia of 95 and hyperglycemia 530.  Lactic acid was 3.1.  CBC showed hemoconcentration.  COVID-19 PCR back negative.  EKG as reviewed by me :   EKG showed sinus rhythm with a rate of 74, with  earlier presentation. Imaging: Right foot x-ray showed mild calcaneal spur with no findings to suggest osteomyelitis.  Dr. Franchot [name] was notified that the patient given suspected peripheral vascular disease.  The patient was started on IV heparin bolus and drip as well as IV vancomycin and Zosyn.  He was given 0.5 mg IV Dilaudid for pain and 8 units of NovoLog subcutaneously.  He will be admitted to a medical bed for further evaluation and management. PAST MEDICAL HISTORY:   Past Medical History:  Diagnosis Date  . Arthritis   . Blood clot in vein   . Diabetes mellitus   . DVT (deep venous thrombosis) ([place])   . Hypertension   . Neuropathy   . Type 2 diabetes mellitus with diabetic neuropathy, unspecified ([place]) 02/18/2020    PAST SURGICAL HISTORY:   Past Surgical History:  Procedure Laterality  Date  . APPENDECTOMY    . LOWER EXTREMITY ANGIOGRAPHY Right 01/27/2020   Procedure: LOWER EXTREMITY ANGIOGRAPHY;  Surgeon: Richard Huxley, MD;  Location: [facility];  Service: Cardiovascular;  Laterality: Right;  . VEIN LIGATION AND STRIPPING     of left leg    SOCIAL HISTORY:   Social History   Tobacco Use  . Smoking status: Current Every Day Smoker    Packs/day: 2.00    Years: 6.00    Pack years: 12.00    Types: Cigarettes  . Smokeless tobacco: Never Used  . Tobacco comment: 1.5-2  Substance Use Topics  . Alcohol use: Not Currently    Alcohol/week: 4.0 standard drinks    Types: 4 Cans of beer per week    Comment: 2 times a week    FAMILY HISTORY:   Family History  Problem Relation Age of Onset  . Heart disease Mother   . Diabetes Father   . Heart disease Father     DRUG ALLERGIES:   Allergies  Allergen Reactions  . Erythromycin Base Anxiety  . Xanax [Alprazolam] Other (See Comments)    Tachycardia  . Benzodiazepines     PT STATES HE CANNOT TAKE THESE BECAUSE THEY ALMOST KILLED HIM  . Methocarbamol Anxiety and Other (See Comments)    Stomach cramping, weakness  . Sulfa Antibiotics Nausea And Vomiting and Other (See Comments)    Cramping in stomach  and hyperventilate.     REVIEW OF SYSTEMS:   ROS As per history of present illness. All pertinent systems were reviewed above. Constitutional, HEENT, cardiovascular, respiratory, GI, GU, musculoskeletal, neuro, psychiatric, endocrine, integumentary and hematologic systems were reviewed and are otherwise negative/unremarkable except for positive findings mentioned above in the HPI.   MEDICATIONS AT HOME:   Prior to Admission medications   Medication Sig Start Date End Date Taking? Authorizing Provider  aspirin 81 MG EC tablet TAKE ONE TABLET BY MOUTH EVERY DAY 05/05/20  Yes Richard Hartmann, NP  Blood Glucose Monitoring Suppl [facility] BLOOD GLUCOSE) w/Device KIT USE AS DIRECTED 08/04/20 08/04/21 Yes    clopidogrel (PLAVIX) 75 MG tablet Take 1 tablet (75 mg total) by mouth daily. 01/27/20  Yes Richard Huxley, MD  COMFORT EZ PEN NEEDLES 32G X 4 MM MISC USE WITH BASAGLAR AS DIRECTED 08/04/20 08/04/21 Yes   acetaminophen (TYLENOL) 325 MG tablet Take 650 mg by mouth every 6 (six) hours as needed.     [provider]  atorvastatin (LIPITOR) 40 MG tablet Take 1 tablet (40 mg total) by mouth daily. Patient not taking: No sig reported 01/27/20   Richard Huxley, MD  blood glucose meter kit and supplies KIT Dispense based on patient and insurance preference. Use up to four times daily as directed. (FOR ICD-9 250.00, 250.01). Patient not taking: No sig reported 08/15/19   [name], Richard E, NP  busPIRone (BUSPAR) 10 MG tablet TAKE ONE TABLET BY MOUTH 3 TIMES A DAY 06/09/20 12/31/20  [name], Richard E, NP  famotidine (PEPCID) 20 MG tablet Take 1 tablet (20 mg total) by mouth 2 (two) times daily. Patient not taking: No sig reported 02/29/20   Richard Blanch, MD  gabapentin (NEURONTIN) 300 MG capsule TAKE ONE CAPSULE BY MOUTH 3 TIMES A DAY 08/15/20 08/15/21  Richard [place], MD  glucose blood (TRUE METRIX BLOOD GLUCOSE TEST) test strip Use as instructed Patient not taking: No sig reported 01/31/17   Richard Chessman E, MD  glucose blood test strip USE TO TEST BLOOD GLUCOSE UP TO 4 TIMES PER DAY, IN THE MORNING AND AFTER MEALS 08/04/20 08/04/21    Insulin Glargine (BASAGLAR KWIKPEN) 100 UNIT/ML Inject 24 Units into the skin at bedtime. 10/08/20 10/08/21  [name], Richard E, NP  metFORMIN (GLUCOPHAGE) 1000 MG tablet TAKE ONE TABLET BY MOUTH 2 TIMES A DAY WITH A MEAL 10/08/20 10/08/21  [name], Richard E, NP  metoprolol tartrate (LOPRESSOR) 50 MG tablet TAKE ONE TABLET BY MOUTH 2 TIMES A DAY 10/08/20 10/08/21  [name], Richard E, NP  naproxen sodium (ALEVE) 220 MG tablet Take 220 mg by mouth daily as needed. Patient not taking: Reported on 10/08/2020    [provider]  Rightest GL300 Lancets MISC USE AS DIRECTED  08/04/20 08/04/21        VITAL SIGNS:  Blood pressure 129/89, pulse 85, temperature 98.5 F (36.9 C), temperature source Oral, resp. rate 18, height '5\' 11"'  (1.803 m), weight 88.5 kg, SpO2 96 %.  PHYSICAL EXAMINATION:  Physical Exam  GENERAL:  60 y.o.-year-old Caucasian male patient lying in the bed with no acute distress.  EYES: Pupils equal, round, reactive to light and accommodation. No scleral icterus. Extraocular muscles intact.  HEENT: Head atraumatic, normocephalic. Oropharynx and nasopharynx clear.  NECK:  Supple, no jugular venous distention. No thyroid enlargement, no tenderness.  LUNGS: Normal breath sounds bilaterally, no wheezing, rales,rhonchi or crepitation. No use of accessory muscles of respiration.  CARDIOVASCULAR: Regular  rate and rhythm, S1, S2 normal. No murmurs, rubs, or gallops.  ABDOMEN: Soft, nondistended, nontender. Bowel sounds present. No organomegaly or mass.  EXTREMITIES: No pedal edema, cyanosis, or clubbing.  NEUROLOGIC: Cranial nerves II through XII are intact. Muscle strength 5/5 in all extremities. Sensation intact. Gait not checked.  PSYCHIATRIC: The patient is alert and oriented x 3.  Normal affect and good eye contact. SKIN: Right foot erythema with right big toe and heel ulcers.  This foot was warm however with diminished DP and PT pulses.       LABORATORY PANEL:   CBC Recent Labs  Lab 11/09/20 2135  WBC 6.7  HGB 17.1*  HCT 48.6  PLT 129*   ------------------------------------------------------------------------------------------------------------------  Chemistries  Recent Labs  Lab 11/09/20 2135  NA 131*  K 4.0  CL 95*  CO2 26  GLUCOSE 530*  BUN 13  CREATININE 0.88  CALCIUM 9.4   ------------------------------------------------------------------------------------------------------------------  Cardiac Enzymes No results for input(s): TROPONINI in the last 168  hours. ------------------------------------------------------------------------------------------------------------------  RADIOLOGY:  No results found.    IMPRESSION AND PLAN:  Active Problems:   Cellulitis of right foot 1.  Right for severe nonpurulent cellulitis with heel and right big toe ulcers likely infected and probably the culprit.  He has suspected ischemia with peripheral arterial disease. - The patient will be admitted to a medical bed. - We will continue antibiotic therapy with IV cefepime and vancomycin. - Pain management to be provided. - Vascular surgery consult will be obtained. - Aspirin and Plavix will be resumed. - Dr. Delana Meyer was notified about the patient. - We will continue IV heparin as recommended.  2.  Left response with hyperglycemia. - The patient will be placed on supplemental coverage with NovoLog with highly resistant protocol and frequent fingerstick blood glucose measures. - We will hold off metformin and continue his basal coverage.  3.  Essential hypertension. - We will continue Lopressor.  4.  Dyslipidemia. - We will continue statin therapy.  5.  Peripheral neuropathy. - We will continue Neurontin.  6.  Ongoing heavy tobacco abuse. - He was counseled for cessation and will receive further counseling here.  DVT prophylaxis: IV heparin.  Code Status: full code.  Family Communication:  The plan of care was discussed in details with the patient (who requested no other family members to be notified.). I answered all questions. The patient agreed to proceed with the above mentioned plan. Further management will depend upon hospital course. Disposition Plan: Back to previous home environment Consults called: Vascular surgery consult. All the records are reviewed and case discussed with ED provider.  Status is: Inpatient  Remains inpatient appropriate because:Ongoing active pain requiring inpatient pain management, Ongoing diagnostic testing  needed not appropriate for outpatient work up, Unsafe d/c plan, IV treatments appropriate due to intensity of illness or inability to take PO and Inpatient level of care appropriate due to severity of illness   Dispo: The patient is from: Home              Anticipated d/c is to: Home              Patient currently is not medically stable to d/c.   Difficult to place patient No   TOTAL TIME TAKING CARE OF THIS PATIENT: 55 minutes.    Christel Mormon M.D on 11/09/2020 at 11:41 PM  [facility]   From 7 PM-7 AM, contact night-coverage [email or web address]  CC: Primary care physician; Richard Reusing, NP

## 2020-11-09 NOTE — ED Provider Notes (Signed)
[facility] Emergency Department Provider Note    Event Date/Time   First MD Initiated Contact with Patient 11/09/20 2300     (approximate)  I have reviewed the triage vital signs and the nursing notes.   HISTORY  Chief Complaint Foot Pain    HPI [name] is a 60 y.o. male patient with extensive history of PAD as well as diabetes intermittent compliance with his medications presents to the ER for worsening right foot pain.  Having worsening rest pain was initially intermittent now it is constant.  Rates it as mild to moderate.  States he has developed new redness.  No measured fevers.    Past Medical History:  Diagnosis Date  . Arthritis   . Blood clot in vein   . Diabetes mellitus   . DVT (deep venous thrombosis) ([place])   . Hypertension   . Neuropathy   . Type 2 diabetes mellitus with diabetic neuropathy, unspecified ([place]) 02/18/2020   Family History  Problem Relation Age of Onset  . Heart disease Mother   . Diabetes Father   . Heart disease Father    Past Surgical History:  Procedure Laterality Date  . APPENDECTOMY    . LOWER EXTREMITY ANGIOGRAPHY Right 01/27/2020   Procedure: LOWER EXTREMITY ANGIOGRAPHY;  Surgeon: Algernon Huxley, MD;  Location: [facility];  Service: Cardiovascular;  Laterality: Right;  . VEIN LIGATION AND STRIPPING     of left leg   Patient Active Problem List   Diagnosis Date Noted  . Cellulitis of right foot 11/09/2020  . Ulcer of heel due to diabetes ([place]) 10/08/2020  . Pain of right great toe 10/08/2020  . Ischemic foot 09/29/2020  . Excessive cerumen in ear canal, bilateral 05/05/2020  . History of anxiety 03/11/2020  . Ischemia of lower extremity 01/16/2020  . Needs assistance with community resources 09/11/2019  . Peripheral vascular disease ([place]) 10/23/2018  . Neuropathic pain of ankle, left 01/25/2017  . Adjustment disorder with mixed anxiety and depressed mood 08/27/2016  . MDD (major  depressive disorder) 08/10/2016  . Hypertriglyceridemia 06/30/2016  . Thrombocytopenia, unspecified ([place]) 12/06/2012  . Diabetic neuropathy ([place]) 12/05/2012  . Tobacco use disorder 12/05/2012  . Dyslipidemia 12/05/2012  . Uncontrolled type 2 diabetes mellitus with hyperglycemia, with long-term current use of insulin ([place]) 05/11/2011  . Essential hypertension 05/11/2011      Prior to Admission medications   Medication Sig Start Date End Date Taking? Authorizing Provider  acetaminophen (TYLENOL) 325 MG tablet Take 650 mg by mouth every 6 (six) hours as needed.    Yes [provider]  aspirin 81 MG EC tablet TAKE ONE TABLET BY MOUTH EVERY DAY 05/05/20  Yes Kris Hartmann, NP  Blood Glucose Monitoring Suppl [facility] BLOOD GLUCOSE) w/Device KIT USE AS DIRECTED 08/04/20 08/04/21 Yes   clopidogrel (PLAVIX) 75 MG tablet Take 1 tablet (75 mg total) by mouth daily. 01/27/20  Yes Algernon Huxley, MD  COMFORT EZ PEN NEEDLES 32G X 4 MM MISC USE WITH BASAGLAR AS DIRECTED 08/04/20 08/04/21 Yes   Rightest GL300 Lancets MISC USE AS DIRECTED 08/04/20 08/04/21 Yes   atorvastatin (LIPITOR) 40 MG tablet Take 1 tablet (40 mg total) by mouth daily. Patient not taking: No sig reported 01/27/20   Algernon Huxley, MD  blood glucose meter kit and supplies KIT Dispense based on patient and insurance preference. Use up to four times daily as directed. (FOR ICD-9 250.00, 250.01). Patient not taking: No  sig reported 08/15/19   Iloabachie, Chioma E, NP  busPIRone (BUSPAR) 10 MG tablet TAKE ONE TABLET BY MOUTH 3 TIMES A DAY 06/09/20 12/31/20  Iloabachie, Chioma E, NP  famotidine (PEPCID) 20 MG tablet Take 1 tablet (20 mg total) by mouth 2 (two) times daily. Patient not taking: No sig reported 02/29/20   Paulette Blanch, MD  gabapentin (NEURONTIN) 300 MG capsule TAKE ONE CAPSULE BY MOUTH 3 TIMES A DAY 08/15/20 08/15/21  Vanessa [place], MD  glucose blood (TRUE METRIX BLOOD GLUCOSE TEST) test strip Use as instructed Patient not taking:  No sig reported 01/31/17   Angelica Chessman E, MD  glucose blood test strip USE TO TEST BLOOD GLUCOSE UP TO 4 TIMES PER DAY, IN THE MORNING AND AFTER MEALS 08/04/20 08/04/21    Insulin Glargine (BASAGLAR KWIKPEN) 100 UNIT/ML Inject 24 Units into the skin at bedtime. 10/08/20 10/08/21  Iloabachie, Chioma E, NP  metFORMIN (GLUCOPHAGE) 1000 MG tablet TAKE ONE TABLET BY MOUTH 2 TIMES A DAY WITH A MEAL 10/08/20 10/08/21  Iloabachie, Chioma E, NP  metoprolol tartrate (LOPRESSOR) 50 MG tablet TAKE ONE TABLET BY MOUTH 2 TIMES A DAY 10/08/20 10/08/21  Iloabachie, Chioma E, NP  naproxen sodium (ALEVE) 220 MG tablet Take 220 mg by mouth daily as needed. Patient not taking: No sig reported    [provider]    Allergies Erythromycin base, Xanax [alprazolam], Benzodiazepines, Methocarbamol, and Sulfa antibiotics    Social History Social History   Tobacco Use  . Smoking status: Current Every Day Smoker    Packs/day: 2.00    Years: 6.00    Pack years: 12.00    Types: Cigarettes  . Smokeless tobacco: Never Used  . Tobacco comment: 1.5-2  Vaping Use  . Vaping Use: Never used  Substance Use Topics  . Alcohol use: Not Currently    Alcohol/week: 4.0 standard drinks    Types: 4 Cans of beer per week    Comment: 2 times a week  . Drug use: No    Review of Systems Patient denies headaches, rhinorrhea, blurry vision, numbness, shortness of breath, chest pain, edema, cough, abdominal pain, nausea, vomiting, diarrhea, dysuria, fevers, rashes or hallucinations unless otherwise stated above in HPI. ____________________________________________   PHYSICAL EXAM:  VITAL SIGNS: Vitals:   11/09/20 2135 11/10/20 0019  BP: 129/89 118/78  Pulse: 85 77  Resp: 18 14  Temp: 98.5 F (36.9 C)   SpO2: 96% 94%    Constitutional: Alert and oriented.  Eyes: Conjunctivae are normal.  Head: Atraumatic. Nose: No congestion/rhinnorhea. Mouth/Throat: Mucous membranes are moist.   Neck: No stridor. Painless ROM.   Cardiovascular: Normal rate, regular rhythm. Grossly normal heart sounds.  Good peripheral circulation. Respiratory: Normal respiratory effort.  No retractions. Lungs CTAB. Gastrointestinal: Soft and nontender. No distention. No abdominal bruits. No CVA tenderness. Genitourinary:  Musculoskeletal: Right foot is erythematous delayed cap refill with chronic appearing ulceration of the great toe necrotic center.  Unable to palpate PT DP pulses.  No lower extremity tenderness nor edema.  No joint effusions. Neurologic:  Normal speech and language. No gross focal neurologic deficits are appreciated. No facial droop Skin:  Skin is warm, dry and intact. No rash noted. Psychiatric: Mood and affect are normal. Speech and behavior are normal.  ____________________________________________   LABS (all labs ordered are listed, but only abnormal results are displayed)  Results for orders placed or performed during the hospital encounter of 11/09/20 (from the past 24 hour(s))  Basic metabolic  panel     Status: Abnormal   Collection Time: 11/09/20  9:35 PM  Result Value Ref Range   Sodium 131 (L) 135 - 145 mmol/L   Potassium 4.0 3.5 - 5.1 mmol/L   Chloride 95 (L) 98 - 111 mmol/L   CO2 26 22 - 32 mmol/L   Glucose, Bld 530 (HH) 70 - 99 mg/dL   BUN 13 8 - 23 mg/dL   Creatinine, Ser 0.88 0.61 - 1.24 mg/dL   Calcium 9.4 8.9 - 10.3 mg/dL   GFR, Estimated >60 >60 mL/min   Anion gap 10 5 - 15  CBC     Status: Abnormal   Collection Time: 11/09/20  9:35 PM  Result Value Ref Range   WBC 6.7 4.0 - 10.5 K/uL   RBC 5.29 4.22 - 5.81 MIL/uL   Hemoglobin 17.1 (H) 13.0 - 17.0 g/dL   HCT 48.6 39.0 - 52.0 %   MCV 91.9 80.0 - 100.0 fL   MCH 32.3 26.0 - 34.0 pg   MCHC 35.2 30.0 - 36.0 g/dL   RDW 11.9 11.5 - 15.5 %   Platelets 129 (L) 150 - 400 K/uL   nRBC 0.0 0.0 - 0.2 %   ____________________________________________  EKG My review and personal interpretation at Time: 0:04   Indication: leg pain  Rate: 75   Rhythm: sinus Axis: normal Other: normal intervals, no stemi ____________________________________________  RADIOLOGY  I personally reviewed all radiographic images ordered to evaluate for the above acute complaints and reviewed radiology reports and findings.  These findings were personally discussed with the patient.  Please see medical record for radiology report.  ____________________________________________   PROCEDURES  Procedure(s) performed:  Procedures    Critical Care performed: no ____________________________________________   INITIAL IMPRESSION / ASSESSMENT AND PLAN / ED COURSE  Pertinent labs & imaging results that were available during my care of the patient were reviewed by me and considered in my medical decision making (see chart for details).   DDX: pad, rest pain, cellulitis, ulcer  [name] is a 60 y.o. who presents to the ED with symptoms as described above.  Patient with evidence of significant PAD and worsening rest pain.  Having worsening pain erythema to right foot.  Poorly controlled diabetic with glucose of 500.  Certainly concerning for some acute on chronic ischemia of the right foot.  Will heparinize.  Will give antibiotics given the erythema and as he is a diabetic.  Will discuss with hospitalist for admission.  Will notify vascular surgery and plan to heparinize.  Unfortunately it appears on review of his record the patient will frequently leave AMA and has not had formal angiography of the right leg.  I informed the patient of the importance of this work-up as the patient is at high risk for additional disability secondary to ischemia to his right leg.     The patient was evaluated in Emergency Department today for the symptoms described in the history of present illness. He/she was evaluated in the context of the global COVID-19 pandemic, which necessitated consideration that the patient might be at risk for infection with the SARS-CoV-2 virus  that causes COVID-19. Institutional protocols and algorithms that pertain to the evaluation of patients at risk for COVID-19 are in a state of rapid change based on information released by regulatory bodies including the CDC and federal and state organizations. These policies and algorithms were followed during the patient's care in the ED.  As part of my medical decision  making, I reviewed the following data within the [place] notes reviewed and incorporated, Labs reviewed, notes from prior ED visits and [place] Controlled Substance Database   ____________________________________________   FINAL CLINICAL IMPRESSION(S) / ED DIAGNOSES  Final diagnoses:  Cellulitis of right foot  Claudication of right lower extremity ([place])  Hyperglycemia      NEW MEDICATIONS STARTED DURING THIS VISIT:  New Prescriptions   No medications on file     Note:  This document was prepared using Dragon voice recognition software and may include unintentional dictation errors.    Merlyn Lot, MD 11/10/20 [id number]

## 2020-11-09 NOTE — ED Triage Notes (Signed)
Pt has 2 ulcers to right foot.  1 to right heel and right great toe.  Hx diabetes.  Pt has increased pain.  Redness to right foot.  Pt alert.

## 2020-11-09 NOTE — Progress Notes (Signed)
PHARMACY -  BRIEF ANTIBIOTIC NOTE   Pharmacy has received consult(s) for Vancomycin from an ED provider.  The patient's profile has been reviewed for ht/wt/allergies/indication/available labs.    One time order(s) placed for Vancomycin 2 gm IV X 1   Further antibiotics/pharmacy consults should be ordered by admitting physician if indicated.                       Thank you, Samrat Hayward D 11/09/2020  11:34 PM

## 2020-11-09 NOTE — Progress Notes (Signed)
ANTICOAGULATION CONSULT NOTE - Initial Consult  Pharmacy Consult for Heparin  Indication: PAD   Allergies  Allergen Reactions  . Erythromycin Base Anxiety  . Xanax [Alprazolam] Other (See Comments)    Tachycardia  . Benzodiazepines     PT STATES HE CANNOT TAKE THESE BECAUSE THEY ALMOST KILLED HIM  . Methocarbamol Anxiety and Other (See Comments)    Stomach cramping, weakness  . Sulfa Antibiotics Nausea And Vomiting and Other (See Comments)    Cramping in stomach and hyperventilate.     Patient Measurements: Height: 5\' 11"  (180.3 cm) Weight: 88.5 kg (195 lb) IBW/kg (Calculated) : 75.3 Heparin Dosing Weight: 88.5   Vital Signs: Temp: 98.5 F (36.9 C) (05/09 2135) Temp Source: Oral (05/09 2135) BP: 129/89 (05/09 2135) Pulse Rate: 85 (05/09 2135)  Labs: Recent Labs    11/09/20 2135  HGB 17.1*  HCT 48.6  PLT 129*  CREATININE 0.88    Estimated Creatinine Clearance: 93.9 mL/min (by C-G formula based on SCr of 0.88 mg/dL).   Medical History: Past Medical History:  Diagnosis Date  . Arthritis   . Blood clot in vein   . Diabetes mellitus   . DVT (deep venous thrombosis) ([facility])   . Hypertension   . Neuropathy   . Type 2 diabetes mellitus with diabetic neuropathy, unspecified ([facility]) 02/18/2020    Medications:  (Not in a hospital admission)   Assessment: Pharmacy consulted to dose heparin in this 60 year old male admitted with PAD.   CrCl = 93.9 ml/min No prior anticoag noted.   Goal of Therapy:  Heparin level 0.3-0.7 units/ml Monitor platelets by anticoagulation protocol: Yes   Plan:  Give 5000 units bolus x 1 Start heparin infusion at 1500 units/hr Check anti-Xa level in 6 hours and daily while on heparin Continue to monitor H&H and platelets  Dohn Stclair D 11/09/2020,11:28 PM

## 2020-11-10 ENCOUNTER — Encounter: Payer: Self-pay | Admitting: Anesthesiology

## 2020-11-10 ENCOUNTER — Encounter
Admission: EM | Discharge status: Against Medical Advice | Payer: Self-pay | Source: Home / Self Care | Attending: Student in an Organized Health Care Education/Training Program

## 2020-11-10 ENCOUNTER — Other Ambulatory Visit (INDEPENDENT_AMBULATORY_CARE_PROVIDER_SITE_OTHER): Payer: Self-pay | Admitting: Vascular Surgery

## 2020-11-10 DIAGNOSIS — L03115 Cellulitis of right lower limb: Secondary | ICD-10-CM | POA: Diagnosis not present

## 2020-11-10 DIAGNOSIS — Z716 Tobacco abuse counseling: Secondary | ICD-10-CM

## 2020-11-10 DIAGNOSIS — E1165 Type 2 diabetes mellitus with hyperglycemia: Secondary | ICD-10-CM

## 2020-11-10 DIAGNOSIS — R739 Hyperglycemia, unspecified: Secondary | ICD-10-CM

## 2020-11-10 DIAGNOSIS — E1151 Type 2 diabetes mellitus with diabetic peripheral angiopathy without gangrene: Secondary | ICD-10-CM

## 2020-11-10 DIAGNOSIS — I739 Peripheral vascular disease, unspecified: Secondary | ICD-10-CM

## 2020-11-10 LAB — CBC
HCT: 46.4 % (ref 39.0–52.0)
Hemoglobin: 16.6 g/dL (ref 13.0–17.0)
MCH: 32.9 pg (ref 26.0–34.0)
MCHC: 35.8 g/dL (ref 30.0–36.0)
MCV: 92.1 fL (ref 80.0–100.0)
Platelets: 129 10*3/uL — ABNORMAL LOW (ref 150–400)
RBC: 5.04 MIL/uL (ref 4.22–5.81)
RDW: 11.9 % (ref 11.5–15.5)
WBC: 7.1 10*3/uL (ref 4.0–10.5)
nRBC: 0 % (ref 0.0–0.2)

## 2020-11-10 LAB — HEPARIN LEVEL (UNFRACTIONATED)
Heparin Unfractionated: 0.36 IU/mL (ref 0.30–0.70)
Heparin Unfractionated: 0.46 IU/mL (ref 0.30–0.70)

## 2020-11-10 LAB — BASIC METABOLIC PANEL
Anion gap: 8 (ref 5–15)
BUN: 13 mg/dL (ref 8–23)
CO2: 23 mmol/L (ref 22–32)
Calcium: 8.8 mg/dL — ABNORMAL LOW (ref 8.9–10.3)
Chloride: 106 mmol/L (ref 98–111)
Creatinine, Ser: 0.66 mg/dL (ref 0.61–1.24)
GFR, Estimated: >60 mL/min (ref >60)
Glucose, Bld: 107 mg/dL — ABNORMAL HIGH (ref 70–99)
Potassium: 3.6 mmol/L (ref 3.5–5.1)
Sodium: 137 mmol/L (ref 135–145)

## 2020-11-10 LAB — CBG MONITORING, ED: Glucose-Capillary: 411 mg/dL — ABNORMAL HIGH (ref 70–99)

## 2020-11-10 LAB — RESP PANEL BY RT-PCR (FLU A&B, COVID) ARPGX2
Influenza A by PCR: NEGATIVE
Influenza B by PCR: NEGATIVE
SARS Coronavirus 2 by RT PCR: NEGATIVE

## 2020-11-10 LAB — PROTIME-INR
INR: 0.9 (ref 0.8–1.2)
Prothrombin Time: 12.4 seconds (ref 11.4–15.2)

## 2020-11-10 LAB — APTT: aPTT: 28 seconds (ref 24–36)

## 2020-11-10 LAB — LACTIC ACID, PLASMA: Lactic Acid, Venous: 3.1 mmol/L (ref 0.5–1.9)

## 2020-11-10 LAB — GLUCOSE, CAPILLARY
Glucose-Capillary: 252 mg/dL — ABNORMAL HIGH (ref 70–99)
Glucose-Capillary: 98 mg/dL (ref 70–99)

## 2020-11-10 SURGERY — LOWER EXTREMITY ANGIOGRAPHY
Anesthesia: Moderate Sedation | Laterality: Right

## 2020-11-10 MED ORDER — MAGNESIUM HYDROXIDE 400 MG/5ML PO SUSP: 30.0000 mL | Freq: Every day | ORAL | Status: DC | PRN

## 2020-11-10 MED ORDER — INSULIN GLARGINE 100 UNIT/ML ~~LOC~~ SOLN
24.0000 [IU] | Freq: Every day | SUBCUTANEOUS | Status: DC
  Administered 2020-11-10: 24 [IU] via SUBCUTANEOUS
  Filled 2020-11-10 (×2): qty 0.24

## 2020-11-10 MED ORDER — SODIUM CHLORIDE 0.9 % IV SOLN
2.0000 g | Freq: Three times a day (TID) | INTRAVENOUS | Status: DC
  Administered 2020-11-10 (×2): 2 g via INTRAVENOUS
  Filled 2020-11-10 (×5): qty 2

## 2020-11-10 MED ORDER — INSULIN ASPART 100 UNIT/ML IJ SOLN: 0.0000 [IU] | Freq: Every day | INTRAMUSCULAR | Status: DC

## 2020-11-10 MED ORDER — VANCOMYCIN HCL IN DEXTROSE 1-5 GM/200ML-% IV SOLN: 1000.0000 mg | Freq: Once | INTRAVENOUS | Status: DC

## 2020-11-10 MED ORDER — HYDROMORPHONE HCL 1 MG/ML IJ SOLN
0.5000 mg | INTRAMUSCULAR | Status: DC | PRN
  Administered 2020-11-10: 0.5 mg via INTRAVENOUS
  Filled 2020-11-10: qty 1

## 2020-11-10 MED ORDER — TRAMADOL HCL 50 MG PO TABS: 50.0000 mg | ORAL_TABLET | Freq: Four times a day (QID) | ORAL | Status: DC | PRN

## 2020-11-10 MED ORDER — INSULIN ASPART 100 UNIT/ML IJ SOLN: 0.0000 [IU] | Freq: Three times a day (TID) | INTRAMUSCULAR | Status: DC

## 2020-11-10 MED ORDER — ONDANSETRON HCL 4 MG/2ML IJ SOLN: 4.0000 mg | Freq: Four times a day (QID) | INTRAMUSCULAR | Status: DC | PRN

## 2020-11-10 MED ORDER — SODIUM CHLORIDE 0.9 % IV SOLN: 2.0000 g | Freq: Once | INTRAVENOUS | Status: DC

## 2020-11-10 MED ORDER — GABAPENTIN 100 MG PO CAPS
100.0000 mg | ORAL_CAPSULE | Freq: Three times a day (TID) | ORAL | Status: DC
  Administered 2020-11-10: 100 mg via ORAL
  Filled 2020-11-10 (×2): qty 1

## 2020-11-10 MED ORDER — TRAZODONE HCL 50 MG PO TABS: 25.0000 mg | ORAL_TABLET | Freq: Every evening | ORAL | Status: DC | PRN

## 2020-11-10 MED ORDER — CEFAZOLIN SODIUM-DEXTROSE 2-4 GM/100ML-% IV SOLN
2.0000 g | INTRAVENOUS | Status: DC
  Filled 2020-11-10: qty 100

## 2020-11-10 MED ORDER — BUSPIRONE HCL 10 MG PO TABS
10.0000 mg | ORAL_TABLET | Freq: Three times a day (TID) | ORAL | Status: DC
  Administered 2020-11-10: 10 mg via ORAL
  Filled 2020-11-10: qty 1

## 2020-11-10 MED ORDER — METOPROLOL TARTRATE 50 MG PO TABS
50.0000 mg | ORAL_TABLET | Freq: Two times a day (BID) | ORAL | Status: DC
  Administered 2020-11-10: 50 mg via ORAL
  Filled 2020-11-10 (×2): qty 1

## 2020-11-10 MED ORDER — VANCOMYCIN HCL 1250 MG/250ML IV SOLN
1250.0000 mg | Freq: Two times a day (BID) | INTRAVENOUS | Status: DC
  Filled 2020-11-10 (×2): qty 250

## 2020-11-10 MED ORDER — ASPIRIN EC 81 MG PO TBEC
81.0000 mg | DELAYED_RELEASE_TABLET | Freq: Every day | ORAL | Status: DC
  Administered 2020-11-10: 81 mg via ORAL
  Filled 2020-11-10: qty 1

## 2020-11-10 MED ORDER — SODIUM CHLORIDE 0.9 % IV SOLN: INTRAVENOUS | Status: DC

## 2020-11-10 MED ORDER — ACETAMINOPHEN 325 MG PO TABS
650.0000 mg | ORAL_TABLET | Freq: Four times a day (QID) | ORAL | Status: DC | PRN
Start: 2020-11-10 — End: 2020-11-10

## 2020-11-10 MED ORDER — MORPHINE SULFATE (PF) 2 MG/ML IV SOLN: 2.0000 mg | INTRAVENOUS | Status: DC | PRN

## 2020-11-10 MED ORDER — ONDANSETRON HCL 4 MG PO TABS
4.0000 mg | ORAL_TABLET | Freq: Four times a day (QID) | ORAL | Status: DC | PRN
Start: 2020-11-10 — End: 2020-11-10

## 2020-11-10 MED ORDER — ACETAMINOPHEN 650 MG RE SUPP: 650.0000 mg | Freq: Four times a day (QID) | RECTAL | Status: DC | PRN

## 2020-11-10 NOTE — Progress Notes (Signed)
[facility]  - [place] [facility]   PATIENT NAME: [name]    MR#:  [id number]  DATE OF BIRTH:  1961-05-26  SUBJECTIVE:  comes in with right foot pain. Has been noncompliant with follow-up and meds. Continues to smoke plan to get lower extremity angiogram later this afternoon  REVIEW OF SYSTEMS:   Review of Systems  Constitutional: Negative for chills, fever and weight loss.  HENT: Negative for ear discharge, ear pain and nosebleeds.   Eyes: Negative for blurred vision, pain and discharge.  Respiratory: Negative for sputum production, shortness of breath, wheezing and stridor.   Cardiovascular: Negative for chest pain, palpitations, orthopnea and PND.  Gastrointestinal: Negative for abdominal pain, diarrhea, nausea and vomiting.  Genitourinary: Negative for frequency and urgency.  Musculoskeletal: Positive for joint pain. Negative for back pain.  Neurological: Negative for sensory change, speech change, focal weakness and weakness.  Psychiatric/Behavioral: Negative for depression and hallucinations. The patient is not nervous/anxious.    Tolerating Diet:npo Tolerating PT:   DRUG ALLERGIES:   Allergies  Allergen Reactions  . Erythromycin Base Anxiety  . Xanax [Alprazolam] Other (See Comments)    Tachycardia  . Benzodiazepines     PT STATES HE CANNOT TAKE THESE BECAUSE THEY ALMOST KILLED HIM  . Methocarbamol Anxiety and Other (See Comments)    Stomach cramping, weakness  . Sulfa Antibiotics Nausea And Vomiting and Other (See Comments)    Cramping in stomach and hyperventilate.     VITALS:  Blood pressure 139/74, pulse 70, temperature 98.3 F (36.8 C), temperature source Oral, resp. rate 16, height 5\' 11"  (1.803 m), weight 88.5 kg, SpO2 95 %.  PHYSICAL EXAMINATION:   Physical Exam  GENERAL:  60 y.o.-year-old patient lying in the bed with no acute distress.  LUNGS: Normal breath sounds bilaterally, no wheezing, rales, rhonchi. No use of accessory  muscles of respiration.  CARDIOVASCULAR: S1, S2 normal. No murmurs, rubs, or gallops.  ABDOMEN: Soft, nontender, nondistended. Bowel sounds present. No organomegaly or mass.  EXTREMITIES:right foot       NEUROLOGIC: Cranial nerves II through XII are intact. No focal Motor or sensory deficits b/l.   PSYCHIATRIC:  patient is alert and oriented x 3.  SKIN: as above  LABORATORY PANEL:  CBC Recent Labs  Lab 11/10/20 0751  WBC 7.1  HGB 16.6  HCT 46.4  PLT 129*    Chemistries  Recent Labs  Lab 11/10/20 0751  NA 137  K 3.6  CL 106  CO2 23  GLUCOSE 107*  BUN 13  CREATININE 0.66  CALCIUM 8.8*   Cardiac Enzymes No results for input(s): TROPONINI in the last 168 hours. RADIOLOGY:  DG Foot Complete Right  Result Date: 11/09/2020 CLINICAL DATA:  Right foot ulcer and pain, initial encounter EXAM: RIGHT FOOT COMPLETE - 3+ VIEW COMPARISON:  09/15/2020, 08/15/2020 FINDINGS: Mild calcaneal spurring is again seen. No acute fracture or dislocation is noted. No bony erosive changes are seen. Previously noted densities in the soft tissues near the head of the metatarsals are no longer identified and were likely extrinsic to the patient. IMPRESSION: No findings to suggest osteomyelitis are noted. Mild calcaneal spurring is noted. Electronically Signed   By: 10/13/2020 M.D.   On: 11/09/2020 23:44   ASSESSMENT AND PLAN:  [name] is a 60 y.o. Caucasian male with medical history significant for type II diabetes mellitus, PAD, hypertension, peripheral neuropathy and DVT, who presented to the ER with acute onset of right foot erythema,  swelling, pain and tenderness. Pt reports as of now he is homeless!  Peripheral arterial disease severe with his company cultures and nonpurulent cellulitis right foot tobacco abuse -- continue IV cefepime-- de-escalate to oral antibiotics -- PRN pain meds -- continue aspirin- Plavix (it appears patient has been noncompliant) -- IV heparin drip -- Dr.  Gilda Crease plans to do angiogram lower extremity -- counsel smoking cessation patient does not appear to be motivated.  Type II diabetes with peripheral arterial disease -- continue Lantus and sliding scale  Hypertension -- continue Lopressor  Dyslipidemia -- statin  Peripheral neuropathy -- on Neurontin   Procedures: Family communication : none Consults : vascular CODE STATUS: full DVT Prophylaxis : heparin drip Level of care: Med-Surg Status is: Inpatient  Remains inpatient appropriate because:Inpatient level of care appropriate due to severity of illness   Dispo: The patient is from: Home              Anticipated d/c is to: possible homeless shelter              Patient currently is not medically stable to d/c.   Difficult to place patient No  TOC for discharge planning-- patient tells me he has nowhere to go.      TOTAL TIME TAKING CARE OF THIS PATIENT: 25 minutes.  >50% time spent on counselling and coordination of care  Note: This dictation was prepared with Dragon dictation along with smaller phrase technology. Any transcriptional errors that result from this process are unintentional.  Enedina Finner M.D    [facility]   CC: Primary care physician; Rolm Gala, NPPatient ID: [name], male   DOB: 1960-07-24, 60 y.o.   MRN: [id number]

## 2020-11-10 NOTE — Progress Notes (Addendum)
Inpatient Diabetes Program Recommendations  AACE/ADA: New Consensus Statement on Inpatient Glycemic Control   Target Ranges:  Prepandial:   less than 140 mg/dL      Peak postprandial:   less than 180 mg/dL (1-2 hours)      Critically ill patients:  140 - 180 mg/dL   Results for [name], [name] (MRN [id number]) as of 11/10/2020 06:51  Ref. Range 11/10/2020 00:32 11/10/2020 01:49  Glucose-Capillary Latest Ref Range: 70 - 99 mg/dL 509 (H) 326 (H)  Results for [name], [name] (MRN [id number]) as of 11/10/2020 06:51  Ref. Range 11/09/2020 21:35  Glucose Latest Ref Range: 70 - 99 mg/dL [facility])  Results for [name], [name] (MRN [id number]) as of 11/10/2020 06:51  Ref. Range 08/04/2020 22:12 09/30/2020 08:19  Hemoglobin A1C Latest Ref Range: 4.8 - 5.6 % 13.5 (A) 12.2 (H)   Review of Glycemic Control  Diabetes history: DM2 Outpatient Diabetes medications: Basaglar 24 units QHS, Metformin 1000 mg BID Current orders for Inpatient glycemic control: Lantus 24 units QHS  Inpatient Diabetes Program Recommendations:    Insulin: Please consider ordering CBGs Q4H with Novolog 0-9 units Q4H (once diet is ordered could change to AC&HS).  NOTE: In reviewing chart, noted patient seen C. Ilobachie, NP on 10/08/20 and per office note "HgbA1c done on 09/30/20 decreased from 13.5% to 12.2%. He reports that he has been taking 12 units Basaglar tid and takes his rarely take his Metformin. His not consistent with checking his blood glucose." Patient was asked to increase Basaglar to 24 units QHS and take Metformin 1000 mg BID.   Addendum 11/10/20@12 :15-Spoke with patient about diabetes and home regimen for diabetes control. Patient reports being followed by [facility] for diabetes management and states he gets [facility]. Patient reports that he is taking Basaglar 24 units QPM and Metformin 1000 mg BID as an outpatient for diabetes control. Patient reports that he has one insulin pen left  and needs refill for Basaglar pens and Metformin. Patient states he is not checking his glucose because he is homeless and he doesn't want to carry around a glucometer out on the streets.  Tried to talk to patient about DM control and A1C but patient kept getting off track and focused on his foot and being homeless and not having anywhere to go when he gets discharged. Patient states that he does not have any money, no monthly income, and only has food stamps.  Patient states he can not live out on the streets in the current temperatures because it makes his foot hurt so badly when it is cold.  Patient reports that he cannot go to homeless shelter in [place] because it is so bad there and he refuses to go back there.  Informed patient that TOC would be consulted regarding being homeless to discuss resources. Tried again to talk with patient about DM but he says he is most concerned about his foot and what will be done about it and where he is going when he gets discharged from the hospital. Again told patient I would consult TOC. Informed patient that it would be requested that he be given Rx for Basaglar pens and Metformin at discharge if continued. Patient verbalized understanding and reports no further questions at this time related to diabetes but wants to know where he is going to go once discharged from the hosptial.   Thanks, Orlando Penner, RN, MSN, CDE Diabetes Coordinator Inpatient Diabetes Program [phone] (Team Pager  from 8am to 5pm)

## 2020-11-10 NOTE — Progress Notes (Signed)
ANTICOAGULATION CONSULT NOTE - Initial Consult  Pharmacy Consult for Heparin  Indication: PAD   Allergies  Allergen Reactions  . Erythromycin Base Anxiety  . Xanax [Alprazolam] Other (See Comments)    Tachycardia  . Benzodiazepines     PT STATES HE CANNOT TAKE THESE BECAUSE THEY ALMOST KILLED HIM  . Methocarbamol Anxiety and Other (See Comments)    Stomach cramping, weakness  . Sulfa Antibiotics Nausea And Vomiting and Other (See Comments)    Cramping in stomach and hyperventilate.     Patient Measurements: Height: '5\' 11"'  (180.3 cm) Weight: 88.5 kg (195 lb) IBW/kg (Calculated) : 75.3 Heparin Dosing Weight: 88.5   Vital Signs: Temp: 98.3 F (36.8 C) (05/10 0725) Temp Source: Oral (05/10 0725) BP: 139/74 (05/10 0725) Pulse Rate: 70 (05/10 0725)  Labs: Recent Labs    11/09/20 2135 11/09/20 2359 11/10/20 0751  HGB 17.1*  --  16.6  HCT 48.6  --  46.4  PLT 129*  --  129*  APTT  --  28  --   LABPROT  --  12.4  --   INR  --  0.9  --   HEPARINUNFRC  --   --  0.36  CREATININE 0.88  --  0.66    Estimated Creatinine Clearance: 103.3 mL/min (by C-G formula based on SCr of 0.66 mg/dL).   Medical History: Past Medical History:  Diagnosis Date  . Arthritis   . Blood clot in vein   . Diabetes mellitus   . DVT (deep venous thrombosis) ([place])   . Hypertension   . Neuropathy   . Type 2 diabetes mellitus with diabetic neuropathy, unspecified ([place]) 02/18/2020    Medications:  Medications Prior to Admission  Medication Sig Dispense Refill Last Dose  . acetaminophen (TYLENOL) 325 MG tablet Take 650 mg by mouth every 6 (six) hours as needed.    prn at prn  . aspirin 81 MG EC tablet TAKE ONE TABLET BY MOUTH EVERY DAY 90 tablet 0 unknown at unknown  . atorvastatin (LIPITOR) 40 MG tablet Take 1 tablet (40 mg total) by mouth daily. 30 tablet 3 unknown at unknown  . Blood Glucose Monitoring Suppl (RIGHTEST GM550 BLOOD GLUCOSE) w/Device KIT USE AS DIRECTED 1 kit 1 unknown at  unknown  . busPIRone (BUSPAR) 10 MG tablet TAKE ONE TABLET BY MOUTH 3 TIMES A DAY 90 tablet 0 unknown at unknown  . clopidogrel (PLAVIX) 75 MG tablet Take 1 tablet (75 mg total) by mouth daily. 30 tablet 11 unknown at unknown  . COMFORT EZ PEN NEEDLES 32G X 4 MM MISC USE WITH BASAGLAR AS DIRECTED 100 each 11 unknown at unknown  . famotidine (PEPCID) 20 MG tablet Take 1 tablet (20 mg total) by mouth 2 (two) times daily. 8 tablet 0 unknown at unknown  . gabapentin (NEURONTIN) 300 MG capsule TAKE ONE CAPSULE BY MOUTH 3 TIMES A DAY (Patient taking differently: Take 300 mg by mouth 3 (three) times daily.) 90 capsule 2 unknonw at unknown  . glucose blood test strip USE TO TEST BLOOD GLUCOSE UP TO 4 TIMES PER DAY, IN THE MORNING AND AFTER MEALS (Patient taking differently: USE TO TEST BLOOD GLUCOSE UP TO 4 TIMES PER DAY, IN THE MORNING AND AFTER MEALS) 100 strip 11 unknonw  . Insulin Glargine (BASAGLAR KWIKPEN) 100 UNIT/ML Inject 24 Units into the skin at bedtime. 15 mL 3 unknown at unknown  . metFORMIN (GLUCOPHAGE) 1000 MG tablet TAKE ONE TABLET BY MOUTH 2 TIMES A DAY WITH A  MEAL 60 tablet 2 unknown at unknown  . metoprolol tartrate (LOPRESSOR) 50 MG tablet TAKE ONE TABLET BY MOUTH 2 TIMES A DAY 60 tablet 2 unknown at unknown  . Rightest GL300 Lancets MISC USE AS DIRECTED 100 each 11 unknown at unknown  . glucose blood (TRUE METRIX BLOOD GLUCOSE TEST) test strip Use as instructed 100 each 12 unknown at unknown  . naproxen sodium (ALEVE) 220 MG tablet Take 220 mg by mouth daily as needed. (Patient not taking: No sig reported)   Not Taking at Unknown time    Assessment: Pharmacy consulted to dose heparin in this 61 year old male admitted with PAD.   CrCl = 93.9 ml/min  No prior anticoag noted.   Pt given 5000 units bolus x 1, heparin infusion started at 1500 units/hr  5/10 @ 0751 HL 0.36 @ 1500 units/hr - therapeutic x 1  Goal of Therapy:  Heparin level 0.3-0.7 units/ml Monitor platelets by  anticoagulation protocol: Yes   Plan:  Therapeutic - will continue heparin @ 1500 units/hr and recheck HL in 6 hours per protocol for confirmation.  Continue to monitor H&H and platelets  Lu Duffel, PharmD, BCPS Clinical Pharmacist 11/10/2020 8:39 AM

## 2020-11-10 NOTE — ED Notes (Signed)
2 sets blood cultures drawn, labeled with temp label, and sent down to lab prior to start of abx

## 2020-11-10 NOTE — TOC Progression Note (Signed)
Transition of Care [facility]) - Progression Note    Patient Details  Name: [name] MRN: [id number] Date of Birth: 1960/11/12  [facility] CM/SW Contact  Barrie Dunker, RN Phone Number: 11/10/2020, 2:13 PM  Clinical Narrative:    Attempted to reach the patient to discuss DC plan and needs He was not available , will make another attemptt again, notified the nurse to have him call me when he is available as well       Expected Discharge Plan and Services                                                 Social Determinants of Health (SDOH) Interventions    Readmission Risk Interventions No flowsheet data found.

## 2020-11-10 NOTE — Progress Notes (Signed)
Patient bed alarm went off as pt was out of bed attempting to get dressed.  Pt upset stating, "I'm leaving if they can't find anywhere for me to stay.  I can't have this procedure if I don't have anywhere to stay afterwards."  MD, Charge RN and Case Manager informed pt wants to leave AMA.  AMA form signed and IV's taken out.  Pt left unit at this time.

## 2020-11-10 NOTE — Plan of Care (Signed)
  Problem: Education: Goal: Knowledge of General Education information will improve Description: Including pain rating scale, medication(s)/side effects and non-pharmacologic comfort measures 11/10/2020 1548 by Conard Novak, RN Outcome: Not Applicable 7/89/3810 1751 by Conard Novak, RN Outcome: Not Met (add Reason)   Problem: Pain Managment: Goal: General experience of comfort will improve 11/10/2020 1548 by Conard Novak, RN Outcome: Not Applicable 0/25/8527 7824 by Conard Novak, RN Outcome: Not Met (add Reason)   Problem: Safety: Goal: Ability to remain free from injury will improve 11/10/2020 1548 by Conard Novak, RN Outcome: Not Applicable 2/35/3614 4315 by Conard Novak, RN Outcome: Not Met (add Reason)   Problem: Skin Integrity: Goal: Risk for impaired skin integrity will decrease 11/10/2020 1548 by Conard Novak, RN Outcome: Not Applicable 4/00/8676 1950 by Conard Novak, RN Outcome: Not Met (add Reason)

## 2020-11-10 NOTE — Progress Notes (Signed)
ANTICOAGULATION CONSULT NOTE - Initial Consult  Pharmacy Consult for Heparin  Indication: PAD   Allergies  Allergen Reactions  . Erythromycin Base Anxiety  . Xanax [Alprazolam] Other (See Comments)    Tachycardia  . Benzodiazepines     PT STATES HE CANNOT TAKE THESE BECAUSE THEY ALMOST KILLED HIM  . Methocarbamol Anxiety and Other (See Comments)    Stomach cramping, weakness  . Sulfa Antibiotics Nausea And Vomiting and Other (See Comments)    Cramping in stomach and hyperventilate.     Patient Measurements: Height: '5\' 11"'  (180.3 cm) Weight: 88.5 kg (195 lb) IBW/kg (Calculated) : 75.3 Heparin Dosing Weight: 88.5   Vital Signs: Temp: 98.3 F (36.8 C) (05/10 0725) Temp Source: Oral (05/10 0725) BP: 139/74 (05/10 0725) Pulse Rate: 70 (05/10 0725)  Labs: Recent Labs    11/09/20 2135 11/09/20 2359 11/10/20 0751 11/10/20 1402  HGB 17.1*  --  16.6  --   HCT 48.6  --  46.4  --   PLT 129*  --  129*  --   APTT  --  28  --   --   LABPROT  --  12.4  --   --   INR  --  0.9  --   --   HEPARINUNFRC  --   --  0.36 0.46  CREATININE 0.88  --  0.66  --     Estimated Creatinine Clearance: 103.3 mL/min (by C-G formula based on SCr of 0.66 mg/dL).   Medical History: Past Medical History:  Diagnosis Date  . Arthritis   . Blood clot in vein   . Diabetes mellitus   . DVT (deep venous thrombosis) ([place])   . Hypertension   . Neuropathy   . Type 2 diabetes mellitus with diabetic neuropathy, unspecified ([place]) 02/18/2020    Medications:  Medications Prior to Admission  Medication Sig Dispense Refill Last Dose  . acetaminophen (TYLENOL) 325 MG tablet Take 650 mg by mouth every 6 (six) hours as needed.    prn at prn  . aspirin 81 MG EC tablet TAKE ONE TABLET BY MOUTH EVERY DAY 90 tablet 0 unknown at unknown  . atorvastatin (LIPITOR) 40 MG tablet Take 1 tablet (40 mg total) by mouth daily. 30 tablet 3 unknown at unknown  . Blood Glucose Monitoring Suppl (RIGHTEST GM550 BLOOD GLUCOSE)  w/Device KIT USE AS DIRECTED 1 kit 1 unknown at unknown  . busPIRone (BUSPAR) 10 MG tablet TAKE ONE TABLET BY MOUTH 3 TIMES A DAY 90 tablet 0 unknown at unknown  . clopidogrel (PLAVIX) 75 MG tablet Take 1 tablet (75 mg total) by mouth daily. 30 tablet 11 unknown at unknown  . COMFORT EZ PEN NEEDLES 32G X 4 MM MISC USE WITH BASAGLAR AS DIRECTED 100 each 11 unknown at unknown  . famotidine (PEPCID) 20 MG tablet Take 1 tablet (20 mg total) by mouth 2 (two) times daily. 8 tablet 0 unknown at unknown  . gabapentin (NEURONTIN) 300 MG capsule TAKE ONE CAPSULE BY MOUTH 3 TIMES A DAY (Patient taking differently: Take 300 mg by mouth 3 (three) times daily.) 90 capsule 2 unknonw at unknown  . glucose blood test strip USE TO TEST BLOOD GLUCOSE UP TO 4 TIMES PER DAY, IN THE MORNING AND AFTER MEALS (Patient taking differently: USE TO TEST BLOOD GLUCOSE UP TO 4 TIMES PER DAY, IN THE MORNING AND AFTER MEALS) 100 strip 11 unknonw  . Insulin Glargine (BASAGLAR KWIKPEN) 100 UNIT/ML Inject 24 Units into the skin at bedtime.  15 mL 3 unknown at unknown  . metFORMIN (GLUCOPHAGE) 1000 MG tablet TAKE ONE TABLET BY MOUTH 2 TIMES A DAY WITH A MEAL 60 tablet 2 unknown at unknown  . metoprolol tartrate (LOPRESSOR) 50 MG tablet TAKE ONE TABLET BY MOUTH 2 TIMES A DAY 60 tablet 2 unknown at unknown  . Rightest GL300 Lancets MISC USE AS DIRECTED 100 each 11 unknown at unknown  . glucose blood (TRUE METRIX BLOOD GLUCOSE TEST) test strip Use as instructed 100 each 12 unknown at unknown  . naproxen sodium (ALEVE) 220 MG tablet Take 220 mg by mouth daily as needed. (Patient not taking: No sig reported)   Not Taking at Unknown time    Assessment: Pharmacy consulted to dose heparin in this 60 year old male admitted with PAD.   CrCl = 93.9 ml/min  No prior anticoag noted.   Pt given 5000 units bolus x 1, heparin infusion started at 1500 units/hr  5/10 @ 0751 HL 0.36 @ 1500 units/hr - therapeutic x 1  5/10 @ 1402 HL 0.46 @ 1500  units/hr - therapeutic x 2  Goal of Therapy:  Heparin level 0.3-0.7 units/ml Monitor platelets by anticoagulation protocol: Yes   Plan:  Therapeutic - will continue heparin @ 1500 units/hr and recheck HL/CBC daily Continue to monitor H&H and platelets  Lu Duffel, PharmD, BCPS Clinical Pharmacist 11/10/2020 3:20 PM

## 2020-11-10 NOTE — Plan of Care (Signed)

## 2020-11-10 NOTE — Progress Notes (Signed)
Pharmacy Antibiotic Note  [name] is a 60 y.o. male admitted on 11/09/2020 with wound infection.  Pharmacy has been consulted for Vanc, Cefepime dosing.  Plan: Cefepime 2 gm IV Q8H ordered to start on 5/10 @ 0100.  Vancomycin 2 gm IV X 1 given in ED on 5/10 @ 0042. Vancomycin 1250 mg IV Q12H ordered to continue on 5/10 @ 1300.   AUC = 476.6 Vanc trough = 13.2   Height: 5\' 11"  (180.3 cm) Weight: 88.5 kg (195 lb) IBW/kg (Calculated) : 75.3  Temp (24hrs), Avg:98.5 F (36.9 C), Min:98.5 F (36.9 C), Max:98.5 F (36.9 C)  Recent Labs  Lab 11/09/20 2135 11/09/20 2359  WBC 6.7  --   CREATININE 0.88  --   LATICACIDVEN  --  3.1*    Estimated Creatinine Clearance: 93.9 mL/min (by C-G formula based on SCr of 0.88 mg/dL).    Allergies  Allergen Reactions  . Erythromycin Base Anxiety  . Xanax [Alprazolam] Other (See Comments)    Tachycardia  . Benzodiazepines     PT STATES HE CANNOT TAKE THESE BECAUSE THEY ALMOST KILLED HIM  . Methocarbamol Anxiety and Other (See Comments)    Stomach cramping, weakness  . Sulfa Antibiotics Nausea And Vomiting and Other (See Comments)    Cramping in stomach and hyperventilate.     Antimicrobials this admission:   >>    >>   Dose adjustments this admission:   Microbiology results:  BCx:   UCx:    Sputum:    MRSA PCR:   Thank you for allowing pharmacy to be a part of this patient's care.  Venus Gilles D 11/10/2020 1:12 AM

## 2020-11-10 NOTE — Consult Note (Signed)
[facility] Vascular Consult Note  MRN : [id number]  [name] is a 60 y.o. (1960-10-29) male who presents with chief complaint of  Chief Complaint  Patient presents with  . Foot Pain   History of Present Illness: [name] is a 60 year old Caucasian male with medical history significant for type II diabetes mellitus, PAD, hypertension, peripheral neuropathy and DVT, who presented to the ER with acute onset of right foot erythema, swelling, pain and tenderness. She described his pain as burning. He denied any fever or chills. No chest pain or dyspnea or palpitations. No nausea or vomiting or abdominal pain. No cough or wheezing.  ED Course:  Upon presentation to the ER, vital signs were within normal. Labs revealed hyponatremia 131 hypochloremia of 95 and hyperglycemia 530. Lactic acid was 3.1. CBC showed hemoconcentration.    COVID 19: Negative  Imaging:  Right foot x-ray showed mild calcaneal spur with no findings to suggest osteomyelitis.  Vascular Surgery was consulted by Dr. Allena KatzPatel for possible endovascular intervention.  Current Facility-Administered Medications  Medication Dose Route Frequency Provider Last Rate Last Admin  . 0.9 %  sodium chloride infusion   Intravenous Continuous Mansy, Jan A, MD 100 mL/hr at 11/10/20 0209 New Bag at 11/10/20 0209  . acetaminophen (TYLENOL) tablet 650 mg  650 mg Oral Q6H PRN Mansy, Jan A, MD       Or  . acetaminophen (TYLENOL) suppository 650 mg  650 mg Rectal Q6H PRN Mansy, Jan A, MD      . aspirin EC tablet 81 mg  81 mg Oral Daily Mansy, Jan A, MD   81 mg at 11/10/20 0856  . busPIRone (BUSPAR) tablet 10 mg  10 mg Oral TID Mansy, Jan A, MD   10 mg at 11/10/20 0856  . ceFAZolin (ANCEF) IVPB 2g/100 mL premix  2 g Intravenous On Call to OR Schnier, Latina CraverGregory G, MD      . ceFEPIme (MAXIPIME) 2 g in sodium chloride 0.9 % 100 mL IVPB  2 g Intravenous Q8H Mansy, Jan A, MD 200 mL/hr at 11/10/20 0325 2 g at 11/10/20 0325   . gabapentin (NEURONTIN) capsule 100 mg  100 mg Oral TID Mansy, Jan A, MD   100 mg at 11/10/20 0856  . heparin ADULT infusion 100 units/mL (25000 units/28450mL)  1,500 Units/hr Intravenous Continuous Willy Eddyobinson, Patrick, MD 15 mL/hr at 11/10/20 0041 1,500 Units/hr at 11/10/20 0041  . HYDROmorphone (DILAUDID) injection 0.5 mg  0.5 mg Intravenous Q3H PRN Willy Eddyobinson, Patrick, MD   0.5 mg at 11/10/20 0924  . insulin aspart (novoLOG) injection 0-5 Units  0-5 Units Subcutaneous QHS Enedina FinnerPatel, Sona, MD      . insulin aspart (novoLOG) injection 0-9 Units  0-9 Units Subcutaneous TID WC Enedina FinnerPatel, Sona, MD      . insulin glargine (LANTUS) injection 24 Units  24 Units Subcutaneous QHS Mansy, Vernetta HoneyJan A, MD   24 Units at 11/10/20 0204  . magnesium hydroxide (MILK OF MAGNESIA) suspension 30 mL  30 mL Oral Daily PRN Mansy, Jan A, MD      . metoprolol tartrate (LOPRESSOR) tablet 50 mg  50 mg Oral BID Mansy, Jan A, MD   50 mg at 11/10/20 0856  . morphine 2 MG/ML injection 2 mg  2 mg Intravenous Q4H PRN Mansy, Jan A, MD      . ondansetron [facility](ZOFRAN) tablet 4 mg  4 mg Oral Q6H PRN Mansy, Vernetta HoneyJan A, MD  Or  . ondansetron (ZOFRAN) injection 4 mg  4 mg Intravenous Q6H PRN Mansy, Jan A, MD      . traZODone (DESYREL) tablet 25 mg  25 mg Oral QHS PRN Mansy, Jan A, MD      . vancomycin (VANCOREADY) IVPB 1250 mg/250 mL  1,250 mg Intravenous Q12H Mansy, Vernetta Honey, MD       Past Medical History:  Diagnosis Date  . Arthritis   . Blood clot in vein   . Diabetes mellitus   . DVT (deep venous thrombosis) (HCC)   . Hypertension   . Neuropathy   . Type 2 diabetes mellitus with diabetic neuropathy, unspecified (HCC) 02/18/2020   Past Surgical History:  Procedure Laterality Date  . APPENDECTOMY    . LOWER EXTREMITY ANGIOGRAPHY Right 01/27/2020   Procedure: LOWER EXTREMITY ANGIOGRAPHY;  Surgeon: Annice Needy, MD;  Location: [facility] INVASIVE CV LAB;  Service: Cardiovascular;  Laterality: Right;  . VEIN LIGATION AND STRIPPING     of left leg    Social History Social History   Tobacco Use  . Smoking status: Current Every Day Smoker    Packs/day: 2.00    Years: 6.00    Pack years: 12.00    Types: Cigarettes  . Smokeless tobacco: Never Used  . Tobacco comment: 1.5-2  Vaping Use  . Vaping Use: Never used  Substance Use Topics  . Alcohol use: Not Currently    Alcohol/week: 4.0 standard drinks    Types: 4 Cans of beer per week    Comment: 2 times a week  . Drug use: No   Family History Family History  Problem Relation Age of Onset  . Heart disease Mother   . Diabetes Father   . Heart disease Father   Denies family history peripheral arterial disease, venous disease or renal disease.  Allergies  Allergen Reactions  . Erythromycin Base Anxiety  . Xanax [Alprazolam] Other (See Comments)    Tachycardia  . Benzodiazepines     PT STATES HE CANNOT TAKE THESE BECAUSE THEY ALMOST KILLED HIM  . Methocarbamol Anxiety and Other (See Comments)    Stomach cramping, weakness  . Sulfa Antibiotics Nausea And Vomiting and Other (See Comments)    Cramping in stomach and hyperventilate.    REVIEW OF SYSTEMS (Negative unless checked)  Constitutional: [] Weight loss  [] Fever  [] Chills Cardiac: [] Chest pain   [] Chest pressure   [] Palpitations   [] Shortness of breath when laying flat   [] Shortness of breath at rest   [] Shortness of breath with exertion. Vascular:  [] Pain in legs with walking   [] Pain in legs at rest   [] Pain in legs when laying flat   [] Claudication   [x] Pain in feet when walking  [x] Pain in feet at rest  [x] Pain in feet when laying flat   [] History of DVT   [] Phlebitis   [] Swelling in legs   [] Varicose veins   [x] Non-healing ulcers Pulmonary:   [] Uses home oxygen   [] Productive cough   [] Hemoptysis   [] Wheeze  [] COPD   [] Asthma Neurologic:  [] Dizziness  [] Blackouts   [] Seizures   [] History of stroke   [] History of TIA  [] Aphasia   [] Temporary blindness   [] Dysphagia   [] Weakness or numbness in arms   [] Weakness or  numbness in legs Musculoskeletal:  [] Arthritis   [] Joint swelling   [] Joint pain   [] Low back pain Hematologic:  [] Easy bruising  [] Easy bleeding   [] Hypercoagulable state   [] Anemic  [] Hepatitis Gastrointestinal:  [] Blood  in stool   [] Vomiting blood  [] Gastroesophageal reflux/heartburn   [] Difficulty swallowing. Genitourinary:  [] Chronic kidney disease   [] Difficult urination  [] Frequent urination  [] Burning with urination   [] Blood in urine Skin:  [] Rashes   [x] Ulcers   [x] Wounds Psychological:  [] History of anxiety   []  History of major depression.  Physical Examination  Vitals:   11/10/20 0019 11/10/20 0112 11/10/20 0609 11/10/20 0725  BP: 118/78 137/82 121/76 139/74  Pulse: 77 76 68 70  Resp: 14 18 18 16   Temp:  97.6 F (36.4 C) 97.9 F (36.6 C) 98.3 F (36.8 C)  TempSrc:  Oral  Oral  SpO2: 94% 99% 94% 95%  Weight:      Height:       Body mass index is 27.2 kg/m. Gen:  WD/WN, NAD Head: [place]/AT, No temporalis wasting. Prominent temp pulse not noted. Ear/Nose/Throat: Hearing grossly intact, nares w/o erythema or drainage, oropharynx w/o Erythema/Exudate Eyes: Sclera non-icteric, conjunctiva clear Neck: Trachea midline.  No JVD.  Pulmonary:  Good air movement, respirations not labored, equal bilaterally.  Cardiac: RRR, normal S1, S2. Vascular:  Vessel Right Left  Radial Palpable Palpable  Ulnar Palpable Palpable  Brachial Palpable Palpable  Carotid Palpable, without bruit Palpable, without bruit  Aorta Not palpable N/A  Femoral Palpable Palpable  Popliteal Palpable Palpable  PT Non-Palpable Non-Palpable  DP Non-Palpable Non-Palpable   Right lower extremity: Thigh soft. Calf soft. Extremities warm distally. Wounds noted to the first right toe and the right heel.      Gastrointestinal: soft, non-tender/non-distended. No guarding/reflex.  Musculoskeletal: M/S 5/5 throughout.  Extremities without ischemic changes.  No deformity or atrophy. No edema. Neurologic:  Sensation grossly intact in extremities.  Symmetrical.  Speech is fluent. Motor exam as listed above. Psychiatric: Judgment intact, Mood & affect appropriate for pt's clinical situation. Dermatologic: As above  Lymph : No Cervical, Axillary, or Inguinal lymphadenopathy.  CBC Lab Results  Component Value Date   WBC 7.1 11/10/2020   HGB 16.6 11/10/2020   HCT 46.4 11/10/2020   MCV 92.1 11/10/2020   PLT 129 (L) 11/10/2020   BMET    Component Value Date/Time   NA 137 11/10/2020 0751   NA 138 09/18/2019 1221   NA 135 (L) 08/14/2013 1710   K 3.6 11/10/2020 0751   K 3.8 08/14/2013 1710   CL 106 11/10/2020 0751   CL 103 08/14/2013 1710   CO2 23 11/10/2020 0751   CO2 32 08/14/2013 1710   GLUCOSE 107 (H) 11/10/2020 0751   GLUCOSE 116 (H) 08/14/2013 1710   BUN 13 11/10/2020 0751   BUN 12 09/18/2019 1221   BUN 11 08/14/2013 1710   CREATININE 0.66 11/10/2020 0751   CREATININE 0.82 01/21/2016 1523   CALCIUM 8.8 (L) 11/10/2020 0751   CALCIUM 9.3 08/14/2013 1710   GFRNONAA >60 11/10/2020 0751   GFRNONAA >60 08/14/2013 1710   GFRNONAA >89 05/24/2013 1211   GFRAA >60 01/27/2020 1151   GFRAA >60 08/14/2013 1710   GFRAA >89 05/24/2013 1211   Estimated Creatinine Clearance: 103.3 mL/min (by C-G formula based on SCr of 0.66 mg/dL).  COAG Lab Results  Component Value Date   INR 0.9 11/09/2020   INR 1.0 09/29/2020   Radiology DG Foot Complete Right  Result Date: 11/09/2020 CLINICAL DATA:  Right foot ulcer and pain, initial encounter EXAM: RIGHT FOOT COMPLETE - 3+ VIEW COMPARISON:  09/15/2020, 08/15/2020 FINDINGS: Mild calcaneal spurring is again seen. No acute fracture or dislocation is noted. No bony erosive  changes are seen. Previously noted densities in the soft tissues near the head of the metatarsals are no longer identified and were likely extrinsic to the patient. IMPRESSION: No findings to suggest osteomyelitis are noted. Mild calcaneal spurring is noted. Electronically Signed    By: Alcide Clever M.D.   On: 11/09/2020 23:44   Assessment/Plan The patient is a 60 year old male with multiple medical issues well-known to our service for atherosclerotic disease bilateral lower extremity who presented to the [facility] emergency department complaining of progressively worsening right foot pain  1. Atherosclerotic disease to the lower extremity: Patient well-known to our service. Patient with known history of atherosclerotic disease to the lower extremity. Patient is notoriously noncompliant. In the setting of known arterial disease with a nonhealing ulceration recommend the patient undergo an angiogram and attempt to assess his anatomy contributing degree of atherosclerotic disease. If appropriate and attempt to revascularize likely made at that time. Procedure, risks and benefits were explained to the patient. All questions were answered. Patient wishes to proceed.  2. Tobacco dependence: We had a discussion for approximately three minutes regarding the absolute need for smoking cessation due to the deleterious nature of tobacco on the vascular system. We discussed the tobacco use would diminish patency of any intervention, and likely significantly worsen progressio of disease. We discussed multiple agents for quitting including replacement therapy or medications to reduce cravings such as Chantix. The patient voices their understanding of the importance of smoking cessation.  3. Diabetes: On appropriate medications better as noncompliant Encouraged good control as its slows the progression of atherosclerotic disease  4. Hyperlipidemia: On aspirin, Plavix and statin for medical management Noncompliant with taking medications Encouraged good control as its slows the progression of atherosclerotic disease  Discussed with Dr. Romie Jumper, PA-C  11/10/2020 10:39 AM  This note was created with Dragon medical transcription system.  Any  error is purely unintentional.

## 2020-11-13 NOTE — Discharge Summary (Signed)
  Pt left AMA. Final d/c diagnosis and med list not dictated. Pt was admitted with PAD, cellulitis and was suppose to get Vascular w/u. He decided to leave AMA despite discussion by vascular team the need for further w/u.

## 2020-11-16 ENCOUNTER — Other Ambulatory Visit: Payer: Self-pay

## 2020-11-16 MED FILL — Insulin Pen Needle 32 G X 4 MM (1/6" or 5/32"): 30 days supply | Qty: 100 | Fill #2 | Status: AC

## 2020-11-27 ENCOUNTER — Other Ambulatory Visit: Payer: Self-pay

## 2020-12-08 ENCOUNTER — Other Ambulatory Visit: Payer: Self-pay

## 2020-12-08 MED FILL — Gabapentin Cap 300 MG: ORAL | 14 days supply | Qty: 42 | Fill #1 | Status: AC

## 2020-12-24 ENCOUNTER — Other Ambulatory Visit: Payer: Self-pay

## 2020-12-24 ENCOUNTER — Ambulatory Visit: Payer: Self-pay | Admitting: Gerontology

## 2020-12-24 ENCOUNTER — Encounter: Payer: Self-pay | Admitting: Gerontology

## 2020-12-24 VITALS — BP 138/86 | HR 73 | Temp 97.4°F | Resp 16 | Wt 205.1 lb

## 2020-12-24 DIAGNOSIS — F341 Dysthymic disorder: Secondary | ICD-10-CM

## 2020-12-24 DIAGNOSIS — I10 Essential (primary) hypertension: Secondary | ICD-10-CM

## 2020-12-24 DIAGNOSIS — E785 Hyperlipidemia, unspecified: Secondary | ICD-10-CM

## 2020-12-24 DIAGNOSIS — Z09 Encounter for follow-up examination after completed treatment for conditions other than malignant neoplasm: Secondary | ICD-10-CM | POA: Insufficient documentation

## 2020-12-24 DIAGNOSIS — E1149 Type 2 diabetes mellitus with other diabetic neurological complication: Secondary | ICD-10-CM

## 2020-12-24 LAB — GLUCOSE, POCT (MANUAL RESULT ENTRY): POC Glucose: 257 mg/dl — AB (ref 70–99)

## 2020-12-24 MED ORDER — ATORVASTATIN CALCIUM 40 MG PO TABS
40.0000 mg | ORAL_TABLET | Freq: Every day | ORAL | 3 refills | Status: DC
  Filled 2020-12-24 – 2021-01-20 (×2): qty 30, 30d supply, fill #0

## 2020-12-24 MED ORDER — METFORMIN HCL 1000 MG PO TABS
ORAL_TABLET | Freq: Two times a day (BID) | ORAL | 2 refills | Status: DC
  Filled 2020-12-24: qty 60, fill #0
  Filled 2021-01-20: qty 60, 30d supply, fill #0

## 2020-12-24 MED ORDER — METOPROLOL TARTRATE 50 MG PO TABS
ORAL_TABLET | Freq: Two times a day (BID) | ORAL | 2 refills | Status: AC
  Filled 2020-12-24: qty 60, fill #0
  Filled 2021-01-20: qty 60, 30d supply, fill #0

## 2020-12-24 NOTE — Patient Instructions (Signed)
https://www.nhlbi.nih.gov/files/docs/public/heart/dash_brief.pdf">  DASH Eating Plan DASH stands for Dietary Approaches to Stop Hypertension. The DASH eating plan is a healthy eating plan that has been shown to: Reduce high blood pressure (hypertension). Reduce your risk for type 2 diabetes, heart disease, and stroke. Help with weight loss. What are tips for following this plan? Reading food labels Check food labels for the amount of salt (sodium) per serving. Choose foods with less than 5 percent of the Daily Value of sodium. Generally, foods with less than 300 milligrams (mg) of sodium per serving fit into this eating plan. To find whole grains, look for the word "whole" as the first word in the ingredient list. Shopping Buy products labeled as "low-sodium" or "no salt added." Buy fresh foods. Avoid canned foods and pre-made or frozen meals. Cooking Avoid adding salt when cooking. Use salt-free seasonings or herbs instead of table salt or sea salt. Check with your health care provider or pharmacist before using salt substitutes. Do not fry foods. Cook foods using healthy methods such as baking, boiling, grilling, roasting, and broiling instead. Cook with heart-healthy oils, such as olive, canola, avocado, soybean, or sunflower oil. Meal planning  Eat a balanced diet that includes: 4 or more servings of fruits and 4 or more servings of vegetables each day. Try to fill one-half of your plate with fruits and vegetables. 6-8 servings of whole grains each day. Less than 6 oz (170 g) of lean meat, poultry, or fish each day. A 3-oz (85-g) serving of meat is about the same size as a deck of cards. One egg equals 1 oz (28 g). 2-3 servings of low-fat dairy each day. One serving is 1 cup (237 mL). 1 serving of nuts, seeds, or beans 5 times each week. 2-3 servings of heart-healthy fats. Healthy fats called omega-3 fatty acids are found in foods such as walnuts, flaxseeds, fortified milks, and eggs.  These fats are also found in cold-water fish, such as sardines, salmon, and mackerel. Limit how much you eat of: Canned or prepackaged foods. Food that is high in trans fat, such as some fried foods. Food that is high in saturated fat, such as fatty meat. Desserts and other sweets, sugary drinks, and other foods with added sugar. Full-fat dairy products. Do not salt foods before eating. Do not eat more than 4 egg yolks a week. Try to eat at least 2 vegetarian meals a week. Eat more home-cooked food and less restaurant, buffet, and fast food.  Lifestyle When eating at a restaurant, ask that your food be prepared with less salt or no salt, if possible. If you drink alcohol: Limit how much you use to: 0-1 drink a day for women who are not pregnant. 0-2 drinks a day for men. Be aware of how much alcohol is in your drink. In the [place] one drink equals one 12 oz bottle of beer (355 mL), one 5 oz glass of wine (148 mL), or one 1 oz glass of hard liquor (44 mL). General information Avoid eating more than 2,300 mg of salt a day. If you have hypertension, you may need to reduce your sodium intake to 1,500 mg a day. Work with your health care provider to maintain a healthy body weight or to lose weight. Ask what an ideal weight is for you. Get at least 30 minutes of exercise that causes your heart to beat faster (aerobic exercise) most days of the week. Activities may include walking, swimming, or biking. Work with your health care provider   or dietitian to adjust your eating plan to your individual calorie needs. What foods should I eat? Fruits All fresh, dried, or frozen fruit. Canned fruit in natural juice (without addedsugar). Vegetables Fresh or frozen vegetables (raw, steamed, roasted, or grilled). Low-sodium or reduced-sodium tomato and vegetable juice. Low-sodium or reduced-sodium tomatosauce and tomato paste. Low-sodium or reduced-sodium canned vegetables. Grains Whole-grain or  whole-wheat bread. Whole-grain or whole-wheat pasta. Brown rice. Oatmeal. Quinoa. Bulgur. Whole-grain and low-sodium cereals. Pita bread.Low-fat, low-sodium crackers. Whole-wheat flour tortillas. Meats and other proteins Skinless chicken or turkey. Ground chicken or turkey. Pork with fat trimmed off. Fish and seafood. Egg whites. Dried beans, peas, or lentils. Unsalted nuts, nut butters, and seeds. Unsalted canned beans. Lean cuts of beef with fat trimmed off. Low-sodium, lean precooked or cured meat, such as sausages or meatloaves. Dairy Low-fat (1%) or fat-free (skim) milk. Reduced-fat, low-fat, or fat-free cheeses. Nonfat, low-sodium ricotta or cottage cheese. Low-fat or nonfatyogurt. Low-fat, low-sodium cheese. Fats and oils Soft margarine without trans fats. Vegetable oil. Reduced-fat, low-fat, or light mayonnaise and salad dressings (reduced-sodium). Canola, safflower, olive, avocado, soybean, andsunflower oils. Avocado. Seasonings and condiments Herbs. Spices. Seasoning mixes without salt. Other foods Unsalted popcorn and pretzels. Fat-free sweets. The items listed above may not be a complete list of foods and beverages you can eat. Contact a dietitian for more information. What foods should I avoid? Fruits Canned fruit in a light or heavy syrup. Fried fruit. Fruit in cream or buttersauce. Vegetables Creamed or fried vegetables. Vegetables in a cheese sauce. Regular canned vegetables (not low-sodium or reduced-sodium). Regular canned tomato sauce and paste (not low-sodium or reduced-sodium). Regular tomato and vegetable juice(not low-sodium or reduced-sodium). Pickles. Olives. Grains Baked goods made with fat, such as croissants, muffins, or some breads. Drypasta or rice meal packs. Meats and other proteins Fatty cuts of meat. Ribs. Fried meat. Bacon. Bologna, salami, and other precooked or cured meats, such as sausages or meat loaves. Fat from the back of a pig (fatback). Bratwurst.  Salted nuts and seeds. Canned beans with added salt. Canned orsmoked fish. Whole eggs or egg yolks. Chicken or turkey with skin. Dairy Whole or 2% milk, cream, and half-and-half. Whole or full-fat cream cheese. Whole-fat or sweetened yogurt. Full-fat cheese. Nondairy creamers. Whippedtoppings. Processed cheese and cheese spreads. Fats and oils Butter. Stick margarine. Lard. Shortening. Ghee. Bacon fat. Tropical oils, suchas coconut, palm kernel, or palm oil. Seasonings and condiments Onion salt, garlic salt, seasoned salt, table salt, and sea salt. Worcestershire sauce. Tartar sauce. Barbecue sauce. Teriyaki sauce. Soy sauce, including reduced-sodium. Steak sauce. Canned and packaged gravies. Fish sauce. Oyster sauce. Cocktail sauce. Store-bought horseradish. Ketchup. Mustard. Meat flavorings and tenderizers. Bouillon cubes. Hot sauces. Pre-made or packaged marinades. Pre-made or packaged taco seasonings. Relishes. Regular saladdressings. Other foods Salted popcorn and pretzels. The items listed above may not be a complete list of foods and beverages you should avoid. Contact a dietitian for more information. Where to find more information National Heart, Lung, and Blood Institute: www.nhlbi.nih.gov American Heart Association: www.heart.org Academy of Nutrition and Dietetics: www.eatright.org National Kidney Foundation: www.kidney.org Summary The DASH eating plan is a healthy eating plan that has been shown to reduce high blood pressure (hypertension). It may also reduce your risk for type 2 diabetes, heart disease, and stroke. When on the DASH eating plan, aim to eat more fresh fruits and vegetables, whole grains, lean proteins, low-fat dairy, and heart-healthy fats. With the DASH eating plan, you should limit salt (sodium) intake to 2,300   mg a day. If you have hypertension, you may need to reduce your sodium intake to 1,500 mg a day. Work with your health care provider or dietitian to adjust  your eating plan to your individual calorie needs. This information is not intended to replace advice given to you by your health care provider. Make sure you discuss any questions you have with your healthcare provider. Document Revised: 05/24/2019 Document Reviewed: 05/24/2019 Elsevier Patient Education  2022 Elsevier Inc. https://www.diabeteseducator.org/docs/default-source/living-with-diabetes/conquering-the-grocery-store-v1.pdf?sfvrsn=4">  Carbohydrate Counting for Diabetes Mellitus, Adult Carbohydrate counting is a method of keeping track of how many carbohydrates you eat. Eating carbohydrates naturally increases the amount of sugar (glucose) in the blood. Counting how many carbohydrates you eat improves your bloodglucose control, which helps you manage your diabetes. It is important to know how many carbohydrates you can safely have in each meal. This is different for every person. A dietitian can help you make a meal plan and calculate how many carbohydrates you should have at each meal andsnack. What foods contain carbohydrates? Carbohydrates are found in the following foods: Grains, such as breads and cereals. Dried beans and soy products. Starchy vegetables, such as potatoes, peas, and corn. Fruit and fruit juices. Milk and yogurt. Sweets and snack foods, such as cake, cookies, candy, chips, and soft drinks. How do I count carbohydrates in foods? There are two ways to count carbohydrates in food. You can read food labels or learn standard serving sizes of foods. You can use either of the methods or acombination of both. Using the Nutrition Facts label The Nutrition Facts list is included on the labels of almost all packaged foods and beverages in the [place] It includes: The serving size. Information about nutrients in each serving, including the grams (g) of carbohydrate per serving. To use the Nutrition Facts: Decide how many servings you will have. Multiply the number of servings  by the number of carbohydrates per serving. The resulting number is the total amount of carbohydrates that you will be having. Learning the standard serving sizes of foods When you eat carbohydrate foods that are not packaged or do not include Nutrition Facts on the label, you need to measure the servings in order to count the amount of carbohydrates. Measure the foods that you will eat with a food scale or measuring cup, if needed. Decide how many standard-size servings you will eat. Multiply the number of servings by 15. For foods that contain carbohydrates, one serving equals 15 g of carbohydrates. For example, if you eat 2 cups or 10 oz (300 g) of strawberries, you will have eaten 2 servings and 30 g of carbohydrates (2 servings x 15 g = 30 g). For foods that have more than one food mixed, such as soups and casseroles, you must count the carbohydrates in each food that is included. The following list contains standard serving sizes of common carbohydrate-rich foods. Each of these servings has about 15 g of carbohydrates: 1 slice of bread. 1 six-inch (15 cm) tortilla. ? cup or 2 oz (53 g) cooked rice or pasta.  cup or 3 oz (85 g) cooked or canned, drained and rinsed beans or lentils.  cup or 3 oz (85 g) starchy vegetable, such as peas, corn, or squash.  cup or 4 oz (120 g) hot cereal.  cup or 3 oz (85 g) boiled or mashed potatoes, or  or 3 oz (85 g) of a large baked potato.  cup or 4 fl oz (118 mL) fruit juice. 1 cup or   8 fl oz (237 mL) milk. 1 small or 4 oz (106 g) apple.  or 2 oz (63 g) of a medium banana. 1 cup or 5 oz (150 g) strawberries. 3 cups or 1 oz (24 g) popped popcorn. What is an example of carbohydrate counting? To calculate the number of carbohydrates in this sample meal, follow the stepsshown below. Sample meal 3 oz (85 g) chicken breast. ? cup or 4 oz (106 g) brown rice.  cup or 3 oz (85 g) corn. 1 cup or 8 fl oz (237 mL) milk. 1 cup or 5 oz (150 g)  strawberries with sugar-free whipped topping. Carbohydrate calculation Identify the foods that contain carbohydrates: Rice. Corn. Milk. Strawberries. Calculate how many servings you have of each food: 2 servings rice. 1 serving corn. 1 serving milk. 1 serving strawberries. Multiply each number of servings by 15 g: 2 servings rice x 15 g = 30 g. 1 serving corn x 15 g = 15 g. 1 serving milk x 15 g = 15 g. 1 serving strawberries x 15 g = 15 g. Add together all of the amounts to find the total grams of carbohydrates eaten: 30 g + 15 g + 15 g + 15 g = 75 g of carbohydrates total. What are tips for following this plan? Shopping Develop a meal plan and then make a shopping list. Buy fresh and frozen vegetables, fresh and frozen fruit, dairy, eggs, beans, lentils, and whole grains. Look at food labels. Choose foods that have more fiber and less sugar. Avoid processed foods and foods with added sugars. Meal planning Aim to have the same amount of carbohydrates at each meal and for each snack time. Plan to have regular, balanced meals and snacks. Where to find more information American Diabetes Association: www.diabetes.org Centers for Disease Control and Prevention: www.cdc.gov Summary Carbohydrate counting is a method of keeping track of how many carbohydrates you eat. Eating carbohydrates naturally increases the amount of sugar (glucose) in the blood. Counting how many carbohydrates you eat improves your blood glucose control, which helps you manage your diabetes. A dietitian can help you make a meal plan and calculate how many carbohydrates you should have at each meal and snack. This information is not intended to replace advice given to you by your health care provider. Make sure you discuss any questions you have with your healthcare provider. Document Revised: 06/20/2019 Document Reviewed: 06/21/2019 Elsevier Patient Education  2021 Elsevier Inc.  

## 2020-12-24 NOTE — Progress Notes (Signed)
Established Patient Office Visit  Subjective:  Patient ID: [name], male    DOB: 01-15-1961  Age: 60 y.o. MRN: [id number]  CC: No chief complaint on file.   HPI [name]  is a 60 y/o male who has history of  Arthritis, T2DM, DVT, Hypertension, Neuropathy, presents for routine visit and medication refill. He was discharged from [facility] on 12/17/20. He had RLE femoral-peroneal bypass with vein on 12/02/20. He received  2 days of broad spectrum antibiotics and antifungals for erythema and burning pain over distal incision site. He has stitches, staples to right lower leg and thighs. There is no erythema or drainage to site. Dressing to ulcer on the right great toe is intact. There is +1 edema to right foot. He continues on Augmentin after hospital discharge. He continues to smoke 6 cigarettes daily and continues to work on [employer]. His HgbA1c done on 11/26/20 at [facility] was 11.9%. He states that he's compliant with his medications, but has not checked his blood glucose since hospital discharged because he states that his meter is not working. His blood glucose reading was 257 mg/dl during visit. He denies hypoglycemia, but endorses polyuria, polydipsia and polyphagia. Overall, he states that the pain to his right lower leg is improving and he offers no further complaint.  Past Medical History:  Diagnosis Date   Arthritis    Blood clot in vein    Diabetes mellitus    DVT (deep venous thrombosis) ([place])    Hypertension    Neuropathy    Type 2 diabetes mellitus with diabetic neuropathy, unspecified ([place]) 02/18/2020    Past Surgical History:  Procedure Laterality Date   APPENDECTOMY     LOWER EXTREMITY ANGIOGRAPHY Right 01/27/2020   Procedure: LOWER EXTREMITY ANGIOGRAPHY;  Surgeon: Algernon Huxley, MD;  Location: [facility];  Service: Cardiovascular;  Laterality: Right;   VEIN LIGATION AND STRIPPING     of left leg    Family History  Problem Relation Age of Onset   Heart  disease Mother    Diabetes Father    Heart disease Father     Social History   Socioeconomic History   Marital status: Divorced    Spouse name: Not on file   Number of children: 0   Years of education: Not on file   Highest education level: Not on file  Occupational History   Not on file  Tobacco Use   Smoking status: Every Day    Packs/day: 2.00    Years: 6.00    Pack years: 12.00    Types: Cigarettes   Smokeless tobacco: Never   Tobacco comments:    1.5-2  Vaping Use   Vaping Use: Never used  Substance and Sexual Activity   Alcohol use: Not Currently    Alcohol/week: 4.0 standard drinks    Types: 4 Cans of beer per week    Comment: 2 times a week   Drug use: No   Sexual activity: Not on file  Other Topics Concern   Not on file  Social History Narrative   Social determinants screening completed 09/19/2019. HS      Patient was given resource for bus passes to link transit and non file tax for information for the stimulus check. [facility] referral made on 5/25 for future lab and follow up appointments at the clinic by scheduling ride coordination through [employer]. HS   Social Determinants of Health   Financial Resource Strain: High Risk  Difficulty of Paying Living Expenses: Very hard  Food Insecurity: Not on file  Transportation Needs: Unmet Transportation Needs   Lack of Transportation (Medical): Yes   Lack of Transportation (Non-Medical): Yes  Physical Activity: Not on file  Stress: Not on file  Social Connections: Not on file  Intimate Partner Violence: Not on file    Outpatient Medications Prior to Visit  Medication Sig Dispense Refill   acetaminophen (TYLENOL) 325 MG tablet Take 650 mg by mouth every 6 (six) hours as needed.      aspirin 81 MG EC tablet TAKE ONE TABLET BY MOUTH EVERY DAY 90 tablet 0   busPIRone (BUSPAR) 10 MG tablet TAKE ONE TABLET BY MOUTH 3 TIMES A DAY 90 tablet 0   clopidogrel (PLAVIX) 75 MG tablet Take 1 tablet (75 mg total) by  mouth daily. 30 tablet 11   famotidine (PEPCID) 20 MG tablet Take 1 tablet (20 mg total) by mouth 2 (two) times daily. 8 tablet 0   gabapentin (NEURONTIN) 300 MG capsule TAKE ONE CAPSULE BY MOUTH 3 TIMES A DAY (Patient taking differently: Take 300 mg by mouth 3 (three) times daily.) 90 capsule 2   atorvastatin (LIPITOR) 40 MG tablet Take 1 tablet (40 mg total) by mouth daily. 30 tablet 3   metFORMIN (GLUCOPHAGE) 1000 MG tablet TAKE ONE TABLET BY MOUTH 2 TIMES A DAY WITH A MEAL 60 tablet 2   metoprolol tartrate (LOPRESSOR) 50 MG tablet TAKE ONE TABLET BY MOUTH 2 TIMES A DAY 60 tablet 2   Blood Glucose Monitoring Suppl (RIGHTEST GM550 BLOOD GLUCOSE) w/Device KIT USE AS DIRECTED 1 kit 1   COMFORT EZ PEN NEEDLES 32G X 4 MM MISC USE WITH BASAGLAR AS DIRECTED 100 each 11   glucose blood (TRUE METRIX BLOOD GLUCOSE TEST) test strip Use as instructed 100 each 12   glucose blood test strip USE TO TEST BLOOD GLUCOSE UP TO 4 TIMES PER DAY, IN THE MORNING AND AFTER MEALS (Patient taking differently: USE TO TEST BLOOD GLUCOSE UP TO 4 TIMES PER DAY, IN THE MORNING AND AFTER MEALS) 100 strip 11   Insulin Glargine (BASAGLAR KWIKPEN) 100 UNIT/ML Inject 24 Units into the skin at bedtime. 15 mL 3   naproxen sodium (ALEVE) 220 MG tablet Take 220 mg by mouth daily as needed. (Patient not taking: No sig reported)     Rightest GL300 Lancets MISC USE AS DIRECTED 100 each 11   No facility-administered medications prior to visit.    Allergies  Allergen Reactions   Erythromycin Base Anxiety   Xanax [Alprazolam] Other (See Comments)    Tachycardia   Benzodiazepines     PT STATES HE CANNOT TAKE THESE BECAUSE THEY ALMOST KILLED HIM   Methocarbamol Anxiety and Other (See Comments)    Stomach cramping, weakness   Sulfa Antibiotics Nausea And Vomiting and Other (See Comments)    Cramping in stomach and hyperventilate.     ROS Review of Systems  Constitutional: Negative.   Eyes: Negative.   Respiratory: Negative.     Cardiovascular: Negative.   Endocrine: Positive for polydipsia, polyphagia and polyuria.  Skin:        Stitches, staples to right leg, and dressing to right great toe.  Neurological: Negative.   Psychiatric/Behavioral: Negative.       Objective:    Physical Exam HENT:     Head: Normocephalic and atraumatic.     Mouth/Throat:     Mouth: Mucous membranes are moist.  Eyes:  Extraocular Movements: Extraocular movements intact.     Conjunctiva/sclera: Conjunctivae normal.     Pupils: Pupils are equal, round, and reactive to light.  Cardiovascular:     Rate and Rhythm: Normal rate and regular rhythm.     Pulses: Normal pulses.     Heart sounds: Normal heart sounds.  Pulmonary:     Effort: Pulmonary effort is normal.     Breath sounds: Normal breath sounds.  Skin:    Comments: Dressing to right thigh, lower leg, and right great toe were intact.  Neurological:     General: No focal deficit present.     Mental Status: He is alert and oriented to person, place, and time. Mental status is at baseline.  Psychiatric:        Mood and Affect: Mood normal.        Behavior: Behavior normal.        Thought Content: Thought content normal.        Judgment: Judgment normal.    BP 138/86 (BP Location: Left Arm, Patient Position: Sitting, Cuff Size: Large)   Pulse 73   Temp (!) 97.4 F (36.3 C)   Resp 16   Wt 205 lb 1.6 oz (93 kg)   BMI 28.61 kg/m  Wt Readings from Last 3 Encounters:  12/24/20 205 lb 1.6 oz (93 kg)  11/09/20 195 lb (88.5 kg)  10/08/20 196 lb 11.2 oz (89.2 kg)     Health Maintenance Due  Topic Date Due   PNEUMOCOCCAL POLYSACCHARIDE VACCINE AGE 33-64 HIGH RISK  Never done   COVID-19 Vaccine (1) Never done   Pneumococcal Vaccine 60-11 Years old (1 - PCV) Never done   Hepatitis C Screening  Never done   Zoster Vaccines- Shingrix (1 of 2) Never done   COLONOSCOPY (Pts 45-50yr Insurance coverage will need to be confirmed)  Never done   FOOT EXAM  08/14/2020    OPHTHALMOLOGY EXAM  08/18/2020   URINE MICROALBUMIN  09/17/2020    There are no preventive care reminders to display for this patient.  Lab Results  Component Value Date   TSH 2.070 09/18/2019   Lab Results  Component Value Date   WBC 7.1 11/10/2020   HGB 16.6 11/10/2020   HCT 46.4 11/10/2020   MCV 92.1 11/10/2020   PLT 129 (L) 11/10/2020   Lab Results  Component Value Date   NA 137 11/10/2020   K 3.6 11/10/2020   CO2 23 11/10/2020   GLUCOSE 107 (H) 11/10/2020   BUN 13 11/10/2020   CREATININE 0.66 11/10/2020   BILITOT 0.8 09/29/2020   ALKPHOS 94 09/29/2020   AST 11 (L) 09/29/2020   ALT 15 09/29/2020   PROT 6.7 09/29/2020   ALBUMIN 4.0 09/29/2020   CALCIUM 8.8 (L) 11/10/2020   ANIONGAP 8 11/10/2020   Lab Results  Component Value Date   CHOL 151 04/22/2020   Lab Results  Component Value Date   HDL 32 (L) 04/22/2020   Lab Results  Component Value Date   LDLCALC 93 04/22/2020   Lab Results  Component Value Date   TRIG 147 04/22/2020   Lab Results  Component Value Date   CHOLHDL 4.7 04/22/2020   Lab Results  Component Value Date   HGBA1C 12.2 (H) 09/30/2020      Assessment & Plan:   1. Dyslipidemia -He will continue on current medication, low fat/cholesterol diet. - atorvastatin (LIPITOR) 40 MG tablet; Take 1 tablet (40 mg total) by mouth daily.  Dispense: 30 tablet;  Refill: 3  2. Persistent depressive disorder -He will continue on current medication, and call crisis help line with worsening symptoms. He will follow up with [facility] team Ms. Jerrilyn Cairo.  3. Type 2 diabetes mellitus with other neurologic complication, without long-term current use of insulin (HCC) -His HgbA1c was 11.9%, he will continue on current treatment regimen, his goal HgbA1c will be less than 7%. He was advised to bring glucometer to clinic for it to be exchanged. He will continue on low carb/non concentrated sweet diet. - metFORMIN (GLUCOPHAGE) 1000 MG tablet; TAKE  ONE TABLET BY MOUTH 2 TIMES A DAY WITH A MEAL  Dispense: 60 tablet; Refill: 2 - POCT Glucose (CBG); Future - HgB A1c; Future - Urine Microalbumin w/creat. ratio; Future - POCT Glucose (CBG)  4. Essential hypertension -His blood pressure is improving, he will continue on current medication, DASH diet and exercises as tolerated. - metoprolol tartrate (LOPRESSOR) 50 MG tablet; TAKE ONE TABLET BY MOUTH 2 TIMES A DAY  Dispense: 60 tablet; Refill: 2  5. Hospital discharge follow-up -He was advised to continue on his hospital discharge orders.     Follow-up: Return in about 27 days (around 01/20/2021), or if symptoms worsen or fail to improve.    Roselina Burgueno Jerold Coombe, NP

## 2021-01-20 ENCOUNTER — Encounter: Payer: Self-pay | Admitting: Gerontology

## 2021-01-20 ENCOUNTER — Other Ambulatory Visit: Payer: Self-pay

## 2021-01-20 ENCOUNTER — Ambulatory Visit: Payer: Medicaid Other | Admitting: Gerontology

## 2021-01-20 VITALS — BP 109/79 | HR 106 | Temp 96.6°F | Resp 16 | Ht 71.0 in | Wt 201.2 lb

## 2021-01-20 DIAGNOSIS — E1165 Type 2 diabetes mellitus with hyperglycemia: Secondary | ICD-10-CM

## 2021-01-20 DIAGNOSIS — F411 Generalized anxiety disorder: Secondary | ICD-10-CM

## 2021-01-20 DIAGNOSIS — I739 Peripheral vascular disease, unspecified: Secondary | ICD-10-CM

## 2021-01-20 DIAGNOSIS — L03115 Cellulitis of right lower limb: Secondary | ICD-10-CM

## 2021-01-20 LAB — GLUCOSE, POCT (MANUAL RESULT ENTRY): POC Glucose: 211 mg/dl — AB (ref 70–99)

## 2021-01-20 MED ORDER — ASPIRIN 81 MG PO TBEC
81.0000 mg | DELAYED_RELEASE_TABLET | Freq: Every day | ORAL | 1 refills | Status: DC
  Filled 2021-01-20: qty 30, 30d supply, fill #0
  Filled 2021-01-20: qty 90, 90d supply, fill #0

## 2021-01-20 MED ORDER — CEPHALEXIN 500 MG PO CAPS
500.0000 mg | ORAL_CAPSULE | Freq: Two times a day (BID) | ORAL | 0 refills | Status: DC
  Filled 2021-01-20 (×2): qty 14, 7d supply, fill #0

## 2021-01-20 MED ORDER — BUSPIRONE HCL 10 MG PO TABS
10.0000 mg | ORAL_TABLET | Freq: Three times a day (TID) | ORAL | 3 refills | Status: AC
  Filled 2021-01-20 (×2): qty 90, 30d supply, fill #0

## 2021-01-20 MED ORDER — GABAPENTIN 300 MG PO CAPS
ORAL_CAPSULE | Freq: Three times a day (TID) | ORAL | 2 refills | Status: AC
  Filled 2021-01-20: qty 90, 30d supply, fill #0
  Filled 2021-01-20: qty 90, fill #0

## 2021-01-20 MED FILL — Insulin Pen Needle 32 G X 4 MM (1/6" or 5/32"): 100 days supply | Qty: 100 | Fill #3 | Status: AC

## 2021-01-20 MED FILL — Lancets: 25 days supply | Qty: 100 | Fill #1 | Status: AC

## 2021-01-20 MED FILL — Glucose Blood Test Strip: 25 days supply | Qty: 100 | Fill #1 | Status: AC

## 2021-01-20 NOTE — Progress Notes (Signed)
Established Patient Office Visit  Subjective:  Patient ID: [name], male    DOB: Dec 06, 1960  Age: 60 y.o. MRN: [id number]  CC:  Chief Complaint  Patient presents with   Follow-up   Diabetes    Patient has not been checking his blood sugars.   Hypertension    Patient has not been checking his blood pressure at home.    HPI [name] is a 60 year old male who has a history of DVT, type 2 diabetes, hypertension presents for routine follow-up and lab review. His last HgbA1c done on 11/26/20 was 11.9%. He states that he doesn't check his blood glucose as he should. He reports checking his blood glucose 2 days ago. He denies Hypoglycemic symptoms, admits to hyperglycemic symptoms. He states that his peripheral neuropathy is under control with taking gabapentin. He self discontinued his Plavix, states that he feels funny after taking it. He has a dehisced area to the medial aspect  of his right lower leg above his ankle that's draining purulent exudate, and surrounding intact skin is erythematous, swollen and tender with palpation. The entire of the stapled area on his right leg are intact, no erythema nor drainage, and he will follow up with Vascular Surgery on 02/06/21 at [facility]. He denies chest pain, palpitation, fever and chills. Overall, he states that he's doing well and offers no further complaint.  Past Medical History:  Diagnosis Date   Arthritis    Blood clot in vein    Diabetes mellitus    DVT (deep venous thrombosis) ([place])    Hypertension    Neuropathy    Type 2 diabetes mellitus with diabetic neuropathy, unspecified ([place]) 02/18/2020    Past Surgical History:  Procedure Laterality Date   APPENDECTOMY     LOWER EXTREMITY ANGIOGRAPHY Right 01/27/2020   Procedure: LOWER EXTREMITY ANGIOGRAPHY;  Surgeon: Algernon Huxley, MD;  Location: [facility];  Service: Cardiovascular;  Laterality: Right;   VEIN LIGATION AND STRIPPING     of left leg    Family History  Problem  Relation Age of Onset   Heart disease Mother    Diabetes Father    Heart disease Father    Diabetes Brother     Social History   Socioeconomic History   Marital status: Divorced    Spouse name: Not on file   Number of children: 0   Years of education: Not on file   Highest education level: Not on file  Occupational History   Not on file  Tobacco Use   Smoking status: Every Day    Packs/day: 1.00    Years: 6.00    Pack years: 6.00    Types: Cigarettes   Smokeless tobacco: Never   Tobacco comments:    1.5-2  Vaping Use   Vaping Use: Never used  Substance and Sexual Activity   Alcohol use: Yes    Alcohol/week: 4.0 standard drinks    Types: 4 Cans of beer per week    Comment: 2 times a month (1 24oz beer)   Drug use: No   Sexual activity: Not on file  Other Topics Concern   Not on file  Social History Narrative   Social determinants screening completed 09/19/2019. HS      Patient was given resource for bus passes to link transit and non file tax for information for the stimulus check. [place] [facility] referral made on 5/25 for future lab and follow up appointments at the clinic by scheduling  ride coordination through [employer]. HS   Social Determinants of Health   Financial Resource Strain: High Risk   Difficulty of Paying Living Expenses: Very hard  Food Insecurity: No Food Insecurity   Worried About Charity fundraiser in the Last Year: Never true   Arboriculturist in the Last Year: Never true  Transportation Needs: Unmet Transportation Needs   Lack of Transportation (Medical): Yes   Lack of Transportation (Non-Medical): Yes  Physical Activity: Not on file  Stress: Not on file  Social Connections: Not on file  Intimate Partner Violence: Not on file    Outpatient Medications Prior to Visit  Medication Sig Dispense Refill   acetaminophen (TYLENOL) 325 MG tablet Take 650 mg by mouth every 6 (six) hours as needed.      atorvastatin (LIPITOR) 40 MG tablet Take 1 tablet  (40 mg total) by mouth daily. 30 tablet 3   COMFORT EZ PEN NEEDLES 32G X 4 MM MISC USE WITH BASAGLAR AS DIRECTED 100 each 11   Insulin Glargine (BASAGLAR KWIKPEN) 100 UNIT/ML Inject 24 Units into the skin at bedtime. 15 mL 3   metFORMIN (GLUCOPHAGE) 1000 MG tablet TAKE ONE TABLET BY MOUTH 2 TIMES A DAY WITH A MEAL 60 tablet 2   metoprolol tartrate (LOPRESSOR) 50 MG tablet TAKE ONE TABLET BY MOUTH 2 TIMES A DAY 60 tablet 2   aspirin 81 MG EC tablet TAKE ONE TABLET BY MOUTH EVERY DAY 90 tablet 0   busPIRone (BUSPAR) 10 MG tablet Take 1 tablet by mouth 3 (three) times daily.     gabapentin (NEURONTIN) 300 MG capsule TAKE ONE CAPSULE BY MOUTH 3 TIMES A DAY (Patient taking differently: Take 300 mg by mouth 3 (three) times daily.) 90 capsule 2   Blood Glucose Monitoring Suppl (RIGHTEST GM550 BLOOD GLUCOSE) w/Device KIT USE AS DIRECTED 1 kit 1   clopidogrel (PLAVIX) 75 MG tablet Take 1 tablet (75 mg total) by mouth daily. (Patient not taking: Reported on 01/20/2021) 30 tablet 11   glucose blood (TRUE METRIX BLOOD GLUCOSE TEST) test strip Use as instructed 100 each 12   glucose blood test strip USE TO TEST BLOOD GLUCOSE UP TO 4 TIMES PER DAY, IN THE MORNING AND AFTER MEALS (Patient taking differently: USE TO TEST BLOOD GLUCOSE UP TO 4 TIMES PER DAY, IN THE MORNING AND AFTER MEALS) 100 strip 11   Rightest GL300 Lancets MISC USE AS DIRECTED 100 each 11   famotidine (PEPCID) 20 MG tablet Take 1 tablet (20 mg total) by mouth 2 (two) times daily. (Patient not taking: Reported on 01/20/2021) 8 tablet 0   naproxen sodium (ALEVE) 220 MG tablet Take 220 mg by mouth daily as needed. (Patient not taking: No sig reported)     No facility-administered medications prior to visit.    Allergies  Allergen Reactions   Erythromycin Base Anxiety   Xanax [Alprazolam] Other (See Comments)    Tachycardia   Benzodiazepines     PT STATES HE CANNOT TAKE THESE BECAUSE THEY ALMOST KILLED HIM   Methocarbamol Anxiety and Other  (See Comments)    Stomach cramping, weakness   Sulfa Antibiotics Nausea And Vomiting and Other (See Comments)    Cramping in stomach and hyperventilate.     ROS Review of Systems  Constitutional: Negative.   Eyes: Negative.   Respiratory: Negative.    Cardiovascular: Negative.   Endocrine: Positive for polyphagia and polyuria.  Skin:        Dehisced area  on medial aspect of right lower leg above the ankle, draining purulent exudate, surrounding intact skin is erythematous and tender with palpation   Neurological:  Positive for numbness.  Psychiatric/Behavioral: Negative.       Objective:    Physical Exam HENT:     Head: Normocephalic and atraumatic.     Mouth/Throat:     Mouth: Mucous membranes are moist.  Eyes:     Extraocular Movements: Extraocular movements intact.     Conjunctiva/sclera: Conjunctivae normal.     Pupils: Pupils are equal, round, and reactive to light.  Cardiovascular:     Rate and Rhythm: Normal rate and regular rhythm.     Pulses: Normal pulses.     Heart sounds: Normal heart sounds.  Pulmonary:     Effort: Pulmonary effort is normal.     Breath sounds: Normal breath sounds.  Skin:      Neurological:     Mental Status: He is alert.    BP 109/79 (BP Location: Left Arm, Patient Position: Sitting, Cuff Size: Large)   Pulse (!) 106   Temp (!) 96.6 F (35.9 C)   Resp 16   Ht '5\' 11"'  (1.803 m)   Wt 201 lb 3.2 oz (91.3 kg)   SpO2 92%   BMI 28.06 kg/m  Wt Readings from Last 3 Encounters:  01/20/21 201 lb 3.2 oz (91.3 kg)  12/24/20 205 lb 1.6 oz (93 kg)  11/09/20 195 lb (88.5 kg)     Health Maintenance Due  Topic Date Due   PNEUMOCOCCAL POLYSACCHARIDE VACCINE AGE 33-64 HIGH RISK  Never done   COVID-19 Vaccine (1) Never done   Pneumococcal Vaccine 52-95 Years old (1 - PCV) Never done   Hepatitis C Screening  Never done   Zoster Vaccines- Shingrix (1 of 2) Never done   COLONOSCOPY (Pts 45-51yr Insurance coverage will need to be confirmed)   Never done   FOOT EXAM  08/14/2020   OPHTHALMOLOGY EXAM  08/18/2020   URINE MICROALBUMIN  09/17/2020    There are no preventive care reminders to display for this patient.  Lab Results  Component Value Date   TSH 2.070 09/18/2019   Lab Results  Component Value Date   WBC 7.1 11/10/2020   HGB 16.6 11/10/2020   HCT 46.4 11/10/2020   MCV 92.1 11/10/2020   PLT 129 (L) 11/10/2020   Lab Results  Component Value Date   NA 137 11/10/2020   K 3.6 11/10/2020   CO2 23 11/10/2020   GLUCOSE 107 (H) 11/10/2020   BUN 13 11/10/2020   CREATININE 0.66 11/10/2020   BILITOT 0.8 09/29/2020   ALKPHOS 94 09/29/2020   AST 11 (L) 09/29/2020   ALT 15 09/29/2020   PROT 6.7 09/29/2020   ALBUMIN 4.0 09/29/2020   CALCIUM 8.8 (L) 11/10/2020   ANIONGAP 8 11/10/2020   Lab Results  Component Value Date   CHOL 151 04/22/2020   Lab Results  Component Value Date   HDL 32 (L) 04/22/2020   Lab Results  Component Value Date   LDLCALC 93 04/22/2020   Lab Results  Component Value Date   TRIG 147 04/22/2020   Lab Results  Component Value Date   CHOLHDL 4.7 04/22/2020   Lab Results  Component Value Date   HGBA1C 12.2 (H) 09/30/2020      Assessment & Plan:     1. Peripheral vascular disease ([place] -He will continue on his Aspirin daily. - aspirin 81 MG EC tablet; Take 1 tablet (81  mg total) by mouth daily. Swallow whole.  Dispense: 90 tablet; Refill: 1  2. Uncontrolled type 2 diabetes mellitus with hyperglycemia, with long-term current use of insulin ([place]) -His Diabetes is uncontrolled due to non compliance. He will continue on his current medication, advised to check blood glucose at least bid, his fasting reading should be between 80-130 mg/dl.  His HgbA1c was 11.9%, his goal should be less than 7%. He was advised to continue on low carb/non concentrated sweet diet. - gabapentin (NEURONTIN) 300 MG capsule; TAKE ONE CAPSULE BY MOUTH 3 TIMES A DAY  Dispense: 90 capsule; Refill: 2 - POCT  Glucose (CBG); Future - Urine Microalbumin w/creat. ratio; Future - HgB A1c; Future - POCT Glucose (CBG)  3. Generalized anxiety disorder - He will continue on current medication, and follow up with Ms. Jerrilyn Cairo. He was advised to call the Crisis Helpline with worsening symptoms. - busPIRone (BUSPAR) 10 MG tablet; Take 1 tablet (10 mg total) by mouth 3 (three) times daily.  Dispense: 90 tablet; Refill: 3  4. Cellulitis of right lower extremity - He will continue on Keflex, was educated on medication side effects and advised to notify clinic. - cephALEXin (KEFLEX) 500 MG capsule; Take 1 capsule (500 mg total) by mouth 2 (two) times dailyfor 7 days.  Dispense: 14 capsule; Refill: 0   Follow-up: Return in about 6 weeks (around 03/03/2021), or if symptoms worsen or fail to improve.    Charlye Spare Jerold Coombe, NP

## 2021-01-20 NOTE — Patient Instructions (Signed)
https://www.diabeteseducator.org/docs/default-source/living-with-diabetes/conquering-the-grocery-store-v1.pdf?sfvrsn=4">  Carbohydrate Counting for Diabetes Mellitus, Adult Carbohydrate counting is a method of keeping track of how many carbohydrates you eat. Eating carbohydrates naturally increases the amount of sugar (glucose) in the blood. Counting how many carbohydrates you eat improves your bloodglucose control, which helps you manage your diabetes. It is important to know how many carbohydrates you can safely have in each meal. This is different for every person. A dietitian can help you make a meal plan and calculate how many carbohydrates you should have at each meal andsnack. What foods contain carbohydrates? Carbohydrates are found in the following foods: Grains, such as breads and cereals. Dried beans and soy products. Starchy vegetables, such as potatoes, peas, and corn. Fruit and fruit juices. Milk and yogurt. Sweets and snack foods, such as cake, cookies, candy, chips, and soft drinks. How do I count carbohydrates in foods? There are two ways to count carbohydrates in food. You can read food labels or learn standard serving sizes of foods. You can use either of the methods or acombination of both. Using the Nutrition Facts label The Nutrition Facts list is included on the labels of almost all packaged foods and beverages in the [place] It includes: The serving size. Information about nutrients in each serving, including the grams (g) of carbohydrate per serving. To use the Nutrition Facts: Decide how many servings you will have. Multiply the number of servings by the number of carbohydrates per serving. The resulting number is the total amount of carbohydrates that you will be having. Learning the standard serving sizes of foods When you eat carbohydrate foods that are not packaged or do not include Nutrition Facts on the label, you need to measure the servings in order to count the  amount of carbohydrates. Measure the foods that you will eat with a food scale or measuring cup, if needed. Decide how many standard-size servings you will eat. Multiply the number of servings by 15. For foods that contain carbohydrates, one serving equals 15 g of carbohydrates. For example, if you eat 2 cups or 10 oz (300 g) of strawberries, you will have eaten 2 servings and 30 g of carbohydrates (2 servings x 15 g = 30 g). For foods that have more than one food mixed, such as soups and casseroles, you must count the carbohydrates in each food that is included. The following list contains standard serving sizes of common carbohydrate-rich foods. Each of these servings has about 15 g of carbohydrates: 1 slice of bread. 1 six-inch (15 cm) tortilla. ? cup or 2 oz (53 g) cooked rice or pasta.  cup or 3 oz (85 g) cooked or canned, drained and rinsed beans or lentils.  cup or 3 oz (85 g) starchy vegetable, such as peas, corn, or squash.  cup or 4 oz (120 g) hot cereal.  cup or 3 oz (85 g) boiled or mashed potatoes, or  or 3 oz (85 g) of a large baked potato.  cup or 4 fl oz (118 mL) fruit juice. 1 cup or 8 fl oz (237 mL) milk. 1 small or 4 oz (106 g) apple.  or 2 oz (63 g) of a medium banana. 1 cup or 5 oz (150 g) strawberries. 3 cups or 1 oz (24 g) popped popcorn. What is an example of carbohydrate counting? To calculate the number of carbohydrates in this sample meal, follow the stepsshown below. Sample meal 3 oz (85 g) chicken breast. ? cup or 4 oz (106 g) brown rice.    cup or 3 oz (85 g) corn. 1 cup or 8 fl oz (237 mL) milk. 1 cup or 5 oz (150 g) strawberries with sugar-free whipped topping. Carbohydrate calculation Identify the foods that contain carbohydrates: Rice. Corn. Milk. Strawberries. Calculate how many servings you have of each food: 2 servings rice. 1 serving corn. 1 serving milk. 1 serving strawberries. Multiply each number of servings by 15 g: 2 servings  rice x 15 g = 30 g. 1 serving corn x 15 g = 15 g. 1 serving milk x 15 g = 15 g. 1 serving strawberries x 15 g = 15 g. Add together all of the amounts to find the total grams of carbohydrates eaten: 30 g + 15 g + 15 g + 15 g = 75 g of carbohydrates total. What are tips for following this plan? Shopping Develop a meal plan and then make a shopping list. Buy fresh and frozen vegetables, fresh and frozen fruit, dairy, eggs, beans, lentils, and whole grains. Look at food labels. Choose foods that have more fiber and less sugar. Avoid processed foods and foods with added sugars. Meal planning Aim to have the same amount of carbohydrates at each meal and for each snack time. Plan to have regular, balanced meals and snacks. Where to find more information American Diabetes Association: www.diabetes.org Centers for Disease Control and Prevention: www.cdc.gov Summary Carbohydrate counting is a method of keeping track of how many carbohydrates you eat. Eating carbohydrates naturally increases the amount of sugar (glucose) in the blood. Counting how many carbohydrates you eat improves your blood glucose control, which helps you manage your diabetes. A dietitian can help you make a meal plan and calculate how many carbohydrates you should have at each meal and snack. This information is not intended to replace advice given to you by your health care provider. Make sure you discuss any questions you have with your healthcare provider. Document Revised: 06/20/2019 Document Reviewed: 06/21/2019 Elsevier Patient Education  2021 Elsevier Inc.  

## 2021-01-21 ENCOUNTER — Telehealth: Payer: Self-pay | Admitting: Pharmacy Technician

## 2021-01-21 NOTE — Telephone Encounter (Signed)
Received updated proof of income.  Patient eligible to receive medication assistance at [facility] until time for re-certification in 2023, and as long as eligibility requirements continue to be met.  [name] Care Manager [facility]  

## 2021-01-27 ENCOUNTER — Telehealth: Payer: Self-pay | Admitting: Pharmacist

## 2021-01-27 ENCOUNTER — Telehealth: Payer: Self-pay

## 2021-01-27 NOTE — Telephone Encounter (Signed)
Patient approved for medication assistance at [facility] until 09/01/21, as long as eligibility criteria continues to be met. [place] Assistant [facility]

## 2021-01-27 NOTE — Telephone Encounter (Signed)
Social worker called patient and scheduled his last appointment with the [facility].  Patient just got Medicaid and will be going to a provider via [facility].

## 2021-01-27 NOTE — Telephone Encounter (Signed)
Social worker called patient back and informed him that because he has Medicaid his appointment with the [facility] would be cancelled and was informed by patient that he will be going to a mental health provider on [facility] next week for his intake.

## 2021-01-28 ENCOUNTER — Other Ambulatory Visit: Payer: Self-pay

## 2021-02-01 ENCOUNTER — Other Ambulatory Visit: Payer: Self-pay

## 2021-02-02 ENCOUNTER — Ambulatory Visit: Payer: Medicaid Other | Admitting: Licensed Clinical Social Worker

## 2021-02-03 ENCOUNTER — Other Ambulatory Visit: Payer: Self-pay

## 2021-02-24 ENCOUNTER — Other Ambulatory Visit: Payer: Self-pay

## 2021-02-24 ENCOUNTER — Telehealth (INDEPENDENT_AMBULATORY_CARE_PROVIDER_SITE_OTHER): Payer: Self-pay | Admitting: Vascular Surgery

## 2021-02-24 NOTE — Telephone Encounter (Signed)
Patient had bypass surgery in right legs, patient had the procedure done at [facility].  Patient instructions were to get staples removed back in August but due to a family emergency he couldn't make it.  Patient called the clinic back to reschedule appointment at [facility] but will not be able to get an appointment until 9/27.  Patient comes to [facility] hoping to get an appointment to get staples removed.  He states they're starting to protrude out of his skin and is uncomfortable.  Per [facility] recommendations, patient need to go back to [facility] to have staples removed.  For documentation purposes only.

## 2021-03-03 ENCOUNTER — Ambulatory Visit: Payer: Self-pay | Admitting: Gerontology

## 2021-05-13 ENCOUNTER — Telehealth: Payer: Self-pay | Admitting: Pharmacy Technician

## 2021-05-13 NOTE — Telephone Encounter (Signed)
Patient has Medicaid with prescription drug coverage.  No longer meets [facility]'s eligibility criteria.  Sherilyn Dacosta Care Manager [facility]   [address] [place], [place]  [address]  May 13, 2021    Nikolas Casher [address] [place], [place]  [address]  Dear Remi Deter:  This is to inform you that you are no longer eligible to receive medication assistance at [facility].  The reason(s) are:    _____Your total gross monthly household income exceeds 250% of the Federal Poverty Level.   _____Tangible assets (savings, checking, stocks/bonds, pension, retirement, etc.) exceeds our limit  __X__You are eligible to receive benefits from [facility] or HIV Medication            Assistance program _____You are eligible to receive benefits from a Medicare Part "D" plan __X__You have prescription insurance with Medicaid _____You are not an [facility] resident _____Failure to provide all requested documentation (proof of income information for 2022., and/or Patient Intake Application, DOH Attestation, Contract, etc).    We regret that we are unable to help you at this time.  If your prescription coverage is terminated, please contact [facility], so that we may reassess your eligibility for our program.  If you have questions, we may be contacted at [phone].  Thank you,  [facility]

## 2021-05-19 ENCOUNTER — Other Ambulatory Visit: Payer: Self-pay

## 2021-05-26 ENCOUNTER — Emergency Department: Payer: Medicaid Other

## 2021-05-26 ENCOUNTER — Emergency Department
Admission: EM | Admit: 2021-05-26 | Discharge: 2021-05-26 | Disposition: A | Discharge status: Home / Self Care | Payer: Medicaid Other | Attending: Emergency Medicine | Admitting: Emergency Medicine

## 2021-05-26 ENCOUNTER — Other Ambulatory Visit: Payer: Self-pay

## 2021-05-26 ENCOUNTER — Encounter: Payer: Self-pay | Admitting: Emergency Medicine

## 2021-05-26 DIAGNOSIS — Z794 Long term (current) use of insulin: Secondary | ICD-10-CM | POA: Insufficient documentation

## 2021-05-26 DIAGNOSIS — M79674 Pain in right toe(s): Secondary | ICD-10-CM | POA: Diagnosis present

## 2021-05-26 DIAGNOSIS — Z7982 Long term (current) use of aspirin: Secondary | ICD-10-CM | POA: Insufficient documentation

## 2021-05-26 DIAGNOSIS — Z95828 Presence of other vascular implants and grafts: Secondary | ICD-10-CM | POA: Insufficient documentation

## 2021-05-26 DIAGNOSIS — I10 Essential (primary) hypertension: Secondary | ICD-10-CM | POA: Insufficient documentation

## 2021-05-26 DIAGNOSIS — Z7902 Long term (current) use of antithrombotics/antiplatelets: Secondary | ICD-10-CM | POA: Insufficient documentation

## 2021-05-26 DIAGNOSIS — F17210 Nicotine dependence, cigarettes, uncomplicated: Secondary | ICD-10-CM | POA: Insufficient documentation

## 2021-05-26 DIAGNOSIS — Z79899 Other long term (current) drug therapy: Secondary | ICD-10-CM | POA: Insufficient documentation

## 2021-05-26 DIAGNOSIS — L03031 Cellulitis of right toe: Secondary | ICD-10-CM | POA: Insufficient documentation

## 2021-05-26 DIAGNOSIS — E1140 Type 2 diabetes mellitus with diabetic neuropathy, unspecified: Secondary | ICD-10-CM | POA: Diagnosis not present

## 2021-05-26 LAB — BASIC METABOLIC PANEL
Anion gap: 6 (ref 5–15)
BUN: 14 mg/dL (ref 8–23)
CO2: 27 mmol/L (ref 22–32)
Calcium: 9 mg/dL (ref 8.9–10.3)
Chloride: 96 mmol/L — ABNORMAL LOW (ref 98–111)
Creatinine, Ser: 0.74 mg/dL (ref 0.61–1.24)
GFR, Estimated: >60 mL/min (ref >60)
Glucose, Bld: 197 mg/dL — ABNORMAL HIGH (ref 70–99)
Potassium: 4.7 mmol/L (ref 3.5–5.1)
Sodium: 129 mmol/L — ABNORMAL LOW (ref 135–145)

## 2021-05-26 LAB — TROPONIN I (HIGH SENSITIVITY): Troponin I (High Sensitivity): 3 ng/L (ref <18)

## 2021-05-26 LAB — CBC
HCT: 48.5 % (ref 39.0–52.0)
Hemoglobin: 17 g/dL (ref 13.0–17.0)
MCH: 32.1 pg (ref 26.0–34.0)
MCHC: 35.1 g/dL (ref 30.0–36.0)
MCV: 91.7 fL (ref 80.0–100.0)
Platelets: 135 10*3/uL — ABNORMAL LOW (ref 150–400)
RBC: 5.29 MIL/uL (ref 4.22–5.81)
RDW: 13.2 % (ref 11.5–15.5)
WBC: 10.1 10*3/uL (ref 4.0–10.5)
nRBC: 0 % (ref 0.0–0.2)

## 2021-05-26 MED ORDER — CLINDAMYCIN HCL 150 MG PO CAPS: 450.0000 mg | ORAL_CAPSULE | Freq: Three times a day (TID) | ORAL | 0 refills | Status: AC

## 2021-05-26 MED ORDER — CLINDAMYCIN HCL 150 MG PO CAPS
450.0000 mg | ORAL_CAPSULE | Freq: Once | ORAL | Status: AC
  Administered 2021-05-26: 450 mg via ORAL
  Filled 2021-05-26: qty 3

## 2021-05-26 MED ORDER — CLINDAMYCIN HCL 150 MG PO CAPS
450.0000 mg | ORAL_CAPSULE | Freq: Three times a day (TID) | ORAL | 0 refills | Status: DC
  Filled 2021-05-26: qty 45, 5d supply, fill #0

## 2021-05-26 NOTE — ED Provider Notes (Signed)
[facility] Emergency Department Provider Note  ____________________________________________   Event Date/Time   First MD Initiated Contact with Patient 05/26/21 1604     (approximate)  I have reviewed the triage vital signs and the nursing notes.   HISTORY  Chief Complaint Abnormal Lab    HPI [name] is a 60 y.o. male with history of DVT, diabetes who comes in with concerns for elevated potassium over 5.  Denies any symptoms.  He did have a bypass graft completed on the right leg in May through [facility].  He reports having some worsening pain on his right toe, constant, severe, nothing makes it better or worse.  He reports intermittent flareups of his toe turning red.  He denies ever being told he has gout.  Denies any fevers or other concerns.  He denies any swelling in his leg or calf tenderness.  I am unable to see PCP notes but patient reports his K was reportedly 5.4 at 5.8.          Past Medical History:  Diagnosis Date   Arthritis    Blood clot in vein    Diabetes mellitus    DVT (deep venous thrombosis) ([place])    Hypertension    Neuropathy    Type 2 diabetes mellitus with diabetic neuropathy, unspecified ([place]) 02/18/2020    Patient Active Problem List   Diagnosis Date Noted   Hospital discharge follow-up 12/24/2020   DM (diabetes mellitus), type 2 with peripheral vascular complications ([place])    Tobacco abuse counseling    Cellulitis of right foot 11/09/2020   Ulcer of heel due to diabetes ([place]) 10/08/2020   Pain of right great toe 10/08/2020   Ischemic foot 09/29/2020   Excessive cerumen in ear canal, bilateral 05/05/2020   History of anxiety 03/11/2020   Ischemia of lower extremity 01/16/2020   Needs assistance with community resources 09/11/2019   Claudication of right lower extremity ([place]) 10/23/2018   Neuropathic pain of ankle, left 01/25/2017   Adjustment disorder with mixed anxiety and depressed mood 08/27/2016   MDD  (major depressive disorder) 08/10/2016   Hypertriglyceridemia 06/30/2016   Thrombocytopenia, unspecified ([place]) 12/06/2012   Diabetic neuropathy ([place]) 12/05/2012   Tobacco use disorder 12/05/2012   Dyslipidemia 12/05/2012   Uncontrolled type 2 diabetes mellitus with hyperglycemia, with long-term current use of insulin ([place]) 05/11/2011   Essential hypertension 05/11/2011    Past Surgical History:  Procedure Laterality Date   APPENDECTOMY     LOWER EXTREMITY ANGIOGRAPHY Right 01/27/2020   Procedure: LOWER EXTREMITY ANGIOGRAPHY;  Surgeon: Algernon Huxley, MD;  Location: [facility];  Service: Cardiovascular;  Laterality: Right;   VEIN LIGATION AND STRIPPING     of left leg    Prior to Admission medications   Medication Sig Start Date End Date Taking? Authorizing Provider  acetaminophen (TYLENOL) 325 MG tablet Take 650 mg by mouth every 6 (six) hours as needed.     [provider]  aspirin 81 MG EC tablet Take 1 tablet (81 mg total) by mouth daily. Swallow whole. 01/20/21   Iloabachie, Chioma E, NP  atorvastatin (LIPITOR) 40 MG tablet Take 1 tablet (40 mg total) by mouth once daily. 12/24/20   Iloabachie, Chioma E, NP  Blood Glucose Monitoring Suppl (RIGHTEST [id number] BLOOD GLUCOSE) w/Device KIT USE AS DIRECTED 08/04/20 08/04/21    clopidogrel (PLAVIX) 75 MG tablet Take 1 tablet (75 mg total) by mouth daily. Patient not taking: Reported on 01/20/2021 01/27/20  Algernon Huxley, MD  COMFORT EZ PEN NEEDLES 32G X 4 MM MISC USE WITH BASAGLAR AS DIRECTED 08/04/20 08/04/21    gabapentin (NEURONTIN) 300 MG capsule TAKE 1 CAPSULE BY MOUTH 3 TIMES A DAY 01/20/21 01/20/22  Iloabachie, Chioma E, NP  glucose blood (TRUE METRIX BLOOD GLUCOSE TEST) test strip Use as instructed 01/31/17   Jegede, Olugbemiga E, MD  glucose blood test strip USE TO TEST BLOOD GLUCOSE UP TO 4 TIMES PER DAY, IN THE MORNING AND AFTER MEALS Patient taking differently: USE TO TEST BLOOD GLUCOSE UP TO 4 TIMES PER DAY, IN THE MORNING AND  AFTER MEALS 08/04/20 08/04/21    Insulin Glargine (BASAGLAR KWIKPEN) 100 UNIT/ML Inject 24 Units into the skin at bedtime. 10/08/20 10/08/21  Iloabachie, Chioma E, NP  metFORMIN (GLUCOPHAGE) 1000 MG tablet TAKE ONE TABLET BY MOUTH 2 TIMES A DAY WITH A MEAL 12/24/20 12/24/21  Iloabachie, Chioma E, NP  metoprolol tartrate (LOPRESSOR) 50 MG tablet TAKE 1 TABLET BY MOUTH 2 TIMES A DAY 12/24/20 12/24/21  Iloabachie, Chioma E, NP  Rightest GL300 Lancets MISC USE AS DIRECTED 08/04/20 08/04/21      Allergies Erythromycin base, Xanax [alprazolam], Benzodiazepines, Methocarbamol, and Sulfa antibiotics  Family History  Problem Relation Age of Onset   Heart disease Mother    Diabetes Father    Heart disease Father    Diabetes Brother     Social History Social History   Tobacco Use   Smoking status: Every Day    Packs/day: 1.00    Years: 6.00    Pack years: 6.00    Types: Cigarettes   Smokeless tobacco: Never   Tobacco comments:    1.5-2  Vaping Use   Vaping Use: Never used  Substance Use Topics   Alcohol use: Yes    Alcohol/week: 4.0 standard drinks    Types: 4 Cans of beer per week    Comment: 2 times a month (1 24oz beer)   Drug use: No      Review of Systems Constitutional: No fever/chills Eyes: No visual changes. ENT: No sore throat. Cardiovascular: Denies chest pain. Respiratory: Denies shortness of breath. Gastrointestinal: No abdominal pain.  No nausea, no vomiting.  No diarrhea.  No constipation. Genitourinary: Negative for dysuria. Musculoskeletal: Negative for back pain.  Toe pain Skin: Negative for rash. Neurological: Negative for headaches, focal weakness or numbness. All other ROS negative ____________________________________________   PHYSICAL EXAM:  VITAL SIGNS: ED Triage Vitals  Enc Vitals Group     BP 05/26/21 1351 (!) 148/106     Pulse Rate 05/26/21 1351 86     Resp 05/26/21 1351 17     Temp 05/26/21 1351 98.2 F (36.8 C)     Temp Source 05/26/21 1351 Oral      SpO2 05/26/21 1351 100 %     Weight 05/26/21 1351 201 lb 4.5 oz (91.3 kg)     Height 05/26/21 1351 '5\' 11"'  (1.803 m)     Head Circumference --      Peak Flow --      Pain Score 05/26/21 1351 3     Pain Loc --      Pain Edu? --      Excl. in [place]? --     Constitutional: Alert and oriented. Well appearing and in no acute distress. Eyes: Conjunctivae are normal. EOMI. Head: Atraumatic. Nose: No congestion/rhinnorhea. Mouth/Throat: Mucous membranes are moist.   Neck: No stridor. Trachea Midline. FROM Cardiovascular: Normal rate, regular rhythm. Grossly normal  heart sounds.  Good peripheral circulation. Respiratory: Normal respiratory effort.  No retractions. Lungs CTAB. Gastrointestinal: Soft and nontender. No distention. No abdominal bruits.  Musculoskeletal: Red, warm toe noted.  Some pain up to the forefoot but no significant redness there.  No pain swelling, redness of the calf no tenderness.  Pulses intact in the foot.  Small abrasion noted on the lateral side of the right toe.  Old scars noted on the leg without any redness. Neurologic:  Normal speech and language. No gross focal neurologic deficits are appreciated.  Skin:  Skin is warm, dry and intact. No rash noted. Psychiatric: Mood and affect are normal. Speech and behavior are normal. GU: Deferred   ____________________________________________   LABS (all labs ordered are listed, but only abnormal results are displayed)  Labs Reviewed  BASIC METABOLIC PANEL - Abnormal; Notable for the following components:      Result Value   Sodium 129 (*)    Chloride 96 (*)    Glucose, Bld 197 (*)    All other components within normal limits  CBC - Abnormal; Notable for the following components:   Platelets 135 (*)    All other components within normal limits  TROPONIN I (HIGH SENSITIVITY)  TROPONIN I (HIGH SENSITIVITY)   ____________________________________________   ED ECG REPORT I, Vanessa [place], the attending physician,  personally viewed and interpreted this ECG.  Normal sinus rate of 68, no ST elevation, Q inversion aVL, normal intervals ____________________________________________  RADIOLOGY Robert Bellow, personally viewed and evaluated these images (plain radiographs) as part of my medical decision making, as well as reviewing the written report by the radiologist.  ED MD interpretation: No evidence of osteo-  Official radiology report(s): DG Foot Complete Right  Result Date: 05/26/2021 CLINICAL DATA:  Swelling and redness to the foot. Most notably about the great toe. EXAM: RIGHT FOOT COMPLETE - 3+ VIEW COMPARISON:  Nov 09, 2020. FINDINGS: Mild soft tissue swelling about the great toe. Degenerative changes in the first metatarsophalangeal joint and in the interphalangeal joint of the great toe. No acute bony abnormality. Mild soft tissue swelling suggested also within the forefoot. IMPRESSION: Soft tissue swelling about the forefoot and great toe without underlying acute bony process or findings to suggest osteomyelitis. Degenerative changes about the foot as before. Electronically Signed   By: Zetta Bills M.D.   On: 05/26/2021 17:06    ____________________________________________   PROCEDURES  Procedure(s) performed (including Critical Care):  Procedures   ____________________________________________   INITIAL IMPRESSION / ASSESSMENT AND PLAN / ED COURSE  [name] was evaluated in Emergency Department on 05/26/2021 for the symptoms described in the history of present illness. He was evaluated in the context of the global COVID-19 pandemic, which necessitated consideration that the patient might be at risk for infection with the SARS-CoV-2 virus that causes COVID-19. Institutional protocols and algorithms that pertain to the evaluation of patients at risk for COVID-19 are in a state of rapid change based on information released by regulatory bodies including the CDC and federal and state  organizations. These policies and algorithms were followed during the patient's care in the ED.     Patient comes in with concern for hyperkalemia.  Labs here were normal with normal kidney function no evidence of hyperkalemia.  Most likely had hemolysis of his initial blood work.  However on further discussion with patient is also reporting some pain in his right toe.  He has had stents placed in that leg.  He is got a pulse in the foot.  However the toe looks cellulitic in nature.  Low suspicion for gout but could also be considered.  He is got a small scab on the lateral side of the toe that he states has been healing.  It does not look with an open ulcer.  He has no fever and normal white count.  Will get x-ray to make sure no evidence of osteo-.  Suspect patient could trial some outpatient oral antibiotics and follow-up with podiatry and he understands he needs to return to the high risk for needing amputation if not getting better.  Labs are reassuring.  Normal white count, no fever.  We will cover him for cellulitis and have him follow-up with podiatry.  message the podiatrist to help patient get a earlier follow-up but also provided patient a copy of the number to call their office to get follow-up.  He understands that he will always return here if symptoms are not improved in 5 days and he cannot get follow-up.  He expressed understanding and felt comfortable with being discharged home        ____________________________________________   FINAL CLINICAL IMPRESSION(S) / ED DIAGNOSES   Final diagnoses:  Cellulitis of great toe of right foot      MEDICATIONS GIVEN DURING THIS VISIT:  Medications  clindamycin (CLEOCIN) capsule 450 mg (has no administration in time range)     ED Discharge Orders     None        Note:  This document was prepared using Dragon voice recognition software and may include unintentional dictation errors.    Vanessa [place], MD 05/26/21 [phone]

## 2021-05-26 NOTE — ED Notes (Signed)
Pt NAD, a/ox4. Pt verbalizes understanding of all DC and f/u instructions. All questions answered. Pt walks with cane to lobby ad DC.

## 2021-05-26 NOTE — ED Provider Notes (Signed)
Emergency Medicine Provider Triage Evaluation Note  [name], a 60 y.o. male  was evaluated in triage.  Pt complains of abnormal lab.  Right by his PCP to present to the ED after he had elevated potassium, greater than 5 on his routine blood work.  Patient denies any chest pain, shortness of breath, or dizziness.  Review of Systems  Positive: Abnormal K+ Negative: CP, SOB  Physical Exam  BP (!) 148/136 (BP Location: Left Arm)   Pulse 86   Temp 98.2 F (36.8 C) (Oral)   Resp 17   Ht 5\' 11"  (1.803 m)   Wt 91.3 kg   SpO2 100%   BMI 28.07 kg/m  Gen:   Awake, no distress  NAD Resp:  Normal effort CTA MSK:   Moves extremities without difficulty  Other:  CVS: RRR  Medical Decision Making  Medically screening exam initiated at 2:05 PM.  Appropriate orders placed.  was informed that the remainder of the evaluation will be completed by another provider, this initial triage assessment does not replace that evaluation, and the importance of remaining in the ED until their evaluation is complete.  Patient with a history of hypertension and diabetes, presents to the ED with reported abnormal potassium, greater than 5.   Lawrence [place]se[place], PA-C 05/26/21 1407    05/28/21, MD 05/26/21 [phone]

## 2021-05-26 NOTE — ED Triage Notes (Signed)
Pt comes into the ED via POV c/o abnormal lab with a potassium over 5.  Pt denies any chest pain, shob, or dizziness.  Pt in NAD with even and unlabored respirations and is ambulatory to triage.  Pt does admit he had a bypass graft completed in the right leg in May through [facility], but patient still has some pain there as well.

## 2021-05-26 NOTE — Discharge Instructions (Signed)
Please call the podiatrist number if you do not hear from them to help schedule an appointment.  Take the antibiotics until you get follow-up.  If the redness is not gone after 5 days and he cannot get follow-up please return to the ER to be reevaluated to see if you need additional antibiotics.  Return to the ER for fevers, worsening redness, change in color of the toe or any other concerns

## 2021-05-31 ENCOUNTER — Other Ambulatory Visit: Payer: Self-pay

## 2021-06-02 ENCOUNTER — Encounter: Payer: Self-pay | Admitting: Podiatry

## 2021-06-02 ENCOUNTER — Ambulatory Visit: Payer: Medicaid Other | Admitting: Podiatry

## 2021-06-02 ENCOUNTER — Other Ambulatory Visit: Payer: Self-pay

## 2021-06-02 DIAGNOSIS — F17200 Nicotine dependence, unspecified, uncomplicated: Secondary | ICD-10-CM

## 2021-06-02 DIAGNOSIS — L97513 Non-pressure chronic ulcer of other part of right foot with necrosis of muscle: Secondary | ICD-10-CM | POA: Diagnosis not present

## 2021-06-02 DIAGNOSIS — E1142 Type 2 diabetes mellitus with diabetic polyneuropathy: Secondary | ICD-10-CM

## 2021-06-02 DIAGNOSIS — E1165 Type 2 diabetes mellitus with hyperglycemia: Secondary | ICD-10-CM | POA: Diagnosis not present

## 2021-06-02 DIAGNOSIS — Z794 Long term (current) use of insulin: Secondary | ICD-10-CM

## 2021-06-02 DIAGNOSIS — I998 Other disorder of circulatory system: Secondary | ICD-10-CM

## 2021-06-02 MED ORDER — MUPIROCIN 2 % EX OINT
1.0000 "application " | TOPICAL_OINTMENT | Freq: Two times a day (BID) | CUTANEOUS | 2 refills | Status: DC
  Filled 2021-06-02: qty 30, 15d supply, fill #0

## 2021-06-02 NOTE — Progress Notes (Signed)
  Subjective:  Patient ID: [name], male    DOB: 07/17/1960,  MRN: [id number]  Chief Complaint  Patient presents with   Diabetic Ulcer      NP/ INFECTED RIGHT BIG TOE / NREQ    60 y.o. male presents with the above complaint. History confirmed with patient.  Presents as a transition of care from the [facility] emergency room.  He has had a right hallux ulcer for over 6 months that started in April of this year.  He previously had stents put in that leg at [facility] by vascular surgery, he underwent bypass surgery a few months ago as well.  He has not been able to follow-up with them because of changes in insurance and loss of Medicaid.  The toe is very painful.  Its had some drainage.  Objective:  Physical Exam: Nonpalpable pulses in the right side his toes are cold to touch, he has a full-thickness ulceration measuring 0.7 x 0.3 x 0.4 cm which exposed subcutaneous and deep fascia.  No exposed bone.  No purulence.  No malodor.    Radiographs: Reviewed his x-rays from emergency room no evidence of cortical erosion or osteomyelitis Assessment:   1. 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 Diabetic polyneuropathy associated with type 2 diabetes mellitus (HCC)   3. Tobacco use disorder   4. Uncontrolled type 2 diabetes mellitus with hyperglycemia, with long-term current use of insulin (HCC)   5. Ischemia of lower extremity      Plan:  Patient was evaluated and treated and all questions answered.  His ulceration appears to be quite severe as well as his PAD.  His A1c is around 9% is down from 12.2%.  Has multiple risk factors for limb loss.  Currently is taking antibiotics which I recommend he finish and begin dressing changes with mupirocin ointment which I prescribed for him.  I put an urgent referral in for vascular testing and evaluation by [facility] to evaluate the status of his bypass and if he has adequate flow to heal this ulcer.  Also  ordered an MRI to evaluate for the presence of osteomyelitis.  I discussed with him if there is osteomyelitis that he may require hallux amputation which he understands.  Discussed signs symptoms of worsening infection he should proceed to the ER if any of these develop.  Return in about 2 weeks (around 06/16/2021) for wound care, after MRI to review.

## 2021-06-02 NOTE — Patient Instructions (Signed)
[facility] will get you scheduled for a visit to evaluate the blood flow in your leg  The hospital will call to schedule your MRI  Change the dressing daily with the mupirocin ointment and gauze bandages

## 2021-06-04 ENCOUNTER — Telehealth: Payer: Self-pay | Admitting: Acute Care

## 2021-06-04 ENCOUNTER — Other Ambulatory Visit: Payer: Self-pay

## 2021-06-06 ENCOUNTER — Encounter: Payer: Self-pay | Admitting: Nurse Practitioner

## 2021-06-08 NOTE — Telephone Encounter (Signed)
Spoke with pt. He asked that I call him back the first of next week regarding lung cancer screening appt. Will call back.

## 2021-06-11 ENCOUNTER — Ambulatory Visit (INDEPENDENT_AMBULATORY_CARE_PROVIDER_SITE_OTHER): Payer: Medicaid Other | Admitting: Vascular Surgery

## 2021-06-11 ENCOUNTER — Encounter (INDEPENDENT_AMBULATORY_CARE_PROVIDER_SITE_OTHER): Payer: Medicaid Other

## 2021-06-16 ENCOUNTER — Ambulatory Visit (INDEPENDENT_AMBULATORY_CARE_PROVIDER_SITE_OTHER): Payer: Medicaid Other | Admitting: Podiatry

## 2021-06-16 DIAGNOSIS — Z91199 Patient's noncompliance with other medical treatment and regimen due to unspecified reason: Secondary | ICD-10-CM

## 2021-06-17 ENCOUNTER — Telehealth: Payer: Self-pay

## 2021-06-17 NOTE — Telephone Encounter (Signed)
PATIENT WILL CALL us WHEN HE IS READY TO SCHEDULE

## 2021-06-18 NOTE — Telephone Encounter (Signed)
Spoke with pt and he would like a call back after the first of the year. Will close this message and refer to referral notes.

## 2021-06-21 NOTE — Progress Notes (Signed)
Patient was no-show for appointment today 

## 2021-06-30 ENCOUNTER — Emergency Department: Payer: Medicaid Other

## 2021-06-30 ENCOUNTER — Encounter: Payer: Self-pay | Admitting: Emergency Medicine

## 2021-06-30 ENCOUNTER — Inpatient Hospital Stay
Admission: EM | Admit: 2021-06-30 | Discharge: 2021-07-01 | DRG: 618 | Discharge status: Against Medical Advice | Payer: Medicaid Other | Attending: Internal Medicine | Admitting: Internal Medicine

## 2021-06-30 ENCOUNTER — Other Ambulatory Visit: Payer: Self-pay

## 2021-06-30 DIAGNOSIS — E1140 Type 2 diabetes mellitus with diabetic neuropathy, unspecified: Secondary | ICD-10-CM | POA: Diagnosis present

## 2021-06-30 DIAGNOSIS — L97519 Non-pressure chronic ulcer of other part of right foot with unspecified severity: Secondary | ICD-10-CM | POA: Diagnosis present

## 2021-06-30 DIAGNOSIS — F419 Anxiety disorder, unspecified: Secondary | ICD-10-CM | POA: Diagnosis present

## 2021-06-30 DIAGNOSIS — E785 Hyperlipidemia, unspecified: Secondary | ICD-10-CM | POA: Diagnosis present

## 2021-06-30 DIAGNOSIS — L089 Local infection of the skin and subcutaneous tissue, unspecified: Secondary | ICD-10-CM | POA: Diagnosis present

## 2021-06-30 DIAGNOSIS — Z8249 Family history of ischemic heart disease and other diseases of the circulatory system: Secondary | ICD-10-CM

## 2021-06-30 DIAGNOSIS — E11621 Type 2 diabetes mellitus with foot ulcer: Secondary | ICD-10-CM

## 2021-06-30 DIAGNOSIS — Z86718 Personal history of other venous thrombosis and embolism: Secondary | ICD-10-CM | POA: Diagnosis not present

## 2021-06-30 DIAGNOSIS — I739 Peripheral vascular disease, unspecified: Secondary | ICD-10-CM

## 2021-06-30 DIAGNOSIS — Z9049 Acquired absence of other specified parts of digestive tract: Secondary | ICD-10-CM

## 2021-06-30 DIAGNOSIS — Z7982 Long term (current) use of aspirin: Secondary | ICD-10-CM | POA: Diagnosis not present

## 2021-06-30 DIAGNOSIS — E1151 Type 2 diabetes mellitus with diabetic peripheral angiopathy without gangrene: Secondary | ICD-10-CM | POA: Diagnosis present

## 2021-06-30 DIAGNOSIS — Z888 Allergy status to other drugs, medicaments and biological substances status: Secondary | ICD-10-CM | POA: Diagnosis not present

## 2021-06-30 DIAGNOSIS — L03031 Cellulitis of right toe: Secondary | ICD-10-CM | POA: Diagnosis present

## 2021-06-30 DIAGNOSIS — Z91199 Patient's noncompliance with other medical treatment and regimen due to unspecified reason: Secondary | ICD-10-CM

## 2021-06-30 DIAGNOSIS — E1169 Type 2 diabetes mellitus with other specified complication: Secondary | ICD-10-CM | POA: Diagnosis present

## 2021-06-30 DIAGNOSIS — F17210 Nicotine dependence, cigarettes, uncomplicated: Secondary | ICD-10-CM | POA: Diagnosis present

## 2021-06-30 DIAGNOSIS — R262 Difficulty in walking, not elsewhere classified: Secondary | ICD-10-CM | POA: Diagnosis present

## 2021-06-30 DIAGNOSIS — Z794 Long term (current) use of insulin: Secondary | ICD-10-CM | POA: Diagnosis not present

## 2021-06-30 DIAGNOSIS — I96 Gangrene, not elsewhere classified: Secondary | ICD-10-CM

## 2021-06-30 DIAGNOSIS — F418 Other specified anxiety disorders: Secondary | ICD-10-CM | POA: Diagnosis present

## 2021-06-30 DIAGNOSIS — Z881 Allergy status to other antibiotic agents status: Secondary | ICD-10-CM | POA: Diagnosis not present

## 2021-06-30 DIAGNOSIS — E1165 Type 2 diabetes mellitus with hyperglycemia: Secondary | ICD-10-CM | POA: Diagnosis present

## 2021-06-30 DIAGNOSIS — Z833 Family history of diabetes mellitus: Secondary | ICD-10-CM

## 2021-06-30 DIAGNOSIS — Z79899 Other long term (current) drug therapy: Secondary | ICD-10-CM | POA: Diagnosis not present

## 2021-06-30 DIAGNOSIS — L03115 Cellulitis of right lower limb: Secondary | ICD-10-CM | POA: Diagnosis present

## 2021-06-30 DIAGNOSIS — Z89431 Acquired absence of right foot: Secondary | ICD-10-CM

## 2021-06-30 DIAGNOSIS — Z7902 Long term (current) use of antithrombotics/antiplatelets: Secondary | ICD-10-CM | POA: Diagnosis not present

## 2021-06-30 DIAGNOSIS — Z5900 Homelessness unspecified: Secondary | ICD-10-CM | POA: Diagnosis not present

## 2021-06-30 DIAGNOSIS — F17200 Nicotine dependence, unspecified, uncomplicated: Secondary | ICD-10-CM | POA: Diagnosis present

## 2021-06-30 DIAGNOSIS — Z882 Allergy status to sulfonamides status: Secondary | ICD-10-CM

## 2021-06-30 DIAGNOSIS — I10 Essential (primary) hypertension: Secondary | ICD-10-CM | POA: Diagnosis present

## 2021-06-30 DIAGNOSIS — M87877 Other osteonecrosis, right toe(s): Secondary | ICD-10-CM | POA: Diagnosis not present

## 2021-06-30 DIAGNOSIS — L0390 Cellulitis, unspecified: Secondary | ICD-10-CM | POA: Diagnosis present

## 2021-06-30 DIAGNOSIS — F32A Depression, unspecified: Secondary | ICD-10-CM | POA: Diagnosis present

## 2021-06-30 LAB — CBC
HCT: 50.9 % (ref 39.0–52.0)
HCT: 47.9 % (ref 39.0–52.0)
Hemoglobin: 17.7 g/dL — ABNORMAL HIGH (ref 13.0–17.0)
Hemoglobin: 16.5 g/dL (ref 13.0–17.0)
MCH: 31.8 pg (ref 26.0–34.0)
MCH: 31.9 pg (ref 26.0–34.0)
MCHC: 34.8 g/dL (ref 30.0–36.0)
MCHC: 34.4 g/dL (ref 30.0–36.0)
MCV: 91.4 fL (ref 80.0–100.0)
MCV: 92.5 fL (ref 80.0–100.0)
Platelets: 145 10*3/uL — ABNORMAL LOW (ref 150–400)
Platelets: 128 10*3/uL — ABNORMAL LOW (ref 150–400)
RBC: 5.57 MIL/uL (ref 4.22–5.81)
RBC: 5.18 MIL/uL (ref 4.22–5.81)
RDW: 13 % (ref 11.5–15.5)
RDW: 12.9 % (ref 11.5–15.5)
WBC: 9.9 10*3/uL (ref 4.0–10.5)
WBC: 8.1 10*3/uL (ref 4.0–10.5)
nRBC: 0 % (ref 0.0–0.2)
nRBC: 0 % (ref 0.0–0.2)

## 2021-06-30 LAB — CBG MONITORING, ED: Glucose-Capillary: 319 mg/dL — ABNORMAL HIGH (ref 70–99)

## 2021-06-30 LAB — HIV ANTIBODY (ROUTINE TESTING W REFLEX): HIV Screen 4th Generation wRfx: NONREACTIVE

## 2021-06-30 LAB — BASIC METABOLIC PANEL
Anion gap: 9 (ref 5–15)
BUN: 13 mg/dL (ref 8–23)
CO2: 27 mmol/L (ref 22–32)
Calcium: 9.5 mg/dL (ref 8.9–10.3)
Chloride: 97 mmol/L — ABNORMAL LOW (ref 98–111)
Creatinine, Ser: 0.77 mg/dL (ref 0.61–1.24)
GFR, Estimated: >60 mL/min (ref >60)
Glucose, Bld: 249 mg/dL — ABNORMAL HIGH (ref 70–99)
Potassium: 4.4 mmol/L (ref 3.5–5.1)
Sodium: 133 mmol/L — ABNORMAL LOW (ref 135–145)

## 2021-06-30 LAB — LACTIC ACID, PLASMA
Lactic Acid, Venous: 1.7 mmol/L (ref 0.5–1.9)
Lactic Acid, Venous: 1.7 mmol/L (ref 0.5–1.9)

## 2021-06-30 LAB — C-REACTIVE PROTEIN: CRP: <0.5 mg/dL (ref <1.0)

## 2021-06-30 LAB — CREATININE, SERUM
Creatinine, Ser: 0.83 mg/dL (ref 0.61–1.24)
GFR, Estimated: >60 mL/min (ref >60)

## 2021-06-30 LAB — SEDIMENTATION RATE: Sed Rate: 1 mm/hr (ref 0–16)

## 2021-06-30 MED ORDER — SENNOSIDES-DOCUSATE SODIUM 8.6-50 MG PO TABS: 1.0000 | ORAL_TABLET | Freq: Every evening | ORAL | Status: DC | PRN

## 2021-06-30 MED ORDER — OXYCODONE-ACETAMINOPHEN 5-325 MG PO TABS
2.0000 | ORAL_TABLET | Freq: Once | ORAL | Status: AC
  Administered 2021-06-30: 17:00:00 2 via ORAL
  Filled 2021-06-30: qty 2

## 2021-06-30 MED ORDER — VANCOMYCIN HCL IN DEXTROSE 1-5 GM/200ML-% IV SOLN
1000.0000 mg | Freq: Once | INTRAVENOUS | Status: AC
  Administered 2021-06-30: 17:00:00 1000 mg via INTRAVENOUS
  Filled 2021-06-30: qty 200

## 2021-06-30 MED ORDER — INSULIN ASPART 100 UNIT/ML IJ SOLN
0.0000 [IU] | Freq: Three times a day (TID) | INTRAMUSCULAR | Status: DC
  Administered 2021-07-01: 17:00:00 8 [IU] via SUBCUTANEOUS
  Filled 2021-06-30 (×2): qty 1

## 2021-06-30 MED ORDER — ONDANSETRON 4 MG PO TBDP
4.0000 mg | ORAL_TABLET | Freq: Once | ORAL | Status: AC
  Administered 2021-06-30: 17:00:00 4 mg via ORAL
  Filled 2021-06-30: qty 1

## 2021-06-30 MED ORDER — INSULIN GLARGINE 100 UNIT/ML ~~LOC~~ SOLN
24.0000 [IU] | Freq: Every day | SUBCUTANEOUS | Status: DC
  Filled 2021-06-30: qty 0.24

## 2021-06-30 MED ORDER — ONDANSETRON HCL 4 MG/2ML IJ SOLN: 4.0000 mg | Freq: Four times a day (QID) | INTRAMUSCULAR | Status: DC | PRN

## 2021-06-30 MED ORDER — VANCOMYCIN HCL IN DEXTROSE 1-5 GM/200ML-% IV SOLN
1000.0000 mg | Freq: Once | INTRAVENOUS | Status: AC
Start: 2021-06-30 — End: 2021-06-30
  Administered 2021-06-30: 21:00:00 1000 mg via INTRAVENOUS
  Filled 2021-06-30: qty 200

## 2021-06-30 MED ORDER — VANCOMYCIN HCL 1250 MG/250ML IV SOLN
1250.0000 mg | Freq: Two times a day (BID) | INTRAVENOUS | Status: DC
  Administered 2021-07-01: 11:00:00 1250 mg via INTRAVENOUS
  Filled 2021-06-30 (×3): qty 250

## 2021-06-30 MED ORDER — SODIUM CHLORIDE 0.9 % IV SOLN
2.0000 g | Freq: Three times a day (TID) | INTRAVENOUS | Status: DC
  Administered 2021-07-01 (×2): 2 g via INTRAVENOUS
  Filled 2021-06-30 (×5): qty 2

## 2021-06-30 MED ORDER — ACETAMINOPHEN 325 MG PO TABS
650.0000 mg | ORAL_TABLET | Freq: Four times a day (QID) | ORAL | Status: DC | PRN
  Administered 2021-07-01 (×2): 650 mg via ORAL
  Filled 2021-06-30 (×2): qty 2

## 2021-06-30 MED ORDER — CEFEPIME HCL 2 G IJ SOLR
2.0000 g | Freq: Once | INTRAMUSCULAR | Status: AC
  Administered 2021-06-30: 17:00:00 2 g via INTRAVENOUS
  Filled 2021-06-30: qty 2

## 2021-06-30 MED ORDER — ONDANSETRON HCL 4 MG PO TABS: 4.0000 mg | ORAL_TABLET | Freq: Four times a day (QID) | ORAL | Status: DC | PRN

## 2021-06-30 MED ORDER — INSULIN GLARGINE-YFGN 100 UNIT/ML ~~LOC~~ SOLN
24.0000 [IU] | Freq: Every day | SUBCUTANEOUS | Status: DC
  Administered 2021-06-30: 22:00:00 24 [IU] via SUBCUTANEOUS
  Filled 2021-06-30 (×2): qty 0.24

## 2021-06-30 MED ORDER — BISACODYL 5 MG PO TBEC: 5.0000 mg | DELAYED_RELEASE_TABLET | Freq: Every day | ORAL | Status: DC | PRN

## 2021-06-30 MED ORDER — HEPARIN SODIUM (PORCINE) 5000 UNIT/ML IJ SOLN
5000.0000 [IU] | Freq: Three times a day (TID) | INTRAMUSCULAR | Status: DC
  Administered 2021-06-30 – 2021-07-01 (×3): 5000 [IU] via SUBCUTANEOUS
  Filled 2021-06-30 (×3): qty 1

## 2021-06-30 MED ORDER — INSULIN ASPART 100 UNIT/ML IJ SOLN
4.0000 [IU] | Freq: Three times a day (TID) | INTRAMUSCULAR | Status: DC
  Administered 2021-07-01: 17:00:00 4 [IU] via SUBCUTANEOUS
  Filled 2021-06-30: qty 1

## 2021-06-30 MED ORDER — INSULIN ASPART 100 UNIT/ML IJ SOLN
0.0000 [IU] | Freq: Every day | INTRAMUSCULAR | Status: DC
  Administered 2021-06-30: 22:00:00 4 [IU] via SUBCUTANEOUS
  Filled 2021-06-30: qty 1

## 2021-06-30 MED ORDER — METOPROLOL TARTRATE 50 MG PO TABS
50.0000 mg | ORAL_TABLET | Freq: Two times a day (BID) | ORAL | Status: DC
  Administered 2021-06-30: 22:00:00 50 mg via ORAL
  Filled 2021-06-30: qty 1

## 2021-06-30 MED ORDER — NICOTINE 21 MG/24HR TD PT24
21.0000 mg | MEDICATED_PATCH | Freq: Every day | TRANSDERMAL | Status: DC
  Filled 2021-06-30: qty 1

## 2021-06-30 NOTE — Assessment & Plan Note (Signed)
Patient reports some anxiety today but overall stable, no need for medications at this point

## 2021-06-30 NOTE — Assessment & Plan Note (Signed)
Continue basal insulin, holding metformin, added sliding scale AC/at bedtime

## 2021-06-30 NOTE — ED Triage Notes (Signed)
Presents with possible infection to foot  pain to right great toe

## 2021-06-30 NOTE — ED Notes (Signed)
Pt in mri 

## 2021-06-30 NOTE — ED Provider Notes (Signed)
°  Emergency Medicine Provider Triage Evaluation Note  [name] , a 60 y.o.male,  was evaluated in triage.  Pt complains of right big toe pain.  Been going on for the past 2 weeks.  Has had infections in the past.  Endorses history of diabetes.  Hurts to ambulate.  Denies fever/chills, leg pain, knee pain, chest pain, or shortness of breath.   Review of Systems  Positive: Right big toe pain Negative: Denies fever, chest pain, vomiting  Physical Exam   Vitals:   06/30/21 1209  BP: (!) 129/91  Pulse: (!) 103  Resp: 18  Temp: 98.2 F (36.8 C)  SpO2: 95%   Gen:   Awake, no distress   Resp:  Normal effort  MSK:   Moves extremities without difficulty  Other:    Medical Decision Making  Given the patient's initial medical screening exam, the following diagnostic evaluation has been ordered. The patient will be placed in the appropriate treatment space, once one is available, to complete the evaluation and treatment. I have discussed the plan of care with the patient and I have advised the patient that an ED physician or mid-level practitioner will reevaluate their condition after the test results have been received, as the results may give them additional insight into the type of treatment they may need.    Diagnostics: Labs, right foot x-ray.  Treatments: none immediately   Varney Daily, [place] 06/30/21 1214    Delton [place], MD 06/30/21 1726

## 2021-06-30 NOTE — H&P (Addendum)
[name] is an 60 y.o. male.   Chief Complaint: R foot pain / infection  Chief Complaint  Patient presents with   Toe Pain   HPI:  [name] is a 60 y.o. male with history of diabetes, history of DVT, hypertension, here with worsening pain of his right great toe.  The patient has had a wound over his right great toe for the last several weeks.  He was actually seen by podiatry on 12/14, and had been placed on antibiotics which he states did seem to improve his symptoms.  However, he states he did not follow-up as he should, and subsequently has developed grossly worsening aching, throbbing, severe, pain of his great toe.  Over the last several days, has become red, warm, and incredibly painful.  He said difficulty walking due to this.  Has had some chills but no known fevers.  Denies any drainage from the area.  He is currently homeless and then a poor living situation which she attributes to his reason for not being able follow-up.  No other complaints.    Hospital course: 06/30/21 X-ray right foot stable swelling and degenerative changes.  MRI ordered, results pending.  Patient started on vancomycin cefepime, patient given oxycodone. Podiatry to see in AM.     ASSESSMENT/PLAN:   Cellulitis/ulcer of right great toe due to diabetes mellitus ([place]) Given risk factors, serious concern for osteomyelitis, patient is not meeting sepsis criteria at this time.  Vancomycin and cefepime started, MRI pending to evaluate for possible osteomyelitis, ED physician spoke with podiatry, Dr. Sherryle Lis, who reports will see patient in the morning, official consult ordered.  Thankfully WBCs okay, lactate okay, sed rate okay, waiting on ESR.   ADDENDUM 06/30/21 8:39 PM podiatry reviewed MRI and will plan for likely toe amputation tomorrow morning   Peripheral vascular disease ([place]) Previous stenting, patient reports nonadherence to Plavix.  At risk for ischemia.   History of depression with  anxiety Patient reports some anxiety today but overall stable, no need for medications at this point  Essential hypertension Continue outpatient medication  Dyslipidemia Continue outpatient medication  DM (diabetes mellitus), type 2 with peripheral vascular complications (HCC) Continue basal insulin, holding metformin, added sliding scale AC/at bedtime        VTE Ppx: Heparin CODE: FULL     Past Medical History:  Diagnosis Date   Arthritis    Blood clot in vein    Diabetes mellitus    DVT (deep venous thrombosis) ([place])    Hypertension    Neuropathy    Type 2 diabetes mellitus with diabetic neuropathy, unspecified ([place]) 02/18/2020    Past Surgical History:  Procedure Laterality Date   APPENDECTOMY     LOWER EXTREMITY ANGIOGRAPHY Right 01/27/2020   Procedure: LOWER EXTREMITY ANGIOGRAPHY;  Surgeon: Algernon Huxley, MD;  Location: [facility];  Service: Cardiovascular;  Laterality: Right;   VEIN LIGATION AND STRIPPING     of left leg    Family History  Problem Relation Age of Onset   Heart disease Mother    Diabetes Father    Heart disease Father    Diabetes Brother    Social History:  reports that he has been smoking cigarettes. He has a 6.00 pack-year smoking history. He has never used smokeless tobacco. He reports current alcohol use of about 4.0 standard drinks per week. He reports that he does not use drugs.  Allergies:  Allergies  Allergen Reactions   Erythromycin Base Anxiety  Azithromycin Anxiety   Xanax [Alprazolam] Other (See Comments)    Tachycardia   Benzodiazepines     PT STATES HE CANNOT TAKE THESE BECAUSE THEY ALMOST KILLED HIM   Methocarbamol Anxiety and Other (See Comments)    Stomach cramping, weakness   Sulfa Antibiotics Nausea And Vomiting and Other (See Comments)    Cramping in stomach and hyperventilate.     (Not in a hospital admission)   Results for orders placed or performed during the hospital encounter of 06/30/21  (from the past 48 hour(s))  CBC     Status: Abnormal   Collection Time: 06/30/21 12:58 PM  Result Value Ref Range   WBC 9.9 4.0 - 10.5 K/uL   RBC 5.57 4.22 - 5.81 MIL/uL   Hemoglobin 17.7 (H) 13.0 - 17.0 g/dL   HCT 50.9 39.0 - 52.0 %   MCV 91.4 80.0 - 100.0 fL   MCH 31.8 26.0 - 34.0 pg   MCHC 34.8 30.0 - 36.0 g/dL   RDW 13.0 11.5 - 15.5 %   Platelets 145 (L) 150 - 400 K/uL   nRBC 0.0 0.0 - 0.2 %    Comment: Performed at [facility], [address] [place], [place] [address]  Basic metabolic panel     Status: Abnormal   Collection Time: 06/30/21 12:58 PM  Result Value Ref Range   Sodium 133 (L) 135 - 145 mmol/L   Potassium 4.4 3.5 - 5.1 mmol/L   Chloride 97 (L) 98 - 111 mmol/L   CO2 27 22 - 32 mmol/L   Glucose, Bld 249 (H) 70 - 99 mg/dL    Comment: Glucose reference range applies only to samples taken after fasting for at least 8 hours.   BUN 13 8 - 23 mg/dL   Creatinine, Ser 0.77 0.61 - 1.24 mg/dL   Calcium 9.5 8.9 - 10.3 mg/dL   GFR, Estimated >60 >60 mL/min    Comment: (NOTE) Calculated using the CKD-EPI Creatinine Equation (2021)    Anion gap 9 5 - 15    Comment: Performed at [facility], [address] [place], [place] [address]  Lactic acid, plasma     Status: None   Collection Time: 06/30/21 12:58 PM  Result Value Ref Range   Lactic Acid, Venous 1.7 0.5 - 1.9 mmol/L    Comment: Performed at [facility], [address] [place], [place] [address]  Sedimentation rate     Status: None   Collection Time: 06/30/21 12:58 PM  Result Value Ref Range   Sed Rate 1 0 - 16 mm/hr    Comment: Performed at [facility], [address] [place], [place] [address]  Lactic acid, plasma     Status: None   Collection Time: 06/30/21  6:57 PM  Result Value Ref Range   Lactic Acid, Venous 1.7 0.5 - 1.9 mmol/L    Comment: Performed at [facility], [address] [place], [place] [address]  CBC     Status: Abnormal   Collection  Time: 06/30/21  6:57 PM  Result Value Ref Range   WBC 8.1 4.0 - 10.5 K/uL   RBC 5.18 4.22 - 5.81 MIL/uL   Hemoglobin 16.5 13.0 - 17.0 g/dL   HCT 47.9 39.0 - 52.0 %   MCV 92.5 80.0 - 100.0 fL   MCH 31.9 26.0 - 34.0 pg   MCHC 34.4 30.0 - 36.0 g/dL   RDW 12.9 11.5 - 15.5 %   Platelets 128 (L) 150 -  400 K/uL   nRBC 0.0 0.0 - 0.2 %    Comment: Performed at [facility], [address] [place], [place] [address]  Creatinine, serum     Status: None   Collection Time: 06/30/21  6:57 PM  Result Value Ref Range   Creatinine, Ser 0.83 0.61 - 1.24 mg/dL   GFR, Estimated >60 >60 mL/min    Comment: (NOTE) Calculated using the CKD-EPI Creatinine Equation (2021) Performed at [facility], [address], [place], [place] [address]    MR FOOT RIGHT WO CONTRAST  Result Date: 06/30/2021 CLINICAL DATA:  Diabetic foot ulcer. Pain and swelling of the right great toe. EXAM: MRI OF THE RIGHT FOREFOOT WITHOUT CONTRAST TECHNIQUE: Multiplanar, multisequence MR imaging of the right foot was performed. No intravenous contrast was administered. COMPARISON:  Radiographs 06/30/2021 FINDINGS: There is a focal fluid collection noted in the subcutaneous tissues overlying the medial aspect of the proximal phalanx of the great toe. This measures a maximum of 2.7 x 1.4 cm and is consistent with an abscess. The distal aspect of the abscess is located along the interphalangeal joint. Could not exclude septic arthritis. Abnormal T1 and T2 marrow signal intensity in the proximal phalanx and to a lesser extent in the medial and proximal aspect of the distal phalanx consistent with osteomyelitis. Cellulitis mainly involving the great toe. There is also diffuse myofasciitis without definite findings for pyomyositis. The visualized midfoot bony structures are intact. Moderate degenerative changes. IMPRESSION: 1. 2.7 x 1.4 cm abscess in the subcutaneous tissues overlying the medial aspect of the proximal phalanx of  the great toe. 2. 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 Likely septic arthritis at the interphalangeal joint. 3. Cellulitis and myofasciitis without definite findings for pyomyositis. Electronically Signed   By: Marijo Sanes M.D.   On: 06/30/2021 18:29   DG Foot Complete Right  Result Date: 06/30/2021 CLINICAL DATA:  Right big toe infection. Pain and swelling in the right big toe. History of diabetes. EXAM: RIGHT FOOT COMPLETE - 3+ VIEW COMPARISON:  Radiographs 05/26/2021 and 11/09/2020 FINDINGS: The mineralization and alignment are normal. There is no evidence of acute fracture or dislocation. There is no bone destruction to suggest osteomyelitis. Stable mild degenerative changes at the 1st interphalangeal and metatarsal-phalangeal joints. Stable chronic ossific density between the bases of the 1st and 2nd metatarsals and small plantar calcaneal spur. Mild dorsal forefoot soft tissue swelling extending into the great toe. No evidence of foreign body or soft tissue emphysema. IMPRESSION: Stable forefoot soft tissue swelling and degenerative changes. No acute findings or radiographic evidence of osteomyelitis. Electronically Signed   By: Richardean Sale M.D.   On: 06/30/2021 13:51    Review of Systems  Constitutional:  Positive for fatigue. Negative for chills, diaphoresis and fever.  Respiratory:  Negative for cough, chest tightness and shortness of breath.   Cardiovascular:  Positive for leg swelling. Negative for chest pain.  Gastrointestinal:  Negative for abdominal pain.  Musculoskeletal:  Positive for joint swelling.  Neurological:  Negative for syncope and light-headedness.  Psychiatric/Behavioral: Negative.     Blood pressure 131/78, pulse 69, temperature 98.1 F (36.7 C), temperature source Oral, resp. rate 18, height _0  (1.803 m), weight 91.3 kg, SpO2 96 %. Physical Exam Constitutional:      Appearance: Normal appearance. He is normal weight.   HENT:     Head: Normocephalic and atraumatic.  Eyes:     Extraocular Movements: Extraocular movements intact.  Conjunctiva/sclera: Conjunctivae normal.  Cardiovascular:     Rate and Rhythm: Normal rate and regular rhythm.  Pulmonary:     Effort: Pulmonary effort is normal.     Breath sounds: Normal breath sounds.  Abdominal:     General: Abdomen is flat.     Palpations: Abdomen is soft.  Musculoskeletal:        General: Tenderness present.     Cervical back: Normal range of motion and neck supple.  Skin:    Findings: Erythema present.  Neurological:     General: No focal deficit present.     Mental Status: He is alert and oriented to person, place, and time.  Psychiatric:        Mood and Affect: Mood normal.        Behavior: Behavior normal.        Thought Content: Thought content normal.        Judgment: Judgment normal.     Emeterio Reeve, DO 06/30/2021, 8:31 PM

## 2021-06-30 NOTE — Assessment & Plan Note (Signed)
Continue outpatient medication

## 2021-06-30 NOTE — ED Provider Notes (Signed)
[facility] Emergency Department Provider Note  ____________________________________________   Event Date/Time   First MD Initiated Contact with Patient 06/30/21 1543     (approximate)  I have reviewed the triage vital signs and the nursing notes.   HISTORY  Chief Complaint Toe Pain    HPI [name] is a 60 y.o. male with history of diabetes, history of DVT, hypertension, here with worsening pain of his right great toe.  The patient has had a wound over his right great toe for the last several weeks.  He was actually seen by podiatry on 12/14, and had been placed on antibiotics which he states did seem to improve his symptoms.  However, he states he did not follow-up as he should, and subsequently has developed grossly worsening aching, throbbing, severe, pain of his great toe.  Over the last several days, has become red, warm, and incredibly painful.  He said difficulty walking due to this.  Has had some chills but no known fevers.  Denies any drainage from the area.  He is currently homeless and then a poor living situation which she attributes to his reason for not being able follow-up.  No other complaints.      Past Medical History:  Diagnosis Date   Arthritis    Blood clot in vein    Diabetes mellitus    DVT (deep venous thrombosis) ([place])    Hypertension    Neuropathy    Type 2 diabetes mellitus with diabetic neuropathy, unspecified ([place]) 02/18/2020    Patient Active Problem List   Diagnosis Date Noted   Hospital discharge follow-up 12/24/2020   DM (diabetes mellitus), type 2 with peripheral vascular complications ([place])    Tobacco abuse counseling    Cellulitis of right foot 11/09/2020   Ulcer of heel due to diabetes ([place]) 10/08/2020   Pain of right great toe 10/08/2020   Ischemic foot 09/29/2020   Excessive cerumen in ear canal, bilateral 05/05/2020   History of anxiety 03/11/2020   Ischemia of lower extremity 01/16/2020   Needs  assistance with community resources 09/11/2019   Claudication of right lower extremity ([place]) 10/23/2018   Neuropathic pain of ankle, left 01/25/2017   Adjustment disorder with mixed anxiety and depressed mood 08/27/2016   MDD (major depressive disorder) 08/10/2016   Hypertriglyceridemia 06/30/2016   Thrombocytopenia, unspecified ([place]) 12/06/2012   Diabetic neuropathy ([place]) 12/05/2012   Tobacco use disorder 12/05/2012   Dyslipidemia 12/05/2012   Uncontrolled type 2 diabetes mellitus with hyperglycemia, with long-term current use of insulin ([place]) 05/11/2011   Essential hypertension 05/11/2011    Past Surgical History:  Procedure Laterality Date   APPENDECTOMY     LOWER EXTREMITY ANGIOGRAPHY Right 01/27/2020   Procedure: LOWER EXTREMITY ANGIOGRAPHY;  Surgeon: Algernon Huxley, MD;  Location: [facility];  Service: Cardiovascular;  Laterality: Right;   VEIN LIGATION AND STRIPPING     of left leg    Prior to Admission medications   Medication Sig Start Date End Date Taking? Authorizing Provider  acetaminophen (TYLENOL) 325 MG tablet Take 650 mg by mouth every 6 (six) hours as needed.     [provider]  aspirin 81 MG EC tablet Take 1 tablet (81 mg total) by mouth daily. Swallow whole. 01/20/21   Iloabachie, Chioma E, NP  atorvastatin (LIPITOR) 40 MG tablet Take 1 tablet (40 mg total) by mouth once daily. 12/24/20   Iloabachie, Chioma E, NP  Blood Glucose Monitoring Suppl (RIGHTEST [id number] BLOOD  GLUCOSE) w/Device KIT USE AS DIRECTED 08/04/20 08/04/21    clopidogrel (PLAVIX) 75 MG tablet Take 1 tablet (75 mg total) by mouth daily. Patient not taking: Reported on 01/20/2021 01/27/20   Algernon Huxley, MD  COMFORT EZ PEN NEEDLES 32G X 4 MM MISC USE WITH BASAGLAR AS DIRECTED 08/04/20 08/04/21    gabapentin (NEURONTIN) 300 MG capsule TAKE 1 CAPSULE BY MOUTH 3 TIMES A DAY 01/20/21 01/20/22  Iloabachie, Chioma E, NP  glucose blood (TRUE METRIX BLOOD GLUCOSE TEST) test strip Use as instructed 01/31/17    Jegede, Olugbemiga E, MD  glucose blood test strip USE TO TEST BLOOD GLUCOSE UP TO 4 TIMES PER DAY, IN THE MORNING AND AFTER MEALS Patient taking differently: USE TO TEST BLOOD GLUCOSE UP TO 4 TIMES PER DAY, IN THE MORNING AND AFTER MEALS 08/04/20 08/04/21    Insulin Glargine (BASAGLAR KWIKPEN) 100 UNIT/ML Inject 24 Units into the skin at bedtime. 10/08/20 10/08/21  Iloabachie, Chioma E, NP  metFORMIN (GLUCOPHAGE) 1000 MG tablet TAKE ONE TABLET BY MOUTH 2 TIMES A DAY WITH A MEAL 12/24/20 12/24/21  Iloabachie, Chioma E, NP  metoprolol tartrate (LOPRESSOR) 50 MG tablet TAKE 1 TABLET BY MOUTH 2 TIMES A DAY 12/24/20 12/24/21  Iloabachie, Chioma E, NP  mupirocin ointment (BACTROBAN) 2 % Apply 1 application topically 2 (two) times daily. 06/02/21   Criselda Peaches, DPM  Rightest [phone] Lancets MISC USE AS DIRECTED 08/04/20 08/04/21      Allergies Erythromycin base, Xanax [alprazolam], Benzodiazepines, Methocarbamol, and Sulfa antibiotics  Family History  Problem Relation Age of Onset   Heart disease Mother    Diabetes Father    Heart disease Father    Diabetes Brother     Social History Social History   Tobacco Use   Smoking status: Every Day    Packs/day: 1.00    Years: 6.00    Pack years: 6.00    Types: Cigarettes   Smokeless tobacco: Never   Tobacco comments:    1.5-2  Vaping Use   Vaping Use: Never used  Substance Use Topics   Alcohol use: Yes    Alcohol/week: 4.0 standard drinks    Types: 4 Cans of beer per week    Comment: 2 times a month (1 24oz beer)   Drug use: No    Review of Systems  Review of Systems  Constitutional:  Positive for fatigue. Negative for chills and fever.  HENT:  Negative for sore throat.   Respiratory:  Negative for shortness of breath.   Cardiovascular:  Positive for leg swelling. Negative for chest pain.  Gastrointestinal:  Negative for abdominal pain.  Genitourinary:  Negative for flank pain.  Musculoskeletal:  Positive for gait problem. Negative for neck  pain.  Skin:  Positive for rash and wound.  Allergic/Immunologic: Negative for immunocompromised state.  Neurological:  Negative for weakness and numbness.  Hematological:  Does not bruise/bleed easily.  All other systems reviewed and are negative.   ____________________________________________  PHYSICAL EXAM:      VITAL SIGNS: ED Triage Vitals  Enc Vitals Group     BP 06/30/21 1209 (!) 129/91     Pulse Rate 06/30/21 1209 (!) 103     Resp 06/30/21 1209 18     Temp 06/30/21 1209 98.2 F (36.8 C)     Temp src --      SpO2 06/30/21 1209 95 %     Weight 06/30/21 1128 201 lb 4.5 oz (91.3 kg)     Height  06/30/21 1128 _0  (1.803 m)     Head Circumference --      Peak Flow --      Pain Score --      Pain Loc --      Pain Edu? --      Excl. in [place]? --      Physical Exam Vitals and nursing note reviewed.  Constitutional:      General: He is not in acute distress.    Appearance: He is well-developed.  HENT:     Head: Normocephalic and atraumatic.  Eyes:     Conjunctiva/sclera: Conjunctivae normal.  Cardiovascular:     Rate and Rhythm: Normal rate and regular rhythm.     Heart sounds: Normal heart sounds.  Pulmonary:     Effort: Pulmonary effort is normal. No respiratory distress.     Breath sounds: No wheezing.  Abdominal:     General: There is no distension.  Musculoskeletal:     Cervical back: Neck supple.  Skin:    General: Skin is warm.     Capillary Refill: Capillary refill takes less than 2 seconds.     Findings: No rash.  Neurological:     Mental Status: He is alert and oriented to person, place, and time.     Motor: No abnormal muscle tone.     LOWER EXTREMITY EXAM: Right  INSPECTION & PALPATION: Marked warmth, erythema, and slightly violaceous discoloration of the right great toe with wound along the medial aspect of the toe.  No expressible purulence but there appears to be some fluctuance under the tight area of swelling along the medial aspect of the  toe.  There is erythema streaking to the midfoot.        MOTOR:  + Motor EHL (great toe dorsiflexion) + FHL (great toe plantar flexion)  + TA (ankle dorsiflexion)  + GSC (ankle plantar flexion)  VASCULAR: 1+ dorsalis pedis and posterior tibialis pulses Capillary refill < 2 sec, toes warm and well-perfused  COMPARTMENTS: Soft, warm, well-perfused No pain with passive extension No parethesias  ____________________________________________   LABS (all labs ordered are listed, but only abnormal results are displayed)  Labs Reviewed  CBC - Abnormal; Notable for the following components:      Result Value   Hemoglobin 17.7 (*)    Platelets 145 (*)    All other components within normal limits  BASIC METABOLIC PANEL - Abnormal; Notable for the following components:   Sodium 133 (*)    Chloride 97 (*)    Glucose, Bld 249 (*)    All other components within normal limits  CULTURE, BLOOD (ROUTINE X 2)  CULTURE, BLOOD (ROUTINE X 2)  LACTIC ACID, PLASMA  LACTIC ACID, PLASMA  C-REACTIVE PROTEIN    ____________________________________________  EKG:  ________________________________________  RADIOLOGY All imaging, including plain films, CT scans, and ultrasounds, independently reviewed by me, and interpretations confirmed via formal radiology reads.  ED MD interpretation:   DG foot right: No evidence of osteo on plain film  Official radiology report(s): DG Foot Complete Right  Result Date: 06/30/2021 CLINICAL DATA:  Right big toe infection. Pain and swelling in the right big toe. History of diabetes. EXAM: RIGHT FOOT COMPLETE - 3+ VIEW COMPARISON:  Radiographs 05/26/2021 and 11/09/2020 FINDINGS: The mineralization and alignment are normal. There is no evidence of acute fracture or dislocation. There is no bone destruction to suggest osteomyelitis. Stable mild degenerative changes at the 1st interphalangeal and metatarsal-phalangeal joints. Stable chronic ossific density  between the bases of the 1st and 2nd metatarsals and small plantar calcaneal spur. Mild dorsal forefoot soft tissue swelling extending into the great toe. No evidence of foreign body or soft tissue emphysema. IMPRESSION: Stable forefoot soft tissue swelling and degenerative changes. No acute findings or radiographic evidence of osteomyelitis. Electronically Signed   By: Richardean Sale M.D.   On: 06/30/2021 13:51    ____________________________________________  PROCEDURES   Procedure(s) performed (including Critical Care):  Procedures  ____________________________________________  INITIAL IMPRESSION / MDM / [place] / ED COURSE  As part of my medical decision making, I reviewed the following data within the [facility] notes reviewed and incorporated, Old chart reviewed, Notes from prior ED visits, and [place] [employer] [place] was evaluated in Emergency Department on 06/30/2021 for the symptoms described in the history of present illness. He was evaluated in the context of the global COVID-19 pandemic, which necessitated consideration that the patient might be at risk for infection with the SARS-CoV-2 virus that causes COVID-19. Institutional protocols and algorithms that pertain to the evaluation of patients at risk for COVID-19 are in a state of rapid change based on information released by regulatory bodies including the CDC and federal and state organizations. These policies and algorithms were followed during the patient's care in the ED.  Some ED evaluations and interventions may be delayed as a result of limited staffing during the pandemic.*     Medical Decision Making: 60 year old male here with acute infection of diabetic wound to the right great toe.  Patient appears hemodynamically stable though is mildly tachycardic, though I suspect some of this could be due to pain.  No leukocytosis and normal lactic acid with no  evidence to suggest severe sepsis.  The wound is grossly infected as above with significant edema, concerning for underlying abscess and deeper infection.  Discussed with Dr. Sherryle Lis, who patient had seen in clinic.  Will obtain MRI, plan to admit for likely operative debridement in the morning.  Patient will need to be n.p.o. at midnight.  Patient updated and is agreement with this plan.  Broad-spectrum antibiotics started at this time.  ____________________________________________  FINAL CLINICAL IMPRESSION(S) / ED DIAGNOSES  Final diagnoses:  Diabetic ulcer of right great toe (HCC)  Cellulitis of toe of right foot     MEDICATIONS GIVEN DURING THIS VISIT:  Medications  vancomycin (VANCOCIN) IVPB 1000 mg/200 mL premix (has no administration in time range)    Followed by  vancomycin (VANCOCIN) IVPB 1000 mg/200 mL premix (has no administration in time range)  ceFEPIme (MAXIPIME) 2 g in sodium chloride 0.9 % 100 mL IVPB (2 g Intravenous New Bag/Given 06/30/21 1645)  oxyCODONE-acetaminophen (PERCOCET/ROXICET) 5-325 MG per tablet 2 tablet (2 tablets Oral Given 06/30/21 1646)  ondansetron (ZOFRAN-ODT) disintegrating tablet 4 mg (4 mg Oral Given 06/30/21 1646)     ED Discharge Orders     None        Note:  This document was prepared using Dragon voice recognition software and may include unintentional dictation errors.   Duffy Bruce, MD 06/30/21 [phone]

## 2021-06-30 NOTE — Progress Notes (Addendum)
Pharmacy Antibiotic Note  [name] is a 60 y.o. male admitted on 06/30/2021 with cellulitis with serious concern for osteomyelitis  Pharmacy has been consulted for vancomycin dosing. His renal function is at what appears to be his baseline level.  Plan:   1) start vancomycin 1000 mg IV x 1 to complete loading dose then 1250 mg IV every 12 hours  Goal AUC 400-550 Expected AUC: 423 SCr used: 0.80 mg/dL (rounded up) Ke: 3.47 h-1, T1/2 7.7h Follow renal function for needed dose adjustments  2) start cefepime 2 grams IV every 8 hours   Height: 5\' 11"  (180.3 cm) Weight: 91.3 kg (201 lb 4.5 oz) IBW/kg (Calculated) : 75.3  Temp (24hrs), Avg:98.2 F (36.8 C), Min:98.1 F (36.7 C), Max:98.2 F (36.8 C)  Recent Labs  Lab 06/30/21 1258  WBC 9.9  CREATININE 0.77  LATICACIDVEN 1.7    Estimated Creatinine Clearance: 112.1 mL/min (by C-G formula based on SCr of 0.77 mg/dL).    Allergies  Allergen Reactions   Erythromycin Base Anxiety   Azithromycin Anxiety   Xanax [Alprazolam] Other (See Comments)    Tachycardia   Benzodiazepines     PT STATES HE CANNOT TAKE THESE BECAUSE THEY ALMOST KILLED HIM   Methocarbamol Anxiety and Other (See Comments)    Stomach cramping, weakness   Sulfa Antibiotics Nausea And Vomiting and Other (See Comments)    Cramping in stomach and hyperventilate.     Antimicrobials this admission: vancomycin 12/28 >>  cefepime 12/28 >>  Microbiology results: 12/28 BCx: pending  Thank you for allowing pharmacy to be a part of this patients care.  1/29 06/30/2021 7:32 PM

## 2021-06-30 NOTE — Progress Notes (Signed)
PHARMACY -  BRIEF ANTIBIOTIC NOTE   Pharmacy has received consult(s) for vancomycin and cefepime from an ED provider.  The patient's profile has been reviewed for ht/wt/allergies/indication/available labs.    One time orders placed for: vancomycin 2,000 mg loading dose (1 g + 1 g) cefepime 2 grams x 1  Further antibiotics/pharmacy consults should be ordered by admitting physician if indicated.                       Thank you, Jaynie Bream, PharmD Pharmacy Resident  06/30/2021 4:17 PM

## 2021-06-30 NOTE — Assessment & Plan Note (Addendum)
Given risk factors, serious concern for osteomyelitis, patient is not meeting sepsis criteria at this time.  Vancomycin and cefepime started, MRI pending to evaluate for possible osteomyelitis, ED physician spoke with podiatry, Dr. Sherryle Lis, who reports will see patient in the morning, official consult ordered.  Thankfully WBCs okay, lactate okay, sed rate okay, waiting on ESR.

## 2021-06-30 NOTE — Assessment & Plan Note (Signed)
Previous stenting, patient reports nonadherence to Plavix.  At risk for ischemia.

## 2021-07-01 ENCOUNTER — Encounter
Admission: EM | Discharge status: Against Medical Advice | Payer: Self-pay | Source: Home / Self Care | Attending: Internal Medicine

## 2021-07-01 ENCOUNTER — Inpatient Hospital Stay: Payer: Medicaid Other | Admitting: Registered Nurse

## 2021-07-01 ENCOUNTER — Inpatient Hospital Stay: Payer: Medicaid Other

## 2021-07-01 ENCOUNTER — Encounter: Payer: Self-pay | Admitting: Osteopathic Medicine

## 2021-07-01 DIAGNOSIS — I739 Peripheral vascular disease, unspecified: Secondary | ICD-10-CM

## 2021-07-01 DIAGNOSIS — M87877 Other osteonecrosis, right toe(s): Secondary | ICD-10-CM

## 2021-07-01 DIAGNOSIS — L97519 Non-pressure chronic ulcer of other part of right foot with unspecified severity: Secondary | ICD-10-CM | POA: Diagnosis not present

## 2021-07-01 DIAGNOSIS — E11621 Type 2 diabetes mellitus with foot ulcer: Secondary | ICD-10-CM | POA: Diagnosis not present

## 2021-07-01 HISTORY — PX: AMPUTATION: SHX166

## 2021-07-01 LAB — CBC
HCT: 47.6 % (ref 39.0–52.0)
Hemoglobin: 16.7 g/dL (ref 13.0–17.0)
MCH: 32.2 pg (ref 26.0–34.0)
MCHC: 35.1 g/dL (ref 30.0–36.0)
MCV: 91.7 fL (ref 80.0–100.0)
Platelets: 133 10*3/uL — ABNORMAL LOW (ref 150–400)
RBC: 5.19 MIL/uL (ref 4.22–5.81)
RDW: 13 % (ref 11.5–15.5)
WBC: 6.8 10*3/uL (ref 4.0–10.5)
nRBC: 0 % (ref 0.0–0.2)

## 2021-07-01 LAB — BASIC METABOLIC PANEL
Anion gap: 7 (ref 5–15)
BUN: 14 mg/dL (ref 8–23)
CO2: 25 mmol/L (ref 22–32)
Calcium: 8.8 mg/dL — ABNORMAL LOW (ref 8.9–10.3)
Chloride: 102 mmol/L (ref 98–111)
Creatinine, Ser: 0.72 mg/dL (ref 0.61–1.24)
GFR, Estimated: >60 mL/min (ref >60)
Glucose, Bld: 171 mg/dL — ABNORMAL HIGH (ref 70–99)
Potassium: 4 mmol/L (ref 3.5–5.1)
Sodium: 134 mmol/L — ABNORMAL LOW (ref 135–145)

## 2021-07-01 LAB — HEMOGLOBIN A1C
Hgb A1c MFr Bld: 10 % — ABNORMAL HIGH (ref 4.8–5.6)
Mean Plasma Glucose: 240.3 mg/dL

## 2021-07-01 LAB — CBG MONITORING, ED
Glucose-Capillary: 190 mg/dL — ABNORMAL HIGH (ref 70–99)
Glucose-Capillary: 152 mg/dL — ABNORMAL HIGH (ref 70–99)

## 2021-07-01 LAB — GLUCOSE, CAPILLARY: Glucose-Capillary: 273 mg/dL — ABNORMAL HIGH (ref 70–99)

## 2021-07-01 SURGERY — AMPUTATION DIGIT
Anesthesia: General | Laterality: Right

## 2021-07-01 MED ORDER — LIDOCAINE HCL (PF) 2 % IJ SOLN
INTRAMUSCULAR | Status: AC
  Filled 2021-07-01: qty 5

## 2021-07-01 MED ORDER — OXYCODONE HCL 5 MG/5ML PO SOLN: 5.0000 mg | Freq: Once | ORAL | Status: DC | PRN

## 2021-07-01 MED ORDER — SODIUM CHLORIDE 0.9 % IV SOLN: INTRAVENOUS | Status: DC | PRN

## 2021-07-01 MED ORDER — LIDOCAINE HCL 1 % IJ SOLN
INTRAMUSCULAR | Status: DC | PRN
  Administered 2021-07-01: 10 mL

## 2021-07-01 MED ORDER — ONDANSETRON HCL 4 MG/2ML IJ SOLN: 4.0000 mg | Freq: Once | INTRAMUSCULAR | Status: DC | PRN

## 2021-07-01 MED ORDER — FENTANYL CITRATE (PF) 100 MCG/2ML IJ SOLN
INTRAMUSCULAR | Status: AC
  Filled 2021-07-01: qty 2

## 2021-07-01 MED ORDER — PROPOFOL 10 MG/ML IV BOLUS
INTRAVENOUS | Status: DC | PRN
  Administered 2021-07-01: 20 mg via INTRAVENOUS

## 2021-07-01 MED ORDER — 0.9 % SODIUM CHLORIDE (POUR BTL) OPTIME
TOPICAL | Status: DC | PRN
  Administered 2021-07-01: 13:00:00 1000 mL

## 2021-07-01 MED ORDER — LIDOCAINE HCL (PF) 1 % IJ SOLN
INTRAMUSCULAR | Status: AC
  Filled 2021-07-01: qty 30

## 2021-07-01 MED ORDER — MIDAZOLAM HCL 2 MG/2ML IJ SOLN
INTRAMUSCULAR | Status: DC | PRN
  Administered 2021-07-01: 2 mg via INTRAVENOUS

## 2021-07-01 MED ORDER — OXYCODONE HCL 5 MG PO TABS: 5.0000 mg | ORAL_TABLET | Freq: Once | ORAL | Status: DC | PRN

## 2021-07-01 MED ORDER — HYDROCODONE-ACETAMINOPHEN 5-325 MG PO TABS
1.0000 | ORAL_TABLET | ORAL | Status: DC | PRN
Start: 2021-07-01 — End: 2021-07-02
  Administered 2021-07-01: 19:00:00 2 via ORAL
  Filled 2021-07-01: qty 2

## 2021-07-01 MED ORDER — PROPOFOL 500 MG/50ML IV EMUL
INTRAVENOUS | Status: DC | PRN
  Administered 2021-07-01: 75 ug/kg/min via INTRAVENOUS

## 2021-07-01 MED ORDER — DEXMEDETOMIDINE (PRECEDEX) IN NS 20 MCG/5ML (4 MCG/ML) IV SYRINGE
PREFILLED_SYRINGE | INTRAVENOUS | Status: DC | PRN
  Administered 2021-07-01: 4 ug via INTRAVENOUS
  Administered 2021-07-01: 8 ug via INTRAVENOUS

## 2021-07-01 MED ORDER — LIDOCAINE-EPINEPHRINE 1 %-1:100000 IJ SOLN
INTRAMUSCULAR | Status: AC
  Filled 2021-07-01: qty 1

## 2021-07-01 MED ORDER — FENTANYL CITRATE (PF) 100 MCG/2ML IJ SOLN: 25.0000 ug | INTRAMUSCULAR | Status: DC | PRN

## 2021-07-01 MED ORDER — BUPIVACAINE HCL (PF) 0.5 % IJ SOLN
INTRAMUSCULAR | Status: AC
  Filled 2021-07-01: qty 30

## 2021-07-01 MED ORDER — FENTANYL CITRATE (PF) 100 MCG/2ML IJ SOLN
INTRAMUSCULAR | Status: DC | PRN
  Administered 2021-07-01 (×2): 50 ug via INTRAVENOUS

## 2021-07-01 MED ORDER — PROPOFOL 10 MG/ML IV BOLUS
INTRAVENOUS | Status: AC
  Filled 2021-07-01: qty 20

## 2021-07-01 SURGICAL SUPPLY — 39 items
BAG COUNTER SPONGE SURGICOUNT (BAG) IMPLANT
BAG SPNG CNTER NS LX DISP (BAG)
BAG SURGICOUNT SPONGE COUNTING (BAG)
BLADE OSC/SAGITTAL MD 5.5X18 (BLADE) IMPLANT
BLADE SURG 15 STRL LF DISP TIS (BLADE) ×2 IMPLANT
BLADE SURG 15 STRL SS (BLADE)
BLADE SURG MINI STRL (BLADE) ×4 IMPLANT
BNDG ELASTIC 4X5.8 VLCR STR LF (GAUZE/BANDAGES/DRESSINGS) ×4 IMPLANT
BNDG GAUZE ELAST 4 BULKY (GAUZE/BANDAGES/DRESSINGS) ×4 IMPLANT
COVER LIGHT HANDLE STERIS (MISCELLANEOUS) ×8 IMPLANT
DURAPREP 26ML APPLICATOR (WOUND CARE) ×4 IMPLANT
ELECT REM PT RETURN 9FT ADLT (ELECTROSURGICAL) ×3
ELECTRODE REM PT RTRN 9FT ADLT (ELECTROSURGICAL) ×2 IMPLANT
GAUZE 4X4 16PLY ~~LOC~~+RFID DBL (SPONGE) ×4 IMPLANT
GAUZE SPONGE 4X4 12PLY STRL (GAUZE/BANDAGES/DRESSINGS) IMPLANT
GAUZE XEROFORM 1X8 LF (GAUZE/BANDAGES/DRESSINGS) ×2 IMPLANT
GLOVE SURG ENC MOIS LTX SZ7 (GLOVE) ×4 IMPLANT
GLOVE SURG UNDER POLY LF SZ7.5 (GLOVE) ×4 IMPLANT
GOWN STRL REUS W/ TWL LRG LVL3 (GOWN DISPOSABLE) ×4 IMPLANT
GOWN STRL REUS W/ TWL XL LVL3 (GOWN DISPOSABLE) ×2 IMPLANT
GOWN STRL REUS W/TWL LRG LVL3 (GOWN DISPOSABLE) ×6
GOWN STRL REUS W/TWL XL LVL3 (GOWN DISPOSABLE) ×3
HANDPIECE INTERPULSE COAX TIP (DISPOSABLE) ×3
IV NS 1000ML (IV SOLUTION) ×3
IV NS 1000ML BAXH (IV SOLUTION) ×2 IMPLANT
KIT BASIN OR (CUSTOM PROCEDURE TRAY) ×4 IMPLANT
KIT TURNOVER KIT A (KITS) ×2 IMPLANT
MANIFOLD NEPTUNE II (INSTRUMENTS) ×4 IMPLANT
NEEDLE HYPO 22GX1.5 SAFETY (NEEDLE) IMPLANT
NS IRRIG 1000ML POUR BTL (IV SOLUTION) ×4 IMPLANT
PACK DRAINAGE HVY KERLIX W/SPG (MISCELLANEOUS) ×4 IMPLANT
PACK EXTREMITY ARMC (MISCELLANEOUS) ×4 IMPLANT
PAD ARMBOARD 7.5X6 YLW CONV (MISCELLANEOUS) ×8 IMPLANT
PULSAVAC PLUS IRRIG FAN TIP (DISPOSABLE)
SET HNDPC FAN SPRY TIP SCT (DISPOSABLE) ×2 IMPLANT
STAPLER SKIN PROX 35W (STAPLE) ×4 IMPLANT
SUT PROLENE 2 0 FS (SUTURE) ×4 IMPLANT
SUT PROLENE 3 0 PS 2 (SUTURE) ×4 IMPLANT
TIP FAN IRRIG PULSAVAC PLUS (DISPOSABLE) IMPLANT

## 2021-07-01 NOTE — Anesthesia Postprocedure Evaluation (Signed)
Anesthesia Post Note  Patient: [name]  Procedure(s) Performed: AMPUTATION DIGIT (Right)  Patient location during evaluation: PACU Anesthesia Type: General Level of consciousness: awake and alert Pain management: pain level controlled Vital Signs Assessment: post-procedure vital signs reviewed and stable Respiratory status: spontaneous breathing, nonlabored ventilation, respiratory function stable and patient connected to nasal cannula oxygen Cardiovascular status: blood pressure returned to baseline and stable Postop Assessment: no apparent nausea or vomiting Anesthetic complications: no   No notable events documented.   Last Vitals:  Vitals:   07/01/21 1345 07/01/21 1414  BP: 134/77 (!) 157/81  Pulse: (!) 58 60  Resp: 10 18  Temp:  36.5 C  SpO2: 98% 100%    Last Pain:  Vitals:   07/01/21 1328  TempSrc:   PainSc: 0-No pain                 Corinda Gubler

## 2021-07-01 NOTE — Transfer of Care (Signed)
Immediate Anesthesia Transfer of Care Note  Patient: KAIMANI CLAYSON  Procedure(s) Performed: AMPUTATION DIGIT (Right)  Patient Location: PACU  Anesthesia Type:General  Level of Consciousness: awake, drowsy and patient cooperative  Airway & Oxygen Therapy: Patient Spontanous Breathing  Post-op Assessment: Report given to RN and Post -op Vital signs reviewed and stable  Post vital signs: Reviewed and stable  Last Vitals:  Vitals Value Taken Time  BP 120/73 07/01/21 1257  Temp    Pulse 88 07/01/21 1300  Resp 14 07/01/21 1300  SpO2 95 % 07/01/21 1300  Vitals shown include unvalidated device data.  Last Pain:  Vitals:   07/01/21 1126  TempSrc: Temporal  PainSc: 10-Worst pain ever         Complications: No notable events documented.

## 2021-07-01 NOTE — Interval H&P Note (Signed)
History and Physical Interval Note:  07/01/2021 11:49 AM  [name]  has presented today for surgery, with the diagnosis of Osteomylitis Right Hallux.  The various methods of treatment have been discussed with the patient and family. After consideration of risks, benefits and other options for treatment, the patient has consented to  Procedure(s): AMPUTATION DIGIT (Right) as a surgical intervention.  The patient's history has been reviewed, patient examined, no change in status, stable for surgery.  I have reviewed the patient's chart and labs.  Questions were answered to the patient's satisfaction.     [name]

## 2021-07-01 NOTE — Consult Note (Signed)
[facility] Vascular Consult Note  MRN : [id number]  [name] is a 60 y.o. (1960/11/28) male who presents with chief complaint of  Chief Complaint  Patient presents with   Toe Pain  .  History of Present Illness: I am asked to see the patient by Dr. Allena Katz for a long history of peripheral arterial disease with osteomyelitis of the right great toe.  He underwent right great toe amputation today for osteomyelitis and was found to have extremely poor bleeding at the time of surgery.  He has a very complex and difficult history.  He has severe peripheral arterial disease and severe noncompliant issues.  He has had multiple procedures planned in years past and generally would not show up for outpatient procedures.  We did perform an intervention in 2021.  This ultimately failed for a variety of reasons including his noncompliance, and he underwent a femoral to peroneal bypass at [facility] about 6 months ago.  As best I can tell, this has not been checked in many months.  Given the poor bleeding at surgery today, the concern would be that this is occluded.  If he had only peroneal runoff initially, the chance of salvage of this are going to be very small.  He continues to have a lot of pain in the foot.  He says he has suffered with this for many weeks to months.  Current Facility-Administered Medications  Medication Dose Route Frequency Provider Last Rate Last Admin   acetaminophen (TYLENOL) tablet 650 mg  650 mg Oral Q6H PRN [name], DPM   650 mg at 07/01/21 1613   bisacodyl (DULCOLAX) EC tablet 5 mg  5 mg Oral Daily PRN [name], DPM       ceFEPIme (MAXIPIME) 2 g in sodium chloride 0.9 % 100 mL IVPB  2 g Intravenous Q8H [name], DPM 200 mL/hr at 07/01/21 1601 2 g at 07/01/21 1601   heparin injection 5,000 Units  5,000 Units Subcutaneous Q8H [name], DPM   5,000 Units at 07/01/21 1555   HYDROcodone-acetaminophen (NORCO/VICODIN) 5-325 MG per tablet 1-2  tablet  1-2 tablet Oral Q4H PRN Arnetha Courser, MD   2 tablet at 07/01/21 1844   insulin aspart (novoLOG) injection 0-15 Units  0-15 Units Subcutaneous TID WC [name], DPM   8 Units at 07/01/21 1704   insulin aspart (novoLOG) injection 0-5 Units  0-5 Units Subcutaneous QHS [name], DPM   4 Units at 06/30/21 2158   insulin aspart (novoLOG) injection 4 Units  4 Units Subcutaneous TID WC [name], DPM   4 Units at 07/01/21 1703   insulin glargine-yfgn (SEMGLEE) injection 24 Units  24 Units Subcutaneous QHS [name], DPM   24 Units at 06/30/21 2143   metoprolol tartrate (LOPRESSOR) tablet 50 mg  50 mg Oral BID [name], DPM   50 mg at 06/30/21 2156   nicotine (NICODERM CQ - dosed in mg/24 hours) patch 21 mg  21 mg Transdermal Daily Nicholes Rough P, DPM       ondansetron [facility]) tablet 4 mg  4 mg Oral Q6H PRN [name], DPM       Or   ondansetron (ZOFRAN) injection 4 mg  4 mg Intravenous Q6H PRN [name], DPM       senna-docusate (Senokot-S) tablet 1 tablet  1 tablet Oral QHS PRN [name], DPM  vancomycin (VANCOREADY) IVPB 1250 mg/250 mL  1,250 mg Intravenous Q12H [name], DPM   Stopped at 07/01/21 1229    Past Medical History:  Diagnosis Date   Arthritis    Blood clot in vein    Diabetes mellitus    DVT (deep venous thrombosis) (HCC)    Hypertension    Neuropathy    Type 2 diabetes mellitus with diabetic neuropathy, unspecified (HCC) 02/18/2020    Past Surgical History:  Procedure Laterality Date   AMPUTATION Right 07/01/2021   Procedure: AMPUTATION DIGIT;  Surgeon: [name], DPM;  Location: [facility] ORS;  Service: Podiatry;  Laterality: Right;   APPENDECTOMY     LOWER EXTREMITY ANGIOGRAPHY Right 01/27/2020   Procedure: LOWER EXTREMITY ANGIOGRAPHY;  Surgeon: Annice Needy, MD;  Location: [facility] INVASIVE CV LAB;  Service: Cardiovascular;  Laterality: Right;   VEIN LIGATION AND STRIPPING     of left leg     Social History    Tobacco Use   Smoking status: Every Day    Packs/day: 1.00    Years: 6.00    Pack years: 6.00    Types: Cigarettes   Smokeless tobacco: Never   Tobacco comments:    1.5-2  Vaping Use   Vaping Use: Never used  Substance Use Topics   Alcohol use: Yes    Alcohol/week: 4.0 standard drinks    Types: 4 Cans of beer per week    Comment: 2 times a month (1 24oz beer)   Drug use: No     Family History  Problem Relation Age of Onset   Heart disease Mother    Diabetes Father    Heart disease Father    Diabetes Brother     Allergies  Allergen Reactions   Erythromycin Base Anxiety   Azithromycin Anxiety   Xanax [Alprazolam] Other (See Comments)    Tachycardia   Benzodiazepines     PT STATES HE CANNOT TAKE THESE BECAUSE THEY ALMOST KILLED HIM   Methocarbamol Anxiety and Other (See Comments)    Stomach cramping, weakness   Sulfa Antibiotics Nausea And Vomiting and Other (See Comments)    Cramping in stomach and hyperventilate.      REVIEW OF SYSTEMS (Negative unless checked)  Constitutional: Weight loss  Fever  Chills Cardiac: Chest pain   Chest pressure   Palpitations   Shortness of breath when laying flat   Shortness of breath at rest   Shortness of breath with exertion. Vascular:  Pain in legs with walking   Pain in legs at rest   Pain in legs when laying flat   Claudication   Pain in feet when walking  Pain in feet at rest  Pain in feet when laying flat   History of DVT   Phlebitis   Swelling in legs   Varicose veins   Non-healing ulcers Pulmonary:   Uses home oxygen   Productive cough   Hemoptysis   Wheeze  COPD   Asthma Neurologic:  Dizziness  Blackouts   Seizures   History of stroke   History of TIA  Aphasia   Temporary blindness   Dysphagia   Weakness or numbness in arms   Weakness or numbness in legs Musculoskeletal:  Arthritis   Joint swelling   Joint pain   Low back pain Hematologic:   Easy bruising  Easy bleeding   Hypercoagulable state   Anemic  Hepatitis Gastrointestinal:  Blood in stool   Vomiting blood  Gastroesophageal reflux/heartburn   Difficulty  swallowing. Genitourinary:  [] Chronic kidney disease   [] Difficult urination  [] Frequent urination  [] Burning with urination   [] Blood in urine Skin:  [] Rashes   [] Ulcers   [] Wounds Psychological:  [] History of anxiety   []  History of major depression.  Physical Examination  Vitals:   07/01/21 1330 07/01/21 1345 07/01/21 1414 07/01/21 1636  BP: 135/83 134/77 (!) 157/81 (!) 152/72  Pulse: 72 (!) 58 60 96  Resp: 13 10 18 18   Temp:   97.7 F (36.5 C) 97.9 F (36.6 C)  TempSrc:      SpO2: 97% 98% 100% 99%  Weight:      Height:       Body mass index is 28.07 kg/m. Gen:  WD/WN, NAD. Appears older than stated age. Head: [place]/AT, No temporalis wasting.  Ear/Nose/Throat: Hearing grossly intact, nares w/o erythema or drainage, oropharynx w/o Erythema/Exudate Eyes: Sclera non-icteric, conjunctiva clear Neck: Trachea midline.  No JVD.  Pulmonary:  Good air movement, respirations not labored, equal bilaterally.  Cardiac: RRR, normal S1, S2. Vascular:  Vessel Right Left  Radial Palpable Palpable                          PT Not Palpable Not Palpable  DP Not Palpable Not Palpable   Gastrointestinal: soft, non-tender/non-distended. No guarding/reflex.  Musculoskeletal: M/S 5/5 throughout.  Right foot is currently dressed.  1-2+ right lower extremity edema.  Prominent varicosities are present on the left with 1+ left lower extremity edema. Neurologic: Sensation grossly intact in extremities.  Symmetrical.  Speech is fluent. Motor exam as listed above. Psychiatric: Judgment intact, Mood & affect appropriate for pt's clinical situation. Dermatologic: Right great toe amputation wound currently dressed.     CBC Lab Results  Component Value Date   WBC 6.8 07/01/2021   HGB 16.7 07/01/2021   HCT 47.6  07/01/2021   MCV 91.7 07/01/2021   PLT 133 (L) 07/01/2021    BMET    Component Value Date/Time   NA 134 (L) 07/01/2021 0722   NA 138 09/18/2019 1221   NA 135 (L) 08/14/2013 1710   K 4.0 07/01/2021 0722   K 3.8 08/14/2013 1710   CL 102 07/01/2021 0722   CL 103 08/14/2013 1710   CO2 25 07/01/2021 0722   CO2 32 08/14/2013 1710   GLUCOSE 171 (H) 07/01/2021 0722   GLUCOSE 116 (H) 08/14/2013 1710   BUN 14 07/01/2021 0722   BUN 12 09/18/2019 1221   BUN 11 08/14/2013 1710   CREATININE 0.72 07/01/2021 0722   CREATININE 0.82 01/21/2016 1523   CALCIUM 8.8 (L) 07/01/2021 0722   CALCIUM 9.3 08/14/2013 1710   GFRNONAA >60 07/01/2021 0722   GFRNONAA >60 08/14/2013 1710   GFRNONAA >89 05/24/2013 1211   GFRAA >60 01/27/2020 1151   GFRAA >60 08/14/2013 1710   GFRAA >89 05/24/2013 1211   Estimated Creatinine Clearance: 112.1 mL/min (by C-G formula based on SCr of 0.72 mg/dL).  COAG Lab Results  Component Value Date   INR 0.9 11/09/2020   INR 1.0 09/29/2020    Radiology MR FOOT RIGHT WO CONTRAST  Result Date: 06/30/2021 CLINICAL DATA:  Diabetic foot ulcer. Pain and swelling of the right great toe. EXAM: MRI OF THE RIGHT FOREFOOT WITHOUT CONTRAST TECHNIQUE: Multiplanar, multisequence MR imaging of the right foot was performed. No intravenous contrast was administered. COMPARISON:  Radiographs 06/30/2021 FINDINGS: There is a focal fluid collection noted in the subcutaneous tissues overlying the medial aspect of  the proximal phalanx of the great toe. This measures a maximum of 2.7 x 1.4 cm and is consistent with an abscess. The distal aspect of the abscess is located along the interphalangeal joint. Could not exclude septic arthritis. Abnormal T1 and T2 marrow signal intensity in the proximal phalanx and to a lesser extent in the medial and proximal aspect of the distal phalanx consistent with osteomyelitis. Cellulitis mainly involving the great toe. There is also diffuse myofasciitis without  definite findings for pyomyositis. The visualized midfoot bony structures are intact. Moderate degenerative changes. IMPRESSION: 1. 2.7 x 1.4 cm abscess in the subcutaneous tissues overlying the medial aspect of the proximal phalanx of the great toe. 2. 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 Likely septic arthritis at the interphalangeal joint. 3. Cellulitis and myofasciitis without definite findings for pyomyositis. Electronically Signed   By: Rudie Meyer M.D.   On: 06/30/2021 18:29   DG Foot Complete Right  Result Date: 07/01/2021 CLINICAL DATA:  Postop amputation right foot EXAM: RIGHT FOOT COMPLETE - 3+ VIEW COMPARISON:  06/30/2021 FINDINGS: Interval amputation of the great toe at the level of the MTP joint. No fracture or osteomyelitis is identified in the first metatarsal. Otherwise negative. IMPRESSION: Amputation at the level the first metatarsal phalangeal joint. No acute skeletal abnormality. Electronically Signed   By: Marlan Palau M.D.   On: 07/01/2021 14:36   DG Foot Complete Right  Result Date: 06/30/2021 CLINICAL DATA:  Right big toe infection. Pain and swelling in the right big toe. History of diabetes. EXAM: RIGHT FOOT COMPLETE - 3+ VIEW COMPARISON:  Radiographs 05/26/2021 and 11/09/2020 FINDINGS: The mineralization and alignment are normal. There is no evidence of acute fracture or dislocation. There is no bone destruction to suggest osteomyelitis. Stable mild degenerative changes at the 1st interphalangeal and metatarsal-phalangeal joints. Stable chronic ossific density between the bases of the 1st and 2nd metatarsals and small plantar calcaneal spur. Mild dorsal forefoot soft tissue swelling extending into the great toe. No evidence of foreign body or soft tissue emphysema. IMPRESSION: Stable forefoot soft tissue swelling and degenerative changes. No acute findings or radiographic evidence of osteomyelitis. Electronically Signed   By: Carey Bullocks M.D.   On: 06/30/2021 13:51      Assessment/Plan 1.  PAD with ulceration and infection right foot.  He had a femoral to distal bypass done at [facility] about 6 months ago and the bleeding at surgery today was found to be extremely poor.  Would suspect the bypass is potentially occluded.  This is clearly a critical and limb threatening situation with a very high risk of limb loss.  Limb loss is actually the most likely option at this point.  I have recommended an angiogram to be performed tomorrow.  We tried to have this performed today, but he had eaten lunch and he requires general anesthesia for even angiographic procedures and that was not available with him having eaten lunch.  He says he wants to go home and think about this which I think is a terrible idea.  He historically has been extremely noncompliant and will not show back up if he goes home.  I have told him that he can have the procedure done tomorrow while he is in the hospital or I will take it that he does not want to have it done and he should follow-up with his vascular surgeons at [facility] with the expectation he will need an amputation likely above the knee.  I discussed that scheduling this as an outpatient has been done many times in the past and he never shows up.  That is not how the situation should be handled, and I am not going to put him on the outpatient schedule.  He should have the procedure done tomorrow, and if he does not have it done tomorrow by his request he is essentially refusing treatment and should be discharged with the expectation of an above-knee amputation at some point. 2.  Osteomyelitis right foot.  Secondary to poor perfusion and chronic ulceration. 3.  Poorly controlled diabetes mellitus with hyperglycemia and use of insulin. blood glucose control important in reducing the progression of atherosclerotic disease. Also, involved in wound healing. On appropriate medications. 4.  Tobacco abuse.  Clearly a cause of his  severe peripheral arterial disease.  Patient has been noncompliant with smoking cessation and continues to smoke regularly.    [name], MD  07/01/2021 7:37 PM    This note was created with Dragon medical transcription system.  Any error is purely unintentional

## 2021-07-01 NOTE — Anesthesia Preprocedure Evaluation (Addendum)
Anesthesia Evaluation  Patient identified by MRN, date of birth, ID band Patient awake    Reviewed: Allergy & Precautions, NPO status , Patient's Chart, lab work & pertinent test results  History of Anesthesia Complications Negative for: history of anesthetic complications  Airway Mallampati: II  TM Distance: >3 FB Neck ROM: Full    Dental no notable dental hx. (+) Teeth Intact   Pulmonary neg sleep apnea, neg COPD, Current Smoker and Patient abstained from smoking.,    Pulmonary exam normal breath sounds clear to auscultation       Cardiovascular Exercise Tolerance: Good METShypertension, + Peripheral Vascular Disease  (-) CAD and (-) Past MI (-) dysrhythmias  Rhythm:Regular Rate:Normal - Systolic murmurs    Neuro/Psych PSYCHIATRIC DISORDERS Anxiety Depression negative neurological ROS     GI/Hepatic neg GERD  ,(+)     (-) substance abuse  ,   Endo/Other  diabetes  Renal/GU negative Renal ROS     Musculoskeletal   Abdominal   Peds  Hematology   Anesthesia Other Findings Past Medical History: No date: Arthritis No date: Blood clot in vein No date: Diabetes mellitus No date: DVT (deep venous thrombosis) (HCC) No date: Hypertension No date: Neuropathy 02/18/2020: Type 2 diabetes mellitus with diabetic neuropathy,  unspecified (HCC)  Reproductive/Obstetrics                            Anesthesia Physical Anesthesia Plan  ASA: 3  Anesthesia Plan: General   Post-op Pain Management: Minimal or no pain anticipated   Induction: Intravenous  PONV Risk Score and Plan: 1 and Propofol infusion, TIVA, Ondansetron and Midazolam  Airway Management Planned: Nasal Cannula  Additional Equipment: None  Intra-op Plan:   Post-operative Plan:   Informed Consent: I have reviewed the patients History and Physical, chart, labs and discussed the procedure including the risks, benefits and  alternatives for the proposed anesthesia with the patient or authorized representative who has indicated his/her understanding and acceptance.     Dental advisory given  Plan Discussed with: CRNA and Surgeon  Anesthesia Plan Comments: (Discussed risks of anesthesia with patient, including possibility of difficulty with spontaneous ventilation under anesthesia necessitating airway intervention, PONV, and rare risks such as cardiac or respiratory or neurological events, and allergic reactions. Discussed the role of CRNA in patient's perioperative care. Patient understands. Patient counseled on benefits of smoking cessation, and increased perioperative risks associated with continued smoking. )        Anesthesia Quick Evaluation

## 2021-07-01 NOTE — Progress Notes (Signed)
PROGRESS NOTE    [name]  QQV:[id number] DOB: [birth year] DOA: 06/30/2021 PCP: Arsenio Katz, MD   Brief Narrative: Taken from H&P. [name] is a 60 y.o. male with history of diabetes, history of DVT, hypertension, here with worsening pain of his right great toe.  The patient has had a wound over his right great toe for the last several weeks.  He was actually seen by podiatry on 12/14, and had been placed on antibiotics which he states did seem to improve his symptoms.  However, he states he did not follow-up as he should, and subsequently has developed grossly worsening aching, throbbing, severe, pain of his great toe. Patient is currently undergoing a lot of financial stresses and is homeless. MRI foot with concern of abscesses and osteomyelitis. Going for great toe amputation with podiatry today. Podiatry also ask vascular surgery to evaluate-most likely angiography after the amputation.  Subjective: Patient was seen and examined today.  Continues to have right foot pain.  Waiting for his procedure.  Assessment & Plan:   Principal Problem:   Cellulitis/ulcer of right great toe due to diabetes mellitus (HCC) Active Problems:   Uncontrolled type 2 diabetes mellitus with hyperglycemia, with long-term current use of insulin (HCC)   Essential hypertension   Tobacco use disorder   Dyslipidemia   DM (diabetes mellitus), type 2 with peripheral vascular complications (HCC)   History of depression with anxiety   Peripheral vascular disease (HCC)  Cellulitis/ulcer of right great toe due to diabetes mellitus/abscesses and osteomyelitis (HCC) MRI concerning for abscesses and osteomyelitis, going for right great toe amputation with podiatry later today.  Also concern of peripheral arterial disease, podiatry consulted vascular surgery and they might take him for angiography after the toe amputation. -Continue with cefepime and vancomycin for now. -Follow-up intraoperative  cultures  Peripheral vascular disease.  Patient was not taking his home Plavix regularly. -Vascular surgery to take him to lab for angiography later today.  Essential hypertension. -Continue home dose of metoprolol  Type 2 diabetes mellitus with peripheral vascular complications.  On metformin and basal at home.  Holding metformin -Continue basal and SSI -Continue Neurontin  Dyslipidemia. -Continue Lipitor  Objective: Vitals:   07/01/21 1328 07/01/21 1330 07/01/21 1345 07/01/21 1414  BP: 110/89 135/83 134/77 (!) 157/81  Pulse: 77 72 (!) 58 60  Resp: 18 13 10 18   Temp: (!) 97 F (36.1 C)   97.7 F (36.5 C)  TempSrc:      SpO2: 96% 97% 98% 100%  Weight:      Height:        Intake/Output Summary (Last 24 hours) at 07/01/2021 1624 Last data filed at 07/01/2021 1503 Gross per 24 hour  Intake --  Output 2045 ml  Net -2045 ml   Filed Weights   06/30/21 1128 07/01/21 1126  Weight: 91.3 kg 91.3 kg    Examination:  General exam: Appears calm and comfortable  Respiratory system: Clear to auscultation. Respiratory effort normal. Cardiovascular system: S1 & S2 heard, RRR.  Gastrointestinal system: Soft, nontender, nondistended, bowel sounds positive. Central nervous system: Alert and oriented. No focal neurological deficits. Extremities: Right big toe lateral ulcer with surrounding erythema and edema Psychiatry: Judgement and insight appear normal.     DVT prophylaxis: Heparin subcu Code Status: Full Family Communication:  Disposition Plan:  Status is: Inpatient  Remains inpatient appropriate because: Severity of illness  Level of care: Med-Surg  All the records are reviewed and case discussed with Care  Management/Social Worker. Management plans discussed with the patient, nursing and they are in agreement.  Consultants:  Podiatry Vascular surgery  Procedures:  Antimicrobials:  Cefepime Vancomycin  Data Reviewed: I have personally reviewed following labs  and imaging studies  CBC: Recent Labs  Lab 06/30/21 1258 06/30/21 1857 07/01/21 0722  WBC 9.9 8.1 6.8  HGB 17.7* 16.5 16.7  HCT 50.9 47.9 47.6  MCV 91.4 92.5 91.7  PLT 145* 128* 133*   Basic Metabolic Panel: Recent Labs  Lab 06/30/21 1258 06/30/21 1857 07/01/21 0722  NA 133*  --  134*  K 4.4  --  4.0  CL 97*  --  102  CO2 27  --  25  GLUCOSE 249*  --  171*  BUN 13  --  14  CREATININE 0.77 0.83 0.72  CALCIUM 9.5  --  8.8*   GFR: Estimated Creatinine Clearance: 112.1 mL/min (by C-G formula based on SCr of 0.72 mg/dL). Liver Function Tests: No results for input(s): AST, ALT, ALKPHOS, BILITOT, PROT, ALBUMIN in the last 168 hours. No results for input(s): LIPASE, AMYLASE in the last 168 hours. No results for input(s): AMMONIA in the last 168 hours. Coagulation Profile: No results for input(s): INR, PROTIME in the last 168 hours. Cardiac Enzymes: No results for input(s): CKTOTAL, CKMB, CKMBINDEX, TROPONINI in the last 168 hours. BNP (last 3 results) No results for input(s): PROBNP in the last 8760 hours. HbA1C: Recent Labs    06/30/21 1857  HGBA1C 10.0*   CBG: Recent Labs  Lab 06/30/21 2135 07/01/21 0834 07/01/21 1315  GLUCAP 319* 190* 152*   Lipid Profile: No results for input(s): CHOL, HDL, LDLCALC, TRIG, CHOLHDL, LDLDIRECT in the last 72 hours. Thyroid Function Tests: No results for input(s): TSH, T4TOTAL, FREET4, T3FREE, THYROIDAB in the last 72 hours. Anemia Panel: No results for input(s): VITAMINB12, FOLATE, FERRITIN, TIBC, IRON, RETICCTPCT in the last 72 hours. Sepsis Labs: Recent Labs  Lab 06/30/21 1258 06/30/21 1857  LATICACIDVEN 1.7 1.7    Recent Results (from the past 240 hour(s))  Culture, blood (routine x 2)     Status: None (Preliminary result)   Collection Time: 06/30/21 12:57 PM   Specimen: BLOOD  Result Value Ref Range Status   Specimen Description BLOOD RIGHT ANTECUBITAL  Final   Special Requests   Final    BOTTLES DRAWN AEROBIC  ONLY Blood Culture adequate volume   Culture   Final    NO GROWTH < 24 HOURS Performed at [facility], [address], [place], [place] [address]    Report Status PENDING  Incomplete  Culture, blood (routine x 2)     Status: None (Preliminary result)   Collection Time: 06/30/21  1:03 PM   Specimen: BLOOD  Result Value Ref Range Status   Specimen Description BLOOD BLOOD RIGHT HAND  Final   Special Requests   Final    BOTTLES DRAWN AEROBIC AND ANAEROBIC Blood Culture adequate volume   Culture   Final    NO GROWTH < 24 HOURS Performed at [facility], [address] [place], [place] [address]    Report Status PENDING  Incomplete     Radiology Studies: MR FOOT RIGHT WO CONTRAST  Result Date: 06/30/2021 CLINICAL DATA:  Diabetic foot ulcer. Pain and swelling of the right great toe. EXAM: MRI OF THE RIGHT FOREFOOT WITHOUT CONTRAST TECHNIQUE: Multiplanar, multisequence MR imaging of the right foot was performed. No intravenous contrast was administered. COMPARISON:  Radiographs 06/30/2021 FINDINGS: There is a focal  fluid collection noted in the subcutaneous tissues overlying the medial aspect of the proximal phalanx of the great toe. This measures a maximum of 2.7 x 1.4 cm and is consistent with an abscess. The distal aspect of the abscess is located along the interphalangeal joint. Could not exclude septic arthritis. Abnormal T1 and T2 marrow signal intensity in the proximal phalanx and to a lesser extent in the medial and proximal aspect of the distal phalanx consistent with osteomyelitis. Cellulitis mainly involving the great toe. There is also diffuse myofasciitis without definite findings for pyomyositis. The visualized midfoot bony structures are intact. Moderate degenerative changes. IMPRESSION: 1. 2.7 x 1.4 cm abscess in the subcutaneous tissues overlying the medial aspect of the proximal phalanx of the great toe. 2. Osteomyelitis involving the proximal and distal  phalanges of the great toe. Likely septic arthritis at the interphalangeal joint. 3. Cellulitis and myofasciitis without definite findings for pyomyositis. Electronically Signed   By: Rudie Meyer M.D.   On: 06/30/2021 18:29   DG Foot Complete Right  Result Date: 07/01/2021 CLINICAL DATA:  Postop amputation right foot EXAM: RIGHT FOOT COMPLETE - 3+ VIEW COMPARISON:  06/30/2021 FINDINGS: Interval amputation of the great toe at the level of the MTP joint. No fracture or osteomyelitis is identified in the first metatarsal. Otherwise negative. IMPRESSION: Amputation at the level the first metatarsal phalangeal joint. No acute skeletal abnormality. Electronically Signed   By: Marlan Palau M.D.   On: 07/01/2021 14:36   DG Foot Complete Right  Result Date: 06/30/2021 CLINICAL DATA:  Right big toe infection. Pain and swelling in the right big toe. History of diabetes. EXAM: RIGHT FOOT COMPLETE - 3+ VIEW COMPARISON:  Radiographs 05/26/2021 and 11/09/2020 FINDINGS: The mineralization and alignment are normal. There is no evidence of acute fracture or dislocation. There is no bone destruction to suggest osteomyelitis. Stable mild degenerative changes at the 1st interphalangeal and metatarsal-phalangeal joints. Stable chronic ossific density between the bases of the 1st and 2nd metatarsals and small plantar calcaneal spur. Mild dorsal forefoot soft tissue swelling extending into the great toe. No evidence of foreign body or soft tissue emphysema. IMPRESSION: Stable forefoot soft tissue swelling and degenerative changes. No acute findings or radiographic evidence of osteomyelitis. Electronically Signed   By: Carey Bullocks M.D.   On: 06/30/2021 13:51    Scheduled Meds:  heparin  5,000 Units Subcutaneous Q8H   insulin aspart  0-15 Units Subcutaneous TID WC   insulin aspart  0-5 Units Subcutaneous QHS   insulin aspart  4 Units Subcutaneous TID WC   insulin glargine-yfgn  24 Units Subcutaneous QHS   metoprolol  tartrate  50 mg Oral BID   nicotine  21 mg Transdermal Daily   Continuous Infusions:  ceFEPime (MAXIPIME) IV 2 g (07/01/21 1601)   vancomycin 1,250 mg (07/01/21 1059)     LOS: 1 day   Time spent: 40 minutes. More than 50% of the time was spent in counseling/coordination of care  Arnetha Courser, MD [facility]  If 7PM-7AM, please contact night-coverage [email or web address]  07/01/2021, 4:24 PM   This record has been created using Conservation officer, historic buildings. Errors have been sought and corrected,but may not always be located. Such creation errors do not reflect on the standard of care.

## 2021-07-01 NOTE — Op Note (Signed)
Surgeon: Surgeon(s): [name], DPM  Assistants: None Pre-operative diagnosis: Osteomylitis Right Hallux  Post-operative diagnosis: same Procedure: Procedure(s) (LRB): AMPUTATION DIGIT (Right)  Pathology:  ID Type Source Tests Collected by Time Destination  1 : Right Great Toe Tissue PATH Digit amputation SURGICAL PATHOLOGY, AEROBIC/ANAEROBIC CULTURE W GRAM STAIN (SURGICAL/DEEP WOUND) [name], DPM 07/01/2021 1235     Pertinent Intra-op findings: Osteomyelitic changes noted to the proximal phalanx of the great toe.  Minimal bleeding noted Anesthesia: Choice  Hemostasis: * Missing tourniquet times found for documented tourniquets in log: [id number] * EBL: 20 mL  Materials: 3-0 Prolene Injectables: 10 cc of half percent Marcaine plain 1% lidocaine plain 10 cc Complications: None  Indications for surgery: A 60 y.o. male presents with right hallux osteomyelitis with abscess. Patient has failed all conservative therapy including but not limited to local wound care and IV antibiotics. He wishes to have surgical correction of the foot/deformity. It was determined that patient would benefit from right hallux amputation at metatarsophalangeal joint disarticulation. Informed surgical risk consent was reviewed and read aloud to the patient.  I reviewed the films.  I have discussed my findings with the patient in great detail.  I have discussed all risks including but not limited to infection, stiffness, scarring, limp, disability, deformity, damage to blood vessels and nerves, numbness, poor healing, need for braces, arthritis, chronic pain, amputation, death.  All benefits and realistic expectations discussed in great detail.  I have made no promises as to the outcome.  I have provided realistic expectations.  I have offered the patient a 2nd opinion, which they have declined and assured me they preferred to proceed despite the risks   Procedure in detail: The patient was both verbally and  visually identified by myself, the nursing staff, and anesthesia staff in the preoperative holding area. They were then transferred to the operating room and placed on the operative table in supine position.  Attention was directed to the right great toe fishmouth style incision was delineated using skin marker.  #10 blade was used to take the incision epidermal dermal down to the level of the bone.  Metatarsophalangeal joint was identified.  The digit was disarticulated at the MPJ joint.  Osteomyelitic changes noted to the head of the proximal phalanx with soft friable bone.  No purulent drainage was noted.  The wound was thoroughly irrigated with normal saline solution.  At this time no purulent drainage or any other clinical signs of infection noted.  It was determined that wound can be primarily closed.  Using 3-0 Prolene the wound was primarily closed with simple interrupted suture technique.  The wound was dressed with Xeroform Curlex Ace bandage.  All bony prominences were adequately padded.  Disposition Patient is okay to be discharged from podiatric standpoint.  He can be weightbearing as tolerated in surgical shoe.  He can be discharged on 10 days of doxycycline.  He will still benefit from vascular consult and evaluation.  He had minimal bleeding in the operating room.  No dressing change until follow-up.  He will follow-up 1 week from discharge.  At the conclusion of the procedure the patient was awoken from anesthesia and found to have tolerated the procedure well any complications. There were transferred to PACU with vital signs stable and vascular status intact.  Nicholes Rough, DPM

## 2021-07-01 NOTE — Consult Note (Signed)
°  Subjective:  Patient ID: [name], male    DOB: 09/02/1960,  MRN: [id number]  A 60 y.o. male  history of diabetes, history of DVT, hypertension, here with worsening pain of his right great toe infection/osteomyelitis.  Patient states is doing well.  He states that he was seen and known to Dr. Abbott Pao was last seen by him on 1214.  It was doing better however got worse.  He came to the emergency room and has been dealing with this for quite some time.  He currently is homeless.  He has a severe vascular history where he had a bypass done on the right leg.  He had this done at [facility].  He denies any other acute complaints.  Objective:   Vitals:   07/01/21 1015 07/01/21 1126  BP:  140/81  Pulse: 69 72  Resp:  14  Temp:  97.6 F (36.4 C)  SpO2:  96%   General AA&O x3. Normal mood and affect.  Vascular Dorsalis pedis and posterior tibial pulses non palpable  Brisk capillary refill diminished.  No pedal hair present.  Neurologic Epicritic sensation grossly intact.  Dermatologic Right great toe ulceration with probing down to bone.  Redness noted.  Purulent drainage noted.  Orthopedic: MMT 5/5 in dorsiflexion, plantarflexion, inversion, and eversion. Normal joint ROM without pain or crepitus.       Assessment & Plan:  Patient was evaluated and treated and all questions answered.  Right great toe abscess/osteomyelitis with history of diabetes and peripheral vascular disease -All questions and concerns were discussed with the patient extensive detail -MRI finding shows abscess and osteomyelitis of the great big toe.  At this time patient will benefit from surgical amputation of the great big toe.  I discussed with the patient in extensive detail he states understanding would like to proceed with surgical amputation. -He has severe vascular history with most recent being at [facility] for right lower extremity bypass.  Given his history want to make sure that his bypasses  still patent and functioning well.  I believe he will benefit from a vascular consultation for further management. -He is a high risk of developing worsening of the infection/for the loss of the foot versus the leg.  I discussed this with the patient he states understanding. -Weightbearing as tolerated with a surgical shoe -We will plan on doing to take him to the operating room today given the findings of MRI for osteomyelitis and abscess formation. -N.p.o. after midnight confirmed   [name], DPM  Accessible via secure chat for questions or concerns.

## 2021-07-01 NOTE — Anesthesia Procedure Notes (Signed)
Procedure Name: MAC Date/Time: 07/01/2021 12:10 PM Performed by: Jerrye Noble, CRNA Pre-anesthesia Checklist: Patient identified, Emergency Drugs available, Suction available and Patient being monitored Patient Re-evaluated:Patient Re-evaluated prior to induction Oxygen Delivery Method: Nasal cannula

## 2021-07-01 NOTE — Progress Notes (Signed)
Pt refusing care and requesting to leave AMA. Paperwork signed and in chart. Pt states he has ride organized and will have said ride stay with him for the next 24 hr. Educated on importance of receiving medical care but pt adamant on leaving. MD notified,

## 2021-07-02 ENCOUNTER — Telehealth: Payer: Self-pay | Admitting: Podiatry

## 2021-07-02 ENCOUNTER — Ambulatory Visit: Admit: 2021-07-02 | Payer: Medicaid Other | Admitting: Vascular Surgery

## 2021-07-02 ENCOUNTER — Encounter: Payer: Self-pay | Admitting: Anesthesiology

## 2021-07-02 ENCOUNTER — Encounter (INDEPENDENT_AMBULATORY_CARE_PROVIDER_SITE_OTHER): Payer: Self-pay | Admitting: *Deleted

## 2021-07-02 ENCOUNTER — Inpatient Hospital Stay: Admit: 2021-07-02 | Payer: Medicaid Other

## 2021-07-02 SURGERY — LOWER EXTREMITY INTERVENTION
Anesthesia: General | Laterality: Right

## 2021-07-02 NOTE — Telephone Encounter (Signed)
error 

## 2021-07-02 NOTE — Anesthesia Preprocedure Evaluation (Deleted)
Anesthesia Evaluation  °Patient identified by MRN, date of birth, ID band °Patient awake ° ° ° °Reviewed: °Allergy & Precautions, NPO status , Patient's Chart, lab work & pertinent test results ° °History of Anesthesia Complications °Negative for: history of anesthetic complications ° °Airway °Mallampati: II ° °TM Distance: >3 FB °Neck ROM: Full ° ° ° Dental °no notable dental hx. °(+) Teeth Intact °  °Pulmonary °neg sleep apnea, neg COPD, Current Smoker and Patient abstained from smoking.,  °  °Pulmonary exam normal °breath sounds clear to auscultation ° ° ° ° ° ° Cardiovascular °Exercise Tolerance: Good °METShypertension, + Peripheral Vascular Disease  °(-) CAD and (-) Past MI (-) dysrhythmias  °Rhythm:Regular Rate:Normal °- Systolic murmurs ° °  °Neuro/Psych °PSYCHIATRIC DISORDERS Anxiety Depression negative neurological ROS °   ° GI/Hepatic °neg GERD  ,(+)  °  ° (-) substance abuse ° ,   °Endo/Other  °diabetes ° Renal/GU °negative Renal ROS  ° °  °Musculoskeletal ° ° Abdominal °  °Peds ° Hematology °  °Anesthesia Other Findings °Past Medical History: °No date: Arthritis °No date: Blood clot in vein °No date: Diabetes mellitus °No date: DVT (deep venous thrombosis) (HCC) °No date: Hypertension °No date: Neuropathy °02/18/2020: Type 2 diabetes mellitus with diabetic neuropathy,  °unspecified (HCC) ° Reproductive/Obstetrics ° °  ° ° ° ° ° ° ° ° ° ° ° ° ° °  °  ° ° ° ° ° ° ° °Anesthesia Physical °Anesthesia Plan ° °ASA: 3 ° °Anesthesia Plan: General  ° °Post-op Pain Management: Minimal or no pain anticipated  ° °Induction: Intravenous ° °PONV Risk Score and Plan: 1 and Propofol infusion, TIVA, Ondansetron and Midazolam ° °Airway Management Planned: Nasal Cannula ° °Additional Equipment: None ° °Intra-op Plan:  ° °Post-operative Plan:  ° °Informed Consent: I have reviewed the patients History and Physical, chart, labs and discussed the procedure including the risks, benefits and  alternatives for the proposed anesthesia with the patient or authorized representative who has indicated his/her understanding and acceptance.  ° ° ° °Dental advisory given ° °Plan Discussed with: CRNA and Surgeon ° °Anesthesia Plan Comments: (Discussed risks of anesthesia with patient, including possibility of difficulty with spontaneous ventilation under anesthesia necessitating airway intervention, PONV, and rare risks such as cardiac or respiratory or neurological events, and allergic reactions. Discussed the role of CRNA in patient's perioperative care. Patient understands. °Patient counseled on benefits of smoking cessation, and increased perioperative risks associated with continued smoking. °)  ° ° ° ° ° ° °Anesthesia Quick Evaluation ° °

## 2021-07-04 LAB — AEROBIC/ANAEROBIC CULTURE W GRAM STAIN (SURGICAL/DEEP WOUND)

## 2021-07-06 LAB — CULTURE, BLOOD (ROUTINE X 2)
Culture: NO GROWTH
Culture: NO GROWTH
Special Requests: ADEQUATE
Special Requests: ADEQUATE

## 2021-07-06 NOTE — Discharge Summary (Signed)
Patient left AMA during after hours. Please see progress note for the day, on 07/01/2021 for more details.

## 2021-08-14 ENCOUNTER — Other Ambulatory Visit: Payer: Self-pay

## 2021-08-14 ENCOUNTER — Encounter: Payer: Self-pay | Admitting: Intensive Care

## 2021-08-14 ENCOUNTER — Emergency Department
Admission: EM | Admit: 2021-08-14 | Discharge: 2021-08-14 | Disposition: A | Discharge status: Home / Self Care | Payer: Medicaid Other | Attending: Emergency Medicine | Admitting: Emergency Medicine

## 2021-08-14 ENCOUNTER — Emergency Department: Payer: Medicaid Other

## 2021-08-14 DIAGNOSIS — L089 Local infection of the skin and subcutaneous tissue, unspecified: Secondary | ICD-10-CM | POA: Diagnosis not present

## 2021-08-14 DIAGNOSIS — G8918 Other acute postprocedural pain: Secondary | ICD-10-CM | POA: Diagnosis not present

## 2021-08-14 DIAGNOSIS — E1165 Type 2 diabetes mellitus with hyperglycemia: Secondary | ICD-10-CM | POA: Insufficient documentation

## 2021-08-14 LAB — COMPREHENSIVE METABOLIC PANEL
ALT: 17 U/L (ref 0–44)
AST: 14 U/L — ABNORMAL LOW (ref 15–41)
Albumin: 3.8 g/dL (ref 3.5–5.0)
Alkaline Phosphatase: 61 U/L (ref 38–126)
Anion gap: 5 (ref 5–15)
BUN: 17 mg/dL (ref 8–23)
CO2: 28 mmol/L (ref 22–32)
Calcium: 9.3 mg/dL (ref 8.9–10.3)
Chloride: 102 mmol/L (ref 98–111)
Creatinine, Ser: 0.76 mg/dL (ref 0.61–1.24)
GFR, Estimated: >60 mL/min (ref >60)
Glucose, Bld: 283 mg/dL — ABNORMAL HIGH (ref 70–99)
Potassium: 4.2 mmol/L (ref 3.5–5.1)
Sodium: 135 mmol/L (ref 135–145)
Total Bilirubin: 0.6 mg/dL (ref 0.3–1.2)
Total Protein: 6.5 g/dL (ref 6.5–8.1)

## 2021-08-14 LAB — CBC WITH DIFFERENTIAL/PLATELET
Abs Immature Granulocytes: 0.01 10*3/uL (ref 0.00–0.07)
Basophils Absolute: 0.1 10*3/uL (ref 0.0–0.1)
Basophils Relative: 1 %
Eosinophils Absolute: 0.4 10*3/uL (ref 0.0–0.5)
Eosinophils Relative: 6 %
HCT: 45.6 % (ref 39.0–52.0)
Hemoglobin: 15.8 g/dL (ref 13.0–17.0)
Immature Granulocytes: 0 %
Lymphocytes Relative: 24 %
Lymphs Abs: 1.5 10*3/uL (ref 0.7–4.0)
MCH: 33.1 pg (ref 26.0–34.0)
MCHC: 34.6 g/dL (ref 30.0–36.0)
MCV: 95.6 fL (ref 80.0–100.0)
Monocytes Absolute: 0.6 10*3/uL (ref 0.1–1.0)
Monocytes Relative: 9 %
Neutro Abs: 3.6 10*3/uL (ref 1.7–7.7)
Neutrophils Relative %: 60 %
Platelets: 132 10*3/uL — ABNORMAL LOW (ref 150–400)
RBC: 4.77 MIL/uL (ref 4.22–5.81)
RDW: 12.1 % (ref 11.5–15.5)
WBC: 6.1 10*3/uL (ref 4.0–10.5)
nRBC: 0 % (ref 0.0–0.2)

## 2021-08-14 LAB — LACTIC ACID, PLASMA: Lactic Acid, Venous: 1.3 mmol/L (ref 0.5–1.9)

## 2021-08-14 MED ORDER — OXYCODONE-ACETAMINOPHEN 5-325 MG PO TABS
2.0000 | ORAL_TABLET | Freq: Once | ORAL | Status: AC
  Administered 2021-08-14: 2 via ORAL
  Filled 2021-08-14: qty 2

## 2021-08-14 MED ORDER — HYDROCODONE-ACETAMINOPHEN 5-325 MG PO TABS: 1.0000 | ORAL_TABLET | ORAL | 0 refills | Status: DC | PRN

## 2021-08-14 MED ORDER — CEPHALEXIN 500 MG PO CAPS
500.0000 mg | ORAL_CAPSULE | Freq: Once | ORAL | Status: AC
  Administered 2021-08-14: 500 mg via ORAL
  Filled 2021-08-14: qty 1

## 2021-08-14 MED ORDER — CEPHALEXIN 500 MG PO CAPS: 500.0000 mg | ORAL_CAPSULE | Freq: Two times a day (BID) | ORAL | 0 refills | Status: DC

## 2021-08-14 NOTE — ED Notes (Signed)
Pt had surgery last month to get his right big toe amputated last month and about 4 days ago the stiches started to get inflamed and puffy around the site.   RN assessed the foot and appears puffy and red around the incision site. Pt says he can feel sensation in the foot and does have a hx of diabetes.   Pt pain level is a 10 on a 0-10 pain scale.

## 2021-08-14 NOTE — Discharge Instructions (Addendum)
Please take antibiotics as prescribed for their entire course.  Please take your pain medication as written but only as prescribed.  Please follow-up with your doctor/podiatrist for recheck/reevaluation.  Return to the emergency department for any symptom personally concerning to yourself.

## 2021-08-14 NOTE — ED Notes (Signed)
Pt verbalized understanding of discharge instructions, pain management, prescription medications, and follow-up care instructions. Pt advised to return to ED if symptoms worsen or come back.

## 2021-08-14 NOTE — ED Triage Notes (Signed)
Patient had right, great toe amputated recently and had next appointment scheduled for February 22nd of this month but came to be checked for swelling and pain to surgical area. Denies drainage.

## 2021-08-14 NOTE — ED Provider Notes (Signed)
[facility] Provider Note    Event Date/Time   First MD Initiated Contact with Patient 08/14/21 1757     (approximate)  History   Chief Complaint: Wound Infection  HPI  [name] is a 61 y.o. male with a past medical history of diabetes, recent right great toe amputation, presents to the emergency department for great toe evaluation.  According to the patient he recently had a right great toe amputation performed at [facility].  States his pain has been worsening and his follow-up appointment is not for another 10 days so he came to the emergency department for evaluation.  Patient denies any fever.  Denies any pus/drainage.  Physical Exam   Triage Vital Signs: ED Triage Vitals  Enc Vitals Group     BP 08/14/21 1738 (!) 160/82     Pulse Rate 08/14/21 1738 84     Resp 08/14/21 1738 18     Temp 08/14/21 1738 98.7 F (37.1 C)     Temp Source 08/14/21 1738 Oral     SpO2 08/14/21 1738 98 %     Weight 08/14/21 1739 205 lb (93 kg)     Height 08/14/21 1739 5\' 11"  (1.803 m)     Head Circumference --      Peak Flow --      Pain Score 08/14/21 1739 10     Pain Loc --      Pain Edu? --      Excl. in [place]? --     Most recent vital signs: Vitals:   08/14/21 1738  BP: (!) 160/82  Pulse: 84  Resp: 18  Temp: 98.7 F (37.1 C)  SpO2: 98%    General: Awake, no distress.  CV:  Good peripheral perfusion.  Regular rate and rhythm  Resp:  Normal effort.  Equal breath sounds bilaterally.  Abd:  No distention.  Soft, nontender.  No rebound or guarding. Other:  Patient has what appears to be a recent right great toe amputation.  Well-appearing surgical incision with sutures in place.  No dehiscence noted.  No drainage.  No obvious signs of infection.   ED Results / Procedures / Treatments   RADIOLOGY  I have personally evaluated the x-ray images, no significant finding on my evaluation. Radiology does not see any acute abnormality either.   MEDICATIONS ORDERED  IN ED: Medications  oxyCODONE-acetaminophen (PERCOCET/ROXICET) 5-325 MG per tablet 2 tablet (has no administration in time range)  cephALEXin (KEFLEX) capsule 500 mg (has no administration in time range)     IMPRESSION / MDM / ASSESSMENT AND PLAN / ED COURSE  I reviewed the triage vital signs and the nursing notes.  Patient presents to the emergency department for evaluation of the right great toe recent amputation site.  The site appears well to myself.  Surgical incision appears well, no dehiscence, no drainage no sign of abscess or obvious sign of cellulitis.  We will check labs including CBC CMP and a lactic.  We will obtain an x-ray to evaluate for possible osteomyelitis.  We will continue to closely monitor.  We will dose Percocet for pain control in the emergency department.  Patient agreeable.  Patient's lab work is reassuring including a normal white blood cell count, reassuring CBC, normal lactic acid.  Mild hyperglycemia otherwise reassuring chemistry.  Given the patient's reassuring work-up I believe the patient is safe for discharge home on a short course of pain medication.  Given the patient's increased pain we will  cover with a short course of antibiotics and the patient is to follow-up with his podiatrist.  Patient agreeable to plan of care.  FINAL CLINICAL IMPRESSION(S) / ED DIAGNOSES   Postoperative pain  Rx / DC Orders   [facility]ef[facility] Podiatry follow-up  Note:  This document was prepared using Dragon voice recognition software and may include unintentional dictation errors.   Harvest Dark, MD 08/14/21 2019

## 2021-08-27 ENCOUNTER — Emergency Department
Admission: EM | Admit: 2021-08-27 | Discharge: 2021-08-27 | Disposition: A | Discharge status: Home / Self Care | Payer: Medicaid Other | Attending: Emergency Medicine | Admitting: Emergency Medicine

## 2021-08-27 ENCOUNTER — Other Ambulatory Visit: Payer: Self-pay

## 2021-08-27 ENCOUNTER — Encounter: Payer: Self-pay | Admitting: Intensive Care

## 2021-08-27 ENCOUNTER — Emergency Department: Payer: Medicaid Other

## 2021-08-27 DIAGNOSIS — M79671 Pain in right foot: Secondary | ICD-10-CM | POA: Diagnosis present

## 2021-08-27 DIAGNOSIS — E119 Type 2 diabetes mellitus without complications: Secondary | ICD-10-CM | POA: Insufficient documentation

## 2021-08-27 DIAGNOSIS — I739 Peripheral vascular disease, unspecified: Secondary | ICD-10-CM | POA: Insufficient documentation

## 2021-08-27 DIAGNOSIS — I10 Essential (primary) hypertension: Secondary | ICD-10-CM | POA: Insufficient documentation

## 2021-08-27 DIAGNOSIS — L03115 Cellulitis of right lower limb: Secondary | ICD-10-CM | POA: Insufficient documentation

## 2021-08-27 DIAGNOSIS — E785 Hyperlipidemia, unspecified: Secondary | ICD-10-CM

## 2021-08-27 LAB — CBC WITH DIFFERENTIAL/PLATELET
Abs Immature Granulocytes: 0.02 10*3/uL (ref 0.00–0.07)
Basophils Absolute: 0.1 10*3/uL (ref 0.0–0.1)
Basophils Relative: 1 %
Eosinophils Absolute: 0.4 10*3/uL (ref 0.0–0.5)
Eosinophils Relative: 6 %
HCT: 50 % (ref 39.0–52.0)
Hemoglobin: 17.5 g/dL — ABNORMAL HIGH (ref 13.0–17.0)
Immature Granulocytes: 0 %
Lymphocytes Relative: 21 %
Lymphs Abs: 1.6 10*3/uL (ref 0.7–4.0)
MCH: 32.5 pg (ref 26.0–34.0)
MCHC: 35 g/dL (ref 30.0–36.0)
MCV: 92.8 fL (ref 80.0–100.0)
Monocytes Absolute: 0.7 10*3/uL (ref 0.1–1.0)
Monocytes Relative: 9 %
Neutro Abs: 4.9 10*3/uL (ref 1.7–7.7)
Neutrophils Relative %: 63 %
Platelets: 171 10*3/uL (ref 150–400)
RBC: 5.39 MIL/uL (ref 4.22–5.81)
RDW: 12.1 % (ref 11.5–15.5)
WBC: 7.8 10*3/uL (ref 4.0–10.5)
nRBC: 0 % (ref 0.0–0.2)

## 2021-08-27 LAB — COMPREHENSIVE METABOLIC PANEL
ALT: 15 U/L (ref 0–44)
AST: 12 U/L — ABNORMAL LOW (ref 15–41)
Albumin: 4.2 g/dL (ref 3.5–5.0)
Alkaline Phosphatase: 67 U/L (ref 38–126)
Anion gap: 9 (ref 5–15)
BUN: 12 mg/dL (ref 8–23)
CO2: 27 mmol/L (ref 22–32)
Calcium: 9.2 mg/dL (ref 8.9–10.3)
Chloride: 97 mmol/L — ABNORMAL LOW (ref 98–111)
Creatinine, Ser: 0.76 mg/dL (ref 0.61–1.24)
GFR, Estimated: >60 mL/min (ref >60)
Glucose, Bld: 183 mg/dL — ABNORMAL HIGH (ref 70–99)
Potassium: 4.3 mmol/L (ref 3.5–5.1)
Sodium: 133 mmol/L — ABNORMAL LOW (ref 135–145)
Total Bilirubin: 0.8 mg/dL (ref 0.3–1.2)
Total Protein: 7.5 g/dL (ref 6.5–8.1)

## 2021-08-27 LAB — CK: Total CK: 69 U/L (ref 49–397)

## 2021-08-27 LAB — URINALYSIS, ROUTINE W REFLEX MICROSCOPIC
Bilirubin Urine: NEGATIVE
Glucose, UA: NEGATIVE mg/dL
Hgb urine dipstick: NEGATIVE
Ketones, ur: NEGATIVE mg/dL
Leukocytes,Ua: NEGATIVE
Nitrite: NEGATIVE
Protein, ur: NEGATIVE mg/dL
Specific Gravity, Urine: 1.004 — ABNORMAL LOW (ref 1.005–1.030)
pH: 6 (ref 5.0–8.0)

## 2021-08-27 LAB — LACTIC ACID, PLASMA
Lactic Acid, Venous: 1.3 mmol/L (ref 0.5–1.9)
Lactic Acid, Venous: 1.5 mmol/L (ref 0.5–1.9)

## 2021-08-27 LAB — SEDIMENTATION RATE: Sed Rate: 2 mm/hr (ref 0–20)

## 2021-08-27 MED ORDER — DOXYCYCLINE HYCLATE 100 MG PO CAPS
100.0000 mg | ORAL_CAPSULE | Freq: Two times a day (BID) | ORAL | 0 refills | Status: AC
  Filled 2021-08-27: qty 20, 10d supply, fill #0

## 2021-08-27 MED ORDER — ASPIRIN 81 MG PO TBEC
81.0000 mg | DELAYED_RELEASE_TABLET | Freq: Every day | ORAL | 2 refills | Status: AC
  Filled 2021-08-27: qty 30, 30d supply, fill #0

## 2021-08-27 MED ORDER — ASPIRIN 81 MG PO CHEW
324.0000 mg | CHEWABLE_TABLET | Freq: Once | ORAL | Status: AC
  Administered 2021-08-27: 324 mg via ORAL
  Filled 2021-08-27: qty 4

## 2021-08-27 MED ORDER — IOHEXOL 350 MG/ML SOLN
125.0000 mL | Freq: Once | INTRAVENOUS | Status: AC | PRN
  Administered 2021-08-27: 125 mL via INTRAVENOUS
  Filled 2021-08-27: qty 125

## 2021-08-27 MED ORDER — ATORVASTATIN CALCIUM 40 MG PO TABS: 40.0000 mg | ORAL_TABLET | Freq: Every day | ORAL | 3 refills | Status: DC

## 2021-08-27 MED ORDER — OXYCODONE-ACETAMINOPHEN 5-325 MG PO TABS
1.0000 | ORAL_TABLET | Freq: Once | ORAL | Status: AC
  Administered 2021-08-27: 1 via ORAL
  Filled 2021-08-27: qty 1

## 2021-08-27 MED ORDER — OXYCODONE-ACETAMINOPHEN 5-325 MG PO TABS: 1.0000 | ORAL_TABLET | Freq: Three times a day (TID) | ORAL | 0 refills | Status: AC | PRN

## 2021-08-27 NOTE — ED Provider Notes (Signed)
[facility] Provider Note    Event Date/Time   First MD Initiated Contact with Patient 08/27/21 1452     (approximate)   History   Post-op Problem   HPI  [name] is a 61 y.o. male with history of DM, RLE osteomyelitis s/p right great toe amp 07/01/21, remote LLE DVT, chronic venous insufficiency s/p LGSV stripping 2010, HTN and  PAD s/p RLE fem-peroneal bypass who presents for evaluation of some worsening pain in his right foot over the last 5 or 6 days.  He denies any interim injuries or falls.  No fevers, cough, vomiting, diarrhea, rash or other acute associated pain.  States he had some financial issues and has not been taking his aspirin.  He also states he has not been taking his Plavix because he felt it made him sick.  He did not follow-up with podiatry after his surgery.  States he is taking his metformin and Lantus.  No other acute sick symptoms.      Physical Exam  Triage Vital Signs: ED Triage Vitals  Enc Vitals Group     BP 08/27/21 1302 (!) 122/96     Pulse Rate 08/27/21 1302 99     Resp 08/27/21 1302 16     Temp 08/27/21 1302 98.6 F (37 C)     Temp Source 08/27/21 1302 Oral     SpO2 08/27/21 1302 96 %     Weight 08/27/21 1303 200 lb (90.7 kg)     Height 08/27/21 1303 '5\' 11"'  (1.803 m)     Head Circumference --      Peak Flow --      Pain Score 08/27/21 1303 10     Pain Loc --      Pain Edu? --      Excl. in [place]? --     Most recent vital signs: Vitals:   08/27/21 1302 08/27/21 1532  BP: (!) 122/96 115/75  Pulse: 99 97  Resp: 16 17  Temp: 98.6 F (37 C) 98.1 F (36.7 C)  SpO2: 96% 98%    General: Awake, appears uncomfortable. CV:  Plus radial pulses with no significant murmur. Resp:  Normal effort.  Abd:  No distention.  Other:  Patient has an ulcer at the amputation site on the right foot.  The second through fourth toes are fairly erythematous edematous and tender as well as edema and tenderness extending on the  dorsum of the foot.  No other wounds.   ED Results / Procedures / Treatments  Labs (all labs ordered are listed, but only abnormal results are displayed) Labs Reviewed  COMPREHENSIVE METABOLIC PANEL - Abnormal; Notable for the following components:      Result Value   Sodium 133 (*)    Chloride 97 (*)    Glucose, Bld 183 (*)    AST 12 (*)    All other components within normal limits  CBC WITH DIFFERENTIAL/PLATELET - Abnormal; Notable for the following components:   Hemoglobin 17.5 (*)    All other components within normal limits  URINALYSIS, ROUTINE W REFLEX MICROSCOPIC - Abnormal; Notable for the following components:   Color, Urine STRAW (*)    APPearance CLEAR (*)    Specific Gravity, Urine 1.004 (*)    All other components within normal limits  CULTURE, BLOOD (ROUTINE X 2)  CULTURE, BLOOD (ROUTINE X 2)  LACTIC ACID, PLASMA  LACTIC ACID, PLASMA  SEDIMENTATION RATE  CK     EKG  RADIOLOGY On my interpretation of MRI of patient's right foot I do not see acute osteomyelitis or an abscess.  Also reviewed radiologist findings and agree with their interpretation of no evidence of acute osteomyelitis though there is notation of some diffuse edema throughout the musculature and dorsum of the foot.  CTA of the lower extremities with runoff interpreted by radiology shows fairly extensive occlusions including in the right common femoral artery, occlusion of the stent in the right femoral artery, right popliteal artery with some distal reconstitution possible occlusion of the dorsalis pedis artery.  There is also an occlusion of the left popliteal artery with reconstitution distally as well as atherosclerotic plaques in the abdominal aorta and evidence of fatty liver disease.  PROCEDURES:  Critical Care performed: No  Procedures    MEDICATIONS ORDERED IN ED: Medications  oxyCODONE-acetaminophen (PERCOCET/ROXICET) 5-325 MG per tablet 1 tablet (has no administration in time  range)  aspirin chewable tablet 324 mg (has no administration in time range)  oxyCODONE-acetaminophen (PERCOCET/ROXICET) 5-325 MG per tablet 1 tablet (1 tablet Oral Given 08/27/21 1531)  iohexol (OMNIPAQUE) 350 MG/ML injection 125 mL (125 mLs Intravenous Contrast Given 08/27/21 1646)     IMPRESSION / MDM / ASSESSMENT AND PLAN / ED COURSE  I reviewed the triage vital signs and the nursing notes.                              Differential diagnosis includes, but is not limited to osteomyelitis, worsening of claudication, cellulitis, fracture, subacute arterial occlusion.  CMP shows a glucose of 183 without any other significant lecture light or metabolic derangements.  Lactic acid is not elevated.  CBC without leukocytosis or acute anemia.  ESR is 2.  On my interpretation of MRI of patient's right foot I do not see acute osteomyelitis or an abscess.  Also reviewed radiologist findings and agree with their interpretation of no evidence of acute osteomyelitis though there is notation of some diffuse edema throughout the musculature and dorsum of the foot.  CTA of the lower extremities with runoff interpreted by radiology shows fairly extensive occlusions including in the right common femoral artery, occlusion of the stent in the right femoral artery, right popliteal artery with some distal reconstitution possible occlusion of the dorsalis pedis artery.  There is also an occlusion of the left popliteal artery with reconstitution distally as well as atherosclerotic plaques in the abdominal aorta and evidence of fatty liver disease.  Overall have a low suspicion for sepsis or osteomyelitis other findings are concerning for possible early developing cellulitis.  Will cover with doxycycline.  Discussed concern for subacute ischemia from multiple occlusions as noted in CT.  After reviewing with on-call vascular surgeon Dr. Lucky Cowboy he feels these are multiple chronic and that there is no indication for an acute  intervention but the patient should restart his Plavix and aspirin and follow-up with vascular at [facility]  I discussed this with patient and he is amenable to plan.  Also advised close follow-up with podiatry as he has not had podiatry visit since his surgery.  Department short course of Percocet for analgesia.  Discharged in stable condition.  Strict precautions advised and discussed.  Rx for ASA and atorvastatin refilled.     FINAL CLINICAL IMPRESSION(S) / ED DIAGNOSES   Final diagnoses:  Cellulitis of right lower extremity  PAD (peripheral artery disease) ([place])     Rx / DC Orders   ED Discharge  Orders          Ordered    doxycycline (VIBRAMYCIN) 100 MG capsule  2 times daily        08/27/21 1806    aspirin 81 MG EC tablet  Daily        08/27/21 1806    oxyCODONE-acetaminophen (PERCOCET) 5-325 MG tablet  Every 8 hours PRN        08/27/21 1809    atorvastatin (LIPITOR) 40 MG tablet  Daily        08/27/21 1810             Note:  This document was prepared using Dragon voice recognition software and may include unintentional dictation errors.   Lucrezia Starch, MD 08/27/21 [phone]

## 2021-08-27 NOTE — ED Triage Notes (Signed)
Patient had right foot, great toe amputated around 2 months ago. Missed appointment 08/25/21 and reports shooting pain, drainage, foul smell, and swelling. Seen here at [facility] 08/14/21 for same.

## 2021-08-27 NOTE — Discharge Instructions (Addendum)
Your CT today showed: IMPRESSION: There is occlusion of right common femoral artery. There is occlusion of stent in the right femoral artery. There is occlusion of right popliteal artery. There is reconstitution of anterior tibial, posterior tibial and peroneal arteries close to the ankle. There is possible occlusion of dorsalis pedis artery in the ankle. There is previous surgical amputation of right big toe.   There is occlusion of left popliteal artery. There is reconstitution of distal course of left popliteal artery by collateral circulation. There is occlusion and multiple foci of high-grade stenosis in the anterior tibial, posterior tibial and peroneal arteries. There is reconstitution of dorsalis pedis, posterior tibial and peroneal arteries at the level of the ankle.   There are atherosclerotic plaques in the abdominal aorta without significant stenosis or aneurysmal dilation. Major intra-abdominal branches of abdominal aorta appear to be patent.   Enlarged fatty liver. Enlarged spleen. There is no evidence of intestinal obstruction or pneumoperitoneum. There is no hydronephrosis. Left renal stones. Coronary artery calcifications are seen.

## 2021-08-30 ENCOUNTER — Other Ambulatory Visit: Payer: Self-pay

## 2021-09-01 LAB — CULTURE, BLOOD (ROUTINE X 2)
Culture: NO GROWTH
Culture: NO GROWTH

## 2021-09-24 DIAGNOSIS — M79676 Pain in unspecified toe(s): Secondary | ICD-10-CM

## 2021-10-25 ENCOUNTER — Emergency Department
Admission: EM | Admit: 2021-10-25 | Discharge: 2021-10-25 | Disposition: A | Discharge status: Home / Self Care | Payer: Medicaid Other | Attending: Emergency Medicine | Admitting: Emergency Medicine

## 2021-10-25 ENCOUNTER — Other Ambulatory Visit: Payer: Self-pay

## 2021-10-25 DIAGNOSIS — R197 Diarrhea, unspecified: Secondary | ICD-10-CM | POA: Insufficient documentation

## 2021-10-25 DIAGNOSIS — E119 Type 2 diabetes mellitus without complications: Secondary | ICD-10-CM | POA: Diagnosis not present

## 2021-10-25 DIAGNOSIS — I10 Essential (primary) hypertension: Secondary | ICD-10-CM | POA: Diagnosis not present

## 2021-10-25 DIAGNOSIS — G546 Phantom limb syndrome with pain: Secondary | ICD-10-CM | POA: Insufficient documentation

## 2021-10-25 DIAGNOSIS — E871 Hypo-osmolality and hyponatremia: Secondary | ICD-10-CM | POA: Insufficient documentation

## 2021-10-25 DIAGNOSIS — R112 Nausea with vomiting, unspecified: Secondary | ICD-10-CM | POA: Diagnosis present

## 2021-10-25 DIAGNOSIS — R110 Nausea: Secondary | ICD-10-CM

## 2021-10-25 DIAGNOSIS — M79604 Pain in right leg: Secondary | ICD-10-CM

## 2021-10-25 LAB — CBC
HCT: 43 % (ref 39.0–52.0)
Hemoglobin: 14.9 g/dL (ref 13.0–17.0)
MCH: 30.8 pg (ref 26.0–34.0)
MCHC: 34.7 g/dL (ref 30.0–36.0)
MCV: 89 fL (ref 80.0–100.0)
Platelets: 96 10*3/uL — ABNORMAL LOW (ref 150–400)
RBC: 4.83 MIL/uL (ref 4.22–5.81)
RDW: 11.9 % (ref 11.5–15.5)
WBC: 5.5 10*3/uL (ref 4.0–10.5)
nRBC: 0 % (ref 0.0–0.2)

## 2021-10-25 LAB — URINALYSIS, ROUTINE W REFLEX MICROSCOPIC
Bacteria, UA: NONE SEEN
Bilirubin Urine: NEGATIVE
Glucose, UA: NEGATIVE mg/dL
Ketones, ur: NEGATIVE mg/dL
Leukocytes,Ua: NEGATIVE
Nitrite: NEGATIVE
Protein, ur: NEGATIVE mg/dL
Specific Gravity, Urine: 1.006 (ref 1.005–1.030)
Squamous Epithelial / HPF: NONE SEEN (ref 0–5)
pH: 5 (ref 5.0–8.0)

## 2021-10-25 LAB — COMPREHENSIVE METABOLIC PANEL
ALT: 24 U/L (ref 0–44)
AST: 21 U/L (ref 15–41)
Albumin: 4 g/dL (ref 3.5–5.0)
Alkaline Phosphatase: 68 U/L (ref 38–126)
Anion gap: 8 (ref 5–15)
BUN: 13 mg/dL (ref 8–23)
CO2: 25 mmol/L (ref 22–32)
Calcium: 9.2 mg/dL (ref 8.9–10.3)
Chloride: 95 mmol/L — ABNORMAL LOW (ref 98–111)
Creatinine, Ser: 0.61 mg/dL (ref 0.61–1.24)
GFR, Estimated: >60 mL/min (ref >60)
Glucose, Bld: 231 mg/dL — ABNORMAL HIGH (ref 70–99)
Potassium: 4.3 mmol/L (ref 3.5–5.1)
Sodium: 128 mmol/L — ABNORMAL LOW (ref 135–145)
Total Bilirubin: 0.8 mg/dL (ref 0.3–1.2)
Total Protein: 7.4 g/dL (ref 6.5–8.1)

## 2021-10-25 LAB — LIPASE, BLOOD: Lipase: 31 U/L (ref 11–51)

## 2021-10-25 MED ORDER — ONDANSETRON HCL 4 MG/2ML IJ SOLN
4.0000 mg | Freq: Once | INTRAMUSCULAR | Status: AC
  Administered 2021-10-25: 4 mg via INTRAVENOUS
  Filled 2021-10-25: qty 2

## 2021-10-25 MED ORDER — HYDROCODONE-ACETAMINOPHEN 5-325 MG PO TABS: 1.0000 | ORAL_TABLET | ORAL | 0 refills | Status: DC | PRN

## 2021-10-25 MED ORDER — ONDANSETRON 4 MG PO TBDP: 4.0000 mg | ORAL_TABLET | Freq: Three times a day (TID) | ORAL | 0 refills | Status: DC | PRN

## 2021-10-25 MED ORDER — SODIUM CHLORIDE 0.9 % IV BOLUS
1000.0000 mL | Freq: Once | INTRAVENOUS | Status: AC
  Administered 2021-10-25: 1000 mL via INTRAVENOUS

## 2021-10-25 NOTE — ED Triage Notes (Signed)
Pt to ED via POV from home. Pt reports N/V/D and abdominal pain. Pt denies blood in stool or emesis. Pt reports recent BKA from DM. Pt states he also ran out of bandages for his healing incision.  ?

## 2021-10-25 NOTE — ED Provider Notes (Signed)
? ?[facility] ?Provider Note ? ? ? Event Date/Time  ? First MD Initiated Contact with Patient 10/25/21 1855   ?  (approximate) ? ?History  ? ?Chief Complaint: Abdominal Pain ? ?HPI ? ?[name] is a 61 y.o. male with a past medical history of diabetes, prior DVT, hypertension, recent right BKA 2 months ago who presents to the emergency department for nausea vomiting diarrhea.  According to the patient for the past 5 days or so he has been intermittently nauseated with occasional episodes of vomiting and diarrhea.  He also states increased pain to his right lower extremity where he had a BKA 2 months ago.  Denies any fever.  Denies any significant abdominal pain does state cramping prior to bowel movements and also feels somewhat nauseated at times.  Patient also states he recently ran out of bandage supplies to wrap his right lower extremity and is asking for more supplies as he was unable to get to the pharmacy today due to transportation issues. ? ?Physical Exam  ? ?Triage Vital Signs: ?ED Triage Vitals  ?Enc Vitals Group  ?   BP 10/25/21 1759 (!) 155/93  ?   Pulse Rate 10/25/21 1759 94  ?   Resp 10/25/21 1759 18  ?   Temp 10/25/21 1759 98.3 ?F (36.8 ?C)  ?   Temp Source 10/25/21 1759 Oral  ?   SpO2 10/25/21 1759 100 %  ?   Weight --   ?   Height 10/25/21 1800 5\' 11"  (1.803 m)  ?   Head Circumference --   ?   Peak Flow --   ?   Pain Score 10/25/21 1800 0  ?   Pain Loc --   ?   Pain Edu? --   ?   Excl. in [place]? --   ? ? ?Most recent vital signs: ?Vitals:  ? 10/25/21 1759  ?BP: (!) 155/93  ?Pulse: 94  ?Resp: 18  ?Temp: 98.3 ?F (36.8 ?C)  ?SpO2: 100%  ? ? ?General: Awake, no distress.  ?CV:  Good peripheral perfusion.  Regular rate and rhythm  ?Resp:  Normal effort.  Equal breath sounds bilaterally.  ?Abd:  No distention.  Soft, nontender.  No rebound or guarding. ?Other:  Very well-appearing incision with just a small amount of scab on the incision.  No drainage.  No signs of infection or  cellulitis. ? ? ?ED Results / Procedures / Treatments  ? ?MEDICATIONS ORDERED IN ED: ?Medications  ?sodium chloride 0.9 % bolus 1,000 mL (has no administration in time range)  ?ondansetron (ZOFRAN) injection 4 mg (has no administration in time range)  ? ? ? ?IMPRESSION / MDM / ASSESSMENT AND PLAN / ED COURSE  ?I reviewed the triage vital signs and the nursing notes. ? ?Patient presents emergency department for several days of nausea vomiting diarrhea also pain around his right lower extremity stump.  Patient states he is out of dressing supplies as he was unable to make it to the pharmacy due to transportation issues.  I provided the patient with supplies.  Patient's lab work has resulted showing a normal CBC, chemistry overall reassuring but mild hyponatremia, not significantly changed from baseline however given the patient's nausea vomiting diarrhea we will place an IV, IV hydrate with a liter of normal saline as well as IV Zofran.  Patient has a benign abdomen with no tenderness identified.  Patient states he was taking Percocet for the right lower extremity pain but recently ran  out and is trying to get back in with his doctor to discuss pain management. ? ?Urinalysis is normal.  Overall patient appears well.  Lab work shows mild hyponatremia otherwise well patient has received fluids and feels better.  We will discharge home with a short course of pain medication, nausea medication.  Patient agreeable to plan of care. ? ?FINAL CLINICAL IMPRESSION(S) / ED DIAGNOSES  ? ?Nausea vomiting diarrhea ?Phantom limb pain ? ? ?Note:  This document was prepared using Dragon voice recognition software and may include unintentional dictation errors. ?  Minna Antis, MD ?10/25/21 2106 ? ?

## 2022-01-12 ENCOUNTER — Encounter: Payer: Self-pay | Admitting: Intensive Care

## 2022-01-12 ENCOUNTER — Emergency Department
Admission: EM | Admit: 2022-01-12 | Discharge: 2022-01-12 | Disposition: A | Discharge status: Home / Self Care | Payer: Medicaid Other | Attending: Emergency Medicine | Admitting: Emergency Medicine

## 2022-01-12 ENCOUNTER — Other Ambulatory Visit: Payer: Self-pay

## 2022-01-12 DIAGNOSIS — E119 Type 2 diabetes mellitus without complications: Secondary | ICD-10-CM | POA: Diagnosis not present

## 2022-01-12 DIAGNOSIS — I10 Essential (primary) hypertension: Secondary | ICD-10-CM | POA: Insufficient documentation

## 2022-01-12 DIAGNOSIS — Z4800 Encounter for change or removal of nonsurgical wound dressing: Secondary | ICD-10-CM | POA: Diagnosis not present

## 2022-01-12 DIAGNOSIS — Z5189 Encounter for other specified aftercare: Secondary | ICD-10-CM

## 2022-01-12 DIAGNOSIS — T148XXA Other injury of unspecified body region, initial encounter: Secondary | ICD-10-CM

## 2022-01-12 LAB — CBC WITH DIFFERENTIAL/PLATELET
Abs Immature Granulocytes: 0.02 10*3/uL (ref 0.00–0.07)
Basophils Absolute: 0 10*3/uL (ref 0.0–0.1)
Basophils Relative: 1 %
Eosinophils Absolute: 0.4 10*3/uL (ref 0.0–0.5)
Eosinophils Relative: 5 %
HCT: 50.8 % (ref 39.0–52.0)
Hemoglobin: 17.3 g/dL — ABNORMAL HIGH (ref 13.0–17.0)
Immature Granulocytes: 0 %
Lymphocytes Relative: 21 %
Lymphs Abs: 1.4 10*3/uL (ref 0.7–4.0)
MCH: 31.6 pg (ref 26.0–34.0)
MCHC: 34.1 g/dL (ref 30.0–36.0)
MCV: 92.9 fL (ref 80.0–100.0)
Monocytes Absolute: 0.6 10*3/uL (ref 0.1–1.0)
Monocytes Relative: 8 %
Neutro Abs: 4.5 10*3/uL (ref 1.7–7.7)
Neutrophils Relative %: 65 %
Platelets: 182 10*3/uL (ref 150–400)
RBC: 5.47 MIL/uL (ref 4.22–5.81)
RDW: 12.2 % (ref 11.5–15.5)
WBC: 6.8 10*3/uL (ref 4.0–10.5)
nRBC: 0 % (ref 0.0–0.2)

## 2022-01-12 LAB — COMPREHENSIVE METABOLIC PANEL
ALT: 19 U/L (ref 0–44)
AST: 16 U/L (ref 15–41)
Albumin: 4.2 g/dL (ref 3.5–5.0)
Alkaline Phosphatase: 71 U/L (ref 38–126)
Anion gap: 9 (ref 5–15)
BUN: 7 mg/dL — ABNORMAL LOW (ref 8–23)
CO2: 26 mmol/L (ref 22–32)
Calcium: 9.5 mg/dL (ref 8.9–10.3)
Chloride: 102 mmol/L (ref 98–111)
Creatinine, Ser: 0.8 mg/dL (ref 0.61–1.24)
GFR, Estimated: >60 mL/min (ref >60)
Glucose, Bld: 141 mg/dL — ABNORMAL HIGH (ref 70–99)
Potassium: 4.2 mmol/L (ref 3.5–5.1)
Sodium: 137 mmol/L (ref 135–145)
Total Bilirubin: 0.8 mg/dL (ref 0.3–1.2)
Total Protein: 7.1 g/dL (ref 6.5–8.1)

## 2022-01-12 LAB — URINALYSIS, ROUTINE W REFLEX MICROSCOPIC
Bilirubin Urine: NEGATIVE
Glucose, UA: NEGATIVE mg/dL
Hgb urine dipstick: NEGATIVE
Ketones, ur: NEGATIVE mg/dL
Leukocytes,Ua: NEGATIVE
Nitrite: NEGATIVE
Protein, ur: NEGATIVE mg/dL
Specific Gravity, Urine: 1.011 (ref 1.005–1.030)
pH: 5 (ref 5.0–8.0)

## 2022-01-12 MED ORDER — HYDROCODONE-ACETAMINOPHEN 5-325 MG PO TABS
2.0000 | ORAL_TABLET | Freq: Once | ORAL | Status: AC
  Administered 2022-01-12: 2 via ORAL
  Filled 2022-01-12: qty 2

## 2022-01-12 NOTE — Discharge Instructions (Addendum)
Please continue to take your antibiotics as prescribed by your doctor.  Please use Tylenol as needed for discomfort as written on the box.  Please follow-up with wound clinic this coming week as scheduled.  Return to the emergency department for any worsening signs of infection such as increased redness pain, or fever.

## 2022-01-12 NOTE — ED Provider Notes (Signed)
[facility] Provider Note    Event Date/Time   First MD Initiated Contact with Patient 01/12/22 1331     (approximate)  History   Chief Complaint: Wound Infection  HPI  [name] is a 61 y.o. male with a past medical history of diabetes, hypertension, DVT presents emergency department for evaluation of a right BKA stump.  According to the patient several weeks ago he had a right BKA amputation due to decreased blood flow to the area.  Patient states over the past week or 2 he has noticed an ulcer/scab form in the middle of the incision.  Patient was placed on Bactrim 2 days ago by his doctor and has a follow-up appointment with wound clinic this coming week.  Patient was concerned given the appearance of the scab today so he came to the emergency department for evaluation.  Patient denies any fever.  Patient does state pain in the area but states that has been since the amputation as well.  Physical Exam   Triage Vital Signs: ED Triage Vitals  Enc Vitals Group     BP 01/12/22 1132 120/84     Pulse Rate 01/12/22 1132 81     Resp 01/12/22 1132 16     Temp 01/12/22 1132 98.2 F (36.8 C)     Temp Source 01/12/22 1132 Oral     SpO2 01/12/22 1132 96 %     Weight 01/12/22 1133 195 lb (88.5 kg)     Height 01/12/22 1133 5\' 11"  (1.803 m)     Head Circumference --      Peak Flow --      Pain Score 01/12/22 1132 10     Pain Loc --      Pain Edu? --      Excl. in [place]? --     Most recent vital signs: Vitals:   01/12/22 1132  BP: 120/84  Pulse: 81  Resp: 16  Temp: 98.2 F (36.8 C)  SpO2: 96%    General: Awake, no distress.  CV:  Good peripheral perfusion.  Regular rate and rhythm  Resp:  Normal effort.  Equal breath sounds bilaterally.  Abd:  No distention.  Soft, nontender.  No rebound or guarding. Other:  Patient has a right BKA, does have an area approximately 4 x 2 cm consistent with resolving ulceration or dehiscence of the wound with an eschar  present.  No sign of abscess.  No significant surrounding erythema to suggest cellulitis.   ED Results / Procedures / Treatments   MEDICATIONS ORDERED IN ED: Medications  HYDROcodone-acetaminophen (NORCO/VICODIN) 5-325 MG per tablet 2 tablet (2 tablets Oral Given 01/12/22 1403)     IMPRESSION / MDM / ASSESSMENT AND PLAN / ED COURSE  I reviewed the triage vital signs and the nursing notes.  Patient's presentation is most consistent with acute presentation with potential threat to life or bodily function.  Patient presents to the emergency department for evaluation of his right BKA stump where the patient has an ulceration/dehiscence area.  He states that has been present for a week or more.  It appears to be resolving with an eschar in the middle of the wound, no signs of surrounding cellulitis no sign of abscess.  Patient is on an appropriate antibiotic (Bactrim) started 2 days ago.  Patient has a follow-up appointment with the wound clinic this coming week for recheck/reevaluation.  Patient's lab work in the emergency department is reassuring with a normal CBC including  a normal white blood cell count, reassuring chemistry.  Given the patient's reassuring lab work reassuring physical exam I believe the patient would be safe for discharge home to continue taking his Bactrim as prescribed by his doctor as well as following up with wound care.  Patient agreeable to plan of care.  FINAL CLINICAL IMPRESSION(S) / ED DIAGNOSES   Wound evaluation   Note:  This document was prepared using Dragon voice recognition software and may include unintentional dictation errors.   Minna Antis, MD 01/12/22 1413

## 2022-01-12 NOTE — ED Triage Notes (Signed)
Patient has BKA on right leg and noted wound to right stub. Reports it showed up a few weeks ago and was seen 2 days ago at clinic and given oral antibiotics. Minimal drainage noted, yellowish in color

## 2022-01-12 NOTE — ED Notes (Signed)
Pt gives verbal consent to DC and refuses help pushing wheel chair out.

## 2022-01-18 ENCOUNTER — Encounter: Payer: Medicaid Other | Attending: Physician Assistant | Admitting: Physician Assistant

## 2022-01-18 DIAGNOSIS — T8131XA Disruption of external operation (surgical) wound, not elsewhere classified, initial encounter: Secondary | ICD-10-CM | POA: Diagnosis present

## 2022-01-18 DIAGNOSIS — Z89511 Acquired absence of right leg below knee: Secondary | ICD-10-CM | POA: Insufficient documentation

## 2022-01-18 DIAGNOSIS — X58XXXA Exposure to other specified factors, initial encounter: Secondary | ICD-10-CM | POA: Diagnosis not present

## 2022-01-18 DIAGNOSIS — E1140 Type 2 diabetes mellitus with diabetic neuropathy, unspecified: Secondary | ICD-10-CM | POA: Diagnosis not present

## 2022-01-18 DIAGNOSIS — I10 Essential (primary) hypertension: Secondary | ICD-10-CM | POA: Diagnosis not present

## 2022-01-18 DIAGNOSIS — E11622 Type 2 diabetes mellitus with other skin ulcer: Secondary | ICD-10-CM | POA: Diagnosis not present

## 2022-01-18 DIAGNOSIS — F17200 Nicotine dependence, unspecified, uncomplicated: Secondary | ICD-10-CM | POA: Insufficient documentation

## 2022-01-18 DIAGNOSIS — L97812 Non-pressure chronic ulcer of other part of right lower leg with fat layer exposed: Secondary | ICD-10-CM | POA: Diagnosis not present

## 2022-01-18 NOTE — Progress Notes (Signed)
JONTAVIOUS, COMMONS ([id number]) Visit Report for 01/18/2022 Allergy List Details Patient Name: [name], [name]. Date of Service: 01/18/2022 10:00 AM Medical Record Number: [id number] Patient Account Number: [id number] Date of Birth/Sex: [birth year] (61 y.o. M) Treating RN: Angelina Pih Primary Care Lydiann Bonifas: Jodi Marble Other Clinician: Referring Jynesis Nakamura: Jodi Marble Treating Ryen Heitmeyer/Extender: Allen Derry Weeks in Treatment: 0 Allergies Active Allergies azithromycin Reaction: nervous breathing trouble Sulfa (Sulfonamide Antibiotics) Reaction: nervous erythromycin base Reaction: anxiety Xanax Benzodiazepines methocarbamol Reaction: anxiety Allergy Notes Electronic Signature(s) Signed: 01/18/2022 4:07:43 PM By: Angelina Pih Entered By: Angelina Pih on 01/18/2022 10:34:37 Kilgore, Richard Duffel ([id number]) -------------------------------------------------------------------------------- Arrival Information Details Patient Name: [name], [name] Date of Service: 01/18/2022 10:00 AM Medical Record Number: [id number] Patient Account Number: [id number] Date of Birth/Sex: [birth year] (61 y.o. M) Treating RN: Angelina Pih Primary Care Sylvan Lahm: Jodi Marble Other Clinician: Referring Kyair Ditommaso: Jodi Marble Treating Demarquez Ciolek/Extender: Rowan Blase in Treatment: 0 Visit Information Patient Arrived: Wheel Chair Arrival Time: 10:05 Accompanied By: self Transfer Assistance: EasyPivot Patient Lift Patient Identification Verified: Yes Secondary Verification Process Completed: Yes Patient Has Alerts: Yes Patient Alerts: Patient on Blood Thinner type 2 diabetic Electronic Signature(s) Signed: 01/18/2022 4:07:43 PM By: Angelina Pih Entered By: Angelina Pih on 01/18/2022 10:34:57 Kooy, Richard Duffel ([id number]) -------------------------------------------------------------------------------- Clinic Level of Care Assessment Details Patient Name: [name], [name] Date  of Service: 01/18/2022 10:00 AM Medical Record Number: [id number] Patient Account Number: [id number] Date of Birth/Sex: [birth year] (61 y.o. M) Treating RN: Angelina Pih Primary Care Lilliam Chamblee: Jodi Marble Other Clinician: Referring Noemi Bellissimo: Jodi Marble Treating Lasean Rahming/Extender: Rowan Blase in Treatment: 0 Clinic Level of Care Assessment Items TOOL 1 Quantity Score []  - Use when EandM and Procedure is performed on INITIAL visit 0 ASSESSMENTS - Nursing Assessment / Reassessment X - General Physical Exam (combine w/ comprehensive assessment (listed just below) when performed on new 1 20 pt. evals) X- 1 25 Comprehensive Assessment (HX, ROS, Risk Assessments, Wounds Hx, etc.) ASSESSMENTS - Wound and Skin Assessment / Reassessment []  - Dermatologic / Skin Assessment (not related to wound area) 0 ASSESSMENTS - Ostomy and/or Continence Assessment and Care []  - Incontinence Assessment and Management 0 []  - 0 Ostomy Care Assessment and Management (repouching, etc.) PROCESS - Coordination of Care X - Simple Patient / Family Education for ongoing care 1 15 []  - 0 Complex (extensive) Patient / Family Education for ongoing care X- 1 10 Staff obtains Consents, Records, Test Results / Process Orders []  - 0 Staff telephones HHA, Nursing Homes / Clarify orders / etc []  - 0 Routine Transfer to another Facility (non-emergent condition) []  - 0 Routine Hospital Admission (non-emergent condition) X- 1 15 New Admissions / / Ordering NPWT, Apligraf, etc. []  - 0 Emergency Hospital Admission (emergent condition) PROCESS - Special Needs []  - Pediatric / Minor Patient Management 0 []  - 0 Isolation Patient Management []  - 0 Hearing / Language / Visual special needs []  - 0 Assessment of Community assistance (transportation, D/C planning, etc.) []  - 0 Additional assistance / Altered mentation []  - 0 Support Surface(s) Assessment (bed, cushion, seat,  etc.) INTERVENTIONS - Miscellaneous []  - External ear exam 0 []  - 0 Patient Transfer (multiple staff / / Similar devices) []  - 0 Simple Staple / Suture removal (25 or less) []  - 0 Complex Staple / Suture removal (26 or more) []  - 0 Hypo/Hyperglycemic Management (do not check if billed separately) []  - 0 Ankle / Brachial Index (ABI) - do not check if billed separately  Has the patient been seen at the hospital within the last three years: Yes Total Score: 85 Level Of Care: New/Established - Level 3 [name], [name] ([id number]) Electronic Signature(s) Signed: 01/18/2022 4:07:43 PM By: Angelina Pih Entered By: Angelina Pih on 01/18/2022 12:40:02 Cisse, Richard Duffel ([id number]) -------------------------------------------------------------------------------- Encounter Discharge Information Details Patient Name: [name], [name] Date of Service: 01/18/2022 10:00 AM Medical Record Number: [id number] Patient Account Number: [id number] Date of Birth/Sex: Apr 21, 1961 (61 y.o. M) Treating RN: Angelina Pih Primary Care Xiomar Crompton: Jodi Marble Other Clinician: Referring Jurgen Groeneveld: Jodi Marble Treating Mycal Conde/Extender: Rowan Blase in Treatment: 0 Encounter Discharge Information Items Post Procedure Vitals Discharge Condition: Stable Temperature (F): 97.9 Ambulatory Status: Wheelchair Pulse (bpm): 75 Discharge Destination: Home Respiratory Rate (breaths/min): 18 Transportation: Other Blood Pressure (mmHg): 144/85 Accompanied By: self Schedule Follow-up Appointment: Yes Clinical Summary of Care: Electronic Signature(s) Signed: 01/18/2022 12:41:30 PM By: Angelina Pih Entered By: Angelina Pih on 01/18/2022 12:41:30 Wingard, Richard Duffel ([id number]) -------------------------------------------------------------------------------- Lower Extremity Assessment Details Patient Name: [name], [name] Date of Service: 01/18/2022 10:00 AM Medical Record Number:  [id number] Patient Account Number: [id number] Date of Birth/Sex: 07/03/1961 (61 y.o. M) Treating RN: Angelina Pih Primary Care Ayansh Feutz: Jodi Marble Other Clinician: Referring Elisheva Fallas: Jodi Marble Treating Sheril Hammond/Extender: Allen Derry Weeks in Treatment: 0 Electronic Signature(s) Signed: 01/18/2022 4:07:43 PM By: Angelina Pih Entered By: Angelina Pih on 01/18/2022 10:23:44 Deshpande, Richard Duffel ([id number]) -------------------------------------------------------------------------------- Multi Wound Chart Details Patient Name: [name], [name] Date of Service: 01/18/2022 10:00 AM Medical Record Number: [id number] Patient Account Number: [id number] Date of Birth/Sex: 18-Aug-1960 (61 y.o. M) Treating RN: Angelina Pih Primary Care Valina Maes: Jodi Marble Other Clinician: Referring Elouise Divelbiss: Jodi Marble Treating Oakleigh Hesketh/Extender: Rowan Blase in Treatment: 0 Vital Signs Height(in): 71 Pulse(bpm): 75 Weight(lbs): 198 Blood Pressure(mmHg): 144/85 Body Mass Index(BMI): 27.6 Temperature(F): 97.9 Respiratory Rate(breaths/min): 18 Photos: [N/A:N/A] Wound Location: Right Amputation Site - Below Knee N/A N/A Wounding Event: Surgical Injury N/A N/A Primary Etiology: Open Surgical Wound N/A N/A Comorbid History: Hypertension, Peripheral Venous N/A N/A Disease, Type II Diabetes, Neuropathy Date Acquired: 11/01/2021 N/A N/A Weeks of Treatment: 0 N/A N/A Wound Status: Open N/A N/A Wound Recurrence: No N/A N/A Clustered Wound: Yes N/A N/A Clustered Quantity: 2 N/A N/A Measurements L x W x D (cm) 2.8x5.7x0.2 N/A N/A Area (cm) : 12.535 N/A N/A Volume (cm) : 2.507 N/A N/A Classification: Full Thickness Without Exposed N/A N/A Support Structures Exudate Amount: Medium N/A N/A Exudate Type: Serosanguineous N/A N/A Exudate Color: red, brown N/A N/A Granulation Amount: None Present (0%) N/A N/A Necrotic Amount: Large (67-100%) N/A N/A Necrotic Tissue: Eschar,  Adherent Slough N/A N/A Epithelialization: None N/A N/A Treatment Notes Electronic Signature(s) Signed: 01/18/2022 4:07:43 PM By: Angelina Pih Entered By: Angelina Pih on 01/18/2022 10:48:00 [name], Richard Duffel ([id number]) -------------------------------------------------------------------------------- Multi-Disciplinary Care Plan Details Patient Name: [name], [name] Date of Service: 01/18/2022 10:00 AM Medical Record Number: [id number] Patient Account Number: [id number] Date of Birth/Sex: 1960-11-17 (61 y.o. M) Treating RN: Angelina Pih Primary Care Loribeth Katich: Jodi Marble Other Clinician: Referring Jessalynn Mccowan: Jodi Marble Treating Dorma Altman/Extender: Rowan Blase in Treatment: 0 Active Inactive Orientation to the Wound Care Program Nursing Diagnoses: Knowledge deficit related to the wound healing center program Goals: Patient/caregiver will verbalize understanding of the Wound Healing Center Program Date Initiated: 01/18/2022 Target Resolution Date: 01/25/2022 Goal Status: Active Interventions: Provide education on orientation to the wound center Notes: Wound/Skin Impairment Nursing Diagnoses: Impaired tissue integrity Knowledge deficit related to ulceration/compromised skin integrity Goals: Ulcer/skin breakdown will have a  volume reduction of 30% by week 4 Date Initiated: 01/18/2022 Target Resolution Date: 02/15/2022 Goal Status: Active Ulcer/skin breakdown will have a volume reduction of 50% by week 8 Date Initiated: 01/18/2022 Target Resolution Date: 03/15/2022 Goal Status: Active Ulcer/skin breakdown will have a volume reduction of 80% by week 12 Date Initiated: 01/18/2022 Target Resolution Date: 04/12/2022 Goal Status: Active Ulcer/skin breakdown will heal within 14 weeks Date Initiated: 01/18/2022 Target Resolution Date: 04/26/2022 Goal Status: Active Interventions: Assess patient/caregiver ability to obtain necessary supplies Assess patient/caregiver  ability to perform ulcer/skin care regimen upon admission and as needed Assess ulceration(s) every visit Provide education on ulcer and skin care Notes: Electronic Signature(s) Signed: 01/18/2022 4:07:43 PM By: Angelina Pih Entered By: Angelina Pih on 01/18/2022 10:47:46 Volker, Richard Duffel ([id number]) -------------------------------------------------------------------------------- Pain Assessment Details Patient Name: [name], [name] Date of Service: 01/18/2022 10:00 AM Medical Record Number: [id number] Patient Account Number: [id number] Date of Birth/Sex: [birth year] (61 y.o. M) Treating RN: Angelina Pih Primary Care Farouk Vivero: Jodi Marble Other Clinician: Referring Keaira Whitehurst: Jodi Marble Treating Anaeli Cornwall/Extender: Rowan Blase in Treatment: 0 Active Problems Location of Pain Severity and Description of Pain Patient Has Paino Yes Site Locations Rate the pain. Current Pain Level: 6 Pain Management and Medication Current Pain Management: Notes right amp site Electronic Signature(s) Signed: 01/18/2022 4:07:43 PM By: Angelina Pih Entered By: Angelina Pih on 01/18/2022 10:08:10 Andal, Richard Duffel ([id number]) -------------------------------------------------------------------------------- Patient/Caregiver Education Details Patient Name: [name], [name] Date of Service: 01/18/2022 10:00 AM Medical Record Number: [id number] Patient Account Number: [id number] Date of Birth/Gender: 1960/07/31 (61 y.o. M) Treating RN: Angelina Pih Primary Care Physician: Jodi Marble Other Clinician: Referring Physician: Jodi Marble Treating Physician/Extender: Rowan Blase in Treatment: 0 Education Assessment Education Provided To: Patient Education Topics Provided Welcome To The Wound Care Center: Handouts: Welcome To The Wound Care Center Methods: Explain/Verbal Responses: State content correctly Wound Debridement: Handouts: Wound Debridement Methods:  Explain/Verbal Responses: State content correctly Wound/Skin Impairment: Handouts: Caring for Your Ulcer Methods: Explain/Verbal Responses: State content correctly Electronic Signature(s) Signed: 01/18/2022 4:07:43 PM By: Angelina Pih Entered By: Angelina Pih on 01/18/2022 12:40:29 Satterwhite, Richard Duffel ([id number]) -------------------------------------------------------------------------------- Wound Assessment Details Patient Name: [name], [name] Date of Service: 01/18/2022 10:00 AM Medical Record Number: [id number] Patient Account Number: [id number] Date of Birth/Sex: 08/15/1960 (61 y.o. 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 Wound Status: Open Wounding Event: Surgical Injury Comorbid Hypertension, Peripheral Venous Disease, Type II Date Acquired: 11/01/2021 History: Diabetes, Neuropathy Weeks Of Treatment: 0 Clustered Wound: Yes Photos Wound Measurements Length: (cm) 2.8 Width: (cm) 5.7 Depth: (cm) 0.2 Clustered Quantity: 2 Area: (cm) 12.535 Volume: (cm) 2.507 % Reduction in Area: % Reduction in Volume: Epithelialization: None Tunneling: No Undermining: No Wound Description Classification: Full Thickness Without Exposed Support Structures Exudate Amount: Medium Exudate Type: Serosanguineous Exudate Color: red, brown Foul Odor After Cleansing: No Slough/Fibrino Yes Wound Bed Granulation Amount: None Present (0%) Necrotic Amount: Large (67-100%) Necrotic Quality: Eschar, Adherent Slough Treatment Notes Wound #1 (Amputation Site - Below Knee) Wound Laterality: Right Cleanser Soap and Water Discharge Instruction: Gently cleanse wound with antibacterial soap, rinse and pat dry prior to dressing  wounds Wound Cleanser Discharge Instruction: Wash your hands with soap and water. Remove old dressing, discard into plastic bag and place into trash. Cleanse the wound with Wound  Cleanser prior to applying a clean dressing using gauze sponges, not tissues or cotton balls. Do not scrub or use excessive force. Pat dry using gauze sponges, not tissue or cotton balls. Peri-Wound Care KENSINGTON, DUERST ([id number]) Topical Santyl Collagenase Ointment, 30 (gm), tube Discharge Instruction: apply nickel thick to wound bed only Primary Dressing Gauze Discharge Instruction: As directed: moistened with saline Secondary Dressing ABD Pad 5x9 (in/in) Discharge Instruction: Cover with ABD pad Secured With Tubigrip Size D, 3x10 (in/yd) Discharge Instruction: double layer Compression Wrap Compression Stockings Add-Ons Electronic Signature(s) Signed: 01/18/2022 4:07:43 PM By: Angelina Pih Entered By: Angelina Pih on 01/18/2022 10:27:45 Audino, Richard Duffel ([id number]) -------------------------------------------------------------------------------- Vitals Details Patient Name: [name], [name] Date of Service: 01/18/2022 10:00 AM Medical Record Number: [id number] Patient Account Number: [id number] Date of Birth/Sex: 17-Sep-1960 (61 y.o. M) Treating RN: Angelina Pih Primary Care Teodora Baumgarten: Jodi Marble Other Clinician: Referring Haille Pardi: Jodi Marble Treating Hadlei Stitt/Extender: Rowan Blase in Treatment: 0 Vital Signs Time Taken: 10:08 Temperature (F): 97.9 Height (in): 71 Pulse (bpm): 75 Source: Stated Respiratory Rate (breaths/min): 18 Weight (lbs): 198 Blood Pressure (mmHg): 144/85 Source: Stated Reference Range: 80 - 120 mg / dl Body Mass Index (BMI): 27.6 Electronic Signature(s) Signed: 01/18/2022 4:07:43 PM By: Angelina Pih Entered By: Angelina Pih on 01/18/2022 10:24:17

## 2022-01-18 NOTE — Progress Notes (Signed)
ANGELOS, WASCO ([id number]) Visit Report for 01/18/2022 Abuse Risk Screen Details Patient Name: [name], [name]. Date of Service: 01/18/2022 10:00 AM Medical Record Number: [id number] Patient Account Number: [id number] Date of Birth/Sex: 1961-01-08 (61 y.o. M) Treating RN: Angelina Pih Primary Care Crystal Scarberry: Jodi Marble Other Clinician: Referring Prathik Aman: Jodi Marble Treating Navy Rothschild/Extender: Rowan Blase in Treatment: 0 Abuse Risk Screen Items Answer ABUSE RISK SCREEN: Has anyone close to you tried to hurt or harm you recentlyo No Do you feel uncomfortable with anyone in your familyo No Has anyone forced you do things that you didnot want to doo No Electronic Signature(s) Signed: 01/18/2022 4:07:43 PM By: Angelina Pih Entered By: Angelina Pih on 01/18/2022 10:22:11 Younan, Pamala Duffel ([id number]) -------------------------------------------------------------------------------- Activities of Daily Living Details Patient Name: [name], [name] Date of Service: 01/18/2022 10:00 AM Medical Record Number: [id number] Patient Account Number: [id number] Date of Birth/Sex: 05-Jun-1961 (61 y.o. M) Treating RN: Angelina Pih Primary Care Bengie Kaucher: Jodi Marble Other Clinician: Referring Antoninette Lerner: Jodi Marble Treating Luz Mares/Extender: Rowan Blase in Treatment: 0 Activities of Daily Living Items Answer Activities of Daily Living (Please select one for each item) Drive Automobile Not Able Take Medications Completely Able Use Telephone Completely Able Care for Appearance Completely Able Use Toilet Completely Able Bath / Shower Completely Able Dress Self Completely Able Feed Self Completely Able Walk Completely Able Get In / Out Bed Completely Able Housework Completely Able Prepare Meals Completely Able Handle Money Completely Able Shop for Self Completely Able Electronic Signature(s) Signed: 01/18/2022 4:07:43 PM By: Angelina Pih Entered By:  Angelina Pih on 01/18/2022 10:22:36 Beyl, Pamala Duffel ([id number]) -------------------------------------------------------------------------------- Education Screening Details Patient Name: [name], [name] Date of Service: 01/18/2022 10:00 AM Medical Record Number: [id number] Patient Account Number: [id number] Date of Birth/Sex: 1961/05/02 (61 y.o. M) Treating RN: Angelina Pih Primary Care Jennilyn Esteve: Jodi Marble Other Clinician: Referring Sekai Nayak: Jodi Marble Treating Izayah Miner/Extender: Rowan Blase in Treatment: 0 Learning Preferences/Education Level/Primary Language Learning Preference: Explanation, Demonstration, Video, Communication Board, Printed Material Preferred Language: English Cognitive Barrier Language Barrier: No Translator Needed: No Memory Deficit: No Emotional Barrier: No Cultural/Religious Beliefs Affecting Medical Care: No Physical Barrier Impaired Vision: No Impaired Hearing: No Decreased Hand dexterity: No Knowledge/Comprehension Knowledge Level: High Comprehension Level: High Ability to understand written instructions: High Ability to understand verbal instructions: High Motivation Anxiety Level: Calm Cooperation: Cooperative Education Importance: Acknowledges Need Interest in Health Problems: Asks Questions Perception: Coherent Willingness to Engage in Self-Management High Activities: Readiness to Engage in Self-Management High Activities: Electronic Signature(s) Signed: 01/18/2022 4:07:43 PM By: Angelina Pih Entered By: Angelina Pih on 01/18/2022 10:22:56 Taitano, Pamala Duffel ([id number]) -------------------------------------------------------------------------------- Fall Risk Assessment Details Patient Name: [name], [name] Date of Service: 01/18/2022 10:00 AM Medical Record Number: [id number] Patient Account Number: [id number] Date of Birth/Sex: [birth year] (61 y.o. M) Treating RN: Angelina Pih Primary Care Grady Lucci:  Jodi Marble Other Clinician: Referring Shanautica Forker: Jodi Marble Treating Willis Holquin/Extender: Rowan Blase in Treatment: 0 Fall Risk Assessment Items Have you had 2 or more falls in the last 12 monthso 0 No Have you had any fall that resulted in injury in the last 12 monthso 0 No FALLS RISK SCREEN History of falling - immediate or within 3 months 0 No Secondary diagnosis (Do you have 2 or more medical diagnoseso) 0 No Ambulatory aid None/bed rest/wheelchair/nurse 0 Yes Crutches/cane/walker 0 No Furniture 0 No Intravenous therapy Access/Saline/Heparin Lock 0 No Gait/Transferring Normal/ bed rest/ wheelchair 0 Yes Weak (short steps with or without shuffle, stooped but able to  lift head while walking, may 0 No seek support from furniture) Impaired (short steps with shuffle, may have difficulty arising from chair, head down, impaired 0 No balance) Mental Status Oriented to own ability 0 Yes Electronic Signature(s) Signed: 01/18/2022 4:07:43 PM By: Angelina Pih Entered By: Angelina Pih on 01/18/2022 10:23:03 Fox, Pamala Duffel ([id number]) -------------------------------------------------------------------------------- Foot Assessment Details Patient Name: [name], [name] Date of Service: 01/18/2022 10:00 AM Medical Record Number: [id number] Patient Account Number: [id number] Date of Birth/Sex: 1960/12/11 (61 y.o. M) Treating RN: Angelina Pih Primary Care Farrah Skoda: Jodi Marble Other Clinician: Referring Kyonna Frier: Jodi Marble Treating Zeven Kocak/Extender: Allen Derry Weeks in Treatment: 0 Foot Assessment Items Site Locations + = Sensation present, - = Sensation absent, C = Callus, U = Ulcer R = Redness, W = Warmth, M = Maceration, PU = Pre-ulcerative lesion F = Fissure, S = Swelling, D = Dryness Assessment Right: Left: Other Deformity: No No Prior Foot Ulcer: No No Prior Amputation: No No Charcot Joint: No No Ambulatory Status: Gait: Notes not  completed due to below the knee amp on right Electronic Signature(s) Signed: 01/18/2022 4:07:43 PM By: Angelina Pih Entered By: Angelina Pih on 01/18/2022 10:23:34 Golonka, Pamala Duffel ([id number]) -------------------------------------------------------------------------------- Nutrition Risk Screening Details Patient Name: Keniston, Khye L. Date of Service: 01/18/2022 10:00 AM Medical Record Number: [id number] Patient Account Number: [id number] Date of Birth/Sex: 27-May-1961 (61 y.o. M) Treating RN: Angelina Pih Primary Care Sallyann Kinnaird: Jodi Marble Other Clinician: Referring Aquilla Voiles: Jodi Marble Treating Markie Frith/Extender: Allen Derry Weeks in Treatment: 0 Height (in): Weight (lbs): Body Mass Index (BMI): Nutrition Risk Screening Items Score Screening NUTRITION RISK SCREEN: I have an illness or condition that made me change the kind and/or amount of food I eat 0 No I eat fewer than two meals per day 0 No I eat few fruits and vegetables, or milk products 0 No I have three or more drinks of beer, liquor or wine almost every day 0 No I have tooth or mouth problems that make it hard for me to eat 0 No I don't always have enough money to buy the food I need 0 No I eat alone most of the time 0 No I take three or more different prescribed or over-the-counter drugs a day 0 No Without wanting to, I have lost or gained 10 pounds in the last six months 0 No I am not always physically able to shop, cook and/or feed myself 0 No Nutrition Protocols Good Risk Protocol 0 No interventions needed Moderate Risk Protocol High Risk Proctocol Risk Level: Good Risk Score: 0 Electronic Signature(s) Signed: 01/18/2022 4:07:43 PM By: Angelina Pih Entered By: Angelina Pih on 01/18/2022 10:23:08

## 2022-01-18 NOTE — Progress Notes (Signed)
Richard, [name] ([id number]) Visit Report for 01/18/2022 Chief Complaint Document Details Patient Name: [name], [name]. Date of Service: 01/18/2022 10:00 AM Medical Record Number: [id number] Patient Account Number: [id number] Date of Birth/Sex: June 26, 1961 (61 y.o. M) Treating RN: Angelina Pih Primary Care Provider: Jodi Marble Other Clinician: Referring Provider: Jodi Marble Treating Provider/Extender: Rowan Blase in Treatment: 0 Information Obtained from: Patient Chief Complaint Right BKA Ulcer Electronic Signature(s) Signed: 01/18/2022 10:45:35 AM By: Lenda Kelp PA-C Entered By: Lenda Kelp on 01/18/2022 10:45:35 [name] ([id number]) -------------------------------------------------------------------------------- Debridement Details Patient Name: [name], [name] Date of Service: 01/18/2022 10:00 AM Medical Record Number: [id number] Patient Account Number: [id number] Date of Birth/Sex: 05-01-1961 (61 y.o. M) Treating RN: Angelina Pih Primary Care Provider: Jodi Marble Other Clinician: Referring Provider: Jodi Marble Treating Provider/Extender: Rowan Blase in Treatment: 0 Debridement Performed for Wound #1 Right Amputation Site - Below Knee Assessment: Performed By: Physician Nelida Meuse., PA-C Debridement Type: Debridement Level of Consciousness (Pre- Awake and Alert procedure): Pre-procedure Verification/Time Out Yes - 10:53 Taken: Pain Control: Lidocaine 4% Topical Solution Total Area Debrided (L x W): 2.8 (cm) x 5.7 (cm) = 15.96 (cm) Tissue and other material Viable, Non-Viable, Eschar, Slough, Subcutaneous, Biofilm, Slough debrided: Level: Skin/Subcutaneous Tissue Debridement Description: Excisional Instrument: Curette Bleeding: Minimum Hemostasis Achieved: Pressure Response to Treatment: Procedure was tolerated well Level of Consciousness (Post- Awake and Alert procedure): Post Debridement Measurements of  Total Wound Length: (cm) 2.8 Width: (cm) 5.7 Depth: (cm) 0.2 Volume: (cm) 2.507 Character of Wound/Ulcer Post Debridement: Stable Post Procedure Diagnosis Same as Pre-procedure Electronic Signature(s) Signed: 01/18/2022 4:07:43 PM By: Angelina Pih Signed: 01/18/2022 5:53:34 PM By: Lenda Kelp PA-C Entered By: Angelina Pih on 01/18/2022 10:55:55 [name] ([id number]) -------------------------------------------------------------------------------- HPI Details Patient Name: [name], [name] Date of Service: 01/18/2022 10:00 AM Medical Record Number: [id number] Patient Account Number: [id number] Date of Birth/Sex: Jun 24, 1961 (61 y.o. 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 This actually was initially amputated and he was in the hospital during late January early February 2003. Subsequently the patient tells me that initially it seemed to be doing okay then it slowly open this really got worse around May he tells me. With that being said he does tell me that [facility] also [facility] where he was given Bactrim he went to the ER 2 days later and was told that it "looked fine according to what the patient tells me today. I have not independently reviewed those records. Currently he has been using a mixture of cleaning this with peroxide and applying baby oil and Vaseline to the area. He also notes that the dark eschar area really does not seem to be changing. He really likely needs to have some debridement of clearway some of the necrotic  debris. With that being said he does have a history of diabetes mellitus type 2 as well as hypertension and his most recent hemoglobin A1c was 10.0 that was 06-30-2021 as far as the chart and what I could see. He is currently on Medicaid. This is only pertinent and that it will affect our ability to  get Santyl for him we will probably have to go through the patient assistance program with [employer] and [employer]. Electronic Signature(s) Signed: 01/18/2022 5:27:36 PM By: Lenda Kelp PA-C Entered By: Lenda Kelp on 01/18/2022 17:27:36 [name] ([id number]) -------------------------------------------------------------------------------- Physical Exam Details Patient Name: [name], [name] Date of Service: 01/18/2022 10:00 AM Medical Record Number: [id number] Patient Account Number: [id number] Date of Birth/Sex: 09-Apr-1961 (61 y.o. M) Treating RN: Angelina Pih Primary Care Provider: Jodi Marble Other Clinician: Referring Provider: Jodi Marble Treating Provider/Extender: Rowan Blase in Treatment: 0 Constitutional patient is hypertensive.. pulse regular and within target range for patient.Marland Kitchen respirations regular, non-labored and within target range for patient.Marland Kitchen temperature within target range for patient.. Well-nourished and well-hydrated in no acute distress. Eyes conjunctiva clear no eyelid edema noted. pupils equal round and reactive to light and accommodation. Ears, Nose, Mouth, and Throat no gross abnormality of ear auricles or external auditory canals. normal hearing noted during conversation. mucus membranes moist. Respiratory normal breathing without difficulty. Cardiovascular 2+ dorsalis pedis/posterior tibialis pulses. no clubbing, cyanosis, significant edema, <3 sec cap refill. Musculoskeletal normal gait and posture. no significant deformity or arthritic changes, no loss or range of motion, no clubbing. Psychiatric this patient is able to make decisions and  demonstrates good insight into disease process. Alert and Oriented x 3. pleasant and cooperative. Notes Upon inspection patient's wound bed actually did require some sharp debridement attempted to perform debridement of clearway some of the necrotic debris and he tolerated this very little today to be peripherally honest. After I debrided the wound he actually did have some good pain and I was not able to get everything off that I really would have like to but it was better than when we started by far. I do think continuing this with Melburn Popper is probably good to be the best way to go and I discussed that with the patient today as well. Working on therefore try to get this through the patient assistance program for him. Electronic Signature(s) Signed: 01/18/2022 5:28:14 PM By: Lenda Kelp PA-C Entered By: Lenda Kelp on 01/18/2022 17:28:14 [name], [name] ([id number]) -------------------------------------------------------------------------------- Physician Orders Details Patient Name: [name], [name] Date of Service: 01/18/2022 10:00 AM Medical Record Number: [id number] Patient Account Number: [id number] Date of Birth/Sex: 07-11-1960 (61 y.o. 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 o Nurse Visit as needed Bathing/ Shower/ Hygiene o Wash wounds with antibacterial soap and  water. - Dial original gold recommended o May shower; gently cleanse wound with antibacterial soap, rinse and pat dry prior to dressing wounds o No tub bath. Edema Control - Lymphedema / Segmental Compressive Device / Other o Tubigrip double layer applied - Size D applied to amputation site to help control swelling. May remove at bedtime and replace on first thing in the morning o Elevate, Exercise Daily and Avoid Standing for Long Periods of Time. o Elevate leg(s) parallel to the floor when sitting. o DO YOUR BEST to sleep in the bed at night.  DO NOT sleep in your recliner. Long hours of sitting in a recliner leads to swelling of the legs and/or potential wounds on your backside. Wound Treatment Wound #1 - Amputation Site - Below Knee Wound Laterality: Right Cleanser: Soap and Water 1 x Per Day/30 Days Discharge Instructions: Gently cleanse wound with antibacterial soap, rinse and pat dry prior to dressing wounds Cleanser: Wound Cleanser 1 x Per Day/30 Days Discharge Instructions: Wash your hands with soap and water. Remove old dressing, discard into plastic bag and place into trash. Cleanse the wound with Wound Cleanser prior to applying a clean dressing using gauze sponges, not tissues or cotton balls. Do not scrub or use excessive force. Pat dry using gauze sponges, not tissue or cotton balls. Topical: Santyl Collagenase Ointment, 30 (gm), tube 1 x Per Day/30 Days Discharge Instructions: apply nickel thick to wound bed only Primary Dressing: Gauze 1 x Per Day/30 Days Discharge Instructions: As directed: moistened with saline Secondary Dressing: ABD Pad 5x9 (in/in) 1 x Per Day/30 Days Discharge Instructions: Cover with ABD pad Secured With: Tubigrip Size D, 3x10 (in/yd) 1 x Per Day/30 Days Discharge Instructions: double layer Patient Medications Allergies: azithromycin, Sulfa (Sulfonamide Antibiotics), erythromycin base, Xanax, Benzodiazepines, methocarbamol Notifications  Medication Indication Start End Santyl 01/18/2022 Seifer, Jahlon L. ([id number]) Notifications Medication Indication Start End DOSE topical 250 unit/gram ointment - Apply nickel thick daily to the wound bed and then cover with a dressing as directed in clinic x 30 days Electronic Signature(s) Signed: 01/18/2022 5:53:34 PM By: Lenda Kelp PA-C Entered By: Lenda Kelp on 01/18/2022 11:15:48 [name], [name] ([id number]) -------------------------------------------------------------------------------- Prescription 01/18/2022 Patient Name: Cindra Eves L. Provider: Allen Derry PA-C Date of Birth: 1961-02-04 NPI#: [id number] Sex: Judie Petit DEA#: [id number] Phone #: [phone] License #: Patient Address: [facility] [address] [facility] [address] [place], [place] [address] [address] [place], [place] [address] [phone] Allergies azithromycin; Sulfa (Sulfonamide Antibiotics); erythromycin base; Xanax; Benzodiazepines; methocarbamol Medication Medication: Route: Strength: Form: Santyl topical 250 unit/gram ointment Class: TOPICAL/MUCOUS MEMBR./SUBCUT. ENZYMES Dose: Frequency / Time: Indication: Apply nickel thick daily to the wound bed and then cover with a dressing as directed in clinic x 30 days Number of Refills: Number of Units: 2 Ninety (90) Gram(s) Generic Substitution: Start Date: End Date: Administered at Facility: Substitution Permitted 01/18/2022 No Note to Pharmacy: Patient's wound size is 2.8 cm x 5.7 cm -- Manufacturer's Note: The total quantity has been calculated in grams due to Santyl being available in either 30 or 90 gram tubes. Hand Signature: Date(s): Electronic Signature(s) Signed: 01/18/2022 5:53:34 PM By: Lenda Kelp PA-C Entered By: Lenda Kelp on 01/18/2022 11:15:48 Single, [name]  ([id number]) --------------------------------------------------------------------------------  Problem List Details Patient Name: Shearman, Deamonte L. Date of Service: 01/18/2022 10:00 AM Medical Record Number: [id number] Patient Account Number: [id number] Date of Birth/Sex: [birth year] (61 y.o. M) Treating RN: Angelina Pih Primary Care Provider: Jodi Marble Other Clinician: Referring Provider: Jodi Marble Treating Provider/Extender: Rowan Blase in Treatment: 0 Active Problems ICD-10 Encounter Code Description Active Date MDM Diagnosis T81.31XA Disruption of external operation (surgical) wound, not elsewhere 01/18/2022 No Yes classified, initial encounter Z89.511 Acquired absence of right leg below knee 01/18/2022 No Yes L97.812 Non-pressure chronic ulcer of other part of right lower leg with fat layer 01/18/2022 No Yes exposed E11.622 Type 2 diabetes mellitus with other skin ulcer 01/18/2022 No Yes I10 Essential (primary) hypertension 01/18/2022 No Yes Inactive Problems Resolved Problems Electronic Signature(s)  Signed: 01/18/2022 10:45:22 AM By: Lenda KelpStone III, Jozlin Bently PA-C Entered By: Lenda KelpStone III, Nashla Althoff on 01/18/2022 10:45:22 Bovenzi, Richard DuffelSAMUEL L. ([id number]) -------------------------------------------------------------------------------- Progress Note Details Patient Name: Holst, Kory L. Date of Service: 01/18/2022 10:00 AM Medical Record Number: [id number] Patient Account Number: [id number] Date of Birth/Sex: [birth year] (61 y.o. M) Treating RN: Angelina PihGordon, Caitlin Primary Care Provider: Jodi MarbleStephenson, Lars Other Clinician: Referring Provider: Jodi MarbleStephenson, Lars Treating Provider/Extender: Rowan BlaseStone, Salimatou Simone Weeks in Treatment: 0 Subjective Chief Complaint Information obtained from Patient Right BKA Ulcer History of Present Illness (HPI) 01-18-2022 upon evaluation today patient appears for initial evaluation here in the clinic concerning amputation of the right lower extremity with significant  swelling of the stump which is a below-knee amputation. This actually was initially amputated and he was in the hospital during late January early February 2003. Subsequently the patient tells me that initially it seemed to be doing okay then it slowly open this really got worse around May he tells me. With that being said he does tell me that he also was [facility] on the 10th where he was given Bactrim he went to the ER 2 days later and was told that it "looked fine according to what the patient tells me today. I have not independently reviewed those records. Currently he has been using a mixture of cleaning this with peroxide and applying baby oil and Vaseline to the area. He also notes that the dark eschar area really does not seem to be changing. He really likely needs to have some debridement of clearway some of the necrotic debris. With that being said he does have a history of diabetes mellitus type 2 as well as hypertension and his most recent hemoglobin A1c was 10.0 that was 06-30-2021 as far as the chart and what I could see. He is currently on Medicaid. This is only pertinent and that it will affect our ability to get Santyl for him we will probably have to go through the patient assistance program with [employer] and [employer]. Patient History Information obtained from Patient. Allergies azithromycin (Reaction: nervous breathing trouble), Sulfa (Sulfonamide Antibiotics) (Reaction: nervous), erythromycin base (Reaction: anxiety), Xanax, Benzodiazepines, methocarbamol (Reaction: anxiety) Social History Current every day smoker, Alcohol Use - Rarely, Drug Use - No History, Caffeine Use - Daily - coffee. Medical History Cardiovascular Patient has history of Hypertension, Peripheral Venous Disease Endocrine Patient has history of Type II Diabetes Neurologic Patient has history of Neuropathy Patient is treated with Insulin, Oral Agents. Blood sugar is not  tested. Medical And Surgical History Notes Musculoskeletal arthritis Review of Systems (ROS) Constitutional Symptoms (General Health) Denies complaints or symptoms of Fatigue, Fever, Chills, Marked Weight Change. Eyes Denies complaints or symptoms of Dry Eyes, Vision Changes, Glasses / Contacts. Ear/Nose/Mouth/Throat Denies complaints or symptoms of Difficult clearing ears, Sinusitis. Hematologic/Lymphatic Denies complaints or symptoms of Bleeding / Clotting Disorders, Human Immunodeficiency Virus. Respiratory Denies complaints or symptoms of Chronic or frequent coughs, Shortness of Breath. Gastrointestinal Denies complaints or symptoms of Frequent diarrhea, Nausea, Vomiting. Genitourinary Denies complaints or symptoms of Kidney failure/ Dialysis, Incontinence/dribbling. Immunological Denies complaints or symptoms of Hives, Itching. Integumentary (Skin) Denies complaints or symptoms of Wounds, Bleeding or bruising tendency, Breakdown, Swelling. Psychiatric depression [name], [name] ([id number]) Objective Constitutional patient is hypertensive.. pulse regular and within target range for patient.Marland Kitchen. respirations regular, non-labored and within target range for patient.Marland Kitchen. temperature within target range for patient.. Well-nourished and well-hydrated in no acute distress. Vitals Time Taken: 10:08 AM, Height: 71 in, Source:  Stated, Weight: 198 lbs, Source: Stated, BMI: 27.6, Temperature: 97.9 F, Pulse: 75 bpm, Respiratory Rate: 18 breaths/min, Blood Pressure: 144/85 mmHg. Eyes conjunctiva clear no eyelid edema noted. pupils equal round and reactive to light and accommodation. Ears, Nose, Mouth, and Throat no gross abnormality of ear auricles or external auditory canals. normal hearing noted during conversation. mucus membranes moist. Respiratory normal breathing without difficulty. Cardiovascular 2+ dorsalis pedis/posterior tibialis pulses. no clubbing, cyanosis, significant  edema, Musculoskeletal normal gait and posture. no significant deformity or arthritic changes, no loss or range of motion, no clubbing. Psychiatric this patient is able to make decisions and demonstrates good insight into disease process. Alert and Oriented x 3. pleasant and cooperative. General Notes: Upon inspection patient's wound bed actually did require some sharp debridement attempted to perform debridement of clearway some of the necrotic debris and he tolerated this very little today to be peripherally honest. After I debrided the wound he actually did have some good pain and I was not able to get everything off that I really would have like to but it was better than when we started by far. I do think continuing this with Melburn Popper is probably good to be the best way to go and I discussed that with the patient today as well. Working on therefore try to get this through the patient assistance program for him. Integumentary (Hair, Skin) Wound #1 status is Open. Original cause of wound was Surgical Injury. The date acquired was: 11/01/2021. The wound is located on the Right Amputation Site - Below Knee. The wound measures 2.8cm length x 5.7cm width x 0.2cm depth; 12.535cm^2 area and 2.507cm^3 volume. There is no tunneling or undermining noted. There is a medium amount of serosanguineous drainage noted. There is no granulation within the wound bed. There is a large (67-100%) amount of necrotic tissue within the wound bed including Eschar and Adherent Slough. 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 There was a Excisional Skin/Subcutaneous Tissue Debridement with a total area of 15.96 sq cm performed by Nelida Meuse., PA-C. With the following instrument(s): Curette to remove Viable and Non-Viable tissue/material. Material removed includes Eschar, Subcutaneous Tissue, Slough, and Biofilm after achieving pain control using Lidocaine 4% Topical Solution. No specimens were taken. A time out was conducted at 10:53, prior to the start of the procedure. A Minimum amount of bleeding was controlled with Pressure. The procedure was tolerated well. Post Debridement Measurements: 2.8cm length x 5.7cm width x 0.2cm depth; 2.507cm^3 volume. [name], [name] ([id number]) Character of Wound/Ulcer Post Debridement is stable. Post procedure Diagnosis Wound #1: Same as Pre-Procedure Plan Follow-up Appointments: Return Appointment in 1 week. Nurse Visit as needed Bathing/ Shower/ Hygiene: Wash wounds with antibacterial soap and water. - Dial original gold recommended May shower; gently cleanse wound with antibacterial soap, rinse and pat dry prior to dressing wounds No tub bath. Edema Control - Lymphedema / Segmental Compressive Device / Other: Tubigrip double layer applied - Size D applied to amputation site to help control swelling. May remove at bedtime and replace on first thing in the morning Elevate, Exercise Daily and Avoid Standing for Long  Periods of Time. Elevate leg(s) parallel to the floor when sitting. DO YOUR BEST to sleep in the bed at night. DO NOT sleep in your recliner. Long hours of sitting in a recliner leads to swelling of the legs and/or potential wounds on your backside. The following medication(s) was prescribed: Santyl topical 250 unit/gram ointment Apply nickel thick daily to the wound bed and then cover with a dressing as directed in clinic x 30 days starting 01/18/2022 WOUND #1: - Amputation Site - Below Knee Wound Laterality: Right Cleanser: Soap  and Water 1 x Per Day/30 Days Discharge Instructions: Gently cleanse wound with antibacterial soap, rinse and pat dry prior to dressing wounds Cleanser: Wound Cleanser 1 x Per Day/30 Days Discharge Instructions: Wash your hands with soap and water. Remove old dressing, discard into plastic bag and place into trash. Cleanse the wound with Wound Cleanser prior to applying a clean dressing using gauze sponges, not tissues or cotton balls. Do not scrub or use excessive force. Pat dry using gauze sponges, not tissue or cotton balls. Topical: Santyl Collagenase Ointment, 30 (gm), tube 1 x Per Day/30 Days Discharge Instructions: apply nickel thick to wound bed only Primary Dressing: Gauze 1 x Per Day/30 Days Discharge Instructions: As directed: moistened with saline Secondary Dressing: ABD Pad 5x9 (in/in) 1 x Per Day/30 Days Discharge Instructions: Cover with ABD pad Secured With: Tubigrip Size D, 3x10 (in/yd) 1 x Per Day/30 Days Discharge Instructions: double layer 1. I would recommend currently that we going continue with the wound care measures as before and the patient is in agreement with plan. This includes the use of the Santyl to the amputation site this should be covered with a saline moistened gauze. 2. I am also can recommend an ABD pad to cover and roll gauze to secure in place. 3. I would recommend Tubigrip size D double layer used over the stump in order to help shrink this is much as possible to get some of the edema out which hopefully should help it heal much more effectively and quickly. We will see patient back for reevaluation in 1 week here in the clinic. If anything worsens or changes patient will contact our office for additional recommendations. Electronic Signature(s) Signed: 01/18/2022 5:29:02 PM By: Lenda Kelp PA-C Entered By: Lenda Kelp on 01/18/2022 17:29:02 [name], [name]  ([id number]) -------------------------------------------------------------------------------- ROS/PFSH Details Patient Name: Zamorano, Aaryav L. Date of Service: 01/18/2022 10:00 AM Medical Record Number: [id number] Patient Account Number: [id number] Date of Birth/Sex: [birth year] (61 y.o. M) Treating RN: Angelina Pih Primary Care Provider: Jodi Marble Other Clinician: Referring Provider: Jodi Marble Treating Provider/Extender: Rowan Blase in Treatment: 0 Information Obtained From Patient Constitutional Symptoms (General Health) Complaints and Symptoms: Negative for: Fatigue; Fever; Chills; Marked Weight Change Eyes Complaints and Symptoms: Negative for: Dry Eyes; Vision Changes; Glasses / Contacts Ear/Nose/Mouth/Throat Complaints and Symptoms: Negative for: Difficult clearing ears; Sinusitis Hematologic/Lymphatic Complaints and Symptoms: Negative for: Bleeding / Clotting Disorders; Human Immunodeficiency Virus Respiratory Complaints and Symptoms: Negative for: Chronic or frequent coughs; Shortness of Breath Gastrointestinal Complaints and Symptoms: Negative for: Frequent diarrhea; Nausea; Vomiting Genitourinary Complaints and Symptoms: Negative for: Kidney failure/ Dialysis; Incontinence/dribbling Immunological Complaints and Symptoms: Negative for: Hives; Itching Integumentary (Skin) Complaints and Symptoms: Negative for: Wounds; Bleeding or bruising tendency; Breakdown; Swelling Cardiovascular Medical History: Positive for: Hypertension; Peripheral Venous Disease Endocrine Medical History: Positive for: Type II Diabetes Time with diabetes: 13 years [name], DAFT. ([id number]) Treated with: Insulin, Oral agents Blood sugar tested  every day: No Musculoskeletal Medical History: Past Medical History Notes: arthritis Neurologic Medical History: Positive for: Neuropathy Oncologic Psychiatric Complaints and Symptoms: Review of System  Notes: depression Immunizations Pneumococcal Vaccine: Received Pneumococcal Vaccination: No Implantable Devices None Family and Social History Current every day smoker; Alcohol Use: Rarely; Drug Use: No History; Caffeine Use: Daily - coffee Electronic Signature(s) Signed: 01/18/2022 4:07:43 PM By: Angelina Pih Signed: 01/18/2022 5:53:34 PM By: Lenda Kelp PA-C Entered By: Angelina Pih on 01/18/2022 10:22:05 [name], [name] ([id number]) -------------------------------------------------------------------------------- SuperBill Details Patient Name: Grimaldo, Ante L. Date of Service: 01/18/2022 Medical Record Number: [id number] Patient Account Number: [id number] Date of Birth/Sex: 05/24/1961 (61 y.o. 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 E11.622 Type 2 diabetes mellitus with other skin ulcer I10 Essential (primary) hypertension Facility Procedures CPT4 Code: 42683419 Description: 99213 - WOUND CARE VISIT-LEV 3 EST PT Modifier: Quantity: 1 CPT4 Code: 62229798 Description: 11042 - DEB SUBQ TISSUE 20 SQ CM/< Modifier: Quantity: 1 CPT4 Code: Description: ICD-10 Diagnosis Description L97.812 Non-pressure chronic ulcer of other part of right lower leg with fat layer Modifier: exposed Quantity: Physician Procedures CPT4 Code: 9211941 Description: WC PHYS LEVEL 3 o NEW PT Modifier: 25 Quantity: 1 CPT4 Code: Description: ICD-10 Diagnosis Description T81.31XA Disruption of external operation (surgical) wound, not elsewhere classifie Z89.511  Acquired absence of right leg below knee L97.812 Non-pressure chronic ulcer of other part of right lower leg with fat  layer E11.622 Type 2 diabetes mellitus with other skin ulcer Modifier: d, initial encounter exposed Quantity: CPT4 Code: 7408144 Description: 11042 - WC PHYS SUBQ TISS 20 SQ CM Modifier: Quantity: 1 CPT4 Code: Description: ICD-10 Diagnosis Description L97.812 Non-pressure chronic ulcer of other part of right lower leg with fat layer Modifier: exposed Quantity: Electronic Signature(s) Signed: 01/18/2022 5:29:31 PM By: Lenda Kelp PA-C Entered By: Lenda Kelp on 01/18/2022 17:29:31

## 2022-01-25 ENCOUNTER — Ambulatory Visit: Payer: Medicaid Other | Admitting: Internal Medicine

## 2022-02-01 ENCOUNTER — Ambulatory Visit: Payer: Medicaid Other | Admitting: Physician Assistant

## 2022-02-08 ENCOUNTER — Ambulatory Visit: Payer: Medicaid Other | Admitting: Physician Assistant

## 2022-03-07 ENCOUNTER — Emergency Department: Payer: Medicaid Other

## 2022-03-07 ENCOUNTER — Encounter: Payer: Self-pay | Admitting: Emergency Medicine

## 2022-03-07 ENCOUNTER — Emergency Department
Admission: EM | Admit: 2022-03-07 | Discharge: 2022-03-07 | Discharge status: Against Medical Advice | Payer: Medicaid Other | Source: Home / Self Care | Attending: Student in an Organized Health Care Education/Training Program | Admitting: Student in an Organized Health Care Education/Training Program

## 2022-03-07 ENCOUNTER — Other Ambulatory Visit: Payer: Self-pay

## 2022-03-07 ENCOUNTER — Emergency Department
Admission: EM | Admit: 2022-03-07 | Discharge: 2022-03-07 | Discharge status: Against Medical Advice | Payer: Medicaid Other | Attending: Emergency Medicine | Admitting: Emergency Medicine

## 2022-03-07 DIAGNOSIS — Z5329 Procedure and treatment not carried out because of patient's decision for other reasons: Secondary | ICD-10-CM | POA: Insufficient documentation

## 2022-03-07 DIAGNOSIS — M25561 Pain in right knee: Secondary | ICD-10-CM | POA: Diagnosis present

## 2022-03-07 DIAGNOSIS — L03115 Cellulitis of right lower limb: Secondary | ICD-10-CM | POA: Insufficient documentation

## 2022-03-07 DIAGNOSIS — E1165 Type 2 diabetes mellitus with hyperglycemia: Secondary | ICD-10-CM | POA: Insufficient documentation

## 2022-03-07 DIAGNOSIS — F17200 Nicotine dependence, unspecified, uncomplicated: Secondary | ICD-10-CM | POA: Insufficient documentation

## 2022-03-07 DIAGNOSIS — Z89511 Acquired absence of right leg below knee: Secondary | ICD-10-CM | POA: Insufficient documentation

## 2022-03-07 LAB — COMPREHENSIVE METABOLIC PANEL
ALT: 18 U/L (ref 0–44)
AST: 19 U/L (ref 15–41)
Albumin: 4.2 g/dL (ref 3.5–5.0)
Alkaline Phosphatase: 70 U/L (ref 38–126)
Anion gap: 6 (ref 5–15)
BUN: 12 mg/dL (ref 8–23)
CO2: 25 mmol/L (ref 22–32)
Calcium: 9.7 mg/dL (ref 8.9–10.3)
Chloride: 103 mmol/L (ref 98–111)
Creatinine, Ser: 0.73 mg/dL (ref 0.61–1.24)
GFR, Estimated: >60 mL/min (ref >60)
Glucose, Bld: 253 mg/dL — ABNORMAL HIGH (ref 70–99)
Potassium: 4.3 mmol/L (ref 3.5–5.1)
Sodium: 134 mmol/L — ABNORMAL LOW (ref 135–145)
Total Bilirubin: 1 mg/dL (ref 0.3–1.2)
Total Protein: 7.3 g/dL (ref 6.5–8.1)

## 2022-03-07 LAB — LACTIC ACID, PLASMA: Lactic Acid, Venous: 1.9 mmol/L (ref 0.5–1.9)

## 2022-03-07 LAB — CBC WITH DIFFERENTIAL/PLATELET
Abs Immature Granulocytes: 0.03 10*3/uL (ref 0.00–0.07)
Basophils Absolute: 0.1 10*3/uL (ref 0.0–0.1)
Basophils Relative: 1 %
Eosinophils Absolute: 0.4 10*3/uL (ref 0.0–0.5)
Eosinophils Relative: 5 %
HCT: 49.2 % (ref 39.0–52.0)
Hemoglobin: 17 g/dL (ref 13.0–17.0)
Immature Granulocytes: 0 %
Lymphocytes Relative: 20 %
Lymphs Abs: 1.6 10*3/uL (ref 0.7–4.0)
MCH: 32.4 pg (ref 26.0–34.0)
MCHC: 34.6 g/dL (ref 30.0–36.0)
MCV: 93.9 fL (ref 80.0–100.0)
Monocytes Absolute: 0.7 10*3/uL (ref 0.1–1.0)
Monocytes Relative: 9 %
Neutro Abs: 5.4 10*3/uL (ref 1.7–7.7)
Neutrophils Relative %: 65 %
Platelets: 160 10*3/uL (ref 150–400)
RBC: 5.24 MIL/uL (ref 4.22–5.81)
RDW: 12.6 % (ref 11.5–15.5)
WBC: 8.2 10*3/uL (ref 4.0–10.5)
nRBC: 0 % (ref 0.0–0.2)

## 2022-03-07 LAB — PROTIME-INR
INR: 0.9 (ref 0.8–1.2)
Prothrombin Time: 12.4 seconds (ref 11.4–15.2)

## 2022-03-07 LAB — CBG MONITORING, ED: Glucose-Capillary: 359 mg/dL — ABNORMAL HIGH (ref 70–99)

## 2022-03-07 MED ORDER — OXYCODONE HCL 5 MG PO TABS
5.0000 mg | ORAL_TABLET | Freq: Three times a day (TID) | ORAL | 0 refills | Status: DC | PRN
Start: 2022-03-07 — End: 2022-03-23

## 2022-03-07 MED ORDER — AMOXICILLIN-POT CLAVULANATE 500-125 MG PO TABS: 1.0000 | ORAL_TABLET | Freq: Three times a day (TID) | ORAL | 0 refills | Status: AC

## 2022-03-07 MED ORDER — INSULIN ASPART 100 UNIT/ML IJ SOLN
8.0000 [IU] | Freq: Once | INTRAMUSCULAR | Status: AC
  Administered 2022-03-07: 8 [IU] via SUBCUTANEOUS
  Filled 2022-03-07: qty 1

## 2022-03-07 MED ORDER — AMOXICILLIN-POT CLAVULANATE 875-125 MG PO TABS
1.0000 | ORAL_TABLET | Freq: Once | ORAL | Status: AC
Start: 2022-03-07 — End: 2022-03-07
  Administered 2022-03-07: 1 via ORAL
  Filled 2022-03-07: qty 1

## 2022-03-07 NOTE — ED Triage Notes (Signed)
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 Pt left the ED but has returned.

## 2022-03-07 NOTE — ED Provider Notes (Signed)
[facility] Provider Note    Event Date/Time   First MD Initiated Contact with Patient 03/07/22 1629     (approximate)   History   Wound Check   HPI  [name] is a 61 y.o. male patient Re presents to the ER for evaluation of wound to the right BKA site was seen here earlier for same symptoms but eloped.  Rie presents to the ER.  Has been having weeks of leg pain and worsening redness.  Has not followed up with wound care clinic since July.  No fevers or chills.  Having worsening aching pain does continue to smoke.  Does have history of diabetes and states has been compliant with his medications but on review of his record patient has extensive history of noncompliance and leaving AMA.     Physical Exam   Triage Vital Signs: ED Triage Vitals [03/07/22 1525]  Enc Vitals Group     BP (!) 156/80     Pulse Rate 95     Resp 16     Temp 98.4 F (36.9 C)     Temp Source Oral     SpO2 100 %     Weight 195 lb 15.8 oz (88.9 kg)     Height 5\' 11"  (1.803 m)     Head Circumference      Peak Flow      Pain Score 0     Pain Loc      Pain Edu?      Excl. in GC?     Most recent vital signs: Vitals:   03/07/22 1525  BP: (!) 156/80  Pulse: 95  Resp: 16  Temp: 98.4 F (36.9 C)  SpO2: 100%     Constitutional: Alert  Eyes: Conjunctivae are normal.  Head: Atraumatic. Nose: No congestion/rhinnorhea. Mouth/Throat: Mucous membranes are moist.   Neck: Painless ROM.  Cardiovascular:   Good peripheral circulation. Respiratory: Normal respiratory effort.  No retractions.  Gastrointestinal: Soft and nontender.  Musculoskeletal: Right BKA with cellulitic changes no purulence or palpable abscess.  Brisk cap refill.  See media from photo taken earlier today. Neurologic:  MAE spontaneously. No gross focal neurologic deficits are appreciated.  Skin:  Skin is warm, dry and intact. No rash noted. Psychiatric: Mood and affect are normal. Speech and behavior are  normal.    ED Results / Procedures / Treatments   Labs (all labs ordered are listed, but only abnormal results are displayed) Labs Reviewed  CBG MONITORING, ED - Abnormal; Notable for the following components:      Result Value   Glucose-Capillary 359 (*)    All other components within normal limits     EKG     RADIOLOGY Please see ED Course for my review and interpretation.  I personally reviewed all radiographic images ordered to evaluate for the above acute complaints and reviewed radiology reports and findings.  These findings were personally discussed with the patient.  Please see medical record for radiology report.    PROCEDURES:  Critical Care performed: No  Procedures   MEDICATIONS ORDERED IN ED: Medications  amoxicillin-clavulanate (AUGMENTIN) 875-125 MG per tablet 1 tablet (has no administration in time range)  insulin aspart (novoLOG) injection 8 Units (8 Units Subcutaneous Given 03/07/22 1655)     IMPRESSION / MDM / ASSESSMENT AND PLAN / ED COURSE  I reviewed the triage vital signs and the nursing notes.  Differential diagnosis includes, but is not limited to, cellulitis, abscess, gangrene, PAD, osteo-  Patient presented to the ER for evaluation of symptoms as described above.  Blood work earlier today drawn without any evidence of sepsis he is not febrile.  Symptoms seem more subacute.  His presentation is complicated by diabetes continued smoking severe PAD and history of significant noncompliance and frequently leaving AGAINST MEDICAL ADVICE.   Clinical Course as of 03/07/22 1848  Mon Mar 07, 2022  1650 Patient's repeat CBG is significantly elevated at 350.  On review of blood work from earlier today not consistent with DKA do not feel that repeat blood work clinically indicated at this time.  Will give insulin will order x-ray [PR]  1650 . [PR]  1730 Based on patient's history of noncompliance poorly controlled  diabetes and smoking I have recommended hospitalization for IV antibiotics.  Patient states that he is unwilling to stay and would like to try oral antibiotics and says that he will return if symptoms worsen.  I have informed him that I am concerned that the symptoms will almost inevitably worsen unless we are aggressive in treating it now.  Discussed my concern that he has a high risk of losing this limb.  Also was given insulin given his hyperglycemia which also discussed this concern with him.  Regardless after our discussion patient was unwilling to stay in the hospital and will sign out AMA.  I will prescribe antibiotic and short course of pain medication after checking his PMP.  Encouraged to return to the ER should he change his mind or if symptoms worsen.   [PR]    Clinical Course User Index [PR] Willy Eddy, MD    FINAL CLINICAL IMPRESSION(S) / ED DIAGNOSES   Final diagnoses:  Cellulitis of right lower extremity     Rx / DC Orders   ED Discharge Orders          Ordered    amoxicillin-clavulanate (AUGMENTIN) 500-125 MG tablet  3 times daily        03/07/22 1728    oxyCODONE (ROXICODONE) 5 MG immediate release tablet  Every 8 hours PRN        03/07/22 1728             Note:  This document was prepared using Dragon voice recognition software and may include unintentional dictation errors.    Willy Eddy, MD 03/07/22 2152

## 2022-03-07 NOTE — ED Triage Notes (Signed)
Pt here with an abscess to his right BKA. Pt has been going to the wound clinc but states his wound has not gotten any better.

## 2022-03-07 NOTE — ED Provider Triage Note (Signed)
Emergency Medicine Provider Triage Evaluation Note  [name] , a 61 y.o. male  was evaluated in triage.  Pt complains of diabetic wound to his right BKA.  Patient with open ulcerated wound to his right stump, previously being followed by the [facility] wound care center, but reports he discontinued his care there several months ago.  Has been trying to manage the wound at home with limited benefit.  He presents today due to concern for ongoing open wound.  Denies any purulent drainage, fevers, chills, or sweats.  Review of Systems  Positive: Right BKA stump wound Negative: FCS  Physical Exam  BP (!) 150/82 (BP Location: Left Arm)   Pulse 88   Temp 97.6 F (36.4 C) (Oral)   Resp 16   Ht 5\' 11"  (1.803 m)   Wt 88.9 kg   SpO2 94%   BMI 27.33 kg/m  Gen:   Awake, no distress  NAD Resp:  Normal effort CTA MSK:   Moves extremities without difficulty  SKIN:  Right BKA stump with a 4 cm circular superficial ulceration with some eschar tissue and mild granulation.  No weeping or purulence appreciated.  Surrounding erythema is noted.  Medical Decision Making  Medically screening exam initiated at 11:20 AM.  Appropriate orders placed.  was informed that the remainder of the evaluation will be completed by another provider, this initial triage assessment does not replace that evaluation, and the importance of remaining in the ED until their evaluation is complete.  Patient to the ED for evaluation of right BKA stump wound.   Lawrence Marseilles, PA-C 03/07/22 1126

## 2022-03-07 NOTE — ED Notes (Signed)
See triage note  Presents with possible abscess/infection at amputation site  Area is red  No fever  No drainage  Has been going to wound center for same

## 2022-03-07 NOTE — ED Notes (Signed)
Pt not in room after being seen by provider

## 2022-03-07 NOTE — ED Provider Notes (Signed)
[facility] Provider Note    Event Date/Time   First MD Initiated Contact with Patient 03/07/22 1129     (approximate)   History   Abscess   HPI  [name] is a 61 y.o. male history of type 2 diabetes, DVT previous right above-the-knee amputation  Patient reports that for several weeks he has had redness and pain about his right knee.  His roommate who he lives with drove him to the clinic today, but came here realizing it was closed.  He reports he feels like he needs to be back on antibiotic  No fevers or chills.  Reports increasing pain and some drainage from a slow to nonhealing ulcer overlying his right leg stump site.  Reports it will shoot pain up his leg.  No nausea or vomiting.  It has drained some slight yellowish drainage at times.  Denies fever or chills though.  The area has become progressively more red.  He follows with the wound clinic as well.  Reports he has not recently been on antibiotic but he is concerned the area is not getting better         Physical Exam   Triage Vital Signs: ED Triage Vitals  Enc Vitals Group     BP 03/07/22 1102 (!) 150/82     Pulse Rate 03/07/22 1102 88     Resp 03/07/22 1102 16     Temp 03/07/22 1102 97.6 F (36.4 C)     Temp Source 03/07/22 1102 Oral     SpO2 03/07/22 1102 94 %     Weight 03/07/22 1102 196 lb (88.9 kg)     Height 03/07/22 1102 5\' 11"  (1.803 m)     Head Circumference --      Peak Flow --      Pain Score 03/07/22 1118 10     Pain Loc --      Pain Edu? --      Excl. in GC? --     Most recent vital signs: Vitals:   03/07/22 1102  BP: (!) 150/82  Pulse: 88  Resp: 16  Temp: 97.6 F (36.4 C)  SpO2: 94%     General: Awake, no distress.  Mobile in his wheelchair without difficulty CV:  Good peripheral perfusion.  Right lower extremity stump site is warm and red to touch with normal preserved capillary refill. Resp:  Normal effort.  Abd:  No distention.  Other:  Right  above-the-knee amputation, skin is clean dry intact without evidence of dehiscence except he has a large approximately 2-1/2 cm fairly ovoid ulcerated skin lesion overlying the anterior and superior region.  Please see media upload.  There is tenderness erythema and warmth surrounding the stump site incision and slight surrounding the ulceration.  The ulcer does not appear to penetrate into muscular or fascial layers, but does appear to have evidence of very mild drainage which appears slightly purulent.  Patient oriented.  Does not appear in severe pain.  Eating a bag of potato chips and wheeling about without difficulty.  He is fully oriented  ED Results / Procedures / Treatments   Labs (all labs ordered are listed, but only abnormal results are displayed) Labs Reviewed  COMPREHENSIVE METABOLIC PANEL - Abnormal; Notable for the following components:      Result Value   Sodium 134 (*)    Glucose, Bld 253 (*)    All other components within normal limits  CULTURE, BLOOD (ROUTINE X 2)  CULTURE, BLOOD (ROUTINE X 2)  LACTIC ACID, PLASMA  CBC WITH DIFFERENTIAL/PLATELET  PROTIME-INR  LACTIC ACID, PLASMA  URINALYSIS, ROUTINE W REFLEX MICROSCOPIC     EKG     RADIOLOGY     PROCEDURES:  Critical Care performed: No  Procedures   MEDICATIONS ORDERED IN ED: Medications - No data to display   IMPRESSION / MDM / ASSESSMENT AND PLAN / ED COURSE  I reviewed the triage vital signs and the nursing notes.                              Differential diagnosis includes, but is not limited to, chronic surgical site infection, progression to cellulitis, wound infection, potential underlying abscess, phlegmon, cellulitis etc. symptoms do not appear to be systemic at this time.  Vital signs normal with exception of mild hypertension.  No leukocytosis, no tachycardia no fever no evidence of sepsis to noted at this time.  Have some concern about the amount of erythema of the ulceration and the  swelling that the patient reports is slowly progressed over the last couple weeks.  I am concerned about soft tissue infection cellulitis or potentially developing underlying abscess.   Patient's presentation is most consistent with acute complicated illness / injury requiring diagnostic workup.   Clinical Course as of 03/07/22 1247  Mon Mar 07, 2022  1142 Patient reports that he needs to go outside to smoke a cigarette.  I advised him that he recommended against that and instead recommended we consider starting him on antibiotics and pain control, offered nicotine brought here, patient advised that he is going to go outside and smoke cigarette and then come back again.  Again, strongly cautioned him not to do this, but he assures me he is going to go outside and come back in. [MQ]    Clinical Course User Index [MQ] Sharyn Creamer, MD   Lactic Acid normal.   ----------------------------------------- 12:44 PM on 03/07/2022 ----------------------------------------- Attempted to call the patient, he did not answer.  Left voicemail requesting return phone call.  I was able to reach out to his emergency contact his friend Annice Pih.  Annice Pih tells me that he lives with him, and that he realized that his leg was getting infected and needs to get seen by a doctor, but also tells me that Mr. Hagins frequently likes to "do things his own way" and that he had just received a call from him that he wanted to come home to take a shower before coming back to the hospital. Annice Pih advised me that he is planning to pick him up, and allow him to go home and shower, and then bring him back to the ER for further treatment.  I was unable to reach the patient, who has evidently stepped away from the ER at this point.  He wheeled himself out presumptively to go take a smoke break but it sounds like he is planning to go home possibly take a shower, and return later.  Patient essentially eloped.  I am certainly concerned about  his amputation site and it being infected possibly with significant cellulitis or abscess and I discussed with the patient that I recommended he would need to be treated with antibiotics for infection.  However, the patient at that point had told me he needed to go outside to smoke a cigarette which I had advised him not to and to continue his care and offered him additional treatment.  Patient however on his own accord though left without receiving antibiotic.  That does sound like a plan in place at least talking with Annice Pih and having spoken with the patient that he may return hopefully to get antibiotic treatment and further care, but at this point is not insured.  I was unable to reach the patient and did leave him a voicemail though that is HIPAA compliant requesting a call back to the ER right away so I can further discuss his case with him.  FINAL CLINICAL IMPRESSION(S) / ED DIAGNOSES   Final diagnoses:  Cellulitis of leg, right     Rx / DC Orders   ED Discharge Orders     None        Note:  This document was prepared using Dragon voice recognition software and may include unintentional dictation errors.   Sharyn Creamer, MD 03/07/22 1247

## 2022-03-12 LAB — CULTURE, BLOOD (ROUTINE X 2): Culture: NO GROWTH

## 2022-03-18 ENCOUNTER — Encounter: Payer: Self-pay | Admitting: Emergency Medicine

## 2022-03-18 ENCOUNTER — Inpatient Hospital Stay
Admission: EM | Admit: 2022-03-18 | Discharge: 2022-03-23 | DRG: 475 | Disposition: A | Discharge status: Home Health | Payer: Medicaid Other | Attending: Internal Medicine | Admitting: Internal Medicine

## 2022-03-18 ENCOUNTER — Emergency Department: Payer: Medicaid Other

## 2022-03-18 ENCOUNTER — Other Ambulatory Visit: Payer: Self-pay

## 2022-03-18 DIAGNOSIS — L02415 Cutaneous abscess of right lower limb: Secondary | ICD-10-CM

## 2022-03-18 DIAGNOSIS — E11622 Type 2 diabetes mellitus with other skin ulcer: Secondary | ICD-10-CM | POA: Diagnosis present

## 2022-03-18 DIAGNOSIS — E1169 Type 2 diabetes mellitus with other specified complication: Secondary | ICD-10-CM | POA: Diagnosis present

## 2022-03-18 DIAGNOSIS — M86161 Other acute osteomyelitis, right tibia and fibula: Secondary | ICD-10-CM | POA: Diagnosis not present

## 2022-03-18 DIAGNOSIS — Z882 Allergy status to sulfonamides status: Secondary | ICD-10-CM

## 2022-03-18 DIAGNOSIS — F4323 Adjustment disorder with mixed anxiety and depressed mood: Secondary | ICD-10-CM | POA: Diagnosis present

## 2022-03-18 DIAGNOSIS — I739 Peripheral vascular disease, unspecified: Secondary | ICD-10-CM | POA: Diagnosis not present

## 2022-03-18 DIAGNOSIS — L03115 Cellulitis of right lower limb: Secondary | ICD-10-CM | POA: Diagnosis present

## 2022-03-18 DIAGNOSIS — E1165 Type 2 diabetes mellitus with hyperglycemia: Secondary | ICD-10-CM | POA: Diagnosis present

## 2022-03-18 DIAGNOSIS — T8743 Infection of amputation stump, right lower extremity: Secondary | ICD-10-CM | POA: Diagnosis present

## 2022-03-18 DIAGNOSIS — K5900 Constipation, unspecified: Secondary | ICD-10-CM | POA: Diagnosis not present

## 2022-03-18 DIAGNOSIS — Z8249 Family history of ischemic heart disease and other diseases of the circulatory system: Secondary | ICD-10-CM | POA: Diagnosis not present

## 2022-03-18 DIAGNOSIS — G479 Sleep disorder, unspecified: Secondary | ICD-10-CM | POA: Diagnosis not present

## 2022-03-18 DIAGNOSIS — Z794 Long term (current) use of insulin: Secondary | ICD-10-CM | POA: Diagnosis not present

## 2022-03-18 DIAGNOSIS — Y835 Amputation of limb(s) as the cause of abnormal reaction of the patient, or of later complication, without mention of misadventure at the time of the procedure: Secondary | ICD-10-CM | POA: Diagnosis present

## 2022-03-18 DIAGNOSIS — M24561 Contracture, right knee: Secondary | ICD-10-CM | POA: Diagnosis present

## 2022-03-18 DIAGNOSIS — L97919 Non-pressure chronic ulcer of unspecified part of right lower leg with unspecified severity: Secondary | ICD-10-CM | POA: Diagnosis present

## 2022-03-18 DIAGNOSIS — Z20822 Contact with and (suspected) exposure to covid-19: Secondary | ICD-10-CM | POA: Diagnosis present

## 2022-03-18 DIAGNOSIS — E1142 Type 2 diabetes mellitus with diabetic polyneuropathy: Secondary | ICD-10-CM | POA: Diagnosis present

## 2022-03-18 DIAGNOSIS — Z881 Allergy status to other antibiotic agents status: Secondary | ICD-10-CM

## 2022-03-18 DIAGNOSIS — Z7984 Long term (current) use of oral hypoglycemic drugs: Secondary | ICD-10-CM

## 2022-03-18 DIAGNOSIS — I10 Essential (primary) hypertension: Secondary | ICD-10-CM | POA: Diagnosis present

## 2022-03-18 DIAGNOSIS — E1140 Type 2 diabetes mellitus with diabetic neuropathy, unspecified: Secondary | ICD-10-CM | POA: Diagnosis present

## 2022-03-18 DIAGNOSIS — F17210 Nicotine dependence, cigarettes, uncomplicated: Secondary | ICD-10-CM | POA: Diagnosis present

## 2022-03-18 DIAGNOSIS — E1152 Type 2 diabetes mellitus with diabetic peripheral angiopathy with gangrene: Secondary | ICD-10-CM | POA: Diagnosis present

## 2022-03-18 DIAGNOSIS — Z91199 Patient's noncompliance with other medical treatment and regimen due to unspecified reason: Secondary | ICD-10-CM

## 2022-03-18 DIAGNOSIS — Z79899 Other long term (current) drug therapy: Secondary | ICD-10-CM

## 2022-03-18 DIAGNOSIS — D696 Thrombocytopenia, unspecified: Secondary | ICD-10-CM

## 2022-03-18 DIAGNOSIS — M869 Osteomyelitis, unspecified: Secondary | ICD-10-CM | POA: Diagnosis present

## 2022-03-18 DIAGNOSIS — Z888 Allergy status to other drugs, medicaments and biological substances status: Secondary | ICD-10-CM

## 2022-03-18 DIAGNOSIS — Z833 Family history of diabetes mellitus: Secondary | ICD-10-CM

## 2022-03-18 DIAGNOSIS — E871 Hypo-osmolality and hyponatremia: Secondary | ICD-10-CM

## 2022-03-18 DIAGNOSIS — G546 Phantom limb syndrome with pain: Secondary | ICD-10-CM | POA: Diagnosis present

## 2022-03-18 DIAGNOSIS — Z7902 Long term (current) use of antithrombotics/antiplatelets: Secondary | ICD-10-CM

## 2022-03-18 DIAGNOSIS — E785 Hyperlipidemia, unspecified: Secondary | ICD-10-CM | POA: Diagnosis present

## 2022-03-18 DIAGNOSIS — Z86718 Personal history of other venous thrombosis and embolism: Secondary | ICD-10-CM

## 2022-03-18 DIAGNOSIS — M1990 Unspecified osteoarthritis, unspecified site: Secondary | ICD-10-CM | POA: Diagnosis present

## 2022-03-18 DIAGNOSIS — A419 Sepsis, unspecified organism: Secondary | ICD-10-CM

## 2022-03-18 LAB — URINALYSIS, COMPLETE (UACMP) WITH MICROSCOPIC
Bacteria, UA: NONE SEEN
Bilirubin Urine: NEGATIVE
Glucose, UA: >=500 mg/dL — AB
Hgb urine dipstick: NEGATIVE
Ketones, ur: NEGATIVE mg/dL
Leukocytes,Ua: NEGATIVE
Nitrite: NEGATIVE
Protein, ur: NEGATIVE mg/dL
Specific Gravity, Urine: 1.008 (ref 1.005–1.030)
Squamous Epithelial / HPF: NONE SEEN (ref 0–5)
pH: 5 (ref 5.0–8.0)

## 2022-03-18 LAB — CBC WITH DIFFERENTIAL/PLATELET
Abs Immature Granulocytes: 0.03 10*3/uL (ref 0.00–0.07)
Basophils Absolute: 0.1 10*3/uL (ref 0.0–0.1)
Basophils Relative: 1 %
Eosinophils Absolute: 0.5 10*3/uL (ref 0.0–0.5)
Eosinophils Relative: 7 %
HCT: 47.5 % (ref 39.0–52.0)
Hemoglobin: 16.3 g/dL (ref 13.0–17.0)
Immature Granulocytes: 0 %
Lymphocytes Relative: 22 %
Lymphs Abs: 1.5 10*3/uL (ref 0.7–4.0)
MCH: 32.3 pg (ref 26.0–34.0)
MCHC: 34.3 g/dL (ref 30.0–36.0)
MCV: 94.1 fL (ref 80.0–100.0)
Monocytes Absolute: 0.5 10*3/uL (ref 0.1–1.0)
Monocytes Relative: 8 %
Neutro Abs: 4.4 10*3/uL (ref 1.7–7.7)
Neutrophils Relative %: 62 %
Platelets: 186 10*3/uL (ref 150–400)
RBC: 5.05 MIL/uL (ref 4.22–5.81)
RDW: 12.3 % (ref 11.5–15.5)
WBC: 7.1 10*3/uL (ref 4.0–10.5)
nRBC: 0 % (ref 0.0–0.2)

## 2022-03-18 LAB — PROTIME-INR
INR: 1 (ref 0.8–1.2)
Prothrombin Time: 13.3 seconds (ref 11.4–15.2)

## 2022-03-18 LAB — HEMOGLOBIN A1C
Hgb A1c MFr Bld: 8.1 % — ABNORMAL HIGH (ref 4.8–5.6)
Mean Plasma Glucose: 185.77 mg/dL

## 2022-03-18 LAB — COMPREHENSIVE METABOLIC PANEL
ALT: 20 U/L (ref 0–44)
AST: 17 U/L (ref 15–41)
Albumin: 3.8 g/dL (ref 3.5–5.0)
Alkaline Phosphatase: 74 U/L (ref 38–126)
Anion gap: 7 (ref 5–15)
BUN: 9 mg/dL (ref 8–23)
CO2: 27 mmol/L (ref 22–32)
Calcium: 9.4 mg/dL (ref 8.9–10.3)
Chloride: 100 mmol/L (ref 98–111)
Creatinine, Ser: 0.75 mg/dL (ref 0.61–1.24)
GFR, Estimated: >60 mL/min (ref >60)
Glucose, Bld: 335 mg/dL — ABNORMAL HIGH (ref 70–99)
Potassium: 4.4 mmol/L (ref 3.5–5.1)
Sodium: 134 mmol/L — ABNORMAL LOW (ref 135–145)
Total Bilirubin: 0.7 mg/dL (ref 0.3–1.2)
Total Protein: 6.7 g/dL (ref 6.5–8.1)

## 2022-03-18 LAB — RESP PANEL BY RT-PCR (FLU A&B, COVID) ARPGX2
Influenza A by PCR: NEGATIVE
Influenza B by PCR: NEGATIVE
SARS Coronavirus 2 by RT PCR: NEGATIVE

## 2022-03-18 LAB — APTT: aPTT: 29 seconds (ref 24–36)

## 2022-03-18 LAB — LACTIC ACID, PLASMA
Lactic Acid, Venous: 2.4 mmol/L (ref 0.5–1.9)
Lactic Acid, Venous: 1.4 mmol/L (ref 0.5–1.9)

## 2022-03-18 MED ORDER — FENTANYL 12 MCG/HR TD PT72
1.0000 | MEDICATED_PATCH | TRANSDERMAL | Status: DC
  Administered 2022-03-18 – 2022-03-21 (×2): 1 via TRANSDERMAL
  Filled 2022-03-18 (×2): qty 1

## 2022-03-18 MED ORDER — MORPHINE SULFATE (PF) 4 MG/ML IV SOLN
4.0000 mg | Freq: Once | INTRAVENOUS | Status: AC
  Administered 2022-03-18: 4 mg via INTRAVENOUS
  Filled 2022-03-18: qty 1

## 2022-03-18 MED ORDER — HYDROCODONE-ACETAMINOPHEN 5-325 MG PO TABS: 1.0000 | ORAL_TABLET | ORAL | Status: DC | PRN

## 2022-03-18 MED ORDER — ENOXAPARIN SODIUM 40 MG/0.4ML IJ SOSY
40.0000 mg | PREFILLED_SYRINGE | INTRAMUSCULAR | Status: DC
  Administered 2022-03-18 – 2022-03-22 (×5): 40 mg via SUBCUTANEOUS
  Filled 2022-03-18 (×5): qty 0.4

## 2022-03-18 MED ORDER — ATORVASTATIN CALCIUM 20 MG PO TABS
20.0000 mg | ORAL_TABLET | Freq: Every day | ORAL | Status: DC
  Administered 2022-03-18 – 2022-03-22 (×5): 20 mg via ORAL
  Filled 2022-03-18 (×5): qty 1

## 2022-03-18 MED ORDER — SODIUM CHLORIDE 0.9 % IV SOLN
2.0000 g | Freq: Once | INTRAVENOUS | Status: AC
  Administered 2022-03-18: 2 g via INTRAVENOUS
  Filled 2022-03-18: qty 20

## 2022-03-18 MED ORDER — SODIUM CHLORIDE 0.9 % IV SOLN
2.0000 g | Freq: Every day | INTRAVENOUS | Status: DC
  Administered 2022-03-19 – 2022-03-21 (×3): 2 g via INTRAVENOUS
  Filled 2022-03-18 (×2): qty 20
  Filled 2022-03-18: qty 2

## 2022-03-18 MED ORDER — LACTATED RINGERS IV SOLN: INTRAVENOUS | Status: DC

## 2022-03-18 MED ORDER — CLOPIDOGREL BISULFATE 75 MG PO TABS: 75.0000 mg | ORAL_TABLET | Freq: Every day | ORAL | Status: DC

## 2022-03-18 MED ORDER — CYCLOBENZAPRINE HCL 10 MG PO TABS
10.0000 mg | ORAL_TABLET | Freq: Three times a day (TID) | ORAL | Status: DC | PRN
  Administered 2022-03-18 – 2022-03-22 (×7): 10 mg via ORAL
  Filled 2022-03-18 (×7): qty 1

## 2022-03-18 MED ORDER — TRAZODONE HCL 50 MG PO TABS: 25.0000 mg | ORAL_TABLET | Freq: Every evening | ORAL | Status: DC | PRN

## 2022-03-18 MED ORDER — GABAPENTIN 300 MG PO CAPS
300.0000 mg | ORAL_CAPSULE | Freq: Three times a day (TID) | ORAL | Status: DC
  Administered 2022-03-18 – 2022-03-23 (×13): 300 mg via ORAL
  Filled 2022-03-18 (×13): qty 1

## 2022-03-18 MED ORDER — FENTANYL CITRATE PF 50 MCG/ML IJ SOSY: 50.0000 ug | PREFILLED_SYRINGE | Freq: Once | INTRAMUSCULAR | Status: DC

## 2022-03-18 MED ORDER — OXYCODONE HCL 5 MG PO TABS
5.0000 mg | ORAL_TABLET | ORAL | Status: DC | PRN
  Administered 2022-03-18 – 2022-03-23 (×14): 5 mg via ORAL
  Filled 2022-03-18 (×14): qty 1

## 2022-03-18 MED ORDER — ONDANSETRON 4 MG PO TBDP: 4.0000 mg | ORAL_TABLET | Freq: Three times a day (TID) | ORAL | Status: DC | PRN

## 2022-03-18 MED ORDER — ACETAMINOPHEN 650 MG RE SUPP: 650.0000 mg | Freq: Four times a day (QID) | RECTAL | Status: DC | PRN

## 2022-03-18 MED ORDER — SODIUM CHLORIDE 0.9 % IV BOLUS (SEPSIS)
500.0000 mL | Freq: Once | INTRAVENOUS | Status: AC
  Administered 2022-03-18: 500 mL via INTRAVENOUS

## 2022-03-18 MED ORDER — INSULIN GLARGINE-YFGN 100 UNIT/ML ~~LOC~~ SOLN
24.0000 [IU] | Freq: Every day | SUBCUTANEOUS | Status: DC
  Administered 2022-03-18: 24 [IU] via SUBCUTANEOUS
  Filled 2022-03-18 (×2): qty 0.24

## 2022-03-18 MED ORDER — METOPROLOL TARTRATE 50 MG PO TABS
50.0000 mg | ORAL_TABLET | Freq: Two times a day (BID) | ORAL | Status: DC
  Administered 2022-03-18 – 2022-03-23 (×10): 50 mg via ORAL
  Filled 2022-03-18 (×10): qty 1

## 2022-03-18 MED ORDER — ACETAMINOPHEN 325 MG PO TABS
650.0000 mg | ORAL_TABLET | Freq: Four times a day (QID) | ORAL | Status: DC | PRN
  Administered 2022-03-20 (×3): 650 mg via ORAL
  Filled 2022-03-18 (×3): qty 2

## 2022-03-18 MED ORDER — ONDANSETRON HCL 4 MG/2ML IJ SOLN
4.0000 mg | Freq: Once | INTRAMUSCULAR | Status: AC
  Administered 2022-03-18: 4 mg via INTRAVENOUS
  Filled 2022-03-18: qty 2

## 2022-03-18 MED ORDER — VANCOMYCIN HCL 1750 MG/350ML IV SOLN
1750.0000 mg | Freq: Once | INTRAVENOUS | Status: AC
  Administered 2022-03-18: 1750 mg via INTRAVENOUS
  Filled 2022-03-18: qty 350

## 2022-03-18 MED ORDER — VANCOMYCIN HCL IN DEXTROSE 1-5 GM/200ML-% IV SOLN: 1000.0000 mg | Freq: Once | INTRAVENOUS | Status: DC

## 2022-03-18 NOTE — Assessment & Plan Note (Signed)
DM patient s/p right BKA.Was seen a week ago in ED for cellulitis. Sent out on Augmentin. Returns for continued pain and inflammation. Lactic acid elevation to 2.4 down to 1.4 with treatment. Code Sepsis called by ED - small fluid bolus, Abx started - VAnc and Rocephin.  Plan Continue abx pending culture results  Continue IVF

## 2022-03-18 NOTE — Assessment & Plan Note (Signed)
Continue metoprolol. 

## 2022-03-18 NOTE — Assessment & Plan Note (Signed)
Patient had right BKA in May '23 in [place]. He has had an open wound with erythema and pain for many weeks. He was seen Sept 4th and declined admission for IV abx. He was to take augmentin at home. He has had continue pain, increasing erythema, open wound has gotten larger and there is purulent drainage. No leukocytosis present. No indications of osteomyelitis on plain films right knee.  Plan Rocephin 1g IV q 24 for persistent cellulitis having failied augmentin  GS consult - need for debridement/drainage  Pain mgt - fentanyl patch 25 mcg/hr - he has failed Vicodin and oxycodone.

## 2022-03-18 NOTE — Consult Note (Signed)
CODE SEPSIS - PHARMACY COMMUNICATION  **Broad Spectrum Antibiotics should be administered within 1 hour of Sepsis diagnosis**  Time Code Sepsis Called/Page Received: 1131  Antibiotics Ordered: Ceftriaxone and Vancomycin  Time of 1st antibiotic administration: 1201  Additional action taken by pharmacy: N/A  Celene Squibb, PharmD PGY1 Pharmacy Resident 03/18/2022 11:38 AM

## 2022-03-18 NOTE — H&P (Signed)
History and Physical    [name] CHE:[id number] DOB: 05-May-1961 DOA: 03/18/2022  DOS: the patient was seen and examined on 03/18/2022  PCP: Jodi Marble, NP   Patient coming from: Home  I have personally briefly reviewed patient's old medical records in [facility]  Mr. Seelbach, a 61 y/o followed for insulin dependent DM, HT< HLD, HTN has had right BKA. He was recently seen in ED for cellulitis right amputation site. He was sent out on Augmentin. He now returns with persistent infection having failed outpatient treatment.    ED Course: afebrile, 138/86  HR 65  RR 18. Patient not in acute distress. LAb: glucose 335, WBC 7.1 with 62/22/8/7eos, U/A negative, CXR NAD, X-ray right Knee - c/w infection w/o osteomylitis. ED-PA initiated code sepsis: patient received 500 cc bolus and was started on Vanc and Rocephin. TRH called to admit for continue tx.   Review of Systems:  Review of Systems  Constitutional:  Negative for chills, fever and weight loss.  HENT: Negative.    Eyes: Negative.   Respiratory: Negative.    Cardiovascular: Negative.   Gastrointestinal: Negative.   Genitourinary: Negative.   Musculoskeletal:  Positive for myalgias.       Pain at stump  Skin:        Stump red, sore, with drainage  Neurological: Negative.   Endo/Heme/Allergies: Negative.   Psychiatric/Behavioral: Negative.      Past Medical History:  Diagnosis Date   Arthritis    Blood clot in vein    Diabetes mellitus    DVT (deep venous thrombosis) (HCC)    Hypertension    Neuropathy    Type 2 diabetes mellitus with diabetic neuropathy, unspecified (HCC) 02/18/2020    Past Surgical History:  Procedure Laterality Date   AMPUTATION Right 07/01/2021   Procedure: AMPUTATION DIGIT;  Surgeon: [name], DPM;  Location: [facility] ORS;  Service: Podiatry;  Laterality: Right;   APPENDECTOMY     LOWER EXTREMITY ANGIOGRAPHY Right 01/27/2020   Procedure: LOWER EXTREMITY ANGIOGRAPHY;  Surgeon: Annice Needy, MD;  Location: [facility] INVASIVE CV LAB;  Service: Cardiovascular;  Laterality: Right;   VEIN LIGATION AND STRIPPING     of left leg     reports that he has been smoking cigarettes. He has a 6.00 pack-year smoking history. He has never used smokeless tobacco. He reports that he does not currently use alcohol after a past usage of about 3.0 standard drinks of alcohol per week. He reports that he does not use drugs.  Allergies  Allergen Reactions   Erythromycin Base Anxiety   Azithromycin Anxiety   Xanax [Alprazolam] Other (See Comments)    Tachycardia   Benzodiazepines     PT STATES HE CANNOT TAKE THESE BECAUSE THEY ALMOST KILLED HIM   Methocarbamol Anxiety and Other (See Comments)    Stomach cramping, weakness   Sulfa Antibiotics Nausea And Vomiting and Other (See Comments)    Cramping in stomach and hyperventilate.     Family History  Problem Relation Age of Onset   Heart disease Mother    Diabetes Father    Heart disease Father    Diabetes Brother     Prior to Admission medications   Medication Sig Start Date End Date Taking? Authorizing Provider  atorvastatin (LIPITOR) 40 MG tablet Take 1 tablet (40 mg total) by mouth once daily. 08/27/21   Gilles Chiquito, MD  clopidogrel (PLAVIX) 75 MG tablet Take 1 tablet (75 mg total) by mouth daily.  Patient not taking: Reported on 01/20/2021 01/27/20   Annice Needy, MD  gabapentin (NEURONTIN) 300 MG capsule TAKE 1 CAPSULE BY MOUTH 3 TIMES A DAY 01/20/21 01/20/22  Iloabachie, Chioma E, NP  glucose blood (TRUE METRIX BLOOD GLUCOSE TEST) test strip Use as instructed 01/31/17   Quentin Angst, MD  HYDROcodone-acetaminophen (NORCO/VICODIN) 5-325 MG tablet Take 1 tablet by mouth every 4 (four) hours as needed. 10/25/21   Minna Antis, MD  insulin glargine (LANTUS SOLOSTAR) 100 UNIT/ML Solostar Pen Inject 24 Units into the skin at bedtime.    [provider]  metFORMIN (GLUCOPHAGE) 1000 MG tablet TAKE ONE TABLET BY MOUTH 2 TIMES A  DAY WITH A MEAL 12/24/20 12/24/21  Iloabachie, Chioma E, NP  metoprolol tartrate (LOPRESSOR) 50 MG tablet TAKE 1 TABLET BY MOUTH 2 TIMES A DAY 12/24/20 12/24/21  Iloabachie, Chioma E, NP  mupirocin ointment (BACTROBAN) 2 % Apply 1 application topically 2 (two) times daily. 06/02/21   McDonald, Rachelle Hora, DPM  ondansetron (ZOFRAN-ODT) 4 MG disintegrating tablet Take 1 tablet (4 mg total) by mouth every 8 (eight) hours as needed for nausea or vomiting. 10/25/21   Minna Antis, MD  oxyCODONE (ROXICODONE) 5 MG immediate release tablet Take 1 tablet (5 mg total) by mouth every 8 (eight) hours as needed. 03/07/22 03/07/23  Willy Eddy, MD    Physical Exam: Vitals:   03/18/22 1630 03/18/22 1700 03/18/22 1800 03/18/22 1830  BP: 122/68 138/86 (!) 153/89   Pulse: (!) 56 65 75   Resp:  18 16   Temp:    (!) 97.5 F (36.4 C)  TempSrc:    Oral  SpO2: 99% 98% 95%   Weight:      Height:        Physical Exam   Labs on Admission: I have personally reviewed following labs and imaging studies  CBC: Recent Labs  Lab 03/18/22 1027  WBC 7.1  NEUTROABS 4.4  HGB 16.3  HCT 47.5  MCV 94.1  PLT 186   Basic Metabolic Panel: Recent Labs  Lab 03/18/22 1027  NA 134*  K 4.4  CL 100  CO2 27  GLUCOSE 335*  BUN 9  CREATININE 0.75  CALCIUM 9.4   GFR: Estimated Creatinine Clearance: 102 mL/min (by C-G formula based on SCr of 0.75 mg/dL). Liver Function Tests: Recent Labs  Lab 03/18/22 1027  AST 17  ALT 20  ALKPHOS 74  BILITOT 0.7  PROT 6.7  ALBUMIN 3.8   No results for input(s): "LIPASE", "AMYLASE" in the last 168 hours. No results for input(s): "AMMONIA" in the last 168 hours. Coagulation Profile: Recent Labs  Lab 03/18/22 1327  INR 1.0   Cardiac Enzymes: No results for input(s): "CKTOTAL", "CKMB", "CKMBINDEX", "TROPONINI" in the last 168 hours. BNP (last 3 results) No results for input(s): "PROBNP" in the last 8760 hours. HbA1C: No results for input(s): "HGBA1C" in the last 72  hours. CBG: No results for input(s): "GLUCAP" in the last 168 hours. Lipid Profile: No results for input(s): "CHOL", "HDL", "LDLCALC", "TRIG", "CHOLHDL", "LDLDIRECT" in the last 72 hours. Thyroid Function Tests: No results for input(s): "TSH", "T4TOTAL", "FREET4", "T3FREE", "THYROIDAB" in the last 72 hours. Anemia Panel: No results for input(s): "VITAMINB12", "FOLATE", "FERRITIN", "TIBC", "IRON", "RETICCTPCT" in the last 72 hours. Urine analysis:    Component Value Date/Time   COLORURINE STRAW (A) 03/18/2022 1027   APPEARANCEUR CLEAR (A) 03/18/2022 1027   APPEARANCEUR Clear 09/18/2019 1221   LABSPEC 1.008 03/18/2022 1027   LABSPEC  1.015 04/13/2012 1110   PHURINE 5.0 03/18/2022 1027   GLUCOSEU >=500 (A) 03/18/2022 1027   GLUCOSEU Negative 04/13/2012 1110   HGBUR NEGATIVE 03/18/2022 1027   BILIRUBINUR NEGATIVE 03/18/2022 1027   BILIRUBINUR Negative 09/18/2019 1221   BILIRUBINUR Negative 04/13/2012 1110   KETONESUR NEGATIVE 03/18/2022 1027   PROTEINUR NEGATIVE 03/18/2022 1027   NITRITE NEGATIVE 03/18/2022 1027   LEUKOCYTESUR NEGATIVE 03/18/2022 1027   LEUKOCYTESUR Negative 04/13/2012 1110    Radiological Exams on Admission: I have personally reviewed images DG Chest Port 1 View  Result Date: 03/18/2022 CLINICAL DATA:  Possible sepsis.  Right lower extremity cellulitis. EXAM: PORTABLE CHEST 1 VIEW COMPARISON:  09/30/2018 FINDINGS: Lungs are adequately inflated without focal airspace consolidation or effusion. Cardiomediastinal silhouette and remainder of the exam is unchanged. IMPRESSION: No active disease. Electronically Signed   By: Elberta Fortis M.D.   On: 03/18/2022 12:00   DG Knee Complete 4 Views Right  Result Date: 03/18/2022 CLINICAL DATA:  Sepsis/cellulitis.  BKA with pain near stump. EXAM: RIGHT KNEE - COMPLETE 4+ VIEW COMPARISON:  03/07/2022 FINDINGS: Evidence of patient's below-knee amputation. Superficial focal area of low-density within the soft tissues adjacent the  anterior aspect of the tibial stump without significant change. This may represent the site of patient's soft tissue infection no evidence of underlying bone destruction to suggest osteomyelitis. Remainder of the exam is unchanged. IMPRESSION: Superficial area of low-density within the soft tissues adjacent the anterior aspect of the tibial stump without significant change likely the site of patient's soft tissue infection. No evidence of underlying bone destruction to suggest osteomyelitis. Electronically Signed   By: Elberta Fortis M.D.   On: 03/18/2022 12:00    EKG: I have personally reviewed EKG: no EKG done in ED  Assessment/Plan Principal Problem:   Cellulitis of right leg Active Problems:   Sepsis due to cellulitis [facility])   Uncontrolled type 2 diabetes mellitus with hyperglycemia, with long-term current use of insulin (HCC)   Adjustment disorder with mixed anxiety and depressed mood   Essential hypertension    Assessment and Plan: Sepsis due to cellulitis (HCC) DM patient s/p right BKA.Was seen a week ago in ED for cellulitis. Sent out on Augmentin. Returns for continued pain and inflammation. Lactic acid elevation to 2.4 down to 1.4 with treatment. Code Sepsis called by ED - small fluid bolus, Abx started - VAnc and Rocephin.  Plan Continue abx pending culture results  Continue IVF  Adjustment disorder with mixed anxiety and depressed mood Continue home meds  Uncontrolled type 2 diabetes mellitus with hyperglycemia, with long-term current use of insulin (HCC) Last A1C in Epic 06/30/21 10%  Plan  Continue basal insulin  Hold metformin with elevated lactic acid  ss  Essential hypertension BP stable  Plan Continue home meds       DVT prophylaxis: Lovenox Code Status: Full Code Family Communication: could not contact emergency person listed  Disposition Plan: TBD  Consults called: GS will need to consulted for debridement vs further surgical intervention Admission  status: Inpatient, Med-Surg   Illene Regulus, MD [facility] 03/18/2022, 6:37 PM

## 2022-03-18 NOTE — ED Provider Notes (Cosign Needed Addendum)
[facility] Provider Note    Event Date/Time   First MD Initiated Contact with Patient 03/18/22 1117     (approximate)   History   Leg Pain   HPI  [name] is a 61 y.o. male with history of BKA on the right and uncontrolled diabetes presents emergency department complaining of continued right stump pain.  Patient states that he has been on an antibiotic (augmentin).  Was offered admission here on the fourth.  Refused admission and went home.  States now he is ready to get treatment as the pain has become worse and he is feeling worse.  States he has had some muscle spasms also.      Physical Exam   Triage Vital Signs: ED Triage Vitals  Enc Vitals Group     BP 03/18/22 1023 138/85     Pulse Rate 03/18/22 1023 78     Resp 03/18/22 1023 20     Temp 03/18/22 1023 98.5 F (36.9 C)     Temp Source 03/18/22 1023 Oral     SpO2 03/18/22 1023 97 %     Weight 03/18/22 1024 195 lb 15.8 oz (88.9 kg)     Height 03/18/22 1024 5\' 11"  (1.803 m)     Head Circumference --      Peak Flow --      Pain Score 03/18/22 1024 6     Pain Loc --      Pain Edu? --      Excl. in GC? --     Most recent vital signs: Vitals:   03/18/22 1023 03/18/22 1331  BP: 138/85 130/80  Pulse: 78 80  Resp: 20 18  Temp: 98.5 F (36.9 C) 98 F (36.7 C)  SpO2: 97% 98%     General: Awake, no distress.   CV:  Good peripheral perfusion. regular rate and  rhythm Resp:  Normal effort. Lungs CTA Abd:  No distention.   Other:  Right stump is red warm swollen tender, ulcerated area noted to have some drainage   ED Results / Procedures / Treatments   Labs (all labs ordered are listed, but only abnormal results are displayed) Labs Reviewed  LACTIC ACID, PLASMA - Abnormal; Notable for the following components:      Result Value   Lactic Acid, Venous 2.4 (*)    All other components within normal limits  COMPREHENSIVE METABOLIC PANEL - Abnormal; Notable for the following  components:   Sodium 134 (*)    Glucose, Bld 335 (*)    All other components within normal limits  URINALYSIS, COMPLETE (UACMP) WITH MICROSCOPIC - Abnormal; Notable for the following components:   Color, Urine STRAW (*)    APPearance CLEAR (*)    Glucose, UA >=500 (*)    All other components within normal limits  RESP PANEL BY RT-PCR (FLU A&B, COVID) [id number]  CULTURE, BLOOD (ROUTINE X 2)  CULTURE, BLOOD (ROUTINE X 2)  URINE CULTURE  LACTIC ACID, PLASMA  CBC WITH DIFFERENTIAL/PLATELET  PROTIME-INR  APTT     EKG  EKG   RADIOLOGY Chest x-ray, x-ray of the right knee    PROCEDURES:   Procedures   MEDICATIONS ORDERED IN ED: Medications  lactated ringers infusion ( Intravenous New Bag/Given 03/18/22 1247)  vancomycin (VANCOREADY) IVPB 1750 mg/350 mL (1,750 mg Intravenous New Bag/Given 03/18/22 1207)  cefTRIAXone (ROCEPHIN) 2 g in sodium chloride 0.9 % 100 mL IVPB (0 g Intravenous Stopped 03/18/22 1240)  sodium chloride 0.9 %  bolus 500 mL (0 mLs Intravenous Stopped 03/18/22 1240)  ondansetron (ZOFRAN) injection 4 mg (4 mg Intravenous Given 03/18/22 1206)  morphine (PF) 4 MG/ML injection 4 mg (4 mg Intravenous Given 03/18/22 1206)     IMPRESSION / MDM / ASSESSMENT AND PLAN / ED COURSE  I reviewed the triage vital signs and the nursing notes.                              Differential diagnosis includes, but is not limited to, osteomyelitis, cellulitis, sepsis  Patient's presentation is most consistent with acute presentation with potential threat to life or bodily function.   Patient's lactic acid is elevated 2.4.  No other labs were drawn in triage.  Will order sepsis protocols along with antibiotics at this time.  Patient is agreeable to staying and being admitted.   Patient was given normal saline 500 mL bolus along with hip Rocephin 2 g IV, morphine 4 mg IV, Zofran 4 mg IV, and bank IV for cellulitis  Patient's CBC and metabolic panel are reassuring, patient's glucose  is 335 which is higher than the patient's normal trend, COVID test negative, second lactic acid is improved to 1.4 after bolus of normal saline  X-ray of the chest individually reviewed and interpreted by me as being negative for any acute abnormality  X-ray of the right knee does not show any osteomyelitis, this was reviewed and interpreted by me.  Confirmed by radiology  Consult to hospitalist for admission for failed outpatient therapy for cellulitis along with sepsis  ----------------------------------------- 2:22 PM on 03/18/2022 ----------------------------------------- Spoke with Dr. Devonne Doughty, he will be admitting the patient for cellulitis, sepsis and failed outpatient therapy Norins, not Noris  FINAL CLINICAL IMPRESSION(S) / ED DIAGNOSES   Final diagnoses:  Cellulitis of right lower extremity  Acute sepsis (HCC)     Rx / DC Orders   ED Discharge Orders     None        Note:  This document was prepared using Dragon voice recognition software and may include unintentional dictation errors.    Faythe Ghee, PA-C 03/18/22 1422    Arnaldo Natal, MD 03/18/22 1452    Faythe Ghee, PA-C 03/18/22 1809    Arnaldo Natal, MD 03/19/22 [phone]

## 2022-03-18 NOTE — H&P (Signed)
History and Physical    [name] MHD:[id number] DOB: Dec 29, 1960 DOA: 03/18/2022  DOS: the patient was seen and examined on 03/18/2022  PCP: Jodi Marble, NP   Patient coming from: Home  I have personally briefly reviewed patient's old medical records in [facility] Link  Mr. Kortz, a 61 y/o followed for insulin dependent DM, HT< HLD, HTN has had right BKA. He was recently seen in ED for cellulitis right amputation site. He was sent out on Augmentin. He now returns with persistent infection having failed outpatient treatment.    ED Course: afebrile  138/86  HR 65  RR 18. ED-PA exam - erythematous right BKA stump wit purulent drainage. Lab: glucose 335, WBC 7.1 wityh 62/22/8/7, UA negative  Review of Systems:  Review of Systems  Constitutional:  Negative for chills, fever and weight loss.  HENT: Negative.    Eyes: Negative.   Respiratory: Negative.    Cardiovascular: Negative.   Gastrointestinal: Negative.   Genitourinary: Negative.   Musculoskeletal:        Marked right leg pain.  Skin:        Erythema at right BKA stump with pain and drainage  Neurological: Negative.   Endo/Heme/Allergies: Negative.   Psychiatric/Behavioral: Negative.      Past Medical History:  Diagnosis Date   Arthritis    Blood clot in vein    Diabetes mellitus    DVT (deep venous thrombosis) (HCC)    Hypertension    Neuropathy    Type 2 diabetes mellitus with diabetic neuropathy, unspecified (HCC) 02/18/2020    Past Surgical History:  Procedure Laterality Date   AMPUTATION Right 07/01/2021   Procedure: AMPUTATION DIGIT;  Surgeon: [name], DPM;  Location: [facility] ORS;  Service: Podiatry;  Laterality: Right;   APPENDECTOMY     LOWER EXTREMITY ANGIOGRAPHY Right 01/27/2020   Procedure: LOWER EXTREMITY ANGIOGRAPHY;  Surgeon: Annice Needy, MD;  Location: [facility] INVASIVE CV LAB;  Service: Cardiovascular;  Laterality: Right;   VEIN LIGATION AND STRIPPING     of left leg    Soc Hx - married  twice, divorced twice. No children. Living with friends. 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 Now on disabilty. I-ADLs   reports that he has been smoking cigarettes. He has a 6.00 pack-year smoking history. He has never used smokeless tobacco. He reports that he does not currently use alcohol after a past usage of about 3.0 standard drinks of alcohol per week. He reports that he does not use drugs.  Allergies  Allergen Reactions   Erythromycin Base Anxiety   Azithromycin Anxiety   Xanax [Alprazolam] Other (See Comments)    Tachycardia   Benzodiazepines     PT STATES HE CANNOT TAKE THESE BECAUSE THEY ALMOST KILLED HIM   Methocarbamol Anxiety and Other (See Comments)    Stomach cramping, weakness   Sulfa Antibiotics Nausea And Vomiting and Other (See Comments)    Cramping in stomach and hyperventilate.     Family History  Problem Relation Age of Onset   Heart disease Mother    Diabetes Father    Heart disease Father    Diabetes Brother     Prior to Admission medications   Medication Sig Start Date End Date Taking? Authorizing Provider  atorvastatin (LIPITOR) 20 MG tablet Take 20 mg by mouth daily. 02/22/22  Yes [provider]  cyclobenzaprine (FLEXERIL) 10 MG tablet Take 10 mg by mouth 3 (three) times daily as needed for  muscle spasms.   Yes [provider]  gabapentin (NEURONTIN) 300 MG capsule TAKE 1 CAPSULE BY MOUTH 3 TIMES A DAY 01/20/21 03/18/22 Yes Iloabachie, Chioma E, NP  insulin glargine (LANTUS SOLOSTAR) 100 UNIT/ML Solostar Pen Inject 30 Units into the skin at bedtime.   Yes [provider]  metFORMIN (GLUCOPHAGE) 1000 MG tablet TAKE ONE TABLET BY MOUTH 2 TIMES A DAY WITH A MEAL 12/24/20 03/18/22 Yes Iloabachie, Chioma E, NP  metoprolol tartrate (LOPRESSOR) 50 MG tablet TAKE 1 TABLET BY MOUTH 2 TIMES A DAY 12/24/20 03/18/22 Yes Iloabachie, Chioma E, NP  atorvastatin (LIPITOR) 40 MG tablet Take 1 tablet (40 mg total)  by mouth once daily. Patient not taking: Reported on 03/18/2022 08/27/21   Gilles Chiquito, MD  clopidogrel (PLAVIX) 75 MG tablet Take 1 tablet (75 mg total) by mouth daily. Patient not taking: Reported on 01/20/2021 01/27/20   Annice Needy, MD  glucose blood (TRUE METRIX BLOOD GLUCOSE TEST) test strip Use as instructed 01/31/17   Quentin Angst, MD  HYDROcodone-acetaminophen (NORCO/VICODIN) 5-325 MG tablet Take 1 tablet by mouth every 4 (four) hours as needed. Patient not taking: Reported on 03/18/2022 10/25/21   Minna Antis, MD  mupirocin ointment (BACTROBAN) 2 % Apply 1 application topically 2 (two) times daily. 06/02/21   McDonald, Rachelle Hora, DPM  ondansetron (ZOFRAN-ODT) 4 MG disintegrating tablet Take 1 tablet (4 mg total) by mouth every 8 (eight) hours as needed for nausea or vomiting. Patient not taking: Reported on 03/18/2022 10/25/21   Minna Antis, MD  oxyCODONE (ROXICODONE) 5 MG immediate release tablet Take 1 tablet (5 mg total) by mouth every 8 (eight) hours as needed. Patient not taking: Reported on 03/18/2022 03/07/22 03/07/23  Willy Eddy, MD    Physical Exam: Vitals:   03/18/22 1630 03/18/22 1700 03/18/22 1800 03/18/22 1830  BP: 122/68 138/86 (!) 153/89   Pulse: (!) 56 65 75   Resp:  18 16   Temp:    (!) 97.5 F (36.4 C)  TempSrc:    Oral  SpO2: 99% 98% 95%   Weight:      Height:        Physical Exam Vitals and nursing note reviewed.  Constitutional:      Appearance: Normal appearance. He is normal weight.  HENT:     Head: Normocephalic and atraumatic.     Mouth/Throat:     Mouth: Mucous membranes are moist.  Eyes:     Extraocular Movements: Extraocular movements intact.     Conjunctiva/sclera: Conjunctivae normal.     Pupils: Pupils are equal, round, and reactive to light.  Neck:     Vascular: No carotid bruit.  Cardiovascular:     Rate and Rhythm: Normal rate and regular rhythm.     Comments: 2+ radial pulses, 1+ left DP pulse Pulmonary:      Effort: Pulmonary effort is normal.     Breath sounds: Normal breath sounds. No wheezing or rales.  Abdominal:     General: Bowel sounds are normal.     Palpations: Abdomen is soft.     Tenderness: There is no abdominal tenderness. There is no guarding.     Comments: No inquinal adenopathy right groin  Musculoskeletal:     Cervical back: Normal range of motion and neck supple.     Comments: Right BKA.   Skin:    Comments: Right BKA stump with erythema extending 6-8 cm but no above the knee. Very tender to touch, minimal fluctuance.  Open wound 5 cm in diameter with yellow granulation tissue, scant purulent drainage.   Neurological:     General: No focal deficit present.     Mental Status: He is alert and oriented to person, place, and time.  Psychiatric:        Mood and Affect: Mood normal.        Behavior: Behavior normal.      Labs on Admission: I have personally reviewed following labs and imaging studies  CBC: Recent Labs  Lab 03/18/22 1027  WBC 7.1  NEUTROABS 4.4  HGB 16.3  HCT 47.5  MCV 94.1  PLT 186   Basic Metabolic Panel: Recent Labs  Lab 03/18/22 1027  NA 134*  K 4.4  CL 100  CO2 27  GLUCOSE 335*  BUN 9  CREATININE 0.75  CALCIUM 9.4   GFR: Estimated Creatinine Clearance: 102 mL/min (by C-G formula based on SCr of 0.75 mg/dL). Liver Function Tests: Recent Labs  Lab 03/18/22 1027  AST 17  ALT 20  ALKPHOS 74  BILITOT 0.7  PROT 6.7  ALBUMIN 3.8   No results for input(s): "LIPASE", "AMYLASE" in the last 168 hours. No results for input(s): "AMMONIA" in the last 168 hours. Coagulation Profile: Recent Labs  Lab 03/18/22 1327  INR 1.0   Cardiac Enzymes: No results for input(s): "CKTOTAL", "CKMB", "CKMBINDEX", "TROPONINI" in the last 168 hours. BNP (last 3 results) No results for input(s): "PROBNP" in the last 8760 hours. HbA1C: No results for input(s): "HGBA1C" in the last 72 hours. CBG: No results for input(s): "GLUCAP" in the last 168  hours. Lipid Profile: No results for input(s): "CHOL", "HDL", "LDLCALC", "TRIG", "CHOLHDL", "LDLDIRECT" in the last 72 hours. Thyroid Function Tests: No results for input(s): "TSH", "T4TOTAL", "FREET4", "T3FREE", "THYROIDAB" in the last 72 hours. Anemia Panel: No results for input(s): "VITAMINB12", "FOLATE", "FERRITIN", "TIBC", "IRON", "RETICCTPCT" in the last 72 hours. Urine analysis:    Component Value Date/Time   COLORURINE STRAW (A) 03/18/2022 1027   APPEARANCEUR CLEAR (A) 03/18/2022 1027   APPEARANCEUR Clear 09/18/2019 1221   LABSPEC 1.008 03/18/2022 1027   LABSPEC 1.015 04/13/2012 1110   PHURINE 5.0 03/18/2022 1027   GLUCOSEU >=500 (A) 03/18/2022 1027   GLUCOSEU Negative 04/13/2012 1110   HGBUR NEGATIVE 03/18/2022 1027   BILIRUBINUR NEGATIVE 03/18/2022 1027   BILIRUBINUR Negative 09/18/2019 1221   BILIRUBINUR Negative 04/13/2012 1110   KETONESUR NEGATIVE 03/18/2022 1027   PROTEINUR NEGATIVE 03/18/2022 1027   NITRITE NEGATIVE 03/18/2022 1027   LEUKOCYTESUR NEGATIVE 03/18/2022 1027   LEUKOCYTESUR Negative 04/13/2012 1110    Radiological Exams on Admission: I have personally reviewed images DG Chest Port 1 View  Result Date: 03/18/2022 CLINICAL DATA:  Possible sepsis.  Right lower extremity cellulitis. EXAM: PORTABLE CHEST 1 VIEW COMPARISON:  09/30/2018 FINDINGS: Lungs are adequately inflated without focal airspace consolidation or effusion. Cardiomediastinal silhouette and remainder of the exam is unchanged. IMPRESSION: No active disease. Electronically Signed   By: Elberta Fortis M.D.   On: 03/18/2022 12:00   DG Knee Complete 4 Views Right  Result Date: 03/18/2022 CLINICAL DATA:  Sepsis/cellulitis.  BKA with pain near stump. EXAM: RIGHT KNEE - COMPLETE 4+ VIEW COMPARISON:  03/07/2022 FINDINGS: Evidence of patient's below-knee amputation. Superficial focal area of low-density within the soft tissues adjacent the anterior aspect of the tibial stump without significant change.  This may represent the site of patient's soft tissue infection no evidence of underlying bone destruction to suggest osteomyelitis. Remainder of the exam is unchanged.  IMPRESSION: Superficial area of low-density within the soft tissues adjacent the anterior aspect of the tibial stump without significant change likely the site of patient's soft tissue infection. No evidence of underlying bone destruction to suggest osteomyelitis. Electronically Signed   By: Elberta Fortis M.D.   On: 03/18/2022 12:00    EKG: I have personally reviewed EKG: no EKG on chart this admission  Assessment/Plan Principal Problem:   Cellulitis of right leg Active Problems:   Sepsis due to cellulitis [facility])   Uncontrolled type 2 diabetes mellitus with hyperglycemia, with long-term current use of insulin (HCC)   Adjustment disorder with mixed anxiety and depressed mood   Essential hypertension    Assessment and Plan: * Cellulitis of right leg Patient had right BKA in May '23 in [place]. He has had an open wound with erythema and pain for many weeks. He was seen Sept 4th and declined admission for IV abx. He was to take augmentin at home. He has had continue pain, increasing erythema, open wound has gotten larger and there is purulent drainage. No leukocytosis present. No indications of osteomyelitis on plain films right knee.  Plan Rocephin 1g IV q 24 for persistent cellulitis having failied augmentin  GS consult - need for debridement/drainage  Pain mgt - fentanyl patch 25 mcg/hr - he has failed Vicodin and oxycodone.  Sepsis due to cellulitis [facility]) DM patient s/p right BKA.Was seen a week ago in ED for cellulitis. Sent out on Augmentin. Returns for continued pain and inflammation. Lactic acid elevation to 2.4 down to 1.4 with treatment. Code Sepsis called by ED - small fluid bolus, Abx started - VAnc and Rocephin.  Plan Continue abx pending culture results  Continue IVF  Adjustment disorder with mixed anxiety and  depressed mood Per friend patient has been fearful about treatment and being hospitalized. He has no psych meds on his med list.  Plan Zolft 50 mg daily for anxiety  Uncontrolled type 2 diabetes mellitus with hyperglycemia, with long-term current use of insulin (HCC) Last A1C in Epic 06/30/21 10%  Plan  Continue basal insulin  Hold metformin with elevated lactic acid  ss  Essential hypertension BP stable  Plan Continue home meds       DVT prophylaxis: Lovenox Code Status: Full Code Family Communication: spoke with Ginette Pitman, friend. explained  Disposition Plan: home when stable  Consults called: GS - secure chat message to Dr. Claudine Mouton on call for GS  Admission status: Inpatient, Med-Surg   Illene Regulus, MD [facility] 03/18/2022, 7:28 PM

## 2022-03-18 NOTE — Consult Note (Signed)
PHARMACY -  BRIEF ANTIBIOTIC NOTE   Pharmacy has received consult(s) for Vancomycin from an ED provider.  The patient's profile has been reviewed for ht/wt/allergies/indication/available labs.    One time order(s) placed for Vancomycin 1750 mg IV x1  Further antibiotics/pharmacy consults should be ordered by admitting physician if indicated.                       Thank you,  Celene Squibb, PharmD PGY1 Pharmacy Resident 03/18/2022 11:38 AM

## 2022-03-18 NOTE — Subjective & Objective (Signed)
[name], a 61 y/o followed for insulin dependent DM, HT< HLD, HTN has had right BKA. He was recently seen in ED for cellulitis right amputation site. He was sent out on Augmentin. He now returns with persistent infection having failed outpatient treatment.

## 2022-03-18 NOTE — Assessment & Plan Note (Deleted)
Last A1C in Epic 06/30/21 10%  Plan  Continue basal insulin  Hold metformin with elevated lactic acid  ss

## 2022-03-18 NOTE — ED Triage Notes (Addendum)
Pt here with right leg pain. Pt has a right BKA and is having pain near the stump. Pt was here recently for cellulitis and offered admission but refused.

## 2022-03-18 NOTE — Assessment & Plan Note (Addendum)
Zolft 50 mg daily for anxiety

## 2022-03-18 NOTE — Sepsis Progress Note (Signed)
eLink is following this Code Sepsis. °

## 2022-03-19 ENCOUNTER — Inpatient Hospital Stay: Payer: Medicaid Other

## 2022-03-19 DIAGNOSIS — L03115 Cellulitis of right lower limb: Secondary | ICD-10-CM | POA: Diagnosis not present

## 2022-03-19 DIAGNOSIS — E785 Hyperlipidemia, unspecified: Secondary | ICD-10-CM

## 2022-03-19 DIAGNOSIS — E1165 Type 2 diabetes mellitus with hyperglycemia: Secondary | ICD-10-CM

## 2022-03-19 DIAGNOSIS — M86161 Other acute osteomyelitis, right tibia and fibula: Secondary | ICD-10-CM

## 2022-03-19 DIAGNOSIS — I10 Essential (primary) hypertension: Secondary | ICD-10-CM

## 2022-03-19 DIAGNOSIS — E871 Hypo-osmolality and hyponatremia: Secondary | ICD-10-CM

## 2022-03-19 DIAGNOSIS — L02415 Cutaneous abscess of right lower limb: Secondary | ICD-10-CM

## 2022-03-19 DIAGNOSIS — I739 Peripheral vascular disease, unspecified: Secondary | ICD-10-CM

## 2022-03-19 DIAGNOSIS — F4323 Adjustment disorder with mixed anxiety and depressed mood: Secondary | ICD-10-CM

## 2022-03-19 DIAGNOSIS — M869 Osteomyelitis, unspecified: Secondary | ICD-10-CM

## 2022-03-19 LAB — URINE CULTURE: Culture: NO GROWTH

## 2022-03-19 LAB — GLUCOSE, CAPILLARY
Glucose-Capillary: 181 mg/dL — ABNORMAL HIGH (ref 70–99)
Glucose-Capillary: 299 mg/dL — ABNORMAL HIGH (ref 70–99)

## 2022-03-19 MED ORDER — INSULIN ASPART 100 UNIT/ML IJ SOLN
0.0000 [IU] | Freq: Every day | INTRAMUSCULAR | Status: DC
  Administered 2022-03-19: 3 [IU] via SUBCUTANEOUS
  Administered 2022-03-21 – 2022-03-22 (×2): 2 [IU] via SUBCUTANEOUS
  Filled 2022-03-19 (×3): qty 1

## 2022-03-19 MED ORDER — METRONIDAZOLE 500 MG/100ML IV SOLN
500.0000 mg | Freq: Two times a day (BID) | INTRAVENOUS | Status: DC
  Administered 2022-03-19 – 2022-03-22 (×6): 500 mg via INTRAVENOUS
  Filled 2022-03-19 (×6): qty 100

## 2022-03-19 MED ORDER — VANCOMYCIN HCL 1750 MG/350ML IV SOLN
1750.0000 mg | Freq: Once | INTRAVENOUS | Status: AC
  Administered 2022-03-19: 1750 mg via INTRAVENOUS
  Filled 2022-03-19: qty 350

## 2022-03-19 MED ORDER — POLYETHYLENE GLYCOL 3350 17 G PO PACK
17.0000 g | PACK | Freq: Every day | ORAL | Status: DC
  Administered 2022-03-19 – 2022-03-22 (×3): 17 g via ORAL
  Filled 2022-03-19 (×3): qty 1

## 2022-03-19 MED ORDER — INSULIN ASPART 100 UNIT/ML IJ SOLN
0.0000 [IU] | Freq: Three times a day (TID) | INTRAMUSCULAR | Status: DC
  Administered 2022-03-19: 2 [IU] via SUBCUTANEOUS
  Filled 2022-03-19: qty 1

## 2022-03-19 MED ORDER — INSULIN GLARGINE-YFGN 100 UNIT/ML ~~LOC~~ SOLN
18.0000 [IU] | Freq: Every day | SUBCUTANEOUS | Status: DC
  Administered 2022-03-19: 18 [IU] via SUBCUTANEOUS
  Filled 2022-03-19 (×2): qty 0.18

## 2022-03-19 MED ORDER — VANCOMYCIN HCL 1500 MG/300ML IV SOLN
1500.0000 mg | Freq: Two times a day (BID) | INTRAVENOUS | Status: DC
  Administered 2022-03-19 – 2022-03-22 (×6): 1500 mg via INTRAVENOUS
  Filled 2022-03-19 (×6): qty 300

## 2022-03-19 NOTE — Plan of Care (Signed)
  Problem: Education: Goal: Knowledge of General Education information will improve Description: Including pain rating scale, medication(s)/side effects and non-pharmacologic comfort measures Outcome: Progressing   Problem: Activity: Goal: Risk for activity intolerance will decrease Outcome: Progressing   Problem: Nutrition: Goal: Adequate nutrition will be maintained Outcome: Progressing   Problem: Coping: Goal: Level of anxiety will decrease Outcome: Progressing   Problem: Elimination: Goal: Will not experience complications related to bowel motility Outcome: Progressing Goal: Will not experience complications related to urinary retention Outcome: Progressing   Problem: Pain Managment: Goal: General experience of comfort will improve Outcome: Progressing   

## 2022-03-19 NOTE — Assessment & Plan Note (Addendum)
Patient was not taking Plavix at home.  Will restart aspirin tomorrow.

## 2022-03-19 NOTE — Progress Notes (Signed)
  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 Case discussed with vascular surgery to see patient and discuss options.  We will make n.p.o. after midnight.  Continue IV antibiotics.  Sepsis ruled out.  Cellulitis and abscess of right leg MRI showing deep abscess.  Patient has a nonhealing wound going on for months.  Continue IV antibiotics.  Uncontrolled type 2 diabetes mellitus with hyperglycemia, without long-term current use of insulin (HCC) Hemoglobin A1c elevated at 8.1.  With patient being n.p.o. after midnight I will decrease Lantus insulin to 18 units at night  Essential hypertension Continue metoprolol  Adjustment disorder with mixed anxiety and depressed mood Zolft 50 mg daily for anxiety  Hyponatremia Sodium only one-point lower than the normal range  Peripheral vascular disease (HCC) Holding Plavix  Hyperlipidemia Restart Lipitor        Subjective: Patient states he has been dealing with a nonhealing ulcer on his stump for months.  Following up at the wound care center.  Recently became red and more painful.  Admitted with cellulitis and started on antibiotics.  Physical Exam: Vitals:   03/18/22 1800 03/18/22 1830 03/18/22 2026 03/19/22 0731  BP: (!) 153/89  123/85 (!) 156/97  Pulse: 75  87 62  Resp: 16  16   Temp:  (!) 97.5 F (36.4 C) 97.8 F (36.6 C) (!) 97.5 F (36.4 C)  TempSrc:  Oral Oral   SpO2: 95%  99% 99%  Weight:      Height:       Physical Exam HENT:     Head: Normocephalic.     Mouth/Throat:     Pharynx: No oropharyngeal exudate.  Eyes:     General: Lids are normal.     Conjunctiva/sclera: Conjunctivae normal.  Cardiovascular:     Rate and Rhythm: Normal rate and regular rhythm.     Heart sounds: Normal heart sounds, S1 normal and S2  normal.  Pulmonary:     Breath sounds: No decreased breath sounds, wheezing, rhonchi or rales.  Abdominal:     Palpations: Abdomen is soft.     Tenderness: There is no abdominal tenderness.  Musculoskeletal:     Right lower leg: No swelling.     Left lower leg: No swelling.  Skin:    Comments: See picture below  Neurological:     Mental Status: He is alert and oriented to person, place, and time.      Data Reviewed: MRI leg right: Showing tibial stump concerning for osteomyelitis and deep 5.6 cm abscess adjacent to the residual tibia which may communicate to the skin surface anteriorly. Sodium 134, lactic acid 2.4 initially, CBC normal range  Family Communication: Declined  Disposition: Status is: Inpatient Remains inpatient appropriate because: Requiring IV antibiotics for osteomyelitis and abscess.  Planned Discharge Destination: Home    Time spent: 28 minutes  Author: Loletha Grayer, MD 03/19/2022 2:37 PM  For on call review [email or web address].

## 2022-03-19 NOTE — Assessment & Plan Note (Addendum)
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 Case discussed with vascular surgery to see patient and discuss options.  We will make n.p.o. after midnight.  Continue IV antibiotics.  Sepsis ruled out.

## 2022-03-19 NOTE — Consult Note (Addendum)
[facility] Vascular Consult Note  MRN : [id number]  [name] is a 61 y.o. (05-13-1961) male who presents with chief complaint of  Chief Complaint  Patient presents with   Leg Pain  .  History of Present Illness: Patient known to service- h/o DM, HTN, HLD, +tobacco use. Prior RIGHT lower extremity arterial interventions- Fem-PT, angiograms, stenting. Subsequent toe amputation. Patient lost to follow up- poor compliance. [facility], since then stump infections with intermittent ABX therapy. [facility] and left prior to admission and care. Presents with progressively worsening RIGHT stump redness, pain and drainage. Notes RIGHT knee pain and progressive phantom pain. Patient states he has been unable to straighten his RIGHT knee since the BKA and is wheelchair bound.   Current Facility-Administered Medications  Medication Dose Route Frequency Provider Last Rate Last Admin   acetaminophen (TYLENOL) tablet 650 mg  650 mg Oral Q6H PRN Norins, Rosalyn GessMichael E, MD       Or   acetaminophen (TYLENOL) suppository 650 mg  650 mg Rectal Q6H PRN Norins, Rosalyn GessMichael E, MD       atorvastatin (LIPITOR) tablet 20 mg  20 mg Oral Daily Norins, Rosalyn GessMichael E, MD   20 mg at 03/18/22 2116   cefTRIAXone (ROCEPHIN) 2 g in sodium chloride 0.9 % 100 mL IVPB  2 g Intravenous Daily Norins, Rosalyn GessMichael E, MD       cyclobenzaprine (FLEXERIL) tablet 10 mg  10 mg Oral TID PRN Jacques NavyNorins, Michael E, MD   10 mg at 03/19/22 0949   enoxaparin (LOVENOX) injection 40 mg  40 mg Subcutaneous Q24H Norins, Rosalyn GessMichael E, MD   40 mg at 03/18/22 2128   fentaNYL (DURAGESIC) 12 MCG/HR 1 patch  1 patch Transdermal Q72H Norins, Rosalyn GessMichael E, MD   1 patch at 03/18/22 1948   gabapentin (NEURONTIN) capsule 300 mg  300 mg Oral TID Jacques NavyNorins, Michael E, MD   300 mg at 03/19/22 1626   insulin aspart (novoLOG) injection 0-5 Units  0-5 Units Subcutaneous QHS Wieting, Richard, MD       insulin  aspart (novoLOG) injection 0-9 Units  0-9 Units Subcutaneous TID WC Alford HighlandWieting, Richard, MD   2 Units at 03/19/22 1641   insulin glargine-yfgn (SEMGLEE) injection 18 Units  18 Units Subcutaneous QHS Wieting, Richard, MD       metoprolol tartrate (LOPRESSOR) tablet 50 mg  50 mg Oral BID Jacques NavyNorins, Michael E, MD   50 mg at 03/19/22 0949   metroNIDAZOLE (FLAGYL) IVPB 500 mg  500 mg Intravenous Q12H Alford HighlandWieting, Richard, MD 100 mL/hr at 03/19/22 1625 500 mg at 03/19/22 1625   ondansetron (ZOFRAN-ODT) disintegrating tablet 4 mg  4 mg Oral Q8H PRN Norins, Rosalyn GessMichael E, MD       oxyCODONE (Oxy IR/ROXICODONE) immediate release tablet 5 mg  5 mg Oral Q4H PRN Norins, Rosalyn GessMichael E, MD   5 mg at 03/19/22 1325   polyethylene glycol (MIRALAX / GLYCOLAX) packet 17 g  17 g Oral Daily Alford HighlandWieting, Richard, MD   17 g at 03/19/22 1619   traZODone (DESYREL) tablet 25 mg  25 mg Oral QHS PRN Norins, Rosalyn GessMichael E, MD       vancomycin (VANCOREADY) IVPB 1500 mg/300 mL  1,500 mg Intravenous Q12H Tressie Ellishappell, Alex B, [facility]        Past Medical History:  Diagnosis Date   Arthritis    Blood clot in vein    Diabetes mellitus  DVT (deep venous thrombosis) ([place])    Hypertension    Neuropathy    Type 2 diabetes mellitus with diabetic neuropathy, unspecified ([place]) 02/18/2020    Past Surgical History:  Procedure Laterality Date   AMPUTATION Right 07/01/2021   Procedure: AMPUTATION DIGIT;  Surgeon: Felipa Furnace, DPM;  Location: [facility] ORS;  Service: Podiatry;  Laterality: Right;   APPENDECTOMY     LOWER EXTREMITY ANGIOGRAPHY Right 01/27/2020   Procedure: LOWER EXTREMITY ANGIOGRAPHY;  Surgeon: Algernon Huxley, MD;  Location: [facility];  Service: Cardiovascular;  Laterality: Right;   VEIN LIGATION AND STRIPPING     of left leg    Social History Social History   Tobacco Use   Smoking status: Every Day    Packs/day: 1.00    Years: 6.00    Total pack years: 6.00    Types: Cigarettes   Smokeless tobacco: Never   Tobacco comments:     1.5-2  Vaping Use   Vaping Use: Never used  Substance Use Topics   Alcohol use: Not Currently    Alcohol/week: 3.0 standard drinks of alcohol    Types: 3 Cans of beer per week    Comment: 2 times a month (1 24oz beer)   Drug use: No    Family History Family History  Problem Relation Age of Onset   Heart disease Mother    Diabetes Father    Heart disease Father    Diabetes Brother     Allergies  Allergen Reactions   Erythromycin Base Anxiety   Azithromycin Anxiety   Xanax [Alprazolam] Other (See Comments)    Tachycardia   Benzodiazepines     PT STATES HE CANNOT TAKE THESE BECAUSE THEY ALMOST KILLED HIM   Methocarbamol Anxiety and Other (See Comments)    Stomach cramping, weakness   Sulfa Antibiotics Nausea And Vomiting and Other (See Comments)    Cramping in stomach and hyperventilate.      REVIEW OF SYSTEMS (Negative unless checked)  Constitutional: [] Weight loss  Fever  [] Chills Cardiac: [] Chest pain   [] Chest pressure   [] Palpitations   [] Shortness of breath when laying flat   [] Shortness of breath at rest   [] Shortness of breath with exertion. Vascular:  [] Pain in legs with walking   [x] Pain in legs at rest   [] Pain in legs when laying flat   [] Claudication   [] Pain in feet when walking  [] Pain in feet at rest  [] Pain in feet when laying flat   [] History of DVT   [] Phlebitis   [] Swelling in legs   [] Varicose veins   [x] Non-healing ulcers Pulmonary:   [] Uses home oxygen   [] Productive cough   [] Hemoptysis   [] Wheeze  [] COPD   [] Asthma Neurologic:  [] Dizziness  [] Blackouts   [] Seizures   [] History of stroke   [] History of TIA  [] Aphasia   [] Temporary blindness   [] Dysphagia   [] Weakness or numbness in arms   [] Weakness or numbness in legs Musculoskeletal:  [x] Arthritis   [x] Joint swelling   [] Joint pain   [] Low back pain Hematologic:  [] Easy bruising  [] Easy bleeding   [] Hypercoagulable state   [] Anemic  [] Hepatitis Gastrointestinal:  [] Blood in stool   [] Vomiting blood   [] Gastroesophageal reflux/heartburn   [] Difficulty swallowing. Genitourinary:  [] Chronic kidney disease   [] Difficult urination  [] Frequent urination  [] Burning with urination   [] Blood in urine Skin:  [] Rashes   [] Ulcers   [x] Wounds Psychological:  [] History of anxiety   []  History of major  depression.  Physical Examination  Vitals:   03/18/22 1830 03/18/22 2026 03/19/22 0731 03/19/22 1519  BP:  123/85 (!) 156/97 (!) 147/82  Pulse:  87 62 66  Resp:  16    Temp: (!) 97.5 F (36.4 C) 97.8 F (36.6 C) (!) 97.5 F (36.4 C) 98.2 F (36.8 C)  TempSrc: Oral Oral    SpO2:  99% 99% 99%  Weight:      Height:       Body mass index is 27.33 kg/m. Gen:  WD/WN, NAD Head: [place]/AT, No temporalis wasting. Prominent temp pulse not noted. Ear/Nose/Throat: Hearing grossly intact, nares w/o erythema or drainage, oropharynx w/o Erythema/Exudate Eyes: Sclera non-icteric, conjunctiva clear Neck: Trachea midline.  No JVD.  Pulmonary:  Good air movement, respirations not labored, equal bilaterally.  Cardiac: RRR, normal S1, S2. Vascular:  Vessel Right Left  Radial Palpable Palpable  Ulnar    Brachial    Carotid Palpable, without bruit Palpable, without bruit  Aorta Not palpable N/A  Femoral Faint Palpable Palpable  Popliteal    PT    DP  warm   Gastrointestinal: soft, non-tender/non-distended. No guarding/reflex.  Musculoskeletal: RIGHT BKA stump- gangrene of mid incision- scant purulent drainage, erythema, edema, tender to just proximal to knee. Left lower extremity- 2+edema Neurologic: Sensation grossly intact in extremities.  Symmetrical.  Speech is fluent. Motor exam as listed above. Psychiatric: Judgment intact, Mood & affect appropriate for pt's clinical situation.      CBC Lab Results  Component Value Date   WBC 7.1 03/18/2022   HGB 16.3 03/18/2022   HCT 47.5 03/18/2022   MCV 94.1 03/18/2022   PLT 186 03/18/2022    BMET    Component Value Date/Time   NA 134 (L) 03/18/2022  1027   NA 138 09/18/2019 1221   NA 135 (L) 08/14/2013 1710   K 4.4 03/18/2022 1027   K 3.8 08/14/2013 1710   CL 100 03/18/2022 1027   CL 103 08/14/2013 1710   CO2 27 03/18/2022 1027   CO2 32 08/14/2013 1710   GLUCOSE 335 (H) 03/18/2022 1027   GLUCOSE 116 (H) 08/14/2013 1710   BUN 9 03/18/2022 1027   BUN 12 09/18/2019 1221   BUN 11 08/14/2013 1710   CREATININE 0.75 03/18/2022 1027   CREATININE 0.82 01/21/2016 1523   CALCIUM 9.4 03/18/2022 1027   CALCIUM 9.3 08/14/2013 1710   GFRNONAA >60 03/18/2022 1027   GFRNONAA >60 08/14/2013 1710   GFRNONAA >89 05/24/2013 1211   GFRAA >60 01/27/2020 1151   GFRAA >60 08/14/2013 1710   GFRAA >89 05/24/2013 1211   Estimated Creatinine Clearance: 102 mL/min (by C-G formula based on SCr of 0.75 mg/dL).  COAG Lab Results  Component Value Date   INR 1.0 03/18/2022   INR 0.9 03/07/2022   INR 0.9 11/09/2020    Radiology MR TIBIA FIBULA RIGHT WO CONTRAST  Result Date: 03/19/2022 CLINICAL DATA:  Pain and swelling and right BKA stump. EXAM: MRI OF LOWER RIGHT EXTREMITY WITHOUT CONTRAST TECHNIQUE: Multiplanar, multisequence MR imaging of the right lower leg was performed. No intravenous contrast was administered. COMPARISON:  Right knee x-rays from yesterday. FINDINGS: Limited study. Only 3 sequences were obtained as the patient declined further examination due to pain. Bones/Joint/Cartilage Prior right below-knee amputation. Mild marrow edema in the residual tibia at the amputation site (series 5, image 23), which closely approximates the skin surface anteriorly (series 6, image 38). Degenerative changes of both knees. Muscles and Tendons Confluent edema within the residual  muscles of the right lower leg, most consistent with denervation. Soft tissue Diffuse soft tissue swelling of the amputation stump and left lower leg. Deep 5.6 cm fluid collection within the amputation stump adjacent to the residual tibia (series 5, image 25), containing tiny foci of  air. This may communicate with the skin surface anteriorly. IMPRESSION: 1. Prior right below-knee amputation with mild marrow edema in the tibial stump, which closely approximates the skin surface anteriorly. This is concerning for osteomyelitis. 2. Deep 5.6 cm abscess within the amputation stump adjacent to the residual tibia, which may also communicate with the skin surface anteriorly. Electronically Signed   By: Obie Dredge M.D.   On: 03/19/2022 12:35   DG Chest Port 1 View  Result Date: 03/18/2022 CLINICAL DATA:  Possible sepsis.  Right lower extremity cellulitis. EXAM: PORTABLE CHEST 1 VIEW COMPARISON:  09/30/2018 FINDINGS: Lungs are adequately inflated without focal airspace consolidation or effusion. Cardiomediastinal silhouette and remainder of the exam is unchanged. IMPRESSION: No active disease. Electronically Signed   By: Elberta Fortis M.D.   On: 03/18/2022 12:00   DG Knee Complete 4 Views Right  Result Date: 03/18/2022 CLINICAL DATA:  Sepsis/cellulitis.  BKA with pain near stump. EXAM: RIGHT KNEE - COMPLETE 4+ VIEW COMPARISON:  03/07/2022 FINDINGS: Evidence of patient's below-knee amputation. Superficial focal area of low-density within the soft tissues adjacent the anterior aspect of the tibial stump without significant change. This may represent the site of patient's soft tissue infection no evidence of underlying bone destruction to suggest osteomyelitis. Remainder of the exam is unchanged. IMPRESSION: Superficial area of low-density within the soft tissues adjacent the anterior aspect of the tibial stump without significant change likely the site of patient's soft tissue infection. No evidence of underlying bone destruction to suggest osteomyelitis. Electronically Signed   By: Elberta Fortis M.D.   On: 03/18/2022 12:00   DG Knee Complete 4 Views Right  Result Date: 03/07/2022 CLINICAL DATA:  Right amputation stump with open wound evaluate for osteomyelitis EXAM: RIGHT KNEE - COMPLETE 4+  VIEW COMPARISON:  None Available. FINDINGS: Status post right below-knee amputation. No evidence of bony erosion or sclerosis of the stump. Soft tissue wound and edema. Popliteal artery stent. Mild tricompartmental arthrosis. IMPRESSION: Status post right below-knee amputation. No evidence of bony erosion or sclerosis of the stump. Soft tissue wound and edema. Contrast enhanced MRI is the most sensitive test for the evaluation of known or suspected osteomyelitis. Electronically Signed   By: Jearld Lesch M.D.   On: 03/07/2022 17:06      Assessment/Plan 1. RIGHT BKA Osteomyelitis, gangrene and abscess with knee contracture and cellulitis Extensive discussion with patient regarding current state-Explained extensive chronic RIGHT BKA infection now with osteomyelitis, large abscess with significant knee contracture. Discussed options of extensive debridement with partial tibia resection that wound require a wound VAC and open progressive healing or Above knee amputation given his significant knee contracture and evidence of possible infection in knee/soft tissues. Several questions answered. Understanding expressed. Patient wishes to proceed with AKA. Explained risks benefits and alternatives - including but not limited to- infection, phantom pain, bleeding, further procedures- including additional limb resection.  If patient has issues with nonhealing of AKA stump in the distant future may require Right CFA endarterectomy- currently leg is perfused by collaterals. NPO after MN Will plan for AKA tomorrow morning    Bertram Denver, MD  03/19/2022 5:15 PM    This note was created with Dragon medical transcription system.  Any  error is purely unintentional

## 2022-03-19 NOTE — Assessment & Plan Note (Signed)
Sodium only one-point lower than the normal range

## 2022-03-19 NOTE — Assessment & Plan Note (Signed)
Re-start Lipitor 

## 2022-03-19 NOTE — Consult Note (Signed)
Pharmacy Antibiotic Note  [name] is a 61 y.o. male with PMH including IDDM, HTN, HLD, Hx right BKA admitted on 03/18/2022 with  cellulitis of right BKA amputation site .  Pharmacy has been consulted for vancomycin dosing. Patient is also ordered ceftriaxone.  Plan:  Vancomycin 1.75 g IV LD followed by vancomycin 1.5 g IV q12h --Calculated AUC: 526, Cmin 14 --Levels at steady state or as clinically indicated --Daily Scr per protocol  Height: 5\' 11"  (180.3 cm) Weight: 88.9 kg (195 lb 15.8 oz) IBW/kg (Calculated) : 75.3  Temp (24hrs), Avg:97.9 F (36.6 C), Min:97.5 F (36.4 C), Max:98.5 F (36.9 C)  Recent Labs  Lab 03/18/22 1025 03/18/22 1027 03/18/22 1206  WBC  --  7.1  --   CREATININE  --  0.75  --   LATICACIDVEN 2.4*  --  1.4    Estimated Creatinine Clearance: 102 mL/min (by C-G formula based on SCr of 0.75 mg/dL).    Allergies  Allergen Reactions   Erythromycin Base Anxiety   Azithromycin Anxiety   Xanax [Alprazolam] Other (See Comments)    Tachycardia   Benzodiazepines     PT STATES HE CANNOT TAKE THESE BECAUSE THEY ALMOST KILLED HIM   Methocarbamol Anxiety and Other (See Comments)    Stomach cramping, weakness   Sulfa Antibiotics Nausea And Vomiting and Other (See Comments)    Cramping in stomach and hyperventilate.     Antimicrobials this admission: Ceftriaxone 9/15 >>  Vancomycin 9/15 >>   Dose adjustments this admission: N/A  Microbiology results: 9/15 BCx: NGTD 9/15 UCx: pending  9/16 Wcx: pending  Thank you for allowing pharmacy to be a part of this patient's care.  Benita Gutter 03/19/2022 8:53 AM

## 2022-03-19 NOTE — Assessment & Plan Note (Signed)
Hemoglobin A1c elevated at 8.1.  With patient being n.p.o. after midnight I will decrease Lantus insulin to 18 units at night

## 2022-03-20 ENCOUNTER — Encounter
Admission: EM | Disposition: A | Discharge status: Home Health | Payer: Self-pay | Source: Home / Self Care | Attending: Internal Medicine

## 2022-03-20 ENCOUNTER — Inpatient Hospital Stay: Payer: Medicaid Other | Admitting: Anesthesiology

## 2022-03-20 ENCOUNTER — Other Ambulatory Visit: Payer: Self-pay

## 2022-03-20 DIAGNOSIS — Z794 Long term (current) use of insulin: Secondary | ICD-10-CM

## 2022-03-20 DIAGNOSIS — M86161 Other acute osteomyelitis, right tibia and fibula: Secondary | ICD-10-CM | POA: Diagnosis not present

## 2022-03-20 DIAGNOSIS — L03115 Cellulitis of right lower limb: Secondary | ICD-10-CM | POA: Diagnosis not present

## 2022-03-20 DIAGNOSIS — I10 Essential (primary) hypertension: Secondary | ICD-10-CM | POA: Diagnosis not present

## 2022-03-20 DIAGNOSIS — E1165 Type 2 diabetes mellitus with hyperglycemia: Secondary | ICD-10-CM | POA: Diagnosis not present

## 2022-03-20 HISTORY — PX: INCISION AND DRAINAGE: SHX5863

## 2022-03-20 LAB — BASIC METABOLIC PANEL
Anion gap: 7 (ref 5–15)
BUN: 10 mg/dL (ref 8–23)
CO2: 28 mmol/L (ref 22–32)
Calcium: 9 mg/dL (ref 8.9–10.3)
Chloride: 105 mmol/L (ref 98–111)
Creatinine, Ser: 0.88 mg/dL (ref 0.61–1.24)
GFR, Estimated: >60 mL/min (ref >60)
Glucose, Bld: 166 mg/dL — ABNORMAL HIGH (ref 70–99)
Potassium: 4.2 mmol/L (ref 3.5–5.1)
Sodium: 140 mmol/L (ref 135–145)

## 2022-03-20 LAB — CBC
HCT: 45.2 % (ref 39.0–52.0)
Hemoglobin: 15.2 g/dL (ref 13.0–17.0)
MCH: 32.1 pg (ref 26.0–34.0)
MCHC: 33.6 g/dL (ref 30.0–36.0)
MCV: 95.6 fL (ref 80.0–100.0)
Platelets: 151 10*3/uL (ref 150–400)
RBC: 4.73 MIL/uL (ref 4.22–5.81)
RDW: 12.2 % (ref 11.5–15.5)
WBC: 7 10*3/uL (ref 4.0–10.5)
nRBC: 0 % (ref 0.0–0.2)

## 2022-03-20 LAB — TYPE AND SCREEN
ABO/RH(D): AB NEG
Antibody Screen: NEGATIVE

## 2022-03-20 LAB — GLUCOSE, CAPILLARY
Glucose-Capillary: 149 mg/dL — ABNORMAL HIGH (ref 70–99)
Glucose-Capillary: 118 mg/dL — ABNORMAL HIGH (ref 70–99)
Glucose-Capillary: 110 mg/dL — ABNORMAL HIGH (ref 70–99)
Glucose-Capillary: 170 mg/dL — ABNORMAL HIGH (ref 70–99)
Glucose-Capillary: 176 mg/dL — ABNORMAL HIGH (ref 70–99)

## 2022-03-20 LAB — SEDIMENTATION RATE: Sed Rate: 3 mm/hr (ref 0–20)

## 2022-03-20 SURGERY — INCISION AND DRAINAGE
Anesthesia: General | Site: Leg Upper | Laterality: Right

## 2022-03-20 SURGERY — AMPUTATION, ABOVE KNEE
Anesthesia: General | Site: Knee | Laterality: Right

## 2022-03-20 MED ORDER — OXYCODONE HCL 5 MG/5ML PO SOLN: 5.0000 mg | Freq: Once | ORAL | Status: DC | PRN

## 2022-03-20 MED ORDER — SODIUM CHLORIDE (PF) 0.9 % IJ SOLN
INTRAMUSCULAR | Status: DC | PRN
  Administered 2022-03-20: 30 mL
  Administered 2022-03-20: 70 mL

## 2022-03-20 MED ORDER — HYDROMORPHONE HCL 1 MG/ML IJ SOLN
INTRAMUSCULAR | Status: AC
  Filled 2022-03-20: qty 1

## 2022-03-20 MED ORDER — MORPHINE SULFATE (PF) 2 MG/ML IV SOLN
2.0000 mg | INTRAVENOUS | Status: DC | PRN
  Administered 2022-03-20 – 2022-03-22 (×5): 2 mg via INTRAVENOUS
  Filled 2022-03-20 (×5): qty 1

## 2022-03-20 MED ORDER — FENTANYL CITRATE (PF) 100 MCG/2ML IJ SOLN: 25.0000 ug | INTRAMUSCULAR | Status: DC | PRN

## 2022-03-20 MED ORDER — GLYCOPYRROLATE 0.2 MG/ML IJ SOLN
INTRAMUSCULAR | Status: DC | PRN
  Administered 2022-03-20: .2 mg via INTRAVENOUS

## 2022-03-20 MED ORDER — PHENYLEPHRINE HCL (PRESSORS) 10 MG/ML IV SOLN
INTRAVENOUS | Status: DC | PRN
  Administered 2022-03-20: 100 ug via INTRAVENOUS

## 2022-03-20 MED ORDER — INSULIN GLARGINE-YFGN 100 UNIT/ML ~~LOC~~ SOLN
20.0000 [IU] | Freq: Every day | SUBCUTANEOUS | Status: DC
  Administered 2022-03-20: 20 [IU] via SUBCUTANEOUS
  Filled 2022-03-20 (×2): qty 0.2

## 2022-03-20 MED ORDER — NYSTATIN 100000 UNIT/GM EX OINT
TOPICAL_OINTMENT | Freq: Two times a day (BID) | CUTANEOUS | Status: DC
  Filled 2022-03-20 (×2): qty 15

## 2022-03-20 MED ORDER — ACETAMINOPHEN 10 MG/ML IV SOLN: 1000.0000 mg | Freq: Once | INTRAVENOUS | Status: DC | PRN

## 2022-03-20 MED ORDER — PROPOFOL 10 MG/ML IV BOLUS
INTRAVENOUS | Status: AC
  Filled 2022-03-20: qty 20

## 2022-03-20 MED ORDER — INSULIN ASPART 100 UNIT/ML IJ SOLN
0.0000 [IU] | Freq: Three times a day (TID) | INTRAMUSCULAR | Status: DC
  Administered 2022-03-21: 3 [IU] via SUBCUTANEOUS
  Administered 2022-03-21: 1 [IU] via SUBCUTANEOUS
  Administered 2022-03-21: 3 [IU] via SUBCUTANEOUS
  Administered 2022-03-22 (×2): 2 [IU] via SUBCUTANEOUS
  Administered 2022-03-22: 1 [IU] via SUBCUTANEOUS
  Administered 2022-03-23: 2 [IU] via SUBCUTANEOUS
  Administered 2022-03-23: 3 [IU] via SUBCUTANEOUS
  Filled 2022-03-20 (×8): qty 1

## 2022-03-20 MED ORDER — FENTANYL CITRATE (PF) 100 MCG/2ML IJ SOLN
INTRAMUSCULAR | Status: AC
  Filled 2022-03-20: qty 2

## 2022-03-20 MED ORDER — PROPOFOL 10 MG/ML IV BOLUS
INTRAVENOUS | Status: DC | PRN
  Administered 2022-03-20: 160 mg via INTRAVENOUS

## 2022-03-20 MED ORDER — ONDANSETRON HCL 4 MG/2ML IJ SOLN: 4.0000 mg | Freq: Once | INTRAMUSCULAR | Status: DC | PRN

## 2022-03-20 MED ORDER — OXYCODONE HCL 5 MG PO TABS: 5.0000 mg | ORAL_TABLET | Freq: Once | ORAL | Status: DC | PRN

## 2022-03-20 MED ORDER — KETOROLAC TROMETHAMINE 30 MG/ML IJ SOLN
30.0000 mg | Freq: Four times a day (QID) | INTRAMUSCULAR | Status: AC | PRN
  Administered 2022-03-20 – 2022-03-22 (×4): 30 mg via INTRAVENOUS
  Filled 2022-03-20 (×4): qty 1

## 2022-03-20 MED ORDER — 0.9 % SODIUM CHLORIDE (POUR BTL) OPTIME
TOPICAL | Status: DC | PRN
  Administered 2022-03-20: 500 mL

## 2022-03-20 MED ORDER — PHENYLEPHRINE HCL-NACL 20-0.9 MG/250ML-% IV SOLN
INTRAVENOUS | Status: DC | PRN
  Administered 2022-03-20: 20 ug/min via INTRAVENOUS

## 2022-03-20 MED ORDER — FENTANYL CITRATE (PF) 100 MCG/2ML IJ SOLN
INTRAMUSCULAR | Status: DC | PRN
  Administered 2022-03-20 (×2): 50 ug via INTRAVENOUS

## 2022-03-20 MED ORDER — LACTATED RINGERS IV SOLN: INTRAVENOUS | Status: DC

## 2022-03-20 MED ORDER — LIDOCAINE HCL (PF) 2 % IJ SOLN
INTRAMUSCULAR | Status: AC
  Filled 2022-03-20: qty 5

## 2022-03-20 MED ORDER — LACTATED RINGERS IV SOLN: INTRAVENOUS | Status: DC | PRN

## 2022-03-20 MED ORDER — ONDANSETRON HCL 4 MG/2ML IJ SOLN
INTRAMUSCULAR | Status: DC | PRN
  Administered 2022-03-20: 4 mg via INTRAVENOUS

## 2022-03-20 MED ORDER — ONDANSETRON HCL 4 MG/2ML IJ SOLN
INTRAMUSCULAR | Status: AC
  Filled 2022-03-20: qty 2

## 2022-03-20 MED ORDER — LIDOCAINE HCL (CARDIAC) PF 100 MG/5ML IV SOSY
PREFILLED_SYRINGE | INTRAVENOUS | Status: DC | PRN
  Administered 2022-03-20: 80 mg via INTRAVENOUS

## 2022-03-20 MED ORDER — HYDROMORPHONE HCL 1 MG/ML IJ SOLN
INTRAMUSCULAR | Status: DC | PRN
  Administered 2022-03-20 (×3): .5 mg via INTRAVENOUS

## 2022-03-20 MED ORDER — TRAZODONE HCL 50 MG PO TABS
50.0000 mg | ORAL_TABLET | Freq: Every day | ORAL | Status: DC
  Administered 2022-03-21 – 2022-03-22 (×2): 50 mg via ORAL
  Filled 2022-03-20 (×3): qty 1

## 2022-03-20 MED ORDER — DEXMEDETOMIDINE HCL IN NACL 80 MCG/20ML IV SOLN
INTRAVENOUS | Status: AC
  Filled 2022-03-20: qty 20

## 2022-03-20 SURGICAL SUPPLY — 44 items
APL PRP STRL LF DISP 70% ISPRP (MISCELLANEOUS) ×1
BLADE SAGITTAL WIDE XTHICK NO (BLADE) IMPLANT
BLADE SAW SAG 25.4X90 (BLADE) ×2 IMPLANT
BNDG CMPR 5X4 CHSV STRCH STRL (GAUZE/BANDAGES/DRESSINGS) ×1
BNDG CMPR STD VLCR NS LF 5.8X6 (GAUZE/BANDAGES/DRESSINGS) ×1
BNDG COHESIVE 4X5 TAN STRL LF (GAUZE/BANDAGES/DRESSINGS) ×2 IMPLANT
BNDG ELASTIC 6X5.8 VLCR NS LF (GAUZE/BANDAGES/DRESSINGS) ×2 IMPLANT
BNDG GAUZE DERMACEA FLUFF 4 (GAUZE/BANDAGES/DRESSINGS) ×4 IMPLANT
BNDG GZE DERMACEA 4 6PLY (GAUZE/BANDAGES/DRESSINGS) ×2
BRUSH SCRUB EZ  4% CHG (MISCELLANEOUS) ×1
BRUSH SCRUB EZ 4% CHG (MISCELLANEOUS) ×2 IMPLANT
CHLORAPREP W/TINT 26 (MISCELLANEOUS) ×2 IMPLANT
DRAPE INCISE IOBAN 66X45 STRL (DRAPES) IMPLANT
ELECT CAUTERY BLADE 6.4 (BLADE) ×2 IMPLANT
ELECT REM PT RETURN 9FT ADLT (ELECTROSURGICAL) ×1
ELECTRODE REM PT RTRN 9FT ADLT (ELECTROSURGICAL) ×2 IMPLANT
GAUZE XEROFORM 1X8 LF (GAUZE/BANDAGES/DRESSINGS) ×4 IMPLANT
GLOVE BIO SURGEON STRL SZ7 (GLOVE) ×2 IMPLANT
GOWN STRL REUS W/ TWL LRG LVL3 (GOWN DISPOSABLE) ×2 IMPLANT
GOWN STRL REUS W/ TWL XL LVL3 (GOWN DISPOSABLE) ×2 IMPLANT
GOWN STRL REUS W/TWL LRG LVL3 (GOWN DISPOSABLE) ×1
GOWN STRL REUS W/TWL XL LVL3 (GOWN DISPOSABLE) ×1
HANDLE YANKAUER SUCT BULB TIP (MISCELLANEOUS) ×2 IMPLANT
KIT TURNOVER KIT A (KITS) ×2 IMPLANT
LABEL OR SOLS (LABEL) ×2 IMPLANT
MANIFOLD NEPTUNE II (INSTRUMENTS) ×2 IMPLANT
NEEDLE HYPO 22GX1.5 SAFETY (NEEDLE) IMPLANT
NS IRRIG 1000ML POUR BTL (IV SOLUTION) ×2 IMPLANT
PACK EXTREMITY ARMC (MISCELLANEOUS) ×2 IMPLANT
PAD ABD DERMACEA PRESS 5X9 (GAUZE/BANDAGES/DRESSINGS) ×4 IMPLANT
PAD PREP 24X41 OB/GYN DISP (PERSONAL CARE ITEMS) ×2 IMPLANT
SPONGE T-LAP 18X18 ~~LOC~~+RFID (SPONGE) ×2 IMPLANT
STAPLER SKIN PROX 35W (STAPLE) ×2 IMPLANT
STOCKINETTE M/LG 89821 (MISCELLANEOUS) ×2 IMPLANT
SUT ETHILON 2 0 FS 18 (SUTURE) IMPLANT
SUT SILK 2 0 (SUTURE) ×1
SUT SILK 2 0 SH (SUTURE) ×4 IMPLANT
SUT SILK 2-0 18XBRD TIE 12 (SUTURE) ×2 IMPLANT
SUT SILK 3 0 (SUTURE) ×1
SUT SILK 3-0 18XBRD TIE 12 (SUTURE) ×2 IMPLANT
SUT VIC AB 0 CT1 36 (SUTURE) ×4 IMPLANT
SUT VIC AB 2-0 CT1 (SUTURE) ×4 IMPLANT
TRAP FLUID SMOKE EVACUATOR (MISCELLANEOUS) ×2 IMPLANT
WATER STERILE IRR 500ML POUR (IV SOLUTION) ×2 IMPLANT

## 2022-03-20 NOTE — Op Note (Signed)
  [facility]   OPERATIVE NOTE   PROCEDURE:  Right above-the-knee amputation  PRE-OPERATIVE DIAGNOSIS: Right BKA stump osteomyelitis gangrene  POST-OPERATIVE DIAGNOSIS: same as above  SURGEON:  Jamesetta So, MD  ASSISTANT(S): None  ANESTHESIA: general  ESTIMATED BLOOD LOSS: 100 cc  FINDING(S): Gangrene/abscess and severe knee contracture of RIGHT BKA stump  SPECIMEN(S):  Right above-the-knee amputation  INDICATIONS:   [name] is a 61 y.o. male who presents with right BKA abscess, osteomyelitis and gangrene.  The patient is scheduled for a right above-the-knee amputation.  I discussed in depth with the patient the risks, benefits, and alternatives to this procedure.  The patient is aware that the risk of this operation included but are not limited to:  bleeding, infection, myocardial infarction, stroke, death, failure to heal amputation wound, and possible need for more proximal amputation.  The patient is aware of the risks and agrees proceed forward with the procedure.  DESCRIPTION: After full informed written consent was obtained from the patient, the patient was taken to the operating room, and placed supine upon the operating table.  Prior to induction, the patient received IV antibiotics.  The patient was then prepped and draped in the standard fashion for a right above-the-knee amputation.  After obtaining adequate anesthesia, the patient was prepped and draped in the standard fashion for a above-the-knee amputation.  I marked out the anterior and posterior flaps for a fish-mouth type of amputation.  I made the incisions for these flaps, and then dissected through the subcutaneous tissue, fascia, and muscles circumferentially.  I elevated  the periosteal tissue 4-5 cm more proximal than the anterior skin flap.  I then transected the femur with a power saw at this level.  Then I smoothed out the rough edges of the bone.  At this point, the specimen was  passed off the field as the above-the-knee amputation.  At this point, I clamped all visibly bleeding arteries and veins using a combination of suture ligation with silk suture and electrocautery.   Bleeding continued to be controlled with electrocautery and suture ligature.  The stump was washed off with sterile normal saline and no further active bleeding was noted.  I reapproximated the anterior and posterior fascia  with interrupted stitches of 0 Vicryl.  This was completed along the entire length of anterior and posterior fascia until there were no more loose space in the fascial line. The subcutaneous tissue was then approximated with 2-0 vicryl sutures. The skin was then  reapproximated with staples and 2-0 Nylon mattress sutures.  The stump was washed off and dried.  The incision was dressed with Xeroform and ABD pads, and  then fluffs were applied.  Kerlix was wrapped around the leg and then gently an ACE wrap was applied.  A large Ioban was then placed over the ACE wrap to secure the dressing. The patient was then awakened and take to the recovery room in stable condition.   COMPLICATIONS: none  CONDITION: stable  Donnah Levert A  03/20/2022, 11:58 AM   This note was created with Dragon Medical transcription system. Any errors in dictation are purely unintentional.

## 2022-03-20 NOTE — Transfer of Care (Signed)
Immediate Anesthesia Transfer of Care Note  Patient: [name]  Procedure(s) Performed: ABOVE THE KNEE AMPUTATION (Right: Leg Upper)  Patient Location: PACU  Anesthesia Type:General  Level of Consciousness: awake  Airway & Oxygen Therapy: Patient Spontanous Breathing  Post-op Assessment: Report given to RN  Post vital signs: stable  Last Vitals:  Vitals Value Taken Time  BP    Temp 98.37f   Pulse    Resp    SpO2      Last Pain:  Vitals:   03/20/22 0837  TempSrc:   PainSc: 6          Complications: No notable events documented.

## 2022-03-20 NOTE — Anesthesia Preprocedure Evaluation (Addendum)
Anesthesia Evaluation  Patient identified by MRN, date of birth, ID band Patient awake    Reviewed: Allergy & Precautions, NPO status , Patient's Chart, lab work & pertinent test results  History of Anesthesia Complications Negative for: history of anesthetic complications  Airway Mallampati: III   Neck ROM: Full    Dental  (+) Missing   Pulmonary Current Smoker (greater than 1 ppd) and Patient abstained from smoking.,    Pulmonary exam normal breath sounds clear to auscultation       Cardiovascular hypertension, + Peripheral Vascular Disease (s/p R BKA on Plavix)  Normal cardiovascular exam Rhythm:Regular Rate:Normal  Hx DVT  ECG 05/26/21:  Normal sinus rhythm Cannot rule out Anterior infarct , age undetermined  Echo 11/27/20:  1. The left ventricle is upper normal in size with normal wall thickness.  2. The left ventricular systolic function is normal, LVEF is visually estimated at 55%.  3. The right ventricle is normal in size, with normal systolic function.   Neuro/Psych PSYCHIATRIC DISORDERS Anxiety Depression  Neuromuscular disease (diabetic polyneuropathy)    GI/Hepatic negative GI ROS,   Endo/Other  diabetes, Type 2, Insulin Dependent  Renal/GU      Musculoskeletal  (+) Arthritis ,   Abdominal   Peds  Hematology negative hematology ROS (+)   Anesthesia Other Findings   Reproductive/Obstetrics                            Anesthesia Physical Anesthesia Plan  ASA: 3 and emergent  Anesthesia Plan: General   Post-op Pain Management:    Induction: Intravenous  PONV Risk Score and Plan: 1 and Ondansetron, Dexamethasone and Treatment may vary due to age or medical condition  Airway Management Planned: LMA  Additional Equipment:   Intra-op Plan:   Post-operative Plan: Extubation in OR  Informed Consent: I have reviewed the patients History and Physical, chart, labs  and discussed the procedure including the risks, benefits and alternatives for the proposed anesthesia with the patient or authorized representative who has indicated his/her understanding and acceptance.     Dental advisory given  Plan Discussed with: CRNA  Anesthesia Plan Comments: (Patient consented for risks of anesthesia including but not limited to:  - adverse reactions to medications - damage to eyes, teeth, lips or other oral mucosa - nerve damage due to positioning  - sore throat or hoarseness - damage to heart, brain, nerves, lungs, other parts of body or loss of life  Informed patient about role of CRNA in peri- and intra-operative care.  Patient voiced understanding.)       Anesthesia Quick Evaluation

## 2022-03-20 NOTE — H&P (Signed)
[facility] History & Physical Update  The patient was interviewed and re-examined.  The patient's previous History and Physical has been reviewed and is unchanged.  There is no change in the plan of care. We plan to proceed with the scheduled procedure.  Evaristo Bury, MD  03/20/2022, 9:32 AM

## 2022-03-20 NOTE — Progress Notes (Signed)
PT Cancellation Note  Patient Details Name: SOMA LIZAK MRN: [id number] DOB: 07-Jan-1961   Cancelled Treatment:    Reason Eval/Treat Not Completed: Patient at procedure or test/unavailable. Pt to OR for R transfemoral amputation. PT will continue to f/u with pt acutely and await medical appropriateness prior to initiating evaluation.    Clearnce Sorrel Wonda Goodgame 03/20/2022, 12:14 PM

## 2022-03-20 NOTE — Plan of Care (Signed)

## 2022-03-20 NOTE — Anesthesia Procedure Notes (Signed)
Procedure Name: LMA Insertion Date/Time: 03/20/2022 5:00 PM  Performed by: Garner Nash, CRNAPre-anesthesia Checklist: Patient identified, Emergency Drugs available, Suction available and Patient being monitored Patient Re-evaluated:Patient Re-evaluated prior to induction Oxygen Delivery Method: Circle system utilized Preoxygenation: Pre-oxygenation with 100% oxygen Induction Type: IV induction LMA: LMA inserted LMA Size: 4.0 Number of attempts: 1 Placement Confirmation: positive ETCO2 and breath sounds checked- equal and bilateral Tube secured with: Tape Dental Injury: Teeth and Oropharynx as per pre-operative assessment

## 2022-03-20 NOTE — Progress Notes (Addendum)
  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  Continue antibiotics for couple days and hopefully will be able to discontinue completely  Cellulitis and abscess of right leg MRI showing deep abscess.  Patient has a nonhealing wound going on for months.  Continue IV antibiotics.  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  Increase Semglee insulin to 20 units at night  Essential hypertension Continue metoprolol  Adjustment disorder with mixed anxiety and depressed mood Zolft 50 mg daily for anxiety  Hyponatremia Sodium now in the normal range.  Peripheral vascular disease ([place]) Patient was not taking Plavix at home  Hyperlipidemia Restart Lipitor        Subjective: Patient seen this morning prior to AKA.  Did have a little muscle strain in his left hip.  Did not sleep last night.  Physical Exam: Vitals:   03/20/22 1215 03/20/22 1219 03/20/22 1223 03/20/22 1244  BP: (!) 148/96  (!) 148/96 (!) 156/94  Pulse: 83 77 81 81  Resp:  13 (!) 21 16  Temp:   98.2 F (36.8 C) 97.8 F (36.6 C)  TempSrc:      SpO2: 95% 95% 97% 100%  Weight:      Height:       Physical Exam HENT:     Head: Normocephalic.     Mouth/Throat:     Pharynx: No oropharyngeal exudate.  Eyes:     General: Lids are normal.     Conjunctiva/sclera: Conjunctivae normal.  Cardiovascular:     Rate and Rhythm: Normal rate and regular rhythm.     Heart sounds: Normal heart sounds, S1 normal and S2 normal.  Pulmonary:     Breath sounds: No decreased breath sounds, wheezing, rhonchi or rales.  Abdominal:     Palpations: Abdomen is soft.     Tenderness: There is no abdominal  tenderness.  Musculoskeletal:     Left lower leg: No swelling.  Skin:    Comments: Right leg stump wrapped.  Neurological:     Mental Status: He is alert and oriented to person, place, and time.     Data Reviewed: Hemoglobin A1c 8.1, sedimentation rate 3, white blood cell count 7.0, hemoglobin 13.2, creatinine 0.88  Family Communication: Declined  Disposition: Status is: Inpatient Remains inpatient appropriate because: AKA today  Planned Discharge Destination: Home with Home Health    Time spent: 28 minutes  Author: Loletha Grayer, MD 03/20/2022 2:42 PM  For on call review [email or web address].

## 2022-03-20 NOTE — Anesthesia Postprocedure Evaluation (Signed)
Anesthesia Post Note  Patient: [name]  Procedure(s) Performed: ABOVE THE KNEE AMPUTATION (Right: Leg Upper)  Patient location during evaluation: PACU Anesthesia Type: General Level of consciousness: awake and alert, oriented and patient cooperative Pain management: pain level controlled Vital Signs Assessment: post-procedure vital signs reviewed and stable Respiratory status: spontaneous breathing, nonlabored ventilation and respiratory function stable Cardiovascular status: blood pressure returned to baseline and stable Postop Assessment: adequate PO intake Anesthetic complications: no   No notable events documented.   Last Vitals:  Vitals:   03/20/22 1219 03/20/22 1223  BP:  (!) 148/96  Pulse: 77 81  Resp: 13 (!) 21  Temp:  36.8 C  SpO2: 95% 97%    Last Pain:  Vitals:   03/20/22 1223  TempSrc:   PainSc: 0-No pain                 Darrin Nipper

## 2022-03-21 ENCOUNTER — Encounter: Payer: Self-pay | Admitting: Vascular Surgery

## 2022-03-21 DIAGNOSIS — M86161 Other acute osteomyelitis, right tibia and fibula: Secondary | ICD-10-CM | POA: Diagnosis not present

## 2022-03-21 DIAGNOSIS — F4323 Adjustment disorder with mixed anxiety and depressed mood: Secondary | ICD-10-CM | POA: Diagnosis not present

## 2022-03-21 DIAGNOSIS — E1165 Type 2 diabetes mellitus with hyperglycemia: Secondary | ICD-10-CM | POA: Diagnosis not present

## 2022-03-21 DIAGNOSIS — D696 Thrombocytopenia, unspecified: Secondary | ICD-10-CM

## 2022-03-21 DIAGNOSIS — L03115 Cellulitis of right lower limb: Secondary | ICD-10-CM | POA: Diagnosis not present

## 2022-03-21 LAB — CBC
HCT: 43.3 % (ref 39.0–52.0)
Hemoglobin: 14.5 g/dL (ref 13.0–17.0)
MCH: 31.9 pg (ref 26.0–34.0)
MCHC: 33.5 g/dL (ref 30.0–36.0)
MCV: 95.2 fL (ref 80.0–100.0)
Platelets: 135 10*3/uL — ABNORMAL LOW (ref 150–400)
RBC: 4.55 MIL/uL (ref 4.22–5.81)
RDW: 12.2 % (ref 11.5–15.5)
WBC: 8 10*3/uL (ref 4.0–10.5)
nRBC: 0 % (ref 0.0–0.2)

## 2022-03-21 LAB — BASIC METABOLIC PANEL
Anion gap: 7 (ref 5–15)
BUN: 13 mg/dL (ref 8–23)
CO2: 25 mmol/L (ref 22–32)
Calcium: 8.9 mg/dL (ref 8.9–10.3)
Chloride: 104 mmol/L (ref 98–111)
Creatinine, Ser: 0.84 mg/dL (ref 0.61–1.24)
GFR, Estimated: >60 mL/min (ref >60)
Glucose, Bld: 231 mg/dL — ABNORMAL HIGH (ref 70–99)
Potassium: 4 mmol/L (ref 3.5–5.1)
Sodium: 136 mmol/L (ref 135–145)

## 2022-03-21 LAB — GLUCOSE, CAPILLARY
Glucose-Capillary: 210 mg/dL — ABNORMAL HIGH (ref 70–99)
Glucose-Capillary: 228 mg/dL — ABNORMAL HIGH (ref 70–99)
Glucose-Capillary: 140 mg/dL — ABNORMAL HIGH (ref 70–99)
Glucose-Capillary: 210 mg/dL — ABNORMAL HIGH (ref 70–99)

## 2022-03-21 MED ORDER — SENNOSIDES-DOCUSATE SODIUM 8.6-50 MG PO TABS
2.0000 | ORAL_TABLET | Freq: Once | ORAL | Status: AC
  Administered 2022-03-21: 2 via ORAL
  Filled 2022-03-21: qty 2

## 2022-03-21 MED ORDER — INSULIN GLARGINE-YFGN 100 UNIT/ML ~~LOC~~ SOLN
26.0000 [IU] | Freq: Every day | SUBCUTANEOUS | Status: DC
  Administered 2022-03-21 – 2022-03-22 (×2): 26 [IU] via SUBCUTANEOUS
  Filled 2022-03-21 (×3): qty 0.26

## 2022-03-21 NOTE — Evaluation (Signed)
Physical Therapy Evaluation Patient Details Name: [name] MRN: [id number] DOB: 1960/09/24 Today's Date: 03/21/2022  History of Present Illness  Pt is a 61 y.o. male presenting to hospital 9/15 with c/o continued R stump pain (h/o R BKA), and muscle spasms.  Recent ED visit for cellulitis R LE amputation site.  Pt now admitted with cellulitis and abscess of R leg, sepsis d/t cellulitis, and osteomyelitis.  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  PMH includes R BKA May '23 in [place], [place], htn, and PVD.  Clinical Impression  Prior to hospital admission, pt was modified independent with w/c level functional mobility; lives with his friend in 1 level home with steps to enter (pt reports having a specific strategy navigating steps in/out of home on his own that works well for him but difficult to simulate in hospital).  Currently pt is modified independent with transfers and modified independent with manual w/c propulsion 200 feet (including obstacles in hallway and in room).  Mobility currently limited d/t R AKA pain but pt appearing very motivated to mobilize as able (pt reports sleeping in manual w/c last night and got into bed for a little bit this morning before getting back out into w/c--pt was sitting in manual w/c upon therapists arrival).  Pt would benefit from skilled PT to address noted impairments and functional limitations and for R LE AKA strengthening/ROM/HEP (see below for any additional details).  Upon hospital discharge, pt would benefit from HHPT.    Recommendations for follow up therapy are one component of a multi-disciplinary discharge planning process, led by the attending physician.  Recommendations may be updated based on patient status, additional functional criteria and insurance authorization.  Follow Up Recommendations Home health PT      Assistance Recommended at Discharge PRN  Patient can return home with the following  A little help with walking and/or transfers;A little help with  bathing/dressing/bathroom;Assistance with cooking/housework;Assist for transportation;Help with stairs or ramp for entrance    Equipment Recommendations  (pt has needed DME at home already)  Recommendations for Other Services       Functional Status Assessment Patient has had a recent decline in their functional status and/or demonstrates limited ability to make significant improvements in function in a reasonable and predictable amount of time     Precautions / Restrictions Precautions Precautions: Fall Restrictions Weight Bearing Restrictions: Yes RLE Weight Bearing: Non weight bearing Other Position/Activity Restrictions: s/p R AKA 9/17      Mobility  Bed Mobility Overal bed mobility: Modified Independent             General bed mobility comments: Sit to/from supine with HOB elevated    Transfers Overall transfer level: Modified independent Equipment used: None               General transfer comment: steady and safe squat pivot transfer manual w/c to bed    Ambulation/Gait Ambulation/Gait assistance:  (Deferred (pt has been non-ambulatory))                Administrator mobility: Yes Wheelchair propulsion: Both upper extremities Wheelchair parts: Independent Distance: 200 feet Wheelchair Assistance Details (indicate cue type and reason): pt did well navigating obstacles in room and hallway with manual w/c  Modified Rankin (Stroke Patients Only)       Balance Overall balance assessment: Needs assistance Sitting-balance support: Single extremity supported (single LE supported) Sitting balance-Leahy Scale:  Good Sitting balance - Comments: steady sitting reaching within BOS       Standing balance comment: not tested                             Pertinent Vitals/Pain Pain Assessment Pain Assessment: Faces Faces Pain Scale: Hurts even more Pain Location: R AKA surgical  site Pain Descriptors / Indicators: Guarding, Grimacing, Tender Pain Intervention(s): Limited activity within patient's tolerance, Monitored during session, Premedicated before session, Repositioned, Other (comment) (RN notified of pt's pain level) Vitals (HR and O2 on room air) stable and WFL throughout treatment session.    Home Living Family/patient expects to be discharged to:: Private residence Living Arrangements: Non-relatives/Friends (Friend Iona Hansen)) Available Help at Discharge: Friend(s);Available PRN/intermittently Type of Home: House Home Access: Stairs to enter   [employer] of Steps: 2 platform steps with L rail/pole and then 1 more step to enter   Home Layout: One level Home Equipment: Conservation officer, nature (2 wheels);Wheelchair - manual;Cane - single point;Shower seat;BSC/3in1;Hand held shower head;Hospital bed      Prior Function Prior Level of Function : Independent/Modified Independent;History of Falls (last six months)             Mobility Comments: Modified independent stand/squat pivot transfers and manual w/c use ADLs Comments: Per OT eval  today "Able to perform ADLs with mod I. Patient sleeps in living room of his friend's home in a hospital bed.  He is able to cook and manage meds independently, relies on friend for transportation.  Endorses one fall recently due to forgetting to place bed rail and falling out of bed (able to get up from floor)."     Hand Dominance   Dominant Hand: Right    Extremity/Trunk Assessment   Upper Extremity Assessment Upper Extremity Assessment: Overall WFL for tasks assessed    Lower Extremity Assessment Lower Extremity Assessment: RLE deficits/detail (at least 3/5 AROM L LE hip flexion, knee flexion/extension, and DF/PF) RLE Deficits / Details: at least 3-/5 hip flexion (limited d/t R AKA surgical site pain) RLE: Unable to fully assess due to pain    Cervical / Trunk Assessment Cervical / Trunk Assessment:  Normal  Communication   Communication: No difficulties  Cognition Arousal/Alertness: Awake/alert Behavior During Therapy: WFL for tasks assessed/performed Overall Cognitive Status: Within Functional Limits for tasks assessed                                 General Comments: Pt pleasant and motivated during session.        General Comments General comments (skin integrity, edema, etc.): R AKA dressings in place    Exercises     Assessment/Plan    PT Assessment Patient needs continued PT services  PT Problem List Decreased strength;Decreased range of motion;Decreased activity tolerance;Decreased balance;Decreased mobility;Decreased knowledge of use of DME;Pain;Decreased skin integrity       PT Treatment Interventions DME instruction;Functional mobility training;Therapeutic activities;Therapeutic exercise;Balance training;Patient/family education;Wheelchair mobility training    PT Goals (Current goals can be found in the Care Plan section)  Acute Rehab PT Goals Patient Stated Goal: to improve pain and to go home PT Goal Formulation: With patient Time For Goal Achievement: 04/04/22 Potential to Achieve Goals: Good    Frequency Min 2X/week     Co-evaluation               AM-PAC PT "6  Clicks" Mobility  Outcome Measure Help needed turning from your back to your side while in a flat bed without using bedrails?: None Help needed moving from lying on your back to sitting on the side of a flat bed without using bedrails?: None Help needed moving to and from a bed to a chair (including a wheelchair)?: None Help needed standing up from a chair using your arms (e.g., wheelchair or bedside chair)?: A Little Help needed to walk in hospital room?: Total Help needed climbing 3-5 steps with a railing? : Total 6 Click Score: 17    End of Session   Activity Tolerance: Patient limited by pain Patient left: in bed;with call bell/phone within reach;with bed alarm  set Nurse Communication: Mobility status;Precautions;Other (comment) ([name] pain status) PT Visit Diagnosis: Other abnormalities of gait and mobility (R26.89);Muscle weakness (generalized) (M62.81);History of falling (Z91.81);Pain Pain - Right/Left: Right Pain - part of body:  (AKA)    Time: 0623-7628 PT Time Calculation (min) (ACUTE ONLY): 23 min   Charges:   PT Evaluation $PT Eval Low Complexity: 1 Low PT Treatments $Therapeutic Exercise: 8-22 mins       Hendricks Limes, PT 03/21/22, 12:38 PM

## 2022-03-21 NOTE — TOC Progression Note (Signed)
Transition of Care [facility]) - Progression Note    Patient Details  Name: [name] MRN: [id number] Date of Birth: Jun 17, 1961  Transition of Care [facility] CM/SW Lolita, RN Phone Number: 03/21/2022, 9:56 AM  Clinical Narrative:     Patient from home, had a Right BKA at [facility] in May 2023, had a non healing large abcess, underwent a AKA on right on 03/20/22, TOC to follow and assist with DC planning as needed He is wheelchair bound at baseline        Expected Discharge Plan and Services                                                 Social Determinants of Health (SDOH) Interventions    Readmission Risk Interventions     No data to display

## 2022-03-21 NOTE — Progress Notes (Signed)
Inpatient Diabetes Program Recommendations  AACE/ADA: New Consensus Statement on Inpatient Glycemic Control (2015)  Target Ranges:  Prepandial:   less than 140 mg/dL      Peak postprandial:   less than 180 mg/dL (1-2 hours)      Critically ill patients:  140 - 180 mg/dL   Lab Results  Component Value Date   GLUCAP 228 (H) 03/21/2022   HGBA1C 8.1 (H) 03/18/2022    Review of Glycemic Control  Latest Reference Range & Units 03/20/22 08:10 03/20/22 12:01 03/20/22 16:39 03/20/22 22:38 03/21/22 08:14 03/21/22 12:13  Glucose-Capillary 70 - 99 mg/dL 118 (H) 110 (H) 170 (H) 176 (H) 210 (H) 228 (H)   Diabetes history: DM 2 Outpatient Diabetes medications:  Lantus 30 units q HS, Metformin 1000 mg bid Current orders for Inpatient glycemic control:  Novolog 0-9 units tid with meals and HS Semglee 20 units q HS  Inpatient Diabetes Program Recommendations:   Consider increasing Semglee to 26 units q HS.   Thanks,  Adah Perl, RN, BC-ADM Inpatient Diabetes Coordinator Pager [phone]  (8a-5p)

## 2022-03-21 NOTE — Plan of Care (Signed)
  Problem: Education: Goal: Knowledge of General Education information will improve Description: Including pain rating scale, medication(s)/side effects and non-pharmacologic comfort measures Outcome: Progressing   Problem: Health Behavior/Discharge Planning: Goal: Ability to manage health-related needs will improve Outcome: Progressing   Problem: Clinical Measurements: Goal: Ability to maintain clinical measurements within normal limits will improve Outcome: Progressing Goal: Will remain free from infection Outcome: Progressing Goal: Diagnostic test results will improve Outcome: Progressing Goal: Respiratory complications will improve Outcome: Progressing Goal: Cardiovascular complication will be avoided Outcome: Progressing   Problem: Activity: Goal: Risk for activity intolerance will decrease Outcome: Progressing   Problem: Nutrition: Goal: Adequate nutrition will be maintained Outcome: Progressing   Problem: Coping: Goal: Level of anxiety will decrease Outcome: Progressing   Problem: Elimination: Goal: Will not experience complications related to bowel motility Outcome: Progressing Goal: Will not experience complications related to urinary retention Outcome: Progressing   Problem: Pain Managment: Goal: General experience of comfort will improve Outcome: Progressing   Problem: Safety: Goal: Ability to remain free from injury will improve Outcome: Progressing   Problem: Skin Integrity: Goal: Risk for impaired skin integrity will decrease Outcome: Progressing   Problem: Education: Goal: Ability to describe self-care measures that may prevent or decrease complications (Diabetes Survival Skills Education) will improve Outcome: Progressing   Problem: Coping: Goal: Ability to adjust to condition or change in health will improve Outcome: Progressing   Problem: Fluid Volume: Goal: Ability to maintain a balanced intake and output will improve Outcome:  Progressing   Problem: Health Behavior/Discharge Planning: Goal: Ability to identify and utilize available resources and services will improve Outcome: Progressing Goal: Ability to manage health-related needs will improve Outcome: Progressing   Problem: Metabolic: Goal: Ability to maintain appropriate glucose levels will improve Outcome: Progressing   Problem: Nutritional: Goal: Maintenance of adequate nutrition will improve Outcome: Progressing Goal: Progress toward achieving an optimal weight will improve Outcome: Progressing   Problem: Skin Integrity: Goal: Risk for impaired skin integrity will decrease Outcome: Progressing   Problem: Tissue Perfusion: Goal: Adequacy of tissue perfusion will improve Outcome: Progressing   

## 2022-03-21 NOTE — Plan of Care (Signed)

## 2022-03-21 NOTE — Progress Notes (Signed)
       CROSS COVER NOTE  NAME: [name] MRN: [id number] DOB : [birth year]    Date of Service   03/21/2022   HPI/Events of Note   Patient requesting laxative in addition to Miralax for reports of constipation. Per nursing patient had a small bowel movement yesterday and continues to have feelings of abdominal fullness.  Interventions   Plan: [place]      This document was prepared using Dragon voice recognition software and may include unintentional dictation errors.  Neomia Glass DNP, MHA, FNP-BC Nurse Practitioner [facility] Pager [phone]

## 2022-03-21 NOTE — Evaluation (Signed)
Occupational Therapy Evaluation Patient Details Name: [name] MRN: [id number] DOB: 07-14-1960 Today's Date: 03/21/2022   History of Present Illness [name] is a 61 year old male with PMH including DM, HTN, PVD, and R BKA who is s/p R AKA on 9/17.   Clinical Impression   [name] presents to OT with pain, strength, and ROM deficits in RLE that impact his ability to safely and independently complete self care tasks.  Prior to admission, patient was able to complete ADLs and most IADLs with modified independence.  Currently, patient is able to perform seated ADLs with setup assist and squat pivot transfers to/from wheelchair with supervision assist.  Anticipate increased assistance needed for tasks requiring standing balance, as well as new challenges and pain management post-R AKA.  Patient will continue to benefit from skilled OT services in acute setting to support pain management, safety, and independence in ADLs.  Recommend HHOT upon discharge to further support these goals.      Recommendations for follow up therapy are one component of a multi-disciplinary discharge planning process, led by the attending physician.  Recommendations may be updated based on patient status, additional functional criteria and insurance authorization.   Follow Up Recommendations  Home health OT    Assistance Recommended at Discharge Intermittent Supervision/Assistance  Patient can return home with the following A little help with walking and/or transfers;A little help with bathing/dressing/bathroom;Assist for transportation    Functional Status Assessment  Patient has had a recent decline in their functional status and demonstrates the ability to make significant improvements in function in a reasonable and predictable amount of time.  Equipment Recommendations  None recommended by OT    Recommendations for Other Services       Precautions / Restrictions Precautions Precautions:  Fall Restrictions Weight Bearing Restrictions: Yes Other Position/Activity Restrictions: s/p R AKA      Mobility Bed Mobility Overal bed mobility: Modified Independent               Patient Response: Cooperative  Transfers Overall transfer level: Modified independent                 General transfer comment: squat pivot transfers to/from manual wheelchair      Balance Overall balance assessment: Needs assistance Sitting-balance support: Single extremity supported Sitting balance-Leahy Scale: Good         Standing balance comment: not tested                           ADL either performed or assessed with clinical judgement   ADL Overall ADL's : Needs assistance/impaired Eating/Feeding: Set up;Sitting   Grooming: Set up;Sitting   Upper Body Bathing: Set up;Sitting       Upper Body Dressing : Set up;Sitting       Toilet Transfer: Supervision/safety;Squat-pivot;Regular Toilet             General ADL Comments: Requires setup assist for seated ADLs, able to perform squat pivot transfers with supervision assist.  Standing tasks not assessed.     Vision Baseline Vision/History: 1 Wears glasses Ability to See in Adequate Light: 1 Impaired Patient Visual Report: Other (comment) (reports need for new eye exam) Vision Assessment?: Vision impaired- to be further tested in functional context     Perception     Praxis      Pertinent Vitals/Pain Pain Assessment Pain Assessment: Faces Faces Pain Scale: Hurts even more Pain Location: L knee, R  surgical site Pain Descriptors / Indicators: Guarding, Grimacing Pain Intervention(s): Limited activity within patient's tolerance, Monitored during session, RN gave pain meds during session     Hand Dominance Right   Extremity/Trunk Assessment Upper Extremity Assessment Upper Extremity Assessment: Overall WFL for tasks assessed   Lower Extremity Assessment Lower Extremity Assessment: RLE  deficits/detail RLE Deficits / Details: anticipated post-op pain/ROM/strength deficits RLE: Unable to fully assess due to pain   Cervical / Trunk Assessment Cervical / Trunk Assessment: Normal   Communication Communication Communication: No difficulties   Cognition Arousal/Alertness: Awake/alert Behavior During Therapy: WFL for tasks assessed/performed Overall Cognitive Status: Within Functional Limits for tasks assessed                                 General Comments: grossly oriented, pleasant and motivated to participate in therapy     General Comments       Exercises Other Exercises Other Exercises: provided education re: OT role and plan of care, fall and safety precautions, self care, discharge and DME recommendations   Shoulder Instructions      Home Living Family/patient expects to be discharged to:: Private residence Living Arrangements: Non-relatives/Friends Available Help at Discharge: Friend(s);Available PRN/intermittently Type of Home: House       Home Layout: Able to live on main level with bedroom/bathroom     Bathroom Shower/Tub: Teacher, early years/pre: Standard     Home Equipment: Conservation officer, nature (2 wheels);Shower seat;Hand held shower head;Wheelchair - manual;Hospital bed          Prior Functioning/Environment Prior Level of Function : Independent/Modified Independent;History of Falls (last six months)             Mobility Comments: Uses wheelchair at baseline, able to perform squat pivot transfers independently ADLs Comments: Able to perform ADLs with mod I. Patient sleeps in living room of his friend's home in a hospital bed.  He is able to cook and manage meds independently, relies on friend for transportation.  Endorses one fall recently due to forgetting to place bed rail and falling out of bed (able to get up from floor).        OT Problem List: Decreased strength;Impaired balance (sitting and/or  standing);Pain      OT Treatment/Interventions: Self-care/ADL training;Therapeutic exercise;Neuromuscular education;DME and/or AE instruction;Therapeutic activities;Patient/family education;Balance training    OT Goals(Current goals can be found in the care plan section) Acute Rehab OT Goals Patient Stated Goal: to return home and return to PLOF OT Goal Formulation: With patient Time For Goal Achievement: 04/04/22 Potential to Achieve Goals: Good  OT Frequency: Min 2X/week    Co-evaluation              AM-PAC OT "6 Clicks" Daily Activity     Outcome Measure Help from another person eating meals?: None Help from another person taking care of personal grooming?: A Little Help from another person toileting, which includes using toliet, bedpan, or urinal?: A Little Help from another person bathing (including washing, rinsing, drying)?: A Little Help from another person to put on and taking off regular upper body clothing?: None Help from another person to put on and taking off regular lower body clothing?: A Little 6 Click Score: 20   End of Session    Activity Tolerance: Patient tolerated treatment well Patient left: with call bell/phone within reach (in wheelchair)  OT Visit Diagnosis: Other abnormalities of gait and mobility (R26.89);Pain  Pain - Right/Left: Right (and left) Pain - part of body: Knee                Time: 0934-1006 OT Time Calculation (min): 32 min Charges:  OT General Charges $OT Visit: 1 Visit OT Evaluation $OT Eval Moderate Complexity: 1 Mod OT Treatments $Self Care/Home Management : 8-22 mins  Iness Pangilinan, OTR/L 03/21/22, 11:09 AM

## 2022-03-21 NOTE — Progress Notes (Signed)
  Progress Note   Patient: [name] HMC:[id number] DOB: 09-01-1960 DOA: 03/18/2022     3 DOS: the patient was seen and examined on 03/21/2022     Assessment and Plan: * Osteomyelitis ([place]) Osteomyelitis right tibial stump with large abscess.  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  Continue antibiotics for couple days and hopefully will be able to discontinue completely.  Sepsis ruled out.  Cellulitis and abscess of right leg MRI showing deep abscess.  Patient has a nonhealing wound going on for months.  Continue IV antibiotics.  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  Uncontrolled type 2 diabetes mellitus with hyperglycemia, with long-term current use of insulin (HCC) Hemoglobin A1c elevated at 8.1.  Increase Semglee insulin to 26 units at night.  Essential hypertension Continue metoprolol  Adjustment disorder with mixed anxiety and depressed mood Continue Zoloft  Hyponatremia Sodium now in the normal range.  Peripheral vascular disease ([place]) Patient was not taking Plavix at home  Hyperlipidemia Continue Lipitor  Thrombocytopenia (HCC) Current platelet count 135.  Looking back at old labs he has had intermittent thrombocytopenia in the past.  We will check a hepatitis C profile tomorrow morning.        Subjective: Patient was seen sitting up in his wheelchair eating breakfast this morning.  Patient had a lot of pain last night.  Physical Exam: Vitals:   03/20/22 1244 03/20/22 1650 03/20/22 2231 03/21/22 0825  BP: (!) 156/94 (!) 158/97 (!) 152/95 136/87  Pulse: 81 76 77 81  Resp: 16 18 20 16   Temp: 97.8 F (36.6 C) 97.9 F (36.6 C) 98.3 F (36.8 C) 98.5 F (36.9 C)  TempSrc:      SpO2: 100% 97% 97% 97%  Weight:      Height:       Physical Exam HENT:     Head: Normocephalic.     Mouth/Throat:     Pharynx: No oropharyngeal exudate.  Eyes:     General: Lids are normal.     Conjunctiva/sclera: Conjunctivae normal.  Cardiovascular:     Rate and Rhythm: Normal rate and regular  rhythm.     Heart sounds: Normal heart sounds, S1 normal and S2 normal.  Pulmonary:     Breath sounds: No decreased breath sounds, wheezing, rhonchi or rales.  Abdominal:     Palpations: Abdomen is soft.     Tenderness: There is no abdominal tenderness.  Musculoskeletal:     Left lower leg: No swelling.     Right Lower Extremity: Right leg is amputated above knee.  Skin:    Comments: Right leg stump wrapped.  Neurological:     Mental Status: He is alert and oriented to person, place, and time.     Data Reviewed: Platelet count 135, creatinine 0.84  Family Communication: Declined  Disposition: Status is: Inpatient Remains inpatient appropriate because: Postoperative day 1 right AKA  Planned Discharge Destination: Home with Home Health    Time spent: 28 minutes  Author: Loletha Grayer, MD 03/21/2022 2:40 PM  For on call review [email or web address].

## 2022-03-21 NOTE — Assessment & Plan Note (Signed)
Platelet count today 123.  Looking back at old labs the patient has had intermittent low platelet counts in the past.  Hepatitis C negative.

## 2022-03-22 DIAGNOSIS — I10 Essential (primary) hypertension: Secondary | ICD-10-CM | POA: Diagnosis not present

## 2022-03-22 DIAGNOSIS — E1165 Type 2 diabetes mellitus with hyperglycemia: Secondary | ICD-10-CM | POA: Diagnosis not present

## 2022-03-22 DIAGNOSIS — M86161 Other acute osteomyelitis, right tibia and fibula: Secondary | ICD-10-CM | POA: Diagnosis not present

## 2022-03-22 DIAGNOSIS — L03115 Cellulitis of right lower limb: Secondary | ICD-10-CM | POA: Diagnosis not present

## 2022-03-22 DIAGNOSIS — K5900 Constipation, unspecified: Secondary | ICD-10-CM

## 2022-03-22 LAB — GLUCOSE, CAPILLARY
Glucose-Capillary: 146 mg/dL — ABNORMAL HIGH (ref 70–99)
Glucose-Capillary: 190 mg/dL — ABNORMAL HIGH (ref 70–99)
Glucose-Capillary: 175 mg/dL — ABNORMAL HIGH (ref 70–99)
Glucose-Capillary: 240 mg/dL — ABNORMAL HIGH (ref 70–99)

## 2022-03-22 LAB — CBC
HCT: 43.1 % (ref 39.0–52.0)
Hemoglobin: 14.4 g/dL (ref 13.0–17.0)
MCH: 32.2 pg (ref 26.0–34.0)
MCHC: 33.4 g/dL (ref 30.0–36.0)
MCV: 96.4 fL (ref 80.0–100.0)
Platelets: 123 10*3/uL — ABNORMAL LOW (ref 150–400)
RBC: 4.47 MIL/uL (ref 4.22–5.81)
RDW: 12.2 % (ref 11.5–15.5)
WBC: 7.4 10*3/uL (ref 4.0–10.5)
nRBC: 0 % (ref 0.0–0.2)

## 2022-03-22 LAB — HEPATITIS C ANTIBODY: HCV Ab: NONREACTIVE

## 2022-03-22 MED ORDER — BISACODYL 10 MG RE SUPP: 10.0000 mg | Freq: Once | RECTAL | Status: DC

## 2022-03-22 MED ORDER — POLYETHYLENE GLYCOL 3350 17 G PO PACK
17.0000 g | PACK | Freq: Two times a day (BID) | ORAL | Status: DC
  Administered 2022-03-22 – 2022-03-23 (×2): 17 g via ORAL
  Filled 2022-03-22 (×2): qty 1

## 2022-03-22 MED ORDER — ASPIRIN 81 MG PO TBEC
81.0000 mg | DELAYED_RELEASE_TABLET | Freq: Every day | ORAL | Status: DC
  Administered 2022-03-23: 81 mg via ORAL
  Filled 2022-03-22: qty 1

## 2022-03-22 MED ORDER — FLEET ENEMA 7-19 GM/118ML RE ENEM: 1.0000 | ENEMA | Freq: Every day | RECTAL | Status: DC | PRN

## 2022-03-22 MED ORDER — LACTULOSE 10 GM/15ML PO SOLN
30.0000 g | Freq: Once | ORAL | Status: AC
  Administered 2022-03-22: 30 g via ORAL
  Filled 2022-03-22: qty 60

## 2022-03-22 NOTE — Progress Notes (Signed)
Physical Therapy Treatment Patient Details Name: [name] [name] MRN: [id number] DOB: 01-Feb-1961 Today's Date: 03/22/2022   History of Present Illness Pt is a 61 y.o. male presenting to hospital 9/15 with c/o continued R stump pain (h/o R BKA), and muscle spasms.  Recent ED visit for cellulitis R LE amputation site.  Pt now admitted with cellulitis and abscess of R leg, sepsis d/t cellulitis, and osteomyelitis.  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  PMH includes R BKA May '23 in [place], [place], htn, and PVD.    PT Comments    Pt sitting in manual w/c upon PT arrival; agreeable to PT but pt did not feel up to R AKA ex's d/t significant R LE residual limb pain with any movement (so R AKA HEP deferred today)--nurse notified regarding pt's request for pain meds.  During session pt modified independent with w/c level transfers and modified independent propelling manual w/c 240 feet and then 160 feet with B UE's.  Will attempt to initiate R AKA HEP next session.    Recommendations for follow up therapy are one component of a multi-disciplinary discharge planning process, led by the attending physician.  Recommendations may be updated based on patient status, additional functional criteria and insurance authorization.  Follow Up Recommendations  Home health PT     Assistance Recommended at Discharge PRN  Patient can return home with the following A little help with walking and/or transfers;A little help with bathing/dressing/bathroom;Assistance with cooking/housework;Assist for transportation;Help with stairs or ramp for entrance   Equipment Recommendations  Other (comment) (pt has needed DME at home already)    Recommendations for Other Services       Precautions / Restrictions Precautions Precautions: Fall Restrictions Weight Bearing Restrictions: Yes RLE Weight Bearing: Non weight bearing Other Position/Activity Restrictions: s/p R AKA 9/17     Mobility  Bed Mobility Overal bed mobility:  (Deferred (pt  sitting edge of bed end of session per pt comfort))                  Transfers Overall transfer level: Modified independent Equipment used: None               General transfer comment: steady and safe squat pivot transfer manual w/c to/from [facility] over toilet in bathroom and manual w/c to bed with UE support    Ambulation/Gait               General Gait Details: Deferred (pt has been non-ambulatory)   Psychologist, counselling mobility: Yes Wheelchair propulsion: Both upper extremities Wheelchair parts: Independent Distance: 240 feet; 160 feet Wheelchair Assistance Details (indicate cue type and reason): pt did well navigating obstacles in room, bathroom, and hallway with manual w/c  Modified Rankin (Stroke Patients Only)       Balance Overall balance assessment: Needs assistance Sitting-balance support: Single extremity supported (single LE supported) Sitting balance-Leahy Scale: Good Sitting balance - Comments: steady sitting reaching within BOS   Standing balance support: Single extremity supported Standing balance-Leahy Scale: Good Standing balance comment: steady standing managing pants for toileting (single UE support on grab bar for balance with opposite UE managing pants)                            Cognition Arousal/Alertness: Awake/alert Behavior During Therapy: WFL for tasks assessed/performed Overall Cognitive Status: Within Functional Limits for tasks  assessed                                 General Comments: Pt pleasant and motivated during session.        Exercises      General Comments General comments (skin integrity, edema, etc.): R AKA dressings in place      Pertinent Vitals/Pain Pain Assessment Pain Assessment: 0-10 Pain Score: 8  Pain Location: R AKA surgical site Pain Descriptors / Indicators: Guarding, Grimacing, Tender, Sore, Shooting,  Sharp Pain Intervention(s): Limited activity within patient's tolerance, Monitored during session, Premedicated before session, Repositioned, Patient requesting pain meds-RN notified Vitals (HR and O2 on room air) stable and WFL throughout treatment session.    Home Living                          Prior Function            PT Goals (current goals can now be found in the care plan section) Acute Rehab PT Goals Patient Stated Goal: to improve pain and to go home PT Goal Formulation: With patient Time For Goal Achievement: 04/04/22 Potential to Achieve Goals: Good Progress towards PT goals: Progressing toward goals    Frequency    Min 2X/week      PT Plan Current plan remains appropriate    Co-evaluation              AM-PAC PT "6 Clicks" Mobility   Outcome Measure  Help needed turning from your back to your side while in a flat bed without using bedrails?: None   Help needed moving to and from a bed to a chair (including a wheelchair)?: None Help needed standing up from a chair using your arms (e.g., wheelchair or bedside chair)?: A Little Help needed to walk in hospital room?: Total Help needed climbing 3-5 steps with a railing? : Total 6 Click Score: 13    End of Session   Activity Tolerance: Patient limited by pain Patient left: with call bell/phone within reach (sitting on edge of bed with all needs in reach) Nurse Communication: Mobility status;Patient requests pain meds;Precautions PT Visit Diagnosis: Other abnormalities of gait and mobility (R26.89);Muscle weakness (generalized) (M62.81);History of falling (Z91.81);Pain Pain - Right/Left: Right Pain - part of body:  (AKA)     Time: 1660-6301 PT Time Calculation (min) (ACUTE ONLY): 31 min  Charges:  $Therapeutic Exercise: 8-22 mins $Therapeutic Activity: 8-22 mins                     Leitha Bleak, PT 03/22/22, 5:16 PM

## 2022-03-22 NOTE — Progress Notes (Signed)
Occupational Therapy Treatment Patient Details Name: [name] MRN: [id number] DOB: 01-Oct-1960 Today's Date: 03/22/2022   History of present illness Pt is a 61 y.o. male presenting to hospital 9/15 with c/o continued R stump pain (h/o R BKA), and muscle spasms.  Recent ED visit for cellulitis R LE amputation site.  Pt now admitted with cellulitis and abscess of R leg, sepsis d/t cellulitis, and osteomyelitis.  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  PMH includes R BKA May '23 in [place], [place], htn, and PVD.   OT comments  Pt seen for OT tx on 3rd attempt. Pt in w/c fully dressed and requesting coffee. OT provided pt with coffee after clearing with RN. Pt endorsing 10/10 residual limb pain. RN notified. Pt educated in desensitization strategies and pain mgt strategies to support recovery, pt able to return demo. Pt able to reposition himself in the w/c without assist. Pt continues to benefit from skilled OT to maximize recovery and safety/indep.    Recommendations for follow up therapy are one component of a multi-disciplinary discharge planning process, led by the attending physician.  Recommendations may be updated based on patient status, additional functional criteria and insurance authorization.    Follow Up Recommendations  Home health OT    Assistance Recommended at Discharge Intermittent Supervision/Assistance  Patient can return home with the following  A little help with walking and/or transfers;A little help with bathing/dressing/bathroom;Assist for transportation   Equipment Recommendations  None recommended by OT    Recommendations for Other Services      Precautions / Restrictions Precautions Precautions: Fall Restrictions Weight Bearing Restrictions: Yes RLE Weight Bearing: Non weight bearing Other Position/Activity Restrictions: s/p R AKA 9/17       Mobility Bed Mobility               General bed mobility comments: NT, in w/c    Transfers                          Balance Overall balance assessment: Needs assistance Sitting-balance support: Single extremity supported Sitting balance-Leahy Scale: Good                                     ADL either performed or assessed with clinical judgement   ADL                         Lower Body Dressing Details (indicate cue type and reason): Pt reports having gotten his pants on at bed level without assist but very difficult and requiring additional time/effort to perform                    Extremity/Trunk Assessment              Vision       Perception     Praxis      Cognition Arousal/Alertness: Awake/alert Behavior During Therapy: WFL for tasks assessed/performed Overall Cognitive Status: Within Functional Limits for tasks assessed                                          Exercises Other Exercises Other Exercises: Pt educated in desensitization strategies and pain mgt strategies to support recovery, pt able to return demo  Shoulder Instructions       General Comments R AKA dressings in place    Pertinent Vitals/ Pain       Pain Assessment Pain Assessment: 0-10 Pain Score: 10-Worst pain ever Pain Location: R AKA surgical site Pain Descriptors / Indicators: Guarding, Grimacing, Tender, Shooting, Sharp Pain Intervention(s): Limited activity within patient's tolerance, Monitored during session, Repositioned, Patient requesting pain meds-RN notified  Home Living                                          Prior Functioning/Environment              Frequency  Min 2X/week        Progress Toward Goals  OT Goals(current goals can now be found in the care plan section)  Progress towards OT goals: Progressing toward goals  Acute Rehab OT Goals Patient Stated Goal: to return home and return to PLOF OT Goal Formulation: With patient Time For Goal Achievement: 04/04/22 Potential to Achieve Goals: Good   Plan Discharge plan remains appropriate;Frequency remains appropriate    Co-evaluation                 AM-PAC OT "6 Clicks" Daily Activity     Outcome Measure   Help from another person eating meals?: None Help from another person taking care of personal grooming?: A Little Help from another person toileting, which includes using toliet, bedpan, or urinal?: A Little Help from another person bathing (including washing, rinsing, drying)?: A Little Help from another person to put on and taking off regular upper body clothing?: None Help from another person to put on and taking off regular lower body clothing?: A Little 6 Click Score: 20    End of Session    OT Visit Diagnosis: Other abnormalities of gait and mobility (R26.89);Pain Pain - Right/Left: Right Pain - part of body: Leg   Activity Tolerance Patient tolerated treatment well;Patient limited by pain   Patient Left Other (comment) (in w/c, all needs in reach)   Nurse Communication          [id number] OT Time Calculation (min): 15 min  Charges: OT General Charges $OT Visit: 1 Visit OT Treatments $Therapeutic Activity: 8-22 mins  Ardeth Perfect., MPH, MS, OTR/L ascom [phone] 03/22/22, 5:07 PM

## 2022-03-22 NOTE — Hospital Course (Signed)
61 year old man with past medical history of BKA with nonhealing ulcer, diabetes, hyperlipidemia, hypertension and peripheral vascular disease.  He came in with right stump pain and cellulitis.  He was given Augmentin and an ER visit and came back with worsening pain.  MRI showed deep 5.6 cm fluid collection within the amputation stump consistent with abscess and tibial stump concerning for osteomyelitis.  Patient was started on aggressive IV antibiotics.  Dr. Lorenso Courier took to the operating room on 03/20/2022 for right AKA.  Case discussed with vascular surgery team here and this patient has been discharged from their practice and will have to follow-up at [facility].  Transitional care team will continue to set up her appointment at [facility].

## 2022-03-22 NOTE — TOC Progression Note (Addendum)
Transition of Care [facility]) - Progression Note    Patient Details  Name: [name] MRN: [id number] Date of Birth: October 20, 1960  Transition [facility]) CM/SW [place], RN Phone Number: 03/22/2022, 2:42 PM  Clinical Narrative:     Received a call from Kennyth Lose the patient's friend that he lives with, Requested a call back from me, I called Kennyth Lose and left a general VM  for a call back He called me again, I explained that I have to get permission to speak to him and give information.  I reached out to the patient to obtain permission to speak with Kennyth Lose I spoke with the patient and he gave permission to give any information to Kennyth Lose,   He has a follow up appointment at [facility] on Oct 3rd I called Kennyth Lose back at [phone] and left a general VM asking for A Call back    I spoke with Kennyth Lose  again, I let him know about the October appointment I explained that he can get Medicaid transportation The patient can set that up, I explained That [facility] will let them know if they can open the patient again He stated that he can npt get the patient to do what he is supposed to do,  I explained that the patient will have to get some of this done himself such as the transportation He agreed and said he would help the best he can with the patient and can change his dressing when needed as long as he knows what needs to be done  Barriers to Discharge: Continued Medical Work up  Expected Discharge Plan and Services     Discharge Planning Services: CM Consult                       DME Agency: NA                   Social Determinants of Health (SDOH) Interventions    Readmission Risk Interventions     No data to display

## 2022-03-22 NOTE — Progress Notes (Signed)
OT Cancellation Note  Patient Details Name: [name] MRN: [id number] DOB: 01-08-1961   Cancelled Treatment:    Reason Eval/Treat Not Completed: Other (comment). On 1st attempt, pt in the bathroom denies need for assist. On 2nd attempt, pt meeting with person in the room and unavailable for OT tx. Will re-attempt at later date/time as pt is available.   Ardeth Perfect., MPH, MS, OTR/L ascom [phone] 03/22/22, 2:22 PM

## 2022-03-22 NOTE — Plan of Care (Signed)
  Problem: Education: Goal: Knowledge of General Education information will improve Description: Including pain rating scale, medication(s)/side effects and non-pharmacologic comfort measures Outcome: Progressing   Problem: Health Behavior/Discharge Planning: Goal: Ability to manage health-related needs will improve Outcome: Progressing   Problem: Activity: Goal: Risk for activity intolerance will decrease Outcome: Progressing   Problem: Nutrition: Goal: Adequate nutrition will be maintained Outcome: Progressing   Problem: Coping: Goal: Level of anxiety will decrease Outcome: Progressing   Problem: Elimination: Goal: Will not experience complications related to bowel motility Outcome: Progressing Goal: Will not experience complications related to urinary retention Outcome: Progressing   Problem: Pain Managment: Goal: General experience of comfort will improve Outcome: Progressing   Problem: Safety: Goal: Ability to remain free from injury will improve Outcome: Progressing   

## 2022-03-22 NOTE — Assessment & Plan Note (Signed)
Increase MiraLAX to twice daily.  Given a dose of lactulose.

## 2022-03-22 NOTE — Progress Notes (Signed)
Patient is from home Home Equipment: Conservation officer, nature (2 wheels);Wheelchair - manual;Cane - single point;Shower seat;BSC/3in1;Hand held shower head;Hospital bed He is mobile with his wheelchair Having Medicaid has posed difficulty finding a home health agency to accept him, so far none will accept, Have contacted [facility], [facility], [facility], [facility], [facility]

## 2022-03-22 NOTE — Progress Notes (Addendum)
Progress Note   Patient: [name] BJS:[id number] DOB: 10-27-1960 DOA: 03/18/2022     4 DOS: the patient was seen and examined on 03/22/2022   Brief hospital course: 61 year old man with past medical history of BKA with nonhealing ulcer, diabetes, hyperlipidemia, hypertension and peripheral vascular disease.  He came in with right stump pain and cellulitis.  He was given Augmentin and an ER visit and came back with worsening pain.  MRI showed deep 5.6 cm fluid collection within the amputation stump consistent with abscess and tibial stump concerning for osteomyelitis.  Patient was started on aggressive IV antibiotics.  Dr. Lorenso Courier took to the operating room on 03/20/2022 for right AKA.  Case discussed with vascular surgery team here and this patient has been discharged from their practice and will have to follow-up at [facility].  Transitional care team will continue to set up her appointment at [facility].  Assessment and Plan: * Osteomyelitis ([place]) Osteomyelitis right tibial stump with large abscess.  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  With infection removed, I will discontinue antibiotics today.  Sepsis ruled out.  Cellulitis and abscess of right leg MRI showing deep abscess.  Patient has a nonhealing wound going on for months.  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  Since infection removed antibiotics discontinued today  Uncontrolled type 2 diabetes mellitus with hyperglycemia, with long-term current use of insulin (HCC) Hemoglobin A1c elevated at 8.1.  Increase Semglee insulin to 26 units at night.  Essential hypertension Continue metoprolol  Adjustment disorder with mixed anxiety and depressed mood Continue Zoloft  Constipation Increase MiraLAX to twice daily.  Given a dose of lactulose.  Hyponatremia Sodium now in the normal range.  Peripheral vascular disease ([place]) Patient was not taking Plavix at home.  Will restart aspirin tomorrow.  Hyperlipidemia Continue Lipitor  Thrombocytopenia (HCC) Platelet count  today 123.  Looking back at old labs the patient has had intermittent low platelet counts in the past.  Hepatitis C negative.        Subjective: Patient had some difficulty sleeping last night secondary to pain.  Had AKA done on 03/20/2022 by Dr. Lorenso Courier.  Physical Exam: Vitals:   03/21/22 2100 03/22/22 0604 03/22/22 0857 03/22/22 1546  BP: 130/83 133/79 (!) 152/93 135/85  Pulse: 63 83 91 81  Resp: 18 18 18    Temp: 97.8 F (36.6 C) 98.1 F (36.7 C) 98 F (36.7 C) 99.4 F (37.4 C)  TempSrc:  Oral    SpO2: 97% 100% 98% 97%  Weight:      Height:       Physical Exam HENT:     Head: Normocephalic.     Mouth/Throat:     Pharynx: No oropharyngeal exudate.  Eyes:     General: Lids are normal.     Conjunctiva/sclera: Conjunctivae normal.  Cardiovascular:     Rate and Rhythm: Normal rate and regular rhythm.     Heart sounds: Normal heart sounds, S1 normal and S2 normal.  Pulmonary:     Breath sounds: No decreased breath sounds, wheezing, rhonchi or rales.  Abdominal:     Palpations: Abdomen is soft.     Tenderness: There is no abdominal tenderness.  Musculoskeletal:     Left lower leg: No swelling.     Right Lower Extremity: Right leg is amputated above knee.  Skin:    Comments: Right leg stump wrapped.  Neurological:     Mental Status: He is alert and oriented to person, place, and time.     Data Reviewed:  White blood cell count 7.4, hemoglobin 14.4, platelet count 123  Family Communication: Declined  Disposition: Status is: Inpatient Remains inpatient appropriate because: Patient in quite a bit of pain last night and did not sleep.  Planned Discharge Destination: Home with Home Health    Time spent: 29 minutes Case discussed with Dr. Wyn Quaker vascular surgery and transitional care team  Author: Alford Highland, MD 03/22/2022 4:37 PM  For on call review [email or web address].

## 2022-03-22 NOTE — TOC Progression Note (Signed)
Transition of Care [facility]) - Progression Note    Patient Details  Name: [name] MRN: [id number] Date of Birth: [birth year]  Transition of Care [facility]) CM/SW [place], RN Phone Number: 03/22/2022, 12:19 PM  Clinical Narrative:     [facility] to get set up with [place] with Katharine Look,  Per Care Everywhere they were open with him in May She transferred me to Intake I asked for them to reopen the patient spoke with Roselyn Reef They would not accept any new referrals, fax number [phone] She stated I could fax over and they would take a look at it He will need an appointment in 3 weeks for suture removal from Vascular,  [facility] vascular surgery looked up the patient at my request and stated they have not seen him before  Called the office of [facility] where Loleta Books is listed, since she is in the chart with seeing the patient, they directed me to [place], [phone]  I called the hospital they found that the patient was seen at [place] called and requested an appointment for a follow up in 3 weeks They will call the patient with the appointment I provided the patient's number as well as Room phone number He will arrange transportation thru [facility] transportation      Barriers to Discharge: Continued Medical Work up  Expected Discharge Plan and Services     Discharge Planning Services: CM Consult                       DME Agency: NA                   Social Determinants of Health (SDOH) Interventions    Readmission Risk Interventions     No data to display

## 2022-03-23 DIAGNOSIS — M86161 Other acute osteomyelitis, right tibia and fibula: Secondary | ICD-10-CM | POA: Diagnosis not present

## 2022-03-23 LAB — BASIC METABOLIC PANEL
Anion gap: 4 — ABNORMAL LOW (ref 5–15)
BUN: 10 mg/dL (ref 8–23)
CO2: 29 mmol/L (ref 22–32)
Calcium: 8.9 mg/dL (ref 8.9–10.3)
Chloride: 104 mmol/L (ref 98–111)
Creatinine, Ser: 0.72 mg/dL (ref 0.61–1.24)
GFR, Estimated: >60 mL/min (ref >60)
Glucose, Bld: 142 mg/dL — ABNORMAL HIGH (ref 70–99)
Potassium: 3.9 mmol/L (ref 3.5–5.1)
Sodium: 137 mmol/L (ref 135–145)

## 2022-03-23 LAB — CULTURE, BLOOD (ROUTINE X 2): Culture: NO GROWTH

## 2022-03-23 LAB — GLUCOSE, CAPILLARY
Glucose-Capillary: 231 mg/dL — ABNORMAL HIGH (ref 70–99)
Glucose-Capillary: 154 mg/dL — ABNORMAL HIGH (ref 70–99)

## 2022-03-23 LAB — SURGICAL PATHOLOGY

## 2022-03-23 MED ORDER — HYDROCODONE-ACETAMINOPHEN 5-325 MG PO TABS: 1.0000 | ORAL_TABLET | ORAL | 0 refills | Status: AC | PRN

## 2022-03-23 MED ORDER — ASPIRIN 81 MG PO TBEC: 81.0000 mg | DELAYED_RELEASE_TABLET | Freq: Every day | ORAL | 12 refills | Status: AC

## 2022-03-23 NOTE — TOC Progression Note (Addendum)
Transition of Care [facility]) - Progression Note    Patient Details  Name: [name] MRN: [id number] Date of Birth: 1960/10/08  Transition [facility] CM/SW Contact  Conception Oms, RN Phone Number: 03/23/2022, 10:24 AM  Clinical Narrative:    Bosie Helper with [facility] health They will check to see if they can accept him and call me back  They called back and will accept him as a patient for [facility]   Barriers to Discharge: Continued Medical Work up  Expected Discharge Plan and Services     Discharge Planning Services: CM Consult                       DME Agency: NA                   Social Determinants of Health (SDOH) Interventions    Readmission Risk Interventions     No data to display

## 2022-03-23 NOTE — Discharge Summary (Signed)
Physician Discharge Summary  [name] IHK:[id number] DOB: 11-29-1960 DOA: 03/18/2022  PCP: Howard Pouch, NP  Admit date: 03/18/2022 Discharge date: 03/23/2022  Admitted From: Home Disposition:  Home with home health  Recommendations for Outpatient Follow-up:  Follow up with PCP in 1-2 weeks Follow up outpatient [facility] vascular surgery on 10/3  Home Health: Yes, PT OT RN aide Equipment/Devices: None  Discharge Condition: Stable CODE STATUS: Full Diet recommendation: Carb modified/heart healthy  Brief/Interim Summary: 61 year old man with past medical history of BKA with nonhealing ulcer, diabetes, hyperlipidemia, hypertension and peripheral vascular disease.  He came in with right stump pain and cellulitis.  He was given Augmentin and an ER visit and came back with worsening pain.  MRI showed deep 5.6 cm fluid collection within the amputation stump consistent with abscess and tibial stump concerning for osteomyelitis.  Patient was started on aggressive IV antibiotics.  Dr. Lorenso Courier took to the operating room on 03/20/2022 for right AKA.  Case discussed with vascular surgery team here and this patient has been discharged from their practice and will have to follow-up at [facility].  Transitional care team will continue to set up her appointment at [facility].  Wound was evaluated by myself and bedside RN at the day of discharge.  Some ecchymoses noted.  Wound intact with no dehiscence.  Sutures and staples are intact.  Case discussed with vascular surgery.  Patient has been discharged from their service and they will not consult however did provide recommendations regarding wound care.  These were appreciated.  Patient is medically stable for discharge at this time.  Infection control achieved so no indication for antibiotics at time of DC.  Patient encouraged to adhere to prescribed medications.  He has refills on all his chronic medications available at [facility].  Short course of narcotics provided  for pain control.  Patient has a scheduled follow-up with vascular surgery at [facility] on 10/3.  Clear to return to ED instructions have been provided.  Patient discharged in stable condition.  Considering history of poor follow-up, medical nonadherence patient will remain a high risk for hospital readmission.    Discharge Diagnoses:  Principal Problem:   Osteomyelitis ([place]) Active Problems:   Cellulitis and abscess of right leg   Essential hypertension   Uncontrolled type 2 diabetes mellitus with hyperglycemia, with long-term current use of insulin (HCC)   Adjustment disorder with mixed anxiety and depressed mood   Thrombocytopenia (HCC)   Hyperlipidemia   Peripheral vascular disease (HCC)   Hyponatremia   Constipation  * Osteomyelitis (HCC) Osteomyelitis right tibial stump with large abscess.  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  Infection control achieved.  Antibiotics discontinued.  Sepsis ruled out Patient to follow-up with vascular surgery at [facility] on 10/3.    Cellulitis and abscess of right leg MRI showing deep abscess.  Patient has a nonhealing wound going on for months.  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  Tolerated procedure well.  Postoperative pain mild to moderate.  Wound evaluated and pictures in chart on day of discharge.  Wound care instructions from vascular surgery appreciated.  Patient discharging home.  Clear return to ED instructions provided.  Patient will follow-up with [facility] vascular surgery.  On 10/3   Uncontrolled type 2 diabetes mellitus with hyperglycemia, with long-term current use of insulin (HCC) Hemoglobin A1c elevated at 8.1.  Medication adherence.  Suspect that the elevated A1c is due to medication nonadherence.   Essential hypertension Continue metoprolol   Adjustment disorder with mixed anxiety and depressed mood  Continue Zoloft   Constipation Increase MiraLAX to twice daily.  Given a dose of lactulose.  Resolved   Hyponatremia Sodium now in the normal range.    Peripheral vascular disease (HCC) Patient was not taking Plavix at home.  Encourage medication adherence and outpatient follow-up.  Aspirin prescribed on discharge.   Hyperlipidemia Continue Lipitor   Thrombocytopenia (HCC) Platelet count today 123.  Looking back at old labs the patient has had intermittent low platelet counts in the past.  Hepatitis C negative.  Discharge Instructions  Discharge Instructions     Diet - low sodium heart healthy   Complete by: As directed    Discharge wound care:   Complete by: As directed    Home health RN wound care.  Right AKA Stump.  Keep clean and dry.  Dressing changes with xeroform, kerlix, dry gauze.  Dressing change q2-3 days   Increase activity slowly   Complete by: As directed       Allergies as of 03/23/2022       Reactions   Erythromycin Base Anxiety   Azithromycin Anxiety   Xanax [alprazolam] Other (See Comments)   Tachycardia   Benzodiazepines    PT STATES HE CANNOT TAKE THESE BECAUSE THEY ALMOST KILLED HIM   Methocarbamol Anxiety, Other (See Comments)   Stomach cramping, weakness   Sulfa Antibiotics Nausea And Vomiting, Other (See Comments)   Cramping in stomach and hyperventilate.         Medication List     STOP taking these medications    ondansetron 4 MG disintegrating tablet Commonly known as: ZOFRAN-ODT   oxyCODONE 5 MG immediate release tablet Commonly known as: Roxicodone       TAKE these medications    aspirin EC 81 MG tablet Take 1 tablet (81 mg total) by mouth daily. Swallow whole. Start taking on: March 24, 2022   atorvastatin 20 MG tablet Commonly known as: LIPITOR Take 20 mg by mouth daily. What changed: Another medication with the same name was removed. Continue taking this medication, and follow the directions you see here.   clopidogrel 75 MG tablet Commonly known as: Plavix Take 1 tablet (75 mg total) by mouth daily.   cyclobenzaprine 10 MG tablet Commonly known as:  FLEXERIL Take 10 mg by mouth 3 (three) times daily as needed for muscle spasms.   gabapentin 300 MG capsule Commonly known as: NEURONTIN TAKE 1 CAPSULE BY MOUTH 3 TIMES A DAY   glucose blood test strip Commonly known as: True Metrix Blood Glucose Test Use as instructed   HYDROcodone-acetaminophen 5-325 MG tablet Commonly known as: NORCO/VICODIN Take 1 tablet by mouth every 4 (four) hours as needed for up to 7 days.   Lantus SoloStar 100 UNIT/ML Solostar Pen Generic drug: insulin glargine Inject 30 Units into the skin at bedtime.   metFORMIN 1000 MG tablet Commonly known as: GLUCOPHAGE TAKE ONE TABLET BY MOUTH 2 TIMES A DAY WITH A MEAL   metoprolol tartrate 50 MG tablet Commonly known as: LOPRESSOR TAKE 1 TABLET BY MOUTH 2 TIMES A DAY   mupirocin ointment 2 % Commonly known as: BACTROBAN Apply 1 application topically 2 (two) times daily.               Discharge Care Instructions  (From admission, onward)           Start     Ordered   03/23/22 0000  Discharge wound care:       Comments: Home health RN wound care.  Right AKA Stump.  Keep clean and dry.  Dressing changes with xeroform, kerlix, dry gauze.  Dressing change q2-3 days   03/23/22 1733            Follow-up Information     Jodi Marble, NP Follow up in 5 day(s).   Specialty: Nurse Practitioner Contact information: [address] [place] [address] [phone]                Allergies  Allergen Reactions   Erythromycin Base Anxiety   Azithromycin Anxiety   Xanax [Alprazolam] Other (See Comments)    Tachycardia   Benzodiazepines     PT STATES HE CANNOT TAKE THESE BECAUSE THEY ALMOST KILLED HIM   Methocarbamol Anxiety and Other (See Comments)    Stomach cramping, weakness   Sulfa Antibiotics Nausea And Vomiting and Other (See Comments)    Cramping in stomach and hyperventilate.     Consultations: Vascular surgery   Procedures/Studies: MR TIBIA FIBULA RIGHT WO  CONTRAST  Result Date: 03/19/2022 CLINICAL DATA:  Pain and swelling and right BKA stump. EXAM: MRI OF LOWER RIGHT EXTREMITY WITHOUT CONTRAST TECHNIQUE: Multiplanar, multisequence MR imaging of the right lower leg was performed. No intravenous contrast was administered. COMPARISON:  Right knee x-rays from yesterday. FINDINGS: Limited study. Only 3 sequences were obtained as the patient declined further examination due to pain. Bones/Joint/Cartilage Prior right below-knee amputation. Mild marrow edema in the residual tibia at the amputation site (series 5, image 23), which closely approximates the skin surface anteriorly (series 6, image 38). Degenerative changes of both knees. Muscles and Tendons Confluent edema within the residual muscles of the right lower leg, most consistent with denervation. Soft tissue Diffuse soft tissue swelling of the amputation stump and left lower leg. Deep 5.6 cm fluid collection within the amputation stump adjacent to the residual tibia (series 5, image 25), containing tiny foci of air. This may communicate with the skin surface anteriorly. IMPRESSION: 1. Prior right below-knee amputation with mild marrow edema in the tibial stump, which closely approximates the skin surface anteriorly. This is concerning for osteomyelitis. 2. Deep 5.6 cm abscess within the amputation stump adjacent to the residual tibia, which may also communicate with the skin surface anteriorly. Electronically Signed   By: Obie Dredge M.D.   On: 03/19/2022 12:35   DG Chest Port 1 View  Result Date: 03/18/2022 CLINICAL DATA:  Possible sepsis.  Right lower extremity cellulitis. EXAM: PORTABLE CHEST 1 VIEW COMPARISON:  09/30/2018 FINDINGS: Lungs are adequately inflated without focal airspace consolidation or effusion. Cardiomediastinal silhouette and remainder of the exam is unchanged. IMPRESSION: No active disease. Electronically Signed   By: Elberta Fortis M.D.   On: 03/18/2022 12:00   DG Knee Complete 4  Views Right  Result Date: 03/18/2022 CLINICAL DATA:  Sepsis/cellulitis.  BKA with pain near stump. EXAM: RIGHT KNEE - COMPLETE 4+ VIEW COMPARISON:  03/07/2022 FINDINGS: Evidence of patient's below-knee amputation. Superficial focal area of low-density within the soft tissues adjacent the anterior aspect of the tibial stump without significant change. This may represent the site of patient's soft tissue infection no evidence of underlying bone destruction to suggest osteomyelitis. Remainder of the exam is unchanged. IMPRESSION: Superficial area of low-density within the soft tissues adjacent the anterior aspect of the tibial stump without significant change likely the site of patient's soft tissue infection. No evidence of underlying bone destruction to suggest osteomyelitis. Electronically Signed   By: Elberta Fortis M.D.   On: 03/18/2022 12:00  DG Knee Complete 4 Views Right  Result Date: 03/07/2022 CLINICAL DATA:  Right amputation stump with open wound evaluate for osteomyelitis EXAM: RIGHT KNEE - COMPLETE 4+ VIEW COMPARISON:  None Available. FINDINGS: Status post right below-knee amputation. No evidence of bony erosion or sclerosis of the stump. Soft tissue wound and edema. Popliteal artery stent. Mild tricompartmental arthrosis. IMPRESSION: Status post right below-knee amputation. No evidence of bony erosion or sclerosis of the stump. Soft tissue wound and edema. Contrast enhanced MRI is the most sensitive test for the evaluation of known or suspected osteomyelitis. Electronically Signed   By: Jearld Lesch M.D.   On: 03/07/2022 17:06      Subjective: Seen and examined on the day of discharge.  Pain well controlled.  Stable no distress.  Appropriate for discharge home with home health services.  Discharge Exam: Vitals:   03/23/22 0751 03/23/22 1536  BP: 124/76 (!) 144/91  Pulse: 72 69  Resp:    Temp: 98.9 F (37.2 C) 97.9 F (36.6 C)  SpO2: 94% 99%   Vitals:   03/22/22 2106 03/23/22 0506  03/23/22 0751 03/23/22 1536  BP: (!) 146/76 131/73 124/76 (!) 144/91  Pulse: 83 79 72 69  Resp: 20 20    Temp: 98.3 F (36.8 C) 98.3 F (36.8 C) 98.9 F (37.2 C) 97.9 F (36.6 C)  TempSrc: Oral  Oral   SpO2: 99% 99% 94% 99%  Weight:      Height:        General: Pt is alert, awake, not in acute distress Cardiovascular: RRR, S1/S2 +, no rubs, no gallops Respiratory: CTA bilaterally, no wheezing, no rhonchi Abdominal: Soft, NT, ND, bowel sounds + Extremities: Right AKA    The results of significant diagnostics from this hospitalization (including imaging, microbiology, ancillary and laboratory) are listed below for reference.     Microbiology: Recent Results (from the past 240 hour(s))  Urine Culture     Status: None   Collection Time: 03/18/22 10:27 AM   Specimen: In/Out Cath Urine  Result Value Ref Range Status   Specimen Description   Final    IN/OUT CATH URINE Performed at [facility], [address], [place], [place] [address]    Special Requests   Final    NONE Performed at [facility], [address] [place], [place] [address]    Culture   Final    NO GROWTH Performed at [facility], [address] [place], [place] [address]    Report Status 03/19/2022 FINAL  Final  Resp Panel by RT-PCR (Flu A&B, Covid) Anterior Nasal Swab     Status: None   Collection Time: 03/18/22 11:20 AM   Specimen: Anterior Nasal Swab  Result Value Ref Range Status   SARS Coronavirus 2 by RT PCR NEGATIVE NEGATIVE Final    Comment: (NOTE) SARS-CoV-2 target nucleic acids are NOT DETECTED.  The SARS-CoV-2 RNA is generally detectable in upper respiratory specimens during the acute phase of infection. The lowest concentration of SARS-CoV-2 viral copies this assay can detect is 138 copies/mL. A negative result does not preclude SARS-Cov-2 infection and should not be used as the sole basis for treatment or other patient management decisions. A negative result  may occur with  improper specimen collection/handling, submission of specimen other than nasopharyngeal swab, presence of viral mutation(s) within the areas targeted by this assay, and inadequate number of viral copies(<138 copies/mL). A negative result must be combined with clinical observations, patient history, and epidemiological  information. The expected result is Negative.  Fact Sheet for Patients:  BloggerCourse.com  Fact Sheet for Healthcare Providers:  SeriousBroker.it  This test is no t yet approved or cleared by the [place] FDA and  has been authorized for detection and/or diagnosis of SARS-CoV-2 by FDA under an Emergency Use Authorization (EUA). This EUA will remain  in effect (meaning this test can be used) for the duration of the COVID-19 declaration under Section 564(b)(1) of the Act, 21 U.S.C.section 360bbb-3(b)(1), unless the authorization is terminated  or revoked sooner.       Influenza A by PCR NEGATIVE NEGATIVE Final   Influenza B by PCR NEGATIVE NEGATIVE Final    Comment: (NOTE) The Xpert Xpress SARS-CoV-2/FLU/RSV plus assay is intended as an aid in the diagnosis of influenza from Nasopharyngeal swab specimens and should not be used as a sole basis for treatment. Nasal washings and aspirates are unacceptable for Xpert Xpress SARS-CoV-2/FLU/RSV testing.  Fact Sheet for Patients: BloggerCourse.com  Fact Sheet for Healthcare Providers: SeriousBroker.it  This test is not yet approved or cleared by the [place] FDA and has been authorized for detection and/or diagnosis of SARS-CoV-2 by FDA under an Emergency Use Authorization (EUA). This EUA will remain in effect (meaning this test can be used) for the duration of the COVID-19 declaration under Section 564(b)(1) of the Act, 21 U.S.C. section 360bbb-3(b)(1), unless the authorization is terminated  or revoked.  Performed at [facility], [address], [place], [place] [address]   Blood Culture (routine x 2)     Status: None   Collection Time: 03/18/22 11:20 AM   Specimen: BLOOD  Result Value Ref Range Status   Specimen Description   Final    BLOOD LEFT ANTECUBITAL Performed at [facility], [address] [place], [place] [address]    Special Requests   Final    BOTTLES DRAWN AEROBIC AND ANAEROBIC Blood Culture results may not be optimal due to an excessive volume of blood received in culture bottles Performed at [facility], [address], [place], [place] [address]    Culture   Final    NO GROWTH 5 DAYS Performed at [facility], [address], [place], [place] [address]    Report Status 03/23/2022 FINAL  Final  Blood Culture (routine x 2)     Status: None (Preliminary result)   Collection Time: 03/18/22 11:25 AM   Specimen: BLOOD  Result Value Ref Range Status   Specimen Description BLOOD RIGHT ANTECUBITAL  Final   Special Requests   Final    BOTTLES DRAWN AEROBIC AND ANAEROBIC Blood Culture adequate volume   Culture   Final    NO GROWTH 4 DAYS Performed at [facility], [address], [place], [place] [address]    Report Status PENDING  Incomplete  Aerobic/Anaerobic Culture w Gram Stain (surgical/deep wound)     Status: None (Preliminary result)   Collection Time: 03/20/22 10:41 AM   Specimen: PATH Other; Abscess  Result Value Ref Range Status   Specimen Description   Final    OTHER Performed at [facility], [address], [place], [place] [address]    Special Requests   Final    NONE Performed at [facility], [address] [place], [place] [address]    Gram Stain   Final    FEW WBC PRESENT, PREDOMINANTLY MONONUCLEAR FEW GRAM POSITIVE COCCI IN PAIRS FEW GRAM NEGATIVE RODS FEW GRAM POSITIVE COCCI IN CLUSTERS  Performed at [facility], [address], [place], [place]  [address]    Culture   Final    MODERATE SERRATIA MARCESCENS MODERATE STAPHYLOCOCCUS EPIDERMIDIS NO ANAEROBES ISOLATED; CULTURE IN PROGRESS FOR 5 DAYS    Report Status PENDING  Incomplete   Organism ID, Bacteria SERRATIA MARCESCENS  Final   Organism ID, Bacteria STAPHYLOCOCCUS EPIDERMIDIS  Final      Susceptibility   Staphylococcus epidermidis - MIC*    CIPROFLOXACIN <=0.5 SENSITIVE Sensitive     ERYTHROMYCIN >=8 RESISTANT Resistant     GENTAMICIN <=0.5 SENSITIVE Sensitive     OXACILLIN >=4 RESISTANT Resistant     TETRACYCLINE >=16 RESISTANT Resistant     VANCOMYCIN 1 SENSITIVE Sensitive     TRIMETH/SULFA >=320 RESISTANT Resistant     CLINDAMYCIN <=0.25 SENSITIVE Sensitive     RIFAMPIN <=0.5 SENSITIVE Sensitive     Inducible Clindamycin NEGATIVE Sensitive     * MODERATE STAPHYLOCOCCUS EPIDERMIDIS   Serratia marcescens - MIC*    CEFAZOLIN >=64 RESISTANT Resistant     CEFEPIME <=0.12 SENSITIVE Sensitive     CEFTAZIDIME <=1 SENSITIVE Sensitive     CEFTRIAXONE <=0.25 SENSITIVE Sensitive     CIPROFLOXACIN <=0.25 SENSITIVE Sensitive     GENTAMICIN <=1 SENSITIVE Sensitive     TRIMETH/SULFA <=20 SENSITIVE Sensitive     * MODERATE SERRATIA MARCESCENS     Labs: BNP (last 3 results) No results for input(s): "BNP" in the last 8760 hours. Basic Metabolic Panel: Recent Labs  Lab 03/18/22 1027 03/20/22 0402 03/21/22 0432 03/23/22 0624  NA 134* 140 136 137  K 4.4 4.2 4.0 3.9  CL 100 105 104 104  CO2 GLUCOSE 335* 166* 231* 142*  BUN CREATININE 0.75 0.88 0.84 0.72  CALCIUM 9.4 9.0 8.9 8.9   Liver Function Tests: Recent Labs  Lab 03/18/22 1027  AST 17  ALT 20  ALKPHOS 74  BILITOT 0.7  PROT 6.7  ALBUMIN 3.8   No results for input(s): "LIPASE", "AMYLASE" in the last 168 hours. No results for input(s): "AMMONIA" in the last 168 hours. CBC: Recent Labs  Lab 03/18/22 1027 03/20/22 0402 03/21/22 0432 03/22/22 0347  WBC 7.1 7.0 8.0 7.4  NEUTROABS  4.4  --   --   --   HGB 16.3 15.2 14.5 14.4  HCT 47.5 45.2 43.3 43.1  MCV 94.1 95.6 95.2 96.4  PLT 186 151 135* 123*   Cardiac Enzymes: No results for input(s): "CKTOTAL", "CKMB", "CKMBINDEX", "TROPONINI" in the last 168 hours. BNP: Invalid input(s): "POCBNP" CBG: Recent Labs  Lab 03/22/22 1213 03/22/22 1623 03/22/22 2107 03/23/22 0752 03/23/22 1159  GLUCAP 190* 175* 240* 231* 154*   D-Dimer No results for input(s): "DDIMER" in the last 72 hours. Hgb A1c No results for input(s): "HGBA1C" in the last 72 hours. Lipid Profile No results for input(s): "CHOL", "HDL", "LDLCALC", "TRIG", "CHOLHDL", "LDLDIRECT" in the last 72 hours. Thyroid function studies No results for input(s): "TSH", "T4TOTAL", "T3FREE", "THYROIDAB" in the last 72 hours.  Invalid input(s): "FREET3" Anemia work up No results for input(s): "VITAMINB12", "FOLATE", "FERRITIN", "TIBC", "IRON", "RETICCTPCT" in the last 72 hours. Urinalysis    Component Value Date/Time   COLORURINE STRAW (A) 03/18/2022 1027   APPEARANCEUR CLEAR (A) 03/18/2022 1027   APPEARANCEUR Clear 09/18/2019 1221   LABSPEC 1.008 03/18/2022 1027   LABSPEC 1.015 04/13/2012 1110   PHURINE 5.0 03/18/2022 1027   GLUCOSEU >=500 (A) 03/18/2022 1027  GLUCOSEU Negative 04/13/2012 1110   HGBUR NEGATIVE 03/18/2022 1027   BILIRUBINUR NEGATIVE 03/18/2022 1027   BILIRUBINUR Negative 09/18/2019 1221   BILIRUBINUR Negative 04/13/2012 1110   KETONESUR NEGATIVE 03/18/2022 1027   PROTEINUR NEGATIVE 03/18/2022 1027   NITRITE NEGATIVE 03/18/2022 1027   LEUKOCYTESUR NEGATIVE 03/18/2022 1027   LEUKOCYTESUR Negative 04/13/2012 1110   Sepsis Labs Recent Labs  Lab 03/18/22 1027 03/20/22 0402 03/21/22 0432 03/22/22 0347  WBC 7.1 7.0 8.0 7.4   Microbiology Recent Results (from the past 240 hour(s))  Urine Culture     Status: None   Collection Time: 03/18/22 10:27 AM   Specimen: In/Out Cath Urine  Result Value Ref Range Status   Specimen Description    Final    IN/OUT CATH URINE Performed at [facility], [address], [place], [place] [address]    Special Requests   Final    NONE Performed at [facility], [address], [place], [place] [address]    Culture   Final    NO GROWTH Performed at [facility], [address] [place], [place] [address]    Report Status 03/19/2022 FINAL  Final  Resp Panel by RT-PCR (Flu A&B, Covid) Anterior Nasal Swab     Status: None   Collection Time: 03/18/22 11:20 AM   Specimen: Anterior Nasal Swab  Result Value Ref Range Status   SARS Coronavirus 2 by RT PCR NEGATIVE NEGATIVE Final    Comment: (NOTE) SARS-CoV-2 target nucleic acids are NOT DETECTED.  The SARS-CoV-2 RNA is generally detectable in upper respiratory specimens during the acute phase of infection. The lowest concentration of SARS-CoV-2 viral copies this assay can detect is 138 copies/mL. A negative result does not preclude SARS-Cov-2 infection and should not be used as the sole basis for treatment or other patient management decisions. A negative result may occur with  improper specimen collection/handling, submission of specimen other than nasopharyngeal swab, presence of viral mutation(s) within the areas targeted by this assay, and inadequate number of viral copies(<138 copies/mL). A negative result must be combined with clinical observations, patient history, and epidemiological information. The expected result is Negative.  Fact Sheet for Patients:  BloggerCourse.com  Fact Sheet for Healthcare Providers:  SeriousBroker.it  This test is no t yet approved or cleared by the [place] FDA and  has been authorized for detection and/or diagnosis of SARS-CoV-2 by FDA under an Emergency Use Authorization (EUA). This EUA will remain  in effect (meaning this test can be used) for the duration of the COVID-19 declaration under Section 564(b)(1) of  the Act, 21 U.S.C.section 360bbb-3(b)(1), unless the authorization is terminated  or revoked sooner.       Influenza A by PCR NEGATIVE NEGATIVE Final   Influenza B by PCR NEGATIVE NEGATIVE Final    Comment: (NOTE) The Xpert Xpress SARS-CoV-2/FLU/RSV plus assay is intended as an aid in the diagnosis of influenza from Nasopharyngeal swab specimens and should not be used as a sole basis for treatment. Nasal washings and aspirates are unacceptable for Xpert Xpress SARS-CoV-2/FLU/RSV testing.  Fact Sheet for Patients: BloggerCourse.com  Fact Sheet for Healthcare Providers: SeriousBroker.it  This test is not yet approved or cleared by the [place] FDA and has been authorized for detection and/or diagnosis of SARS-CoV-2 by FDA under an Emergency Use Authorization (EUA). This EUA will remain in effect (meaning this test can be used) for the duration of the COVID-19 declaration under Section 564(b)(1) of the Act, 21 U.S.C. section  360bbb-3(b)(1), unless the authorization is terminated or revoked.  Performed at [facility], [address] [place], [place] [address]   Blood Culture (routine x 2)     Status: None   Collection Time: 03/18/22 11:20 AM   Specimen: BLOOD  Result Value Ref Range Status   Specimen Description   Final    BLOOD LEFT ANTECUBITAL Performed at [facility], [address] [place], [place] [address]    Special Requests   Final    BOTTLES DRAWN AEROBIC AND ANAEROBIC Blood Culture results may not be optimal due to an excessive volume of blood received in culture bottles Performed at [facility], [address], [place], [place] [address]    Culture   Final    NO GROWTH 5 DAYS Performed at [facility], [address], [place], [place] [address]    Report Status 03/23/2022 FINAL  Final  Blood Culture (routine x 2)     Status: None (Preliminary result)   Collection  Time: 03/18/22 11:25 AM   Specimen: BLOOD  Result Value Ref Range Status   Specimen Description BLOOD RIGHT ANTECUBITAL  Final   Special Requests   Final    BOTTLES DRAWN AEROBIC AND ANAEROBIC Blood Culture adequate volume   Culture   Final    NO GROWTH 4 DAYS Performed at [facility], [address] [place], [place] [address]    Report Status PENDING  Incomplete  Aerobic/Anaerobic Culture w Gram Stain (surgical/deep wound)     Status: None (Preliminary result)   Collection Time: 03/20/22 10:41 AM   Specimen: PATH Other; Abscess  Result Value Ref Range Status   Specimen Description   Final    OTHER Performed at [facility], [address] [place], [place] [address]    Special Requests   Final    NONE Performed at [facility], [address] [place], [place] [address]    Gram Stain   Final    FEW WBC PRESENT, PREDOMINANTLY MONONUCLEAR FEW GRAM POSITIVE COCCI IN PAIRS FEW GRAM NEGATIVE RODS FEW GRAM POSITIVE COCCI IN CLUSTERS Performed at [facility], [address], [place], [place] [address]    Culture   Final    MODERATE SERRATIA MARCESCENS MODERATE STAPHYLOCOCCUS EPIDERMIDIS NO ANAEROBES ISOLATED; CULTURE IN PROGRESS FOR 5 DAYS    Report Status PENDING  Incomplete   Organism ID, Bacteria SERRATIA MARCESCENS  Final   Organism ID, Bacteria STAPHYLOCOCCUS EPIDERMIDIS  Final      Susceptibility   Staphylococcus epidermidis - MIC*    CIPROFLOXACIN <=0.5 SENSITIVE Sensitive     ERYTHROMYCIN >=8 RESISTANT Resistant     GENTAMICIN <=0.5 SENSITIVE Sensitive     OXACILLIN >=4 RESISTANT Resistant     TETRACYCLINE >=16 RESISTANT Resistant     VANCOMYCIN 1 SENSITIVE Sensitive     TRIMETH/SULFA >=320 RESISTANT Resistant     CLINDAMYCIN <=0.25 SENSITIVE Sensitive     RIFAMPIN <=0.5 SENSITIVE Sensitive     Inducible Clindamycin NEGATIVE Sensitive     * MODERATE STAPHYLOCOCCUS EPIDERMIDIS   Serratia marcescens - MIC*    CEFAZOLIN >=64  RESISTANT Resistant     CEFEPIME <=0.12 SENSITIVE Sensitive     CEFTAZIDIME <=1 SENSITIVE Sensitive     CEFTRIAXONE <=0.25 SENSITIVE Sensitive     CIPROFLOXACIN <=0.25 SENSITIVE Sensitive     GENTAMICIN <=1 SENSITIVE Sensitive     TRIMETH/SULFA <=20 SENSITIVE Sensitive     * MODERATE SERRATIA MARCESCENS  Time coordinating discharge: Over 30 minutes  SIGNED:   Tresa Moore, MD  [facility] 03/23/2022, 5:37 PM Pager   If 7PM-7AM, please contact night-coverage

## 2022-03-23 NOTE — Progress Notes (Signed)
Occupational Therapy Treatment Patient Details Name: [name] MRN: [id number] DOB: 05-04-1961 Today's Date: 03/23/2022   History of present illness Pt is a 61 y.o. male presenting to hospital 9/15 with c/o continued R stump pain (h/o R BKA), and muscle spasms.  Recent ED visit for cellulitis R LE amputation site.  Pt now admitted with cellulitis and abscess of R leg, sepsis d/t cellulitis, and osteomyelitis.  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  PMH includes R BKA May '23 in [place], [place], htn, and PVD.   OT comments  Patient in wheelchair upon arrival and agreeable to OT services. Patient was able to perform chair push ups x3, transfers sit <> stand with supervision. No LOB observed while standing; Patient was able to perform LB dressing with supervision; lateral leans performed with supervision. Patient with c/o ace wrap being tight and uncomfortable (tourniquet wrap) Therapist attempted to correct ace wrap in a figure 8, but patient refused, stating "I am leaving today and this will hinder my discharge. I'm ready to go home." Therapist demonstrated figure 8 wrap and educated patient on benefits of the figure 8 wrap. Patient declined to reciprocate demonstration. Nurse and MD notified. Pt left in [facility] with all needs met.     Recommendations for follow up therapy are one component of a multi-disciplinary discharge planning process, led by the attending physician.  Recommendations may be updated based on patient status, additional functional criteria and insurance authorization.    Follow Up Recommendations  Home health OT    Assistance Recommended at Discharge Intermittent Supervision/Assistance  Patient can return home with the following  A little help with walking and/or transfers;A little help with bathing/dressing/bathroom;Assist for transportation   Equipment Recommendations  None recommended by OT       Precautions / Restrictions Precautions Precautions: Fall Restrictions Weight Bearing  Restrictions: Yes RLE Weight Bearing: Non weight bearing Other Position/Activity Restrictions: s/p R AKA 9/17       Mobility Bed Mobility               General bed mobility comments: NT, in w/c    Transfers Overall transfer level: Modified independent Equipment used: Pushed w/c                     Balance Overall balance assessment: Needs assistance Sitting-balance support: Feet supported, Single extremity supported Sitting balance-Leahy Scale: Good     Standing balance support: Single extremity supported Standing balance-Leahy Scale: Good Standing balance comment: steady standing managing pants for Lb dressing (single UE support on wheel chair handle  for balance with opposite UE managing pants)                           ADL either performed or assessed with clinical judgement   ADL                       Lower Body Dressing: Supervision/safety;Sit to/from stand;Sitting/lateral leans                      Extremity/Trunk Assessment Upper Extremity Assessment Upper Extremity Assessment: Overall WFL for tasks assessed   Lower Extremity Assessment Lower Extremity Assessment: RLE deficits/detail        Vision       Perception     Praxis      Cognition Arousal/Alertness: Awake/alert Behavior During Therapy: WFL for tasks assessed/performed Overall Cognitive Status: Within Functional Limits for  tasks assessed                                                General Comments R AKA dressing in place. Pt reports very tight; would not allow anyone to remove dressing to properly figure 8 wrap.    Pertinent Vitals/ Pain       Pain Assessment Pain Assessment: Faces Faces Pain Scale: Hurts even more Pain Location: R AKA surgical site Pain Descriptors / Indicators: Guarding, Grimacing, Tender, Shooting, Sharp  Home Living                                          Prior  Functioning/Environment              Frequency  Min 2X/week        Progress Toward Goals  OT Goals(current goals can now be found in the care plan section)  Progress towards OT goals: Progressing toward goals  Acute Rehab OT Goals Patient Stated Goal: to return home. OT Goal Formulation: With patient Time For Goal Achievement: 04/04/22 Potential to Achieve Goals: Good  Plan Discharge plan remains appropriate;Frequency remains appropriate       AM-PAC OT "6 Clicks" Daily Activity     Outcome Measure   Help from another person eating meals?: None Help from another person taking care of personal grooming?: A Little Help from another person toileting, which includes using toliet, bedpan, or urinal?: A Little Help from another person bathing (including washing, rinsing, drying)?: A Little Help from another person to put on and taking off regular upper body clothing?: None Help from another person to put on and taking off regular lower body clothing?: A Little 6 Click Score: 20    End of Session Equipment Utilized During Treatment:  (wheelchair)  OT Visit Diagnosis: Other abnormalities of gait and mobility (R26.89);Pain Pain - Right/Left: Right Pain - part of body: Leg   Activity Tolerance Patient tolerated treatment well;Patient limited by pain   Patient Left     Nurse Communication Mobility status;Other (comment) (limb wrapping issue.)        Time: 1282-0813 OT Time Calculation (min): 24 min  Charges: OT General Charges $OT Visit: 1 Visit OT Treatments $Self Care/Home Management : 8-22 mins $Therapeutic Activity: 8-22 mins    Lakresha Stifter, OTS 03/23/2022, 12:59 PM

## 2022-03-23 NOTE — Progress Notes (Signed)
Occupational Therapy Treatment Patient Details Name: [name] MRN: [id number] DOB: 1961-05-19 Today's Date: 03/23/2022   History of present illness Pt is a 61 y.o. male presenting to hospital 9/15 with c/o continued R stump pain (h/o R BKA), and muscle spasms.  Recent ED visit for cellulitis R LE amputation site.  Pt now admitted with cellulitis and abscess of R leg, sepsis d/t cellulitis, and osteomyelitis.  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  PMH includes R BKA May '23 in [place], [name], htn, and PVD.   OT comments  Ot notified via secure chat that pt is now agreeable to limb wrapping after being educated by physician. OT arriving to room and pt seated on EOB requesting to use bathroom. Squat pivot transfer from bed >wheelchair. Pt propels self to bathroom at mod I level and set up transfer to toilet independently. Pt able to manage LB clothing items and hygiene without assistance and returns back to wheelchair and back to bed for RN to remove dressing from R residual limb. Pt reporting increased pain and requesting to remove himself and dons gloves to remove wrap with assistance from RN in preparation for therapist to assist with limb wrapping. Once dressing removed, pt noted to have active bleeding and RN notifying MD of concern. RN seeking further instruction in regards to wound and therefore limb wrapping not completed this session. OT exiting the room at this time.    Recommendations for follow up therapy are one component of a multi-disciplinary discharge planning process, led by the attending physician.  Recommendations may be updated based on patient status, additional functional criteria and insurance authorization.    Follow Up Recommendations  Home health OT    Assistance Recommended at Discharge Intermittent Supervision/Assistance  Patient can return home with the following  A little help with walking and/or transfers;A little help with bathing/dressing/bathroom;Assist for transportation    Equipment Recommendations  None recommended by OT       Precautions / Restrictions Precautions Precautions: Fall Restrictions Weight Bearing Restrictions: Yes RLE Weight Bearing: Non weight bearing Other Position/Activity Restrictions: s/p R AKA 9/17       Mobility Bed Mobility Overal bed mobility: Modified Independent                  Transfers Overall transfer level: Modified independent Equipment used: None               General transfer comment: squat pivot from wheelchair <>bed and w/c<>toilet     Balance Overall balance assessment: Needs assistance Sitting-balance support: Single extremity supported, Feet supported Sitting balance-Leahy Scale: Normal Sitting balance - Comments: steady sitting reaching within BOS   Standing balance support: Single extremity supported                               ADL either performed or assessed with clinical judgement   ADL Overall ADL's : Modified independent                     Lower Body Dressing: Supervision/safety;Sit to/from stand;Sitting/lateral leans   Toilet Transfer: Modified Marine scientist Details (indicate cue type and reason): squat pivot from wheelchair <>toilet Toileting- Clothing Manipulation and Hygiene: Modified independent       Functional mobility during ADLs: Modified independent;Wheelchair      Extremity/Trunk Assessment Upper Extremity Assessment Upper Extremity Assessment: Overall WFL for tasks assessed   Lower Extremity Assessment Lower Extremity Assessment:  RLE deficits/detail         Cognition Arousal/Alertness: Awake/alert Behavior During Therapy: WFL for tasks assessed/performed Overall Cognitive Status: Within Functional Limits for tasks assessed                                                General Comments R AKA dressing in place. Pt reports very tight; would not allow anyone to remove dressing to  properly figure 8 wrap.    Pertinent Vitals/ Pain       Pain Assessment Pain Assessment: Faces Faces Pain Scale: Hurts even more Pain Location: R AKA surgical site Pain Descriptors / Indicators: Guarding, Grimacing, Tender, Shooting Pain Intervention(s): Monitored during session, Repositioned         Frequency  Min 2X/week        Progress Toward Goals  OT Goals(current goals can now be found in the care plan section)  Progress towards OT goals: Progressing toward goals  Acute Rehab OT Goals Patient Stated Goal: to go home OT Goal Formulation: With patient Time For Goal Achievement: 04/04/22 Potential to Achieve Goals: Good  Plan Discharge plan remains appropriate;Frequency remains appropriate       AM-PAC OT "6 Clicks" Daily Activity     Outcome Measure   Help from another person eating meals?: None Help from another person taking care of personal grooming?: A Little Help from another person toileting, which includes using toliet, bedpan, or urinal?: A Little Help from another person bathing (including washing, rinsing, drying)?: A Little Help from another person to put on and taking off regular upper body clothing?: None Help from another person to put on and taking off regular lower body clothing?: A Little 6 Click Score: 20    End of Session Equipment Utilized During Treatment:  (wheelchair)  OT Visit Diagnosis: Other abnormalities of gait and mobility (R26.89);Pain Pain - Right/Left: Right Pain - part of body: Leg   Activity Tolerance Patient limited by pain;Treatment limited secondary to medical complications (Comment)   Patient Left in bed;with call bell/phone within reach;with nursing/sitter in room   Nurse Communication Mobility status;Other (comment) (MD notified of active bleeding)        Time: 1937-9024 OT Time Calculation (min): 23 min  Charges: OT General Charges $OT Visit: 1 Visit OT Treatments $Self Care/Home Management : 8-22  mins $Therapeutic Activity: 8-22 mins  Darleen Crocker, MS, OTR/L , CBIS ascom [phone]  03/23/22, 3:31 PM

## 2022-03-25 LAB — AEROBIC/ANAEROBIC CULTURE W GRAM STAIN (SURGICAL/DEEP WOUND)

## 2022-03-26 LAB — CULTURE, BLOOD (ROUTINE X 2)
Culture: NO GROWTH
Special Requests: ADEQUATE

## 2022-05-02 ENCOUNTER — Encounter (INDEPENDENT_AMBULATORY_CARE_PROVIDER_SITE_OTHER): Payer: Self-pay

## 2022-07-03 IMAGING — DX DG FOOT 2V*R*
2 series · 2 of 2 positions shown · non-contrast
Comparison: Radiograph 08/15/2020

CLINICAL DATA: Right foot ulcer.  Foot pain.

EXAM:
RIGHT FOOT - 2 VIEW

[foot ap]
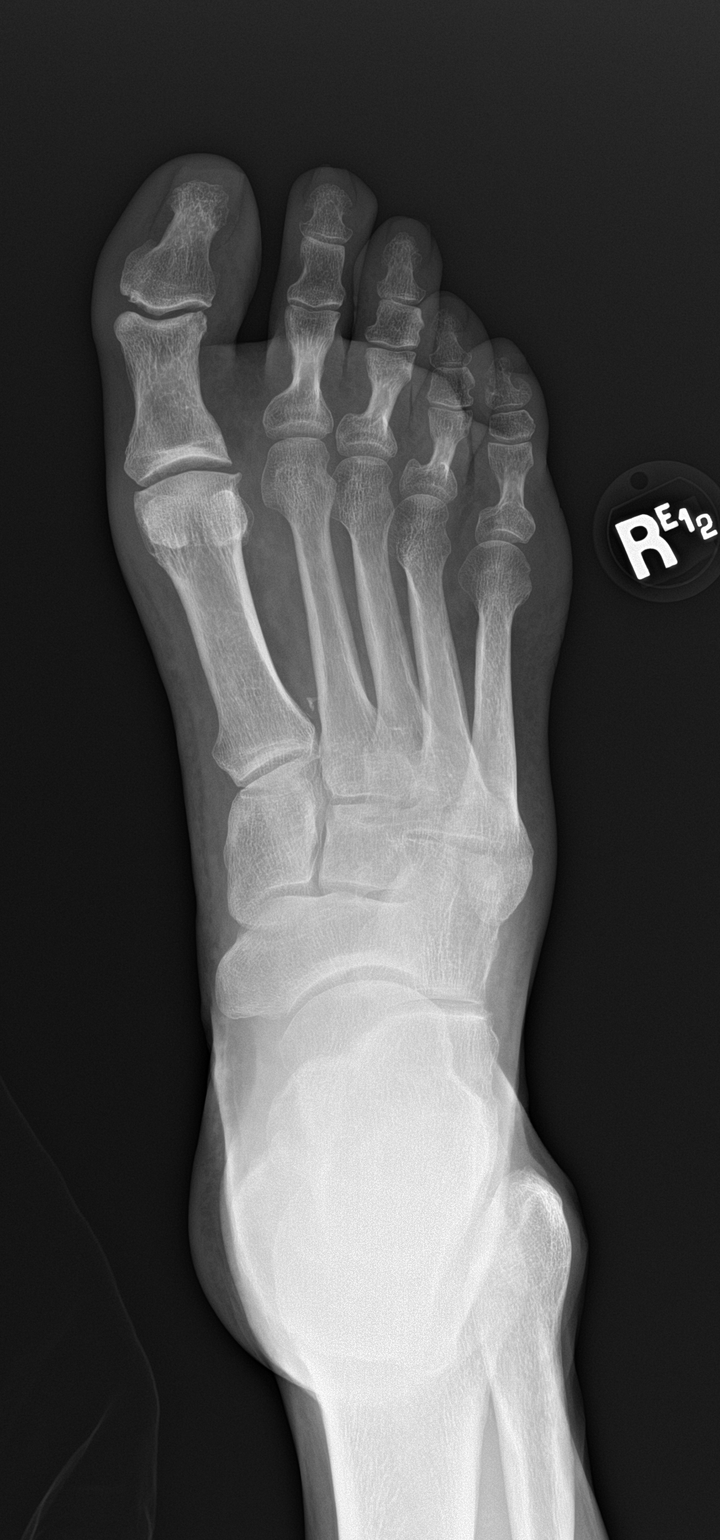

[foot lat]
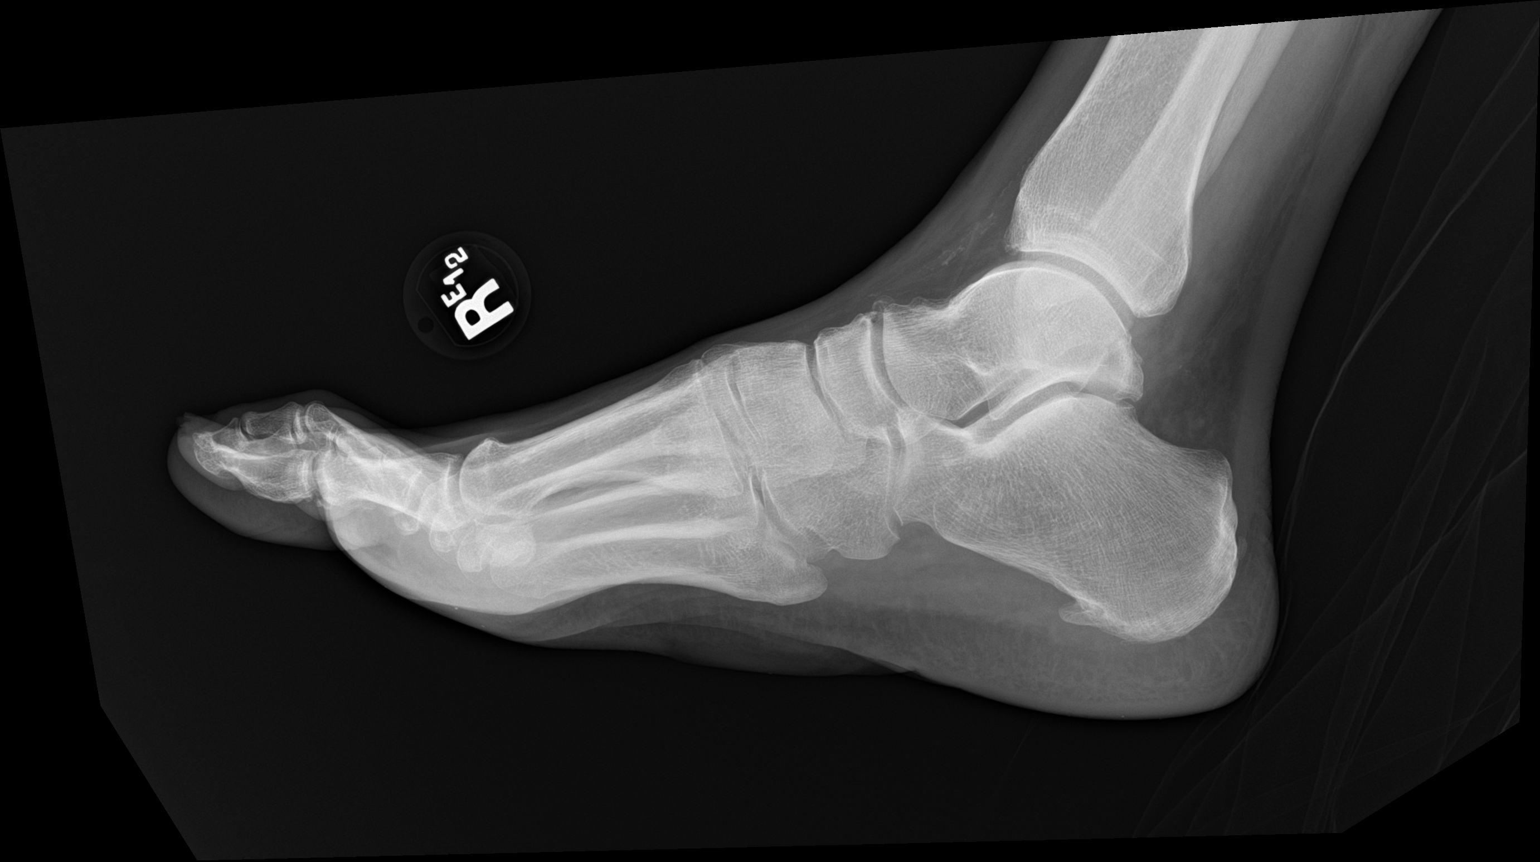

[2 of 2 positions shown; findings below may reference images not displayed]

FINDINGS: There is no evidence of fracture or dislocation. Similar mild first
metatarsal phalangeal joint osteoarthritis. No erosion or bony
destruction. Minimal midfoot degenerative spurring. There is a
plantar calcaneal spur. Site of ulcer is not well visualized by
radiograph. Two tiny densities in the plantar foot in the region of
the metatarsal phalangeal joint is seen only on the lateral view,
not seen on prior exam. These may be superficial or within the skin.
No soft tissue air.
IMPRESSION: 1. Two tiny densities in the plantar foot in the region of the
metatarsophalangeal joints seen only on the lateral view, may be
superficial or within the skin. No soft tissue air.
2. Mild osteoarthritis of the first metatarsophalangeal joint.
3. No radiographic evidence of osteomyelitis.

## 2022-07-12 ENCOUNTER — Other Ambulatory Visit: Payer: Self-pay

## 2022-07-12 ENCOUNTER — Emergency Department
Admission: EM | Admit: 2022-07-12 | Discharge: 2022-07-12 | Disposition: A | Discharge status: Home / Self Care | Payer: Medicaid Other | Attending: Emergency Medicine | Admitting: Emergency Medicine

## 2022-07-12 DIAGNOSIS — E119 Type 2 diabetes mellitus without complications: Secondary | ICD-10-CM | POA: Insufficient documentation

## 2022-07-12 DIAGNOSIS — Z4802 Encounter for removal of sutures: Secondary | ICD-10-CM | POA: Insufficient documentation

## 2022-07-12 DIAGNOSIS — Z89511 Acquired absence of right leg below knee: Secondary | ICD-10-CM | POA: Insufficient documentation

## 2022-07-12 DIAGNOSIS — L03115 Cellulitis of right lower limb: Secondary | ICD-10-CM | POA: Diagnosis not present

## 2022-07-12 DIAGNOSIS — I10 Essential (primary) hypertension: Secondary | ICD-10-CM | POA: Insufficient documentation

## 2022-07-12 DIAGNOSIS — Z5189 Encounter for other specified aftercare: Secondary | ICD-10-CM

## 2022-07-12 MED ORDER — AMOXICILLIN-POT CLAVULANATE 875-125 MG PO TABS
1.0000 | ORAL_TABLET | Freq: Once | ORAL | Status: AC
  Administered 2022-07-12: 1 via ORAL
  Filled 2022-07-12: qty 1

## 2022-07-12 MED ORDER — LIDOCAINE-EPINEPHRINE-TETRACAINE (LET) TOPICAL GEL
3.0000 mL | Freq: Once | TOPICAL | Status: AC
  Administered 2022-07-12: 3 mL via TOPICAL
  Filled 2022-07-12 (×2): qty 3

## 2022-07-12 MED ORDER — LIDOCAINE-EPINEPHRINE 2 %-1:100000 IJ SOLN
30.0000 mL | Freq: Once | INTRAMUSCULAR | Status: AC
  Administered 2022-07-12: 30 mL
  Filled 2022-07-12: qty 2

## 2022-07-12 MED ORDER — AMOXICILLIN-POT CLAVULANATE 875-125 MG PO TABS: 1.0000 | ORAL_TABLET | Freq: Two times a day (BID) | ORAL | 0 refills | Status: AC

## 2022-07-12 NOTE — ED Provider Triage Note (Signed)
Emergency Medicine Provider Triage Evaluation Note  [name] , a 62 y.o. male  was evaluated in triage.  Pt complains of suture and staple removal. Have been in for 2-3 months from [facility].  Patient states he will need a "shot for pain".    Review of Systems  Positive:  Negative: No fever, chills or drainage  Physical Exam  There were no vitals taken for this visit. Gen:   Awake, no distress   Resp:  Normal effort  MSK:   Right AKA with sutures and staples present.   Other:    Medical Decision Making  Medically screening exam initiated at 8:59 AM.  Appropriate orders placed.  [name] was informed that the remainder of the evaluation will be completed by another provider, this initial triage assessment does not replace that evaluation, and the importance of remaining in the ED until their evaluation is complete.     Richard Hai, PA-C 07/12/22 [phone]

## 2022-07-12 NOTE — ED Triage Notes (Signed)
Pt presents to ED with c/o of staple and suture removal. Pt state stiches he has had them for 2-3 months.

## 2022-07-12 NOTE — ED Notes (Signed)
Pt not in room; pt left bathroom in lobby and headed straight to front of hospital; this RN went outside to redirect pt to front desk; gave pt his antibiotic pill and d/c paperwork from [facility]. EDP Jacelyn Grip d/c pt himself.

## 2022-07-12 NOTE — ED Provider Notes (Addendum)
[facility] Provider Note    Event Date/Time   First MD Initiated Contact with Patient 07/12/22 1108     (approximate)   History   Suture / Staple Removal   HPI  [name] is a 62 y.o. male   Past medical history of diabetes and right BKA, DVT, hypertension who presents to the emergency department with suture removal request.  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  He comes today because he finally wants these sutures and staples removed.  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    History obtained by the patient.  I also reviewed clinic visit from October 1 vascular surgery clinic was unable to remove stitches and staples due to unable to tolerate pain.     Physical Exam   Triage Vital Signs: ED Triage Vitals [07/12/22 0859]  Enc Vitals Group     BP (!) 160/88     Pulse Rate (!) 120     Resp 17     Temp 98.4 F (36.9 C)     Temp Source Oral     SpO2 99 %     Weight      Height      Head Circumference      Peak Flow      Pain Score      Pain Loc      Pain Edu?      Excl. in [place]?     Most recent vital signs: Vitals:   07/12/22 0859 07/12/22 1225  BP: (!) 160/88 138/85  Pulse: (!) 120 (!) 110  Resp: 17 18  Temp: 98.4 F (36.9 C)   SpO2: 99% 98%    General: Awake, no distress.  CV:  Good peripheral perfusion.  Resp:  Normal effort.  Abd:  No distention.  Other:  Awake alert oriented nontoxic-appearing normal hemodynamics with no fever, mild tachycardia.  He does have some cellulitic changes to the lateral side of the stump, scant purulent drainage around the staples.   ED Results / Procedures / Treatments   Labs (all labs ordered are listed, but only abnormal results  are displayed) Labs Reviewed - No data to display    PROCEDURES:  Critical Care performed: No  Procedures   MEDICATIONS ORDERED IN ED: Medications  lidocaine-EPINEPHrine-tetracaine (LET) topical gel (3 mLs Topical Given 07/12/22 1140)  lidocaine-EPINEPHrine (XYLOCAINE W/EPI) 2 %-1:100000 (with pres) injection 30 mL (30 mLs Other Given 07/12/22 1329)  amoxicillin-clavulanate (AUGMENTIN) 875-125 MG per tablet 1 tablet (1 tablet Oral Given 07/12/22 1329)     IMPRESSION / MDM / ASSESSMENT AND PLAN / ED COURSE  I reviewed the triage vital signs and the nursing notes.                              Differential diagnosis includes, but is not limited to, cellulitis, abscess, suture removal MDM: Patient was able to tolerate most of suture removal after applying let topical anesthesia and some lidocaine with epinephrine injection.  However he was unable to tolerate the last of the procedure due to significant pain, 1 staple and approximately 3 sutures were left in place.  He asked for the procedure to stop and asked to be discharged because he has other issues to address at home at this time, offered observation for further anesthesia at this time the patient declines.    He is overall well appearing no fever non toxic no crepitus and only mild cellulitic changes at the lateral portion and I doubt sepsis or nec fasc - in fact his chief complaint is not pain or infection but instead for suture removal because he finally has time to see the doctor today.   However had cellulitis changes noted today and some scant purulent drainage around the site and so was given Augmentin (I discussed preferred antibiotics for abscess/cellulitis but patient concerned for his history of sulfa allergy and other allergies to antibiotics but was certain ampicillin and augmentin had worked for skin infection and was well tolerated in the past) so a prescription for 10 days sent and he advised to come back to the emergency  department with any worsening infectious signs or symptoms.  Follow-up with vascular surgery.   Patient's presentation is most consistent with acute presentation with potential threat to life or bodily function.       FINAL CLINICAL IMPRESSION(S) / ED DIAGNOSES   Final diagnoses:  Visit for suture removal  Visit for wound check  Cellulitis of right leg     Rx / DC Orders   ED Discharge Orders          Ordered    amoxicillin-clavulanate (AUGMENTIN) 875-125 MG tablet  2 times daily        07/12/22 1316             Note:  This document was prepared using Dragon voice recognition software and may include unintentional dictation errors.    Pilar Jarvis, MD 07/12/22 1345    Pilar Jarvis, MD 07/12/22 0938    Pilar Jarvis, MD 07/12/22 2100

## 2022-07-12 NOTE — ED Notes (Signed)
Patient unwilling to get leg wrapped as requested by provider. NAD noted. Wheeled self out of ED.

## 2022-07-12 NOTE — ED Notes (Signed)
Lidocaine remains at bedside table for EDP Wong. Pt in NAD.

## 2022-07-12 NOTE — Discharge Instructions (Addendum)
Take antibiotics.  Take tylenol 650mg  every 6 hours for pain.    Call Dr Lorenso Courier or your regular doctor to recheck the wound and remove those last stitches/staples.   Please keep your wound clean by washing at least daily with soap and water. If you see any signs of infection like spreading redness, pus coming from the wound, extreme pain, fevers, chills or any other worsening doctor right away or come back to the emergency department   Thank you for choosing [place] for your health care today!  Please see your primary doctor this week for a follow up appointment.   Sometimes, in the early stages of certain disease courses it is difficult to detect in the emergency department evaluation -- so, it is important that you continue to monitor your symptoms and call your doctor right away or return to the emergency department if you develop any new or worsening symptoms.  Please go to the following website to schedule new (and existing) patient appointments:   [email or web address]  If you do not have a primary doctor try calling the following clinics to establish care:  If you have insurance:  [facility] [phone] [place] [address]   [facility]  [phone] [place] [place] [address]   If you do not have insurance:  [facility]  [phone] [address] [place] [address]   The following is another list of primary care offices in the area who are accepting new patients at this time.  Please reach out to one of them directly and let them know you would like to schedule an appointment to follow up on an Emergency Department visit, and/or to establish a new primary care provider (PCP).  There are likely other primary care clinics in the are who are accepting new patients, but this is an excellent place to start:  [place] physician: Dr Lavon Paganini [address] [place], [place] [address] [phone]  [facility] Lead Physician: Dr Steele Sizer [address], [place], [place] [address] [phone]  [facility] Physician: Dr Park Liter [address] [place], [place] [address] [phone]  [facility] Lead Physician: Dr Dewaine Oats [place], [place], [place] [address] [phone]  [facility] Physician: Dr Halina Maidens [address] [place], [place] [address] [phone]   It was my pleasure to care for you today.   Hoover Brunette Jacelyn Grip, MD

## 2022-07-22 ENCOUNTER — Encounter: Payer: Self-pay | Admitting: *Deleted

## 2022-07-29 ENCOUNTER — Other Ambulatory Visit: Payer: Self-pay

## 2022-07-29 DIAGNOSIS — Z87891 Personal history of nicotine dependence: Secondary | ICD-10-CM

## 2022-07-29 DIAGNOSIS — Z122 Encounter for screening for malignant neoplasm of respiratory organs: Secondary | ICD-10-CM

## 2022-07-29 DIAGNOSIS — F17210 Nicotine dependence, cigarettes, uncomplicated: Secondary | ICD-10-CM

## 2022-08-09 ENCOUNTER — Emergency Department: Payer: Medicaid Other

## 2022-08-09 ENCOUNTER — Emergency Department
Admission: EM | Admit: 2022-08-09 | Discharge: 2022-08-09 | Disposition: A | Discharge status: Home / Self Care | Payer: Medicaid Other | Attending: Student in an Organized Health Care Education/Training Program | Admitting: Student in an Organized Health Care Education/Training Program

## 2022-08-09 ENCOUNTER — Other Ambulatory Visit: Payer: Self-pay

## 2022-08-09 DIAGNOSIS — Z1152 Encounter for screening for COVID-19: Secondary | ICD-10-CM | POA: Diagnosis not present

## 2022-08-09 DIAGNOSIS — R739 Hyperglycemia, unspecified: Secondary | ICD-10-CM

## 2022-08-09 LAB — BASIC METABOLIC PANEL
Anion gap: 12 (ref 5–15)
BUN: 9 mg/dL (ref 8–23)
CO2: 26 mmol/L (ref 22–32)
Calcium: 9.7 mg/dL (ref 8.9–10.3)
Chloride: 97 mmol/L — ABNORMAL LOW (ref 98–111)
Creatinine, Ser: 0.75 mg/dL (ref 0.61–1.24)
GFR, Estimated: >60 mL/min (ref >60)
Glucose, Bld: 216 mg/dL — ABNORMAL HIGH (ref 70–99)
Potassium: 3.6 mmol/L (ref 3.5–5.1)
Sodium: 135 mmol/L (ref 135–145)

## 2022-08-09 LAB — RESP PANEL BY RT-PCR (RSV, FLU A&B, COVID)  RVPGX2
Influenza A by PCR: NEGATIVE
Influenza B by PCR: NEGATIVE
Resp Syncytial Virus by PCR: NEGATIVE
SARS Coronavirus 2 by RT PCR: NEGATIVE

## 2022-08-09 LAB — CBC
HCT: 49.9 % (ref 39.0–52.0)
Hemoglobin: 17.2 g/dL — ABNORMAL HIGH (ref 13.0–17.0)
MCH: 31.8 pg (ref 26.0–34.0)
MCHC: 34.5 g/dL (ref 30.0–36.0)
MCV: 92.2 fL (ref 80.0–100.0)
Platelets: 170 10*3/uL (ref 150–400)
RBC: 5.41 MIL/uL (ref 4.22–5.81)
RDW: 12.5 % (ref 11.5–15.5)
WBC: 8.2 10*3/uL (ref 4.0–10.5)
nRBC: 0 % (ref 0.0–0.2)

## 2022-08-09 LAB — CBG MONITORING, ED
Glucose-Capillary: 206 mg/dL — ABNORMAL HIGH (ref 70–99)
Glucose-Capillary: 205 mg/dL — ABNORMAL HIGH (ref 70–99)

## 2022-08-09 NOTE — ED Notes (Signed)
See triage note  Presents with issues with elevated b/p and his sugar was up  On arrival b/p is coming down  States he is also concerned for COVID/flu

## 2022-08-09 NOTE — ED Triage Notes (Addendum)
Pt presents to ED with c/o of hyperglycemia. Pt states some nausea. Pt here via [facility].  Pt c/o of "head cold" for the past few days.   Pt states he takes 1000 mg of metformin BID, pt states also taking a long acting insulin via pen. Pt is A&Ox4. NAD noted. VSS.

## 2022-08-09 NOTE — ED Provider Notes (Signed)
[facility] Provider Note    Event Date/Time   First MD Initiated Contact with Patient 08/09/22 1723     (approximate)   History   Hyperglycemia   HPI  [name] is a 62 y.o. male presents to the ER for evaluation of high blood sugar.  States that he was feeling a little unwell this afternoon checked his blood sugar and it was low and this 60s and then rechecked it it was 55.  Patient ate some food including pizza.  Felt very weak and called EMS.  EMS recheck blood sugar was improving.  Did have some nausea but denies any now.  Denies any chest pain no shortness of breath.  States he has been dealing with some congestion.     Physical Exam   Triage Vital Signs: ED Triage Vitals  Enc Vitals Group     BP 08/09/22 1705 (!) 140/91     Pulse Rate 08/09/22 1705 (!) 108     Resp 08/09/22 1705 18     Temp 08/09/22 1705 99 F (37.2 C)     Temp Source 08/09/22 1705 Oral     SpO2 08/09/22 1705 100 %     Weight 08/09/22 1734 196 lb 3.4 oz (89 kg)     Height 08/09/22 1734 5\' 11"  (1.803 m)     Head Circumference --      Peak Flow --      Pain Score 08/09/22 1705 0     Pain Loc --      Pain Edu? --      Excl. in [place]? --     Most recent vital signs: Vitals:   08/09/22 1859 08/09/22 1859  BP:  134/86  Pulse:    Resp:  17  Temp:    SpO2: 95%      Constitutional: Alert  Eyes: Conjunctivae are normal.  Head: Atraumatic. Nose: No congestion/rhinnorhea. Mouth/Throat: Mucous membranes are moist.   Neck: Painless ROM.  Cardiovascular:   Good peripheral circulation. Respiratory: Normal respiratory effort.  No retractions.  Gastrointestinal: Soft and nontender.  Musculoskeletal:  no deformity Neurologic:  MAE spontaneously. No gross focal neurologic deficits are appreciated.  Skin:  Skin is warm, dry and intact. No rash noted. Psychiatric: Mood and affect are normal. Speech and behavior are normal.    ED Results / Procedures / Treatments    Labs (all labs ordered are listed, but only abnormal results are displayed) Labs Reviewed  CBC - Abnormal; Notable for the following components:      Result Value   Hemoglobin 17.2 (*)    All other components within normal limits  BASIC METABOLIC PANEL - Abnormal; Notable for the following components:   Chloride 97 (*)    Glucose, Bld 216 (*)    All other components within normal limits  CBG MONITORING, ED - Abnormal; Notable for the following components:   Glucose-Capillary 206 (*)    All other components within normal limits  CBG MONITORING, ED - Abnormal; Notable for the following components:   Glucose-Capillary 205 (*)    All other components within normal limits  RESP PANEL BY RT-PCR (RSV, FLU A&B, COVID)  [id number]     EKG     RADIOLOGY Please see ED Course for my review and interpretation.  I personally reviewed all radiographic images ordered to evaluate for the above acute complaints and reviewed radiology reports and findings.  These findings were personally discussed with the patient.  Please see  medical record for radiology report.    PROCEDURES:  Critical Care performed:   Procedures   MEDICATIONS ORDERED IN ED: Medications - No data to display   IMPRESSION / MDM / [place] / ED COURSE  I reviewed the triage vital signs and the nursing notes.                              Differential diagnosis includes, but is not limited to, hyperglycemia, hypoglycemia, AKI, URI, viral illness, pneumonia, bronchitis, sepsis, dehydration  Patient presenting to the ER for evaluation of symptoms as described above.  Based on symptoms, risk factors and considered above differential, this presenting complaint could reflect a potentially life-threatening illness therefore the patient will be placed on continuous pulse oximetry and telemetry for monitoring.  Laboratory evaluation will be sent to evaluate for the above complaints.      Clinical Course as of  08/09/22 1927  Tue Aug 09, 2022  1753 Patient with mild hyperglycemia but just ate to correct some symptomatic hypoglycemia he was having at home.  No sign of DKA no acidemia. [PR]  1925 Patient reassessed.  Remains well-appearing no acute distress no hypoxia no tach cardia.  No signs or symptoms suggest septic or infectious process.  Tolerating p.o.  No nausea no abdominal pain.  Does appear stable and appropriate for outpatient follow-up. [PR]    Clinical Course User Index [PR] Merlyn Lot, MD      FINAL CLINICAL IMPRESSION(S) / ED DIAGNOSES   Final diagnoses:  Hyperglycemia     Rx / DC Orders   ED Discharge Orders     None        Note:  This document was prepared using Dragon voice recognition software and may include unintentional dictation errors.    Merlyn Lot, MD 08/09/22 [phone]

## 2022-09-13 ENCOUNTER — Encounter: Payer: Medicaid Other | Admitting: Acute Care

## 2022-09-13 ENCOUNTER — Telehealth: Payer: Self-pay | Admitting: Acute Care

## 2022-09-13 NOTE — Telephone Encounter (Signed)
Appointment for sdmv has been marked No show and LDCT has been canceled.  Will plan to reschedule him

## 2022-09-13 NOTE — Telephone Encounter (Signed)
I have attempted to call the patient with the results of their  Low Dose CT Chest Lung cancer screening scan. There was no answer.  I called the patient a total of 4 times. There was no answer and no option to leave a message. He will need to be rescheduled.

## 2022-09-13 NOTE — Progress Notes (Signed)
 SABRA

## 2022-09-14 ENCOUNTER — Ambulatory Visit: Payer: Medicaid Other

## 2024-01-30 NOTE — Progress Notes (Signed)
 This encounter was created in error - please disregard.

## 2024-02-13 NOTE — Therapy (Addendum)
 OUTPATIENT PHYSICAL THERAPY WHEELCHAIR EVALUATION   Patient Name: [name] MRN: [id number] DOB:05-20-1961, 63 y.o., male Today's Date: 02/14/2024  END OF SESSION:  PT End of Session - 02/14/24 1528     Visit Number 1    Number of Visits 1    Date for PT Re-Evaluation 02/14/24    PT Start Time 1530    PT Stop Time 1625    PT Time Calculation (min) 55 min    Equipment Utilized During Treatment Gait belt;Other (comment)   Standard Walker         Past Medical History:  Diagnosis Date   Arthritis    Blood clot in vein    Diabetes mellitus    DVT (deep venous thrombosis) (HCC)    Hypertension    Neuropathy    Type 2 diabetes mellitus with diabetic neuropathy, unspecified (HCC) 02/18/2020   Past Surgical History:  Procedure Laterality Date   AMPUTATION Right 07/01/2021   Procedure: AMPUTATION DIGIT;  Surgeon: Tobie Franky SQUIBB, DPM;  Location: [facility];  Service: Podiatry;  Laterality: Right;   APPENDECTOMY     INCISION AND DRAINAGE Right 03/20/2022   Procedure: ABOVE THE KNEE AMPUTATION;  Surgeon: Tisa Curry LABOR, MD;  Location: [facility];  Service: Vascular;  Laterality: Right;   LOWER EXTREMITY ANGIOGRAPHY Right 01/27/2020   Procedure: LOWER EXTREMITY ANGIOGRAPHY;  Surgeon: Marea Selinda RAMAN, MD;  Location: [facility];  Service: Cardiovascular;  Laterality: Right;   VEIN LIGATION AND STRIPPING     of left leg   Patient Active Problem List   Diagnosis Date Noted   Constipation 03/22/2022   Osteomyelitis (HCC) 03/19/2022   Hyponatremia 03/19/2022   Cellulitis and abscess of right leg 03/18/2022   History of depression with anxiety 06/30/2021   Peripheral vascular disease (HCC) 06/30/2021   Hospital discharge follow-up 12/24/2020   Uncontrolled type 2 diabetes mellitus with hyperglycemia, with long-term current use of insulin  (HCC)    Tobacco abuse counseling    Cellulitis/ulcer of right great toe due to diabetes mellitus (HCC) 10/08/2020   Pain of right great toe  10/08/2020   Ischemic foot 09/29/2020   Excessive cerumen in ear canal, bilateral 05/05/2020   History of anxiety 03/11/2020   Ischemia of lower extremity 01/16/2020   Needs assistance with community resources 09/11/2019   Neuropathic pain of ankle, left 01/25/2017   Adjustment disorder with mixed anxiety and depressed mood 08/27/2016   MDD (major depressive disorder) 08/10/2016   Hyperlipidemia 06/30/2016   Thrombocytopenia (HCC) 12/06/2012   Tobacco use disorder 12/05/2012   Dyslipidemia 12/05/2012   Essential hypertension 05/11/2011    PCP: Osa Geralds, NP   REFERRING PROVIDER: Osa Geralds, NP   REFERRING DIAG:  [phone] (ICD-10-CM) - History of above knee amputation, left (HCC)  Z91.81 (ICD-10-CM) - History of falling    THERAPY DIAG:  Abnormality of gait and mobility  ONSET DATE: 03/18/22   SUBJECTIVE:  SUBJECTIVE STATEMENT: Patient reports she had initial amputation several years ago and had to have another amputation of the same side for an above-knee amputation and September of 2023.  Patient is currently using a wheelchair he received from a friend.  Wheelchair is very dirty and breaks as well as C cushioning are very inadequate for safe use of wheelchair within the home and community.  Patient has not received a wheelchair through his insurance at all to his knowledge.  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 No       PATIENT INFORMATION: This Evaluation form will serve as the LMN for the following  suppliers: [employer]   Supplier: [employer] Contact Person: Chyrl Silvan PTA, ATP Phone: [phone]   Reason for Referral: Patient/caregiver Goals: Patient was seen for face-to-face evaluation for new power wheelchair.  Also present was    Chyrl Silvan, PTA                       to discuss recommendations and wheelchair options.  Further paperwork was completed and sent to vendor.  Patient appears to qualify for power mobility device at this time per objective findings.   MEDICAL HISTORY: Diagnosis: Above Knee Amputation on the R side  Primary Diagnosis Onset: 03/18/22 [] Progressive Disease Relevant Past and Future Surgeries: Patient had below-knee amputation on September 24, 2021.  And September of the following year patient had to have a subsequent above-knee amputation on the same right side. Height:5 foot 11 inches Weight: 190 lb Explain and recent changes or trends in weight: No significant recent changes in weight or height  Relevant History including falls: No falls in the last 6 months    HOME ENVIRONMENT: [] House  [] Condo/town home  [] Apartment  [x] Group Home    [] Lives Alone [x]  Lives with Others                                                    Hours with caregiver: Someone in house 24 hours/ day   [x] Home is accessible to patient            Stairs  [] Yes [x]  No     Ramp [x] Yes [] No Comments:     COMMUNITY ADL: TRANSPORTATION: [] Car    [] Doctor, hospital    [] Adapted w/c Lift   [] Ambulance   [x] Other:     Help from group home staff for transportation  [] Sits in wheelchair during transport  Employment/School:  Currently unemployed but is interested in jobs that are adaptive Specific requirements pertaining to mobility                                                     Other:                                       FUNCTIONAL/SENSORY PROCESSING  SKILLS:  Handedness:   [x] Right     [] Left    [] NA  Comments:                                 Functional  Processing Skills for Wheeled Mobility [x] Processing Skills are adequate for safe wheelchair operation  Areas of concern than may interfere with safe operation of wheelchair Description of problem   []  Attention to environment     [] Judgment     []  Hearing  []  Vision or visual processing    [] Motor Planning  []  Fluctuations in Behavior                                                   VERBAL COMMUNICATION: [x] WFL receptive [x]  WFL expressive [x] Understandable  [] Difficult to understand  [] non-communicative []  Uses an augmented communication device    CURRENT SEATING / MOBILITY: Current Mobility Base: Standard manual wheelchair.  Unable to distinguish components of wheelchair due to his age and condition.  Patient reports he got this wheelchair from her friend.  It appears wheelchair was left outside and the cushion is in very poor condition.  Not providing adequate pressure distribution in seated position whatsoever.  [] None  [] Dependent  [x] Manual  [] Scooter  [] Power   Type of Control:                       Manufacturer:       Unknown                  size:       Standard                  age:         Unknown                  Current Condition of Mobility Base:          Brakes are very inadequate did not provide any significant breaking particularly up on a hill.  Physical therapist is able to push wheelchair just with strength testing performed in session.                                                                                                           Current Wheelchair components:      Both brakes and wheelchair cushion are in very poor condition cushion is providing minimal to no pressure distribution in seated position this particular the concerning based on patient's time spent in seated position.  Describe posture in present seating system:                Slight slumped posture                                                            SENSATION and SKIN ISSUES: Sensation [] Intact [x] Impaired [] Absent   Level of sensation:                           Pressure Relief: Able to perform effective pressure relief :   [x] Yes  []  No Method:    Is able to use triceps and upper extremities to provide pressure relief                                                                          If not, Why?:                                                                          Skin Issues/Skin Integrity Current Skin Issues   [x] Yes [] No  [] Intact []  Red area []  Open Area  [] Scar Tissue [x] At risk from prolonged sitting  Where           due to poor particular vascularization as well as prior lower extremity amputations and poor healing.                   History of Skin Issues   [x] Yes [] No  Where    right lower extremity                                     When      required amputation above the knee most recently in September 2023                                         Hx of skin flap surgeries [] Yes [x] No  Where                  When                                                  Limited sitting tolerance [] Yes [x] No Hours spent sitting in wheelchair daily:  Complaint of Pain:  Please describe:                        Pt described phantom pain in the R LE                                                                                     Swelling/Edema:                                                                                                                                               ADL STATUS (in reference to wheelchair use):  Indep Assist Unable Indep with Equip Not assessed Comments  Dressing    X                                                                     Eating  X                                                                                                                             Toileting                                          X  Bathing                                        X                                                                                              Grooming/ Hygiene         X                                                                                                                     Meal Prep                                        X                                                                                  IADLS                                       X                                                                           Bowel Management: [x] Continent  [] Incontinent  [] Accidents Comments:                                                  Bladder Management: [x] Continent  [] Incontinent  [] Accidents Comments:  WHEELCHAIR SKILLS: Manual w/c Propulsion: [x] UE or LE strength and endurance sufficient to participate in ADLs using manual wheelchair Arm :  [x] left [x] right  [] Both                                   Foot:   [] left [] right  [] Both  Distance: Over 100 feet going from clinic waiting area to gym and back.  Operate Scooter: [x]  Strength, hand grip, balance and transfer appropriate for use [] Living environment is accessible for use of scooter  Operate Power w/c:  []  Std. Joystick   []  Alternative Controls Indep [x]  Assist []  Dependent/ Unable []  N/A []  [] Safe          []  Functional      Distance:                Bed confined without wheelchair [x]  Yes []  No   STRENGTH/RANGE OF MOTION:  Range of Motion Strength  Shoulder              WNL                                              5/5 B shoulder flex and ABD                                                 Elbow         WNL                                                                     5/5 b Flex and ABD                                    Wrist/Hand         WNL                                                                         WNL Bilateral                                                        Hip  Knee       WNL on Left                                                            WNL ( 5/5 on L)                                                         Ankle WNL on Left                    WNL (5/5 L)                                                   MOBILITY/BALANCE:  [x]  Patient is totally dependent for mobility         without the use of a wheelchair                                                                                      Balance Transfers Ambulation  Sitting Balance: Standing Balance: [x]  Independent from wheelchair to mat table and back. []  Independent/Modified Independent  [x]  WFL     []  WFL []  Supervision []  Supervision  []  Uses UE for balance  []  Supervision []  Min Assist []  Ambulates with Assist                           []  Min Assist []  Min assist []  Mod Assist []  Ambulates with Device:  []  RW   []  StW   []  Cane   [x]    Standard WC             []  Mod Assist []  Mod assist []  Max assist   []  Max Assist []  Max assist []  Dependent [x]  Indep. Short Distance Only with standard walker  []  Unable [x]  Unable- cannot stand without UE assist from standard walker  []  Lift / Sling Required Distance  5 ft, close CGA(in feet)                             []  Sliding board []  Unable to Ambulate: (Explain:  Cardio Status:  [x] Intact  []  Impaired   []  NA                              Respiratory Status:  [x] Intact   [] Impaired   [] NA  Orthotics/Prosthetics:       None at this time                                                                  Comments (Address manual vs power w/c vs scooter):        Patient has adequate upper extremity  strength to propel manual wheelchair.  Patient current wheelchair base is not adequate secondary to excessive wear and tear and almost no cushioning for the seat due to excessive wear-and-tear of his particular chair.  Power wheelchair would be cumbersome and would not be adequate for his current needs.  Scooter would also be cumbersome and would not provide the independence he will benefit from of having a light weight manual chair.  Patient does fatigue easily with mobilizing current wheelchair base so therefore light weight base will be much more beneficial for him and allow him to propel to areas like local store from his current home with improved ease.       Anterior / Posterior Obliquity Rotation-Pelvis  PELVIS    [x] Neutral  []  Posterior  []  Anterior     [x] WFL  [] Right Elevated  [] Left Elevated   [x] WFL  [] Right Anterior []   Left Anterior    []  Fixed []  Partly Flexible []  Flexible  []  Other  []  Fixed  []  Partly Flexible  []  Flexible []  Other  []  Fixed  []  Partly Flexible  []  Flexible []  Other  TRUNK [x] WFL [] Thoracic Kyphosis [] Lumbar Lordosis   [x]  WFL [] Convex Right [] Convex Left   [] c-curve [] s-curve [] multiple  [x]  Neutral []  Left-anterior []  Right-anterior    []  Fixed []  Flexible []  Partly Flexible       Other  []  Fixed []  Flexible []  Partly Flexible []  Other  []  Fixed           []  Flexible []  Partly Flexible []  Other   Position Windswept   HIPS  [x]  Neutral []  Abduct []  ADduct [x]  Neutral []  Right []  Left       []  Fixed  []  Partly Flexible             []  Dislocated []  Flexible []  Subluxed    []  Fixed []  Partly Flexible  []  Flexible []  Other              Foot Positioning Knee Positioning   Knees and  Feet  [x]  WFL [] Left [] Right [x]  WFL [] Left [] Right   KNEES ROM concerns: ROM concerns:   & Dorsi-Flexed                    [] Lt [] Rt                                  FEET Plantar Flexed                  [] Lt [] Rt      Inversion                    [] Lt [] Rt     Eversion                    [] Lt [] Rt  HEAD []  Functional [x]  Good Head Control   & []  Flexed         []  Extended []  Adequate Head Control   NECK []  Rotated  Lt  []  Lat Flexed Lt []  Rotated  Rt []  Lat Flexed Rt []  Limited Head Control    []  Cervical Hyperextension []  Absent  Head Control    SHOULDERS ELBOWS WRIST& HAND         Left     Right    Left     Right  U/E [x] Functional  Left            [x] Functional  Right                                 [] Fisting             [] Fisting     [] elevated Left [] depressed  Left [] elevated Right [] depressed  Right      [] protracted Left [] retracted Left [] protracted Right [] retracted Right [] subluxed  Left              [] subluxed  Right         Goals for Wheelchair Mobility  [x]  Independence with mobility in the home with motor related ADLs (MRADLs)  [x]  Independence with MRADLs in the community []  Provide dependent mobility  []  Provide recline     [] Provide tilt   Goals for Seating system [x]  Optimize pressure distribution []  Provide support needed to facilitate function or safety []  Provide corrective forces to assist with maintaining or improving posture []  Accommodate client's posture: current seated postures and positions are not flexible or will not tolerate corrective forces []  Client to be independent with relieving pressure in the wheelchair [] Enhance physiological function such as breathing, swallowing, digestion  Simulation ideas/Equipment trials:          trialed standard walker, pt very unstable and required close CGA for safety with this mode of ambulation using step to pattern.                                                                                       State why other equipment was unsuccessful:       not safe without close and skilled clinician present                                                                     MOBILITY BASE RECOMMENDATIONS and  JUSTIFICATION: MOBILITY COMPONENT JUSTIFICATION  Manufacturer:    [employer]   Model:  Catalyst 5 SVt       Size: Width 20in         Seat Depth   20 in      [x] provide transport from point A to B [x] promote Indep mobility  [x] is not a safe, functional ambulator [x] walker or cane inadequate [x] non-standard width/depth necessary to accommodate  anatomical measurement []                             [x] Manual Mobility Base [] non-functional ambulator    [] Scooter/POV  [] can safely operate  [] can safely transfer   [] has adequate trunk stability  [] cannot functionally propel manual w/c  [] Power Mobility Base  [] non-ambulatory  [] cannot functionally propel manual wheelchair  []  cannot functionally and safely operate scooter/POV [] can safely operate and willing to  [] Stroller Base [] infant/child  [] unable to propel manual wheelchair [] allows for growth [] non-functional ambulator [] non-functional UE [] Indep mobility is not a goal at this time  [] Tilt  [] Forward                   [] Backward                  [] Powered tilt              [] Manual tilt  [] change position against gravitational force on head and shoulders  [] change position for pressure relief/cannot weight shift [] transfers  [] management of tone [] rest periods [] control edema [] facilitate postural control  []                                       [] Recline  [] Power recline on power base [] Manual recline on manual base  [] accommodate femur to back angle  [] bring to full recline for ADL care  [] change position for pressure relief/cannot weight shift [] rest periods [] repositioning for transfers or clothing/diaper /catheter changes [] head positioning  [x] Lighter weight required [x] self- propulsion  [x] lifting []                                                 [] Heavy Duty required [] user weight greater than 250# [] extreme tone/ over active movement [] broken frame on previous chair []                                     [x]  Back   [x]  Tension  Adjustable []  Custom molded                           [x] postural control [] control of tone/spasticity [] accommodation of range of motion [] UE functional control [] accommodation for seating system []                                          [] provide lateral trunk support [] accommodate deformity [x] provide posterior trunk support [] provide lumbar/sacral support [] support trunk in midline [] Pressure relief over spinal processes  [x]  Seat Cushion- Axiom G                       [x] impaired sensation  [] decubitus ulcers present [] history of pressure ulceration [] prevent pelvic extension [] low maintenance  [] stabilize pelvis  [] accommodate obliquity [] accommodate multiple deformity [] neutralize lower extremity position [x] increase pressure distribution []                                           []   Pelvic/thigh support  []  Lateral thigh guide []  Distal medial pad  []  Distal lateral pad []  pelvis in neutral [] accommodate pelvis []  position upper legs []  alignment []  accommodate ROM []  decrease adduction [] accommodate tone [] removable for transfers [] decrease abduction  []  Lateral trunk Supports []  Lt     []  Rt [] decrease lateral trunk leaning [] control tone [] contour for increased contact [] safety  [] accommodate asymmetry []                                                 []  Mounting hardware  [] lateral trunk supports  [] back   [] seat [] headrest      []  thigh support [] fixed   [] swing away [] attach seat platform/cushion to w/c frame [] attach back cushion to w/c frame [] mount postural supports [] mount headrest  [] swing medial thigh support away [] swing lateral supports away for transfers  []                                                     Armrests L and R [] fixed [x] adjustable height  [x] removable   [] swing away  [] flip back   [] reclining [] full length pads [x] desk    [] pads tubular  [x] provide support with elbow at 90   [] provide support for  w/c tray [] change of height/angles for variable activities [] remove for transfers [x] allow to come closer to table top [] remove for access to tables []                                               Hangers/ Leg rests  [] 60 [] 70 [x] 80 [] elevating [] heavy duty  [] articulating [] fixed [x] lift off [x] swing away     [] power [] provide LE support  [] accommodate to hamstring tightness [] elevate legs during recline   [x] provide change in position for Legs [x] Maintain placement of feet on footplate [] durability [] enable transfers [x] decrease edema [] Accommodate lower leg length []                                         Foot support Footplate    [x] Lt  [x]  Rt  []  Center mount [x] flip up                             [x] depth/angle adjustable- COMPOSITE ANGLE ADJUSTABLE [] Amputee adapter    []  Lt     []  Rt [x] provide foot support [] accommodate to ankle ROM [x] transfers [] Provide support for residual extremity []  allow foot to go under wheelchair base []  decrease tone  []                                                 [x]  Ankle strap/heel loops [x] support foot on foot support [] decrease extraneous movement [] provide input to heel  [x] protect foot  Tires: [x] pneumatic  [x] flat free inserts  [] solid  [x] decrease maintenance  [x] prevent frequent  flats [] increase shock absorbency [] decrease pain from road shock [] decrease spasms from road shock []                                              []  Headrest  [] provide posterior head support [] provide posterior neck support [] provide lateral head support [] provide anterior head support [] support during tilt and recline [] improve feeding   [] improve respiration [] placement of switches [] safety  [] accommodate ROM  [] accommodate tone [] improve visual orientation  []  Anterior chest strap []  Vest []  Shoulder retractors  [] decrease forward movement of shoulder [] accommodation of TLSO [] decrease forward movement of trunk [] decrease shoulder  elevation [] added abdominal support [] alignment [] assistance with shoulder control  []                                               Pelvic Positioner [x] Belt [] SubASIS bar [] Dual Pull [] stabilize tone [x] decrease falling out of chair/ **will not Decrease potential for sliding due to pelvic tilting [] prevent excessive rotation [] pad for protection over boney prominence [] prominence comfort [] special pull angle to control rotation []                                                  Upper ExtremitySupport  [] L   []  R [] Arm trough   [] hand support []  tray       [] full tray [] swivel mount [] decrease edema      [] decrease subluxation   [] control tone   [] placement for AAC/Computer/EADL [] decrease gravitational pull on shoulders [] provide midline positioning [] provide support to increase UE function [] provide hand support in natural position [] provide work surface   POWER WHEELCHAIR CONTROLS  [] Proportional  [] Non-Proportional Type                                      [] Left  [x] Right [] provides access for controlling wheelchair   [] lacks motor control to operate proportional drive control [] unable to understand proportional controls  Actuator Control Module  [] Single  [] Multiple   [] Allow the client to operate the power seat function(s) through the joystick control   [] Safety Reset Switches [] Used to change modes and stop the wheelchair when driving in latch mode    [] Upgraded Electronics   [] programming for accurate control [] progressive Disease/changing condition [] non-proportional drive control needed [] Needed in order to operate power seat functions through joystick control   [] Display box [] Allows user to see in which mode and drive the wheelchair is set  [] necessary for alternate controls    [] Digital interface electronics [] Allows w/c to operate when using alternative drive controls  [] ASL Head Array [] Allows client to operate wheelchair  through switches placed in  tri-panel headrest  [] Sip and puff with tubing kit [] needed to operate sip and puff drive controls  [] Upgraded tracking electronics [] increase safety when driving [] correct tracking when on uneven surfaces  [] Mount for switches or joystick [] Attaches switches to w/c  [] Swing away for access or transfers [] midline for optimal placement [] provides for consistent access  [] Attendant controlled joystick plus mount [] safety [] long distance driving []   operation of seat functions [] compliance with transportation regulations []                                             Rear wheel placement/Axle adjustability [] None [] semi adjustable [x] fully adjustable  [x] improved UE access to wheels [x] improved stability [x] changing angle in space for improvement of postural stability [] 1-arm drive access [x] amputee pad placement []                                Wheel rims/ hand rims  [x] metal   [] plastic coated [] oblique projections           [] vertical projections [x] Provide ability to propel manual wheelchair  []  Increase self-propulsion with hand weakness/decreased grasp  Push handles [] extended   [] angle adjustable               [x] Standard- WITH 8 DEGREE BEND WITH PUSH HANDLE [] caregiver access [x] caregiver assist [] allows "hooking" to enable increased ability to perform ADLs or maintain balance  One armed device   [] Lt   [] Rt [] enable propulsion of manual wheelchair with one arm   []                                            Brake/wheel lock extension [x]  Lt   [x]  Rt [x]  Push to lock brakes with extension handles [x] increase indep in applying wheel locks   [] Side guards [] prevent clothing getting caught in wheel or becoming soiled []  prevent skin tears/abrasions  Battery:                          [] to power wheelchair                                                         Other:      [x]  Anti Tippers                                                [x]    For safety with wheelchair propulsions on  inclines                                                                The above equipment has a life- long use expectancy. Growth and changes in medical and/or functional conditions would be the exceptions. This is to certify that the therapist has no financial relationship with durable medical provider or manufacturer. The therapist will not receive remuneration of any kind for the equipment recommended in this evaluation.   Patient has mobility limitation that significantly impairs safe, timely participation in one or more mobility related ADL's. (bathing, toileting, feeding, dressing, grooming, moving from room to  room)  [x]  Yes []  No  Will mobility device sufficiently improve ability to participate and/or be aided in participation of MRADL's?      [x]  Yes []  No  Can limitation be compensated for with use of a cane or walker?                                    []  Yes [x]  No  Does patient or caregiver demonstrate ability/potential ability & willingness to safely use the mobility device?    [x]  Yes []  No  Does patient's home environment support use of recommended mobility device?            [x]  Yes []  No  Does patient have sufficient upper extremity function necessary to functionally propel a manual wheelchair?     [x]  Yes []  No  Does patient have sufficient strength and trunk stability to safely operate a POV (scooter)?                                  [x]  Yes []  No  Does patient need additional features/benefits provided by a power wheelchair for MRADL's in the home?        [x]  Yes []  No  Does the patient demonstrate the ability to safely use a power wheelchair?                   [x]  Yes []  No     Physician's Name Printed:                                                        Physician's Signature:  Date:     This is to certify that I, the above signed therapist have the following affiliations: []  This DME provider []  Manufacturer of recommended equipment []  Patient's long  term care facility [x]  None of the above  Therapist Name/Signature:      Lonni Gainer PT, DPT                                        Date: 02/14/24    TREATMENT:   Self-care: Patient instructed in importance of pressure-relief to prevent any sores particularly when in prolonged sitting and patient also informed of importance of doing this particularly when it is hot and sweat and moisture will be more of a factor.  ASSESSMENT:  CLINICAL IMPRESSION:  Patient is a 63 y.o. M who was seen today for physical therapy evaluation for wheelchair.  Patient has adequate upper extremity strength and propel manual wheelchair. Patient's current mobility base is very inadequate for his current impairments.  The current brake system is basically nonfunctional.  Patient's seat cushion is also not providing any cushion or diffusion of force on his sacrum which may place him at increased risk for pressure ulcers when utilizing this for long periods of time, particularly with his medical history in mind.  Patient will benefit from a light weight manual wheelchair in order to allow him to apply for jobs as well as to improve his access to his community and his home.  OBJECTIVE IMPAIRMENTS: Abnormal gait and decreased activity tolerance.   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   REHAB POTENTIAL: Good  CLINICAL DECISION MAKING: Stable/uncomplicated  EVALUATION COMPLEXITY: Moderate  GOALS: Target date: 02/28/2024    Pt and caregivers will understand PT recommendation and appropriate/safe use for wheelchair and seating for home  use.   PLAN:  PT FREQUENCY: one time visit  PT DURATION: 1 week  PLANNED INTERVENTIONS: 97110-Therapeutic exercises, 97530- Therapeutic activity, [id number]- Neuromuscular re-education, 97535- Self Care, [id number]- Manual therapy, and Wheelchair mobility training.  PLAN FOR NEXT SESSION: N/A   Lonni KATHEE Gainer, PT 02/14/2024, 4:40 PM

## 2024-02-14 ENCOUNTER — Encounter: Payer: Self-pay | Admitting: Physical Therapy

## 2024-02-14 ENCOUNTER — Ambulatory Visit: Payer: MEDICAID | Attending: Nurse Practitioner | Admitting: Physical Therapy

## 2024-02-14 DIAGNOSIS — R269 Unspecified abnormalities of gait and mobility: Secondary | ICD-10-CM | POA: Diagnosis present

## 2024-03-01 ENCOUNTER — Encounter: Payer: Self-pay | Admitting: Emergency Medicine

## 2024-04-18 ENCOUNTER — Emergency Department
Admission: EM | Admit: 2024-04-18 | Discharge: 2024-04-18 | Disposition: A | Discharge status: Home / Self Care | Payer: MEDICAID | Attending: Emergency Medicine | Admitting: Emergency Medicine

## 2024-04-18 ENCOUNTER — Other Ambulatory Visit: Payer: Self-pay

## 2024-04-18 DIAGNOSIS — K0889 Other specified disorders of teeth and supporting structures: Secondary | ICD-10-CM | POA: Diagnosis present

## 2024-04-18 DIAGNOSIS — I10 Essential (primary) hypertension: Secondary | ICD-10-CM | POA: Diagnosis not present

## 2024-04-18 DIAGNOSIS — E119 Type 2 diabetes mellitus without complications: Secondary | ICD-10-CM | POA: Insufficient documentation

## 2024-04-18 MED ORDER — KETOROLAC TROMETHAMINE 15 MG/ML IJ SOLN
15.0000 mg | Freq: Once | INTRAMUSCULAR | Status: AC
  Administered 2024-04-18: 15 mg via INTRAMUSCULAR
  Filled 2024-04-18: qty 1

## 2024-04-18 MED ORDER — LIDOCAINE VISCOUS HCL 2 % MT SOLN
15.0000 mL | Freq: Once | OROMUCOSAL | Status: AC
  Administered 2024-04-18: 15 mL via OROMUCOSAL
  Filled 2024-04-18: qty 15

## 2024-04-18 MED ORDER — HYDROCODONE-ACETAMINOPHEN 5-325 MG PO TABS: 1.0000 | ORAL_TABLET | ORAL | 0 refills | Status: AC | PRN

## 2024-04-18 NOTE — ED Provider Notes (Signed)
 [facility] Emergency Department Provider Note     Event Date/Time   First MD Initiated Contact with Patient 04/18/24 1340     (approximate)   History   Dental Pain   HPI  [name] is a 63 y.o. male with a past medical history of HTN, diabetes to the ED for evaluation of tooth #3 and tooth # 30 pain x 3 days.  Patient reports he has been taking Tylenol  with no relief.  He does not currently have a dentist as he is waiting for his Medicare to become active in January.  He denies fever or chills, discharge or injury.  He is requesting for the teeth to be taken out.  No other complaint.     Physical Exam   Triage Vital Signs: ED Triage Vitals  Encounter Vitals Group     BP 04/18/24 1227 (!) 149/92     Girls Systolic BP Percentile --      Girls Diastolic BP Percentile --      Boys Systolic BP Percentile --      Boys Diastolic BP Percentile --      Pulse Rate 04/18/24 1227 78     Resp 04/18/24 1227 16     Temp 04/18/24 1227 98.1 F (36.7 C)     Temp Source 04/18/24 1227 Oral     SpO2 04/18/24 1227 97 %     Weight 04/18/24 1228 196 lb 3.4 oz (89 kg)     Height --      Head Circumference --      Peak Flow --      Pain Score 04/18/24 1227 8     Pain Loc --      Pain Education --      Exclude from Growth Chart --     Most recent vital signs: Vitals:   04/18/24 1227  BP: (!) 149/92  Pulse: 78  Resp: 16  Temp: 98.1 F (36.7 C)  SpO2: 97%    General Awake, no distress.  HEENT NCAT.  CV:  Good peripheral perfusion.  RESP:  Normal effort.  ABD:  No distention.  Other:  Overall poor detention.  Multiple missing teeth.  No visual or palpable abscess to right lower or upper gumline.  Tooth #3 is non mobile -tender to palpation over gumline.  Tooth #30 is nonmobile and tender to palpation over gumline.   ED Results / Procedures / Treatments   Labs (all labs ordered are listed, but only abnormal results are displayed) Labs Reviewed -  No data to display  No results found.  PROCEDURES:  Critical Care performed: No  Procedures  MEDICATIONS ORDERED IN ED: Medications  ketorolac  (TORADOL ) 15 MG/ML injection 15 mg (has no administration in time range)  lidocaine  (XYLOCAINE ) 2 % viscous mouth solution 15 mL (has no administration in time range)   IMPRESSION / MDM / ASSESSMENT AND PLAN / ED COURSE  I reviewed the triage vital signs and the nursing notes.                               63 y.o. male presents to the emergency department for evaluation and treatment of dental pain. See HPI for further details.   Differential diagnosis includes, but is not limited to dental pain due to caries, pulpitis, gingivitis, broken tooth, dental abscess  Patient's presentation is most consistent with acute, uncomplicated illness.  Patient is  alert and oriented.  He is hemodynamic stable and afebrile.  Physical exam findings are stated above.  No palpable or visualized abscess to indicate incision and drainage at this time.  No indication or systemic symptoms to require antibiotics.  Will send short course of pain medicine to pharmacy and advise close follow-up with dentist.  A list was provided to the patient at discharge.  He is in stable and satisfactory condition for discharge home.  ED return precautions discussed.  FINAL CLINICAL IMPRESSION(S) / ED DIAGNOSES   Final diagnoses:  Pain, dental    Rx / DC Orders   ED Discharge Orders          Ordered    HYDROcodone -acetaminophen  (NORCO/VICODIN) 5-325 MG tablet  Every 4 hours PRN        04/18/24 1402           Note:  This document was prepared using Dragon voice recognition software and may include unintentional dictation errors.    Margrette, Izmael Duross A, PA-C 04/18/24 1422    Waymond Lorelle Cummins, MD 04/18/24 [phone]

## 2024-04-18 NOTE — Discharge Instructions (Addendum)
 Please call and schedule an appointment for further management with a dentist.  A list has been provided for you at the end of your discharge papers.  Gargle with warm salt water several times daily.   If your Medicaid is unable to cover the prescribed medication, naproxen  OTC is a great alternative.  Also consider topical Orajel to place on the affected tooth.

## 2024-04-18 NOTE — ED Triage Notes (Signed)
 C/O right sided (upper and lower) dental pain since Monday.

## 2024-05-10 ENCOUNTER — Observation Stay
Admission: EM | Admit: 2024-05-10 | Discharge: 2024-05-11 | Disposition: A | Discharge status: Home Health | Payer: MEDICAID | Attending: Internal Medicine | Admitting: Internal Medicine

## 2024-05-10 ENCOUNTER — Other Ambulatory Visit: Payer: Self-pay

## 2024-05-10 ENCOUNTER — Emergency Department: Payer: MEDICAID

## 2024-05-10 DIAGNOSIS — F39 Unspecified mood [affective] disorder: Secondary | ICD-10-CM | POA: Diagnosis not present

## 2024-05-10 DIAGNOSIS — R202 Paresthesia of skin: Secondary | ICD-10-CM | POA: Diagnosis present

## 2024-05-10 DIAGNOSIS — Z794 Long term (current) use of insulin: Secondary | ICD-10-CM | POA: Insufficient documentation

## 2024-05-10 DIAGNOSIS — I6389 Other cerebral infarction: Secondary | ICD-10-CM | POA: Diagnosis not present

## 2024-05-10 DIAGNOSIS — F17200 Nicotine dependence, unspecified, uncomplicated: Secondary | ICD-10-CM | POA: Diagnosis present

## 2024-05-10 DIAGNOSIS — I639 Cerebral infarction, unspecified: Secondary | ICD-10-CM | POA: Diagnosis present

## 2024-05-10 DIAGNOSIS — E1165 Type 2 diabetes mellitus with hyperglycemia: Secondary | ICD-10-CM | POA: Insufficient documentation

## 2024-05-10 DIAGNOSIS — E785 Hyperlipidemia, unspecified: Secondary | ICD-10-CM | POA: Diagnosis not present

## 2024-05-10 DIAGNOSIS — N400 Enlarged prostate without lower urinary tract symptoms: Secondary | ICD-10-CM

## 2024-05-10 DIAGNOSIS — F329 Major depressive disorder, single episode, unspecified: Secondary | ICD-10-CM | POA: Diagnosis present

## 2024-05-10 DIAGNOSIS — Z6826 Body mass index (BMI) 26.0-26.9, adult: Secondary | ICD-10-CM | POA: Insufficient documentation

## 2024-05-10 DIAGNOSIS — E663 Overweight: Secondary | ICD-10-CM

## 2024-05-10 DIAGNOSIS — F17220 Nicotine dependence, chewing tobacco, uncomplicated: Secondary | ICD-10-CM | POA: Insufficient documentation

## 2024-05-10 DIAGNOSIS — R42 Dizziness and giddiness: Principal | ICD-10-CM

## 2024-05-10 DIAGNOSIS — I10 Essential (primary) hypertension: Secondary | ICD-10-CM | POA: Diagnosis not present

## 2024-05-10 DIAGNOSIS — I739 Peripheral vascular disease, unspecified: Secondary | ICD-10-CM | POA: Diagnosis present

## 2024-05-10 DIAGNOSIS — F32A Depression, unspecified: Secondary | ICD-10-CM | POA: Diagnosis present

## 2024-05-10 LAB — COMPREHENSIVE METABOLIC PANEL WITH GFR
ALT: 15 U/L (ref 0–44)
AST: 12 U/L — ABNORMAL LOW (ref 15–41)
Albumin: 3.8 g/dL (ref 3.5–5.0)
Alkaline Phosphatase: 63 U/L (ref 38–126)
Anion gap: 13 (ref 5–15)
BUN: 14 mg/dL (ref 8–23)
CO2: 21 mmol/L — ABNORMAL LOW (ref 22–32)
Calcium: 8.9 mg/dL (ref 8.9–10.3)
Chloride: 104 mmol/L (ref 98–111)
Creatinine, Ser: 0.58 mg/dL — ABNORMAL LOW (ref 0.61–1.24)
GFR, Estimated: >60 mL/min (ref >60)
Glucose, Bld: 327 mg/dL — ABNORMAL HIGH (ref 70–99)
Potassium: 4 mmol/L (ref 3.5–5.1)
Sodium: 138 mmol/L (ref 135–145)
Total Bilirubin: 0.6 mg/dL (ref 0.0–1.2)
Total Protein: 6.9 g/dL (ref 6.5–8.1)

## 2024-05-10 LAB — CBC
HCT: 48 % (ref 39.0–52.0)
HCT: 48.2 % (ref 39.0–52.0)
Hemoglobin: 16.2 g/dL (ref 13.0–17.0)
Hemoglobin: 16.4 g/dL (ref 13.0–17.0)
MCH: 31.7 pg (ref 26.0–34.0)
MCH: 31.8 pg (ref 26.0–34.0)
MCHC: 33.8 g/dL (ref 30.0–36.0)
MCHC: 34 g/dL (ref 30.0–36.0)
MCV: 93.9 fL (ref 80.0–100.0)
MCV: 93.4 fL (ref 80.0–100.0)
Platelets: 153 K/uL (ref 150–400)
Platelets: 156 K/uL (ref 150–400)
RBC: 5.11 MIL/uL (ref 4.22–5.81)
RBC: 5.16 MIL/uL (ref 4.22–5.81)
RDW: 12.2 % (ref 11.5–15.5)
RDW: 12.2 % (ref 11.5–15.5)
WBC: 7.4 K/uL (ref 4.0–10.5)
WBC: 7.7 K/uL (ref 4.0–10.5)
nRBC: 0 % (ref 0.0–0.2)
nRBC: 0 % (ref 0.0–0.2)

## 2024-05-10 LAB — URINALYSIS, ROUTINE W REFLEX MICROSCOPIC
Bacteria, UA: NONE SEEN
Bilirubin Urine: NEGATIVE
Glucose, UA: >=500 mg/dL — AB
Hgb urine dipstick: NEGATIVE
Ketones, ur: NEGATIVE mg/dL
Leukocytes,Ua: NEGATIVE
Nitrite: NEGATIVE
Protein, ur: NEGATIVE mg/dL
Specific Gravity, Urine: 1.031 — ABNORMAL HIGH (ref 1.005–1.030)
Squamous Epithelial / HPF: 0 /HPF (ref 0–5)
pH: 7 (ref 5.0–8.0)

## 2024-05-10 LAB — CREATININE, SERUM
Creatinine, Ser: 0.67 mg/dL (ref 0.61–1.24)
GFR, Estimated: >60 mL/min (ref >60)

## 2024-05-10 LAB — HEMOGLOBIN A1C
Hgb A1c MFr Bld: 8.9 % — ABNORMAL HIGH (ref 4.8–5.6)
Mean Plasma Glucose: 208.73 mg/dL

## 2024-05-10 LAB — LIPASE, BLOOD: Lipase: 30 U/L (ref 11–51)

## 2024-05-10 LAB — GLUCOSE, CAPILLARY: Glucose-Capillary: 196 mg/dL — ABNORMAL HIGH (ref 70–99)

## 2024-05-10 MED ORDER — ENOXAPARIN SODIUM 40 MG/0.4ML IJ SOSY
40.0000 mg | PREFILLED_SYRINGE | INTRAMUSCULAR | Status: DC
  Administered 2024-05-10: 40 mg via SUBCUTANEOUS
  Filled 2024-05-10: qty 0.4

## 2024-05-10 MED ORDER — CLOPIDOGREL BISULFATE 75 MG PO TABS
75.0000 mg | ORAL_TABLET | Freq: Every day | ORAL | Status: DC
  Administered 2024-05-10 – 2024-05-11 (×2): 75 mg via ORAL
  Filled 2024-05-10 (×2): qty 1

## 2024-05-10 MED ORDER — HYDROCODONE-ACETAMINOPHEN 5-325 MG PO TABS
1.0000 | ORAL_TABLET | ORAL | Status: DC | PRN
  Administered 2024-05-11 (×2): 1 via ORAL
  Filled 2024-05-10 (×2): qty 1

## 2024-05-10 MED ORDER — ASPIRIN 81 MG PO TBEC
81.0000 mg | DELAYED_RELEASE_TABLET | Freq: Every day | ORAL | Status: DC
  Administered 2024-05-11: 81 mg via ORAL
  Filled 2024-05-10: qty 1

## 2024-05-10 MED ORDER — ONDANSETRON HCL 4 MG/2ML IJ SOLN
4.0000 mg | Freq: Once | INTRAMUSCULAR | Status: AC
  Administered 2024-05-10: 4 mg via INTRAVENOUS
  Filled 2024-05-10: qty 2

## 2024-05-10 MED ORDER — INSULIN ASPART 100 UNIT/ML IJ SOLN: 0.0000 [IU] | Freq: Every day | INTRAMUSCULAR | Status: DC

## 2024-05-10 MED ORDER — SENNOSIDES-DOCUSATE SODIUM 8.6-50 MG PO TABS: 1.0000 | ORAL_TABLET | Freq: Every evening | ORAL | Status: DC | PRN

## 2024-05-10 MED ORDER — INSULIN ASPART 100 UNIT/ML IJ SOLN
0.0000 [IU] | Freq: Three times a day (TID) | INTRAMUSCULAR | Status: DC
  Administered 2024-05-11: 2 [IU] via SUBCUTANEOUS
  Filled 2024-05-10: qty 1

## 2024-05-10 MED ORDER — ACETAMINOPHEN 325 MG PO TABS: 650.0000 mg | ORAL_TABLET | ORAL | Status: DC | PRN

## 2024-05-10 MED ORDER — ATORVASTATIN CALCIUM 20 MG PO TABS
20.0000 mg | ORAL_TABLET | Freq: Every day | ORAL | Status: DC
  Administered 2024-05-10: 20 mg via ORAL
  Filled 2024-05-10: qty 1

## 2024-05-10 MED ORDER — SODIUM CHLORIDE 0.9 % IV SOLN: INTRAVENOUS | Status: DC

## 2024-05-10 MED ORDER — MORPHINE SULFATE (PF) 4 MG/ML IV SOLN
4.0000 mg | Freq: Once | INTRAVENOUS | Status: AC
  Administered 2024-05-10: 4 mg via INTRAVENOUS
  Filled 2024-05-10: qty 1

## 2024-05-10 MED ORDER — INSULIN GLARGINE-YFGN 100 UNIT/ML ~~LOC~~ SOLN
30.0000 [IU] | Freq: Every day | SUBCUTANEOUS | Status: DC
  Administered 2024-05-10: 30 [IU] via SUBCUTANEOUS
  Filled 2024-05-10 (×2): qty 0.3

## 2024-05-10 MED ORDER — MECLIZINE HCL 25 MG PO TABS
25.0000 mg | ORAL_TABLET | Freq: Once | ORAL | Status: AC
  Administered 2024-05-10: 25 mg via ORAL
  Filled 2024-05-10: qty 1

## 2024-05-10 MED ORDER — METOPROLOL TARTRATE 50 MG PO TABS
50.0000 mg | ORAL_TABLET | Freq: Two times a day (BID) | ORAL | Status: DC
  Administered 2024-05-10 – 2024-05-11 (×2): 50 mg via ORAL
  Filled 2024-05-10 (×2): qty 1

## 2024-05-10 MED ORDER — ASPIRIN 81 MG PO CHEW
324.0000 mg | CHEWABLE_TABLET | Freq: Once | ORAL | Status: AC
  Administered 2024-05-10: 324 mg via ORAL
  Filled 2024-05-10: qty 4

## 2024-05-10 MED ORDER — STROKE: EARLY STAGES OF RECOVERY BOOK: Freq: Once | Status: AC

## 2024-05-10 MED ORDER — ACETAMINOPHEN 650 MG RE SUPP: 650.0000 mg | RECTAL | Status: DC | PRN

## 2024-05-10 MED ORDER — BUSPIRONE HCL 10 MG PO TABS
10.0000 mg | ORAL_TABLET | Freq: Two times a day (BID) | ORAL | Status: DC
  Administered 2024-05-10 – 2024-05-11 (×2): 10 mg via ORAL
  Filled 2024-05-10 (×2): qty 1

## 2024-05-10 MED ORDER — ACETAMINOPHEN 160 MG/5ML PO SOLN: 650.0000 mg | ORAL | Status: DC | PRN

## 2024-05-10 MED ORDER — LACTATED RINGERS IV BOLUS
1000.0000 mL | Freq: Once | INTRAVENOUS | Status: AC
  Administered 2024-05-10: 1000 mL via INTRAVENOUS

## 2024-05-10 NOTE — ED Notes (Signed)
 Called [facility] to initiate cardiac monitoring.

## 2024-05-10 NOTE — ED Triage Notes (Signed)
 Pt reports dizziness, generalized fatigue, numbness and tingling at base of neck to bilateral arms and hands started yesterday 1000. +nausea. Right AKA

## 2024-05-10 NOTE — H&P (Signed)
 History and Physical    [name] FMW:[id number] DOB: 09-28-1960 DOA: 05/10/2024  DOS: the patient was seen and examined on 05/10/2024  PCP: Osa Geralds, NP   Patient coming from: Group home  I have personally briefly reviewed patient's old medical records in [facility]  Chief Complaint: Tingling/weakness/dizziness/pain around the neck on the left sides started 1 day prior to coming to ED  HPI: [name] is a pleasant 63 y.o. male with medical history significant for PAD s/p right AKA, DM, history of DVT, HTN who came into ED at [facility] from group home for generalized weakness mostly left-sided, tingling, numbness, dizziness and pain around left neck started yesterday.  Patient is a group home resident, wheelchair-bound.  Normally he transfers from bed to wheelchair himself.  Yesterday he had a hard time getting transferred from bed to wheelchair, he felt dizzy, weak, had some tingling and numbness sensation on his left neck, left upper and lower extremities.  He never had history of stroke, denies any heart problems, denies chest pain, shortness of breath, palpitations.  He denies any leg swelling.  He denies any fever, chills, hematemesis, melena.  ED Course: Upon arrival to the ED, patient is found to be hypertensive at 147/81, afebrile with temperature of 97.5, saturating 95%, glucose of 327, creatinine 0.58, EKG shows sinus rhythm at 62 bpm, CT head and cervical spines were done which showed no acute pathology.  MRI of the cervical spine and brain was done which showed 1.7 mm acute infarct in the left lateral medulla.  Absent flow void in the distal left vertebral artery which may reflect occlusion.  Hospitalist service was consulted for evaluation for admission for acute stroke.  Review of Systems:  ROS  All other systems negative except as noted in the HPI.  Past Medical History:  Diagnosis Date   Arthritis    Blood clot in vein    Diabetes mellitus    DVT (deep  venous thrombosis) (HCC)    Hypertension    Neuropathy    Type 2 diabetes mellitus with diabetic neuropathy, unspecified (HCC) 02/18/2020    Past Surgical History:  Procedure Laterality Date   AMPUTATION Right 07/01/2021   Procedure: AMPUTATION DIGIT;  Surgeon: Tobie Franky SQUIBB, DPM;  Location: [facility] ORS;  Service: Podiatry;  Laterality: Right;   APPENDECTOMY     INCISION AND DRAINAGE Right 03/20/2022   Procedure: ABOVE THE KNEE AMPUTATION;  Surgeon: Tisa Curry LABOR, MD;  Location: [facility] ORS;  Service: Vascular;  Laterality: Right;   LOWER EXTREMITY ANGIOGRAPHY Right 01/27/2020   Procedure: LOWER EXTREMITY ANGIOGRAPHY;  Surgeon: Marea Selinda RAMAN, MD;  Location: [facility] INVASIVE CV LAB;  Service: Cardiovascular;  Laterality: Right;   VEIN LIGATION AND STRIPPING     of left leg     reports that he has been smoking cigarettes. He has a 6 pack-year smoking history. He has never used smokeless tobacco. He reports that he does not currently use alcohol after a past usage of about 3.0 standard drinks of alcohol per week. He reports that he does not use drugs.  Allergies  Allergen Reactions   Erythromycin Base Anxiety   Erythromycin Base Anaphylaxis, Anxiety and Other (See Comments)   Azithromycin Anxiety   Benzodiazepines     PT STATES HE CANNOT TAKE THESE BECAUSE THEY ALMOST KILLED HIM  Other reaction(s): Other (See Comments)  Tachycardia  PT STATES HE CANNOT TAKE THESE BECAUSE THEY ALMOST KILLED HIM   Xanax [Alprazolam] Other (See Comments)  Tachycardia   Hydrocodone  Other (See Comments)    nightmares   Methocarbamol Anxiety and Other (See Comments)    Stomach cramping, weakness   Methocarbamol Anxiety and Other (See Comments)    Stomach cramping, weakness   Sulfa Antibiotics Nausea And Vomiting and Other (See Comments)    Cramping in stomach and hyperventilate.     Family History  Problem Relation Age of Onset   Heart disease Mother    Diabetes Father    Heart disease Father     Diabetes Brother     Prior to Admission medications   Medication Sig Start Date End Date Taking? Authorizing Provider  aspirin  EC 81 MG tablet Take 1 tablet (81 mg total) by mouth daily. Swallow whole. 03/24/22   Sreenath, Calvin NOVAK, MD  atorvastatin  (LIPITOR) 20 MG tablet Take 20 mg by mouth daily. 02/22/22   [provider]  clopidogrel  (PLAVIX ) 75 MG tablet Take 1 tablet (75 mg total) by mouth daily. Patient not taking: Reported on 01/20/2021 01/27/20   Marea Selinda RAMAN, MD  cyclobenzaprine  (FLEXERIL ) 10 MG tablet Take 10 mg by mouth 3 (three) times daily as needed for muscle spasms.    [provider]  gabapentin  (NEURONTIN ) 300 MG capsule TAKE 1 CAPSULE BY MOUTH 3 TIMES A DAY 01/20/21 03/18/22  Iloabachie, Chioma E, NP  glucose blood (TRUE METRIX BLOOD GLUCOSE TEST) test strip Use as instructed 01/31/17   Jegede, Olugbemiga E, MD  HYDROcodone -acetaminophen  (NORCO/VICODIN) 5-325 MG tablet Take 1 tablet by mouth every 4 (four) hours as needed for moderate pain (pain score 4-6). 04/18/24   Margrette, Myah A, PA-C  insulin  glargine (LANTUS  SOLOSTAR) 100 UNIT/ML Solostar Pen Inject 30 Units into the skin at bedtime.    [provider]  metFORMIN  (GLUCOPHAGE ) 1000 MG tablet TAKE ONE TABLET BY MOUTH 2 TIMES A DAY WITH A MEAL 12/24/20 03/18/22  Iloabachie, Chioma E, NP  metoprolol  tartrate (LOPRESSOR ) 50 MG tablet TAKE 1 TABLET BY MOUTH 2 TIMES A DAY 12/24/20 03/18/22  Iloabachie, Chioma E, NP  mupirocin  ointment (BACTROBAN ) 2 % Apply 1 application topically 2 (two) times daily. 06/02/21   Silva Juliene SAUNDERS, DPM    Physical Exam: Vitals:   05/10/24 1330 05/10/24 1624 05/10/24 1630 05/10/24 1700  BP: 137/85 108/61 126/63 (!) 140/80  Pulse: (!) 58 68 68 63  Resp: 16 16 (!) 22 17  Temp:  97.9 F (36.6 C)    TempSrc:  Oral    SpO2: 94% 95% 95% 94%  Weight:      Height:        Physical Exam   Constitutional: Alert, awake, calm, comfortable HEENT: Neck supple Respiratory: Clear  to auscultation B/L, no wheezing, no rales.  Cardiovascular: Regular rate and rhythm, no murmurs / rubs / gallops. No extremity edema. 2+ pedal pulses. No carotid bruits.  Abdomen: Soft, no tenderness, Bowel sounds positive.  Musculoskeletal: Right AKA.  Skin: no rashes, lesions, ulcers. Neurologic: CN 2-12 grossly intact, normal power of left upper and lower extremity Psychiatric: Alert and oriented x 3. Normal mood.    Labs on Admission: I have personally reviewed following labs and imaging studies  CBC: Recent Labs  Lab 05/10/24 0928  WBC 7.4  HGB 16.2  HCT 48.0  MCV 93.9  PLT 153   Basic Metabolic Panel: Recent Labs  Lab 05/10/24 0928  NA 138  K 4.0  CL 104  CO2 21*  GLUCOSE 327*  BUN 14  CREATININE 0.58*  CALCIUM  8.9  GFR: Estimated Creatinine Clearance: 99.4 mL/min (A) (by C-G formula based on SCr of 0.58 mg/dL (L)). Liver Function Tests: Recent Labs  Lab 05/10/24 0928  AST 12*  ALT 15  ALKPHOS 63  BILITOT 0.6  PROT 6.9  ALBUMIN 3.8   Recent Labs  Lab 05/10/24 0928  LIPASE 30   No results for input(s): AMMONIA in the last 168 hours. Coagulation Profile: No results for input(s): INR, PROTIME in the last 168 hours. Cardiac Enzymes: No results for input(s): CKTOTAL, CKMB, CKMBINDEX, TROPONINI, TROPONINIHS in the last 168 hours. BNP (last 3 results) No results for input(s): BNP in the last 8760 hours. HbA1C: No results for input(s): HGBA1C in the last 72 hours. CBG: No results for input(s): GLUCAP in the last 168 hours. Lipid Profile: No results for input(s): CHOL, HDL, LDLCALC, TRIG, CHOLHDL, LDLDIRECT in the last 72 hours. Thyroid Function Tests: No results for input(s): TSH, T4TOTAL, FREET4, T3FREE, THYROIDAB in the last 72 hours. Anemia Panel: No results for input(s): VITAMINB12, FOLATE, FERRITIN, TIBC, IRON, RETICCTPCT in the last 72 hours. Urine analysis:    Component Value Date/Time    COLORURINE STRAW (A) 05/10/2024 0928   APPEARANCEUR CLEAR (A) 05/10/2024 0928   APPEARANCEUR Clear 09/18/2019 1221   LABSPEC 1.031 (H) 05/10/2024 0928   LABSPEC 1.015 04/13/2012 1110   PHURINE 7.0 05/10/2024 0928   GLUCOSEU >=500 (A) 05/10/2024 0928   GLUCOSEU Negative 04/13/2012 1110   HGBUR NEGATIVE 05/10/2024 0928   BILIRUBINUR NEGATIVE 05/10/2024 0928   BILIRUBINUR Negative 09/18/2019 1221   BILIRUBINUR Negative 04/13/2012 1110   KETONESUR NEGATIVE 05/10/2024 0928   PROTEINUR NEGATIVE 05/10/2024 0928   NITRITE NEGATIVE 05/10/2024 0928   LEUKOCYTESUR NEGATIVE 05/10/2024 0928   LEUKOCYTESUR Negative 04/13/2012 1110    Radiological Exams on Admission: I have personally reviewed images MR Cervical Spine Wo Contrast Result Date: 05/10/2024 EXAM: MRI CERVICAL SPINE WITHOUT CONTRAST 05/10/2024 02:25:32 PM TECHNIQUE: Multiplanar multisequence MRI of the cervical spine was performed. COMPARISON: CT cervical spine 05/10/2024. CLINICAL HISTORY: neck pain around C4-6, R>L hand weakness FINDINGS: BONES AND ALIGNMENT: Normal alignment. No fracture or suspicious marrow lesion. Mild degenerative endplate changes at C6-7, including very mild edema. SPINAL CORD: Normal spinal cord size. No abnormal spinal cord signal. SOFT TISSUES: No paraspinal mass. Absent left vertebral artery flow void. C2-C3: Mild facet arthrosis without stenosis. C3-C4: Disc bulging, uncovertebral spurring, and mild facet arthrosis result in mild right and mild to moderate left neural foraminal stenosis without spinal stenosis. C4-C5: Disc bulging, uncovertebral spurring, and moderate left facet arthrosis result in mild right and moderate to severe left neural foraminal stenosis without spinal canal stenosis. C5-C6: Disc bulging, uncovertebral spurring, and mild facet arthrosis result in mild to moderate right and mild left neural foraminal stenosis without spinal stenosis. C6-C7: Mild disc space narrowing. Disc bulging and  uncovertebral spurring result in mild spinal stenosis and moderate to severe right and moderate left neural foraminal stenosis. C7-T1: Negative. IMPRESSION: 1. Cervical spondylosis, greatest at C6-C7 where there is mild spinal stenosis and moderate to severe right and moderate left neural foraminal stenosis. 2. Moderate to severe left neural foraminal stenosis at C4-5. 3. Absent left vertebral artery flow void likely reflecting occlusion. Electronically signed by: Dasie Hamburg MD 05/10/2024 02:54 PM EST RP Workstation: [id number]   MR BRAIN WO CONTRAST Result Date: 05/10/2024 EXAM: MRI BRAIN WITHOUT CONTRAST 05/10/2024 02:25:32 PM TECHNIQUE: Multiplanar multisequence MRI of the head/brain was performed without the administration of intravenous contrast. COMPARISON: Head CT 05/10/2024. CLINICAL HISTORY: Stroke.  Vasculopath with 24hrs left leg weakness, dizziness, falling towards left. FINDINGS: BRAIN AND VENTRICLES: There is a 7 mm acute infarct in the left lateral medulla. No intracranial hemorrhage, mass, midline shift, hydrocephalus, or extra-axial fluid collection is identified. There is mild cerebral atrophy. Scattered punctate T2 hyperintensities in the cerebral white matter bilaterally are nonspecific but compatible with minimal chronic small vessel ischemic disease. Absent flow void in the distal left vertebral artery. ORBITS: No acute abnormality. SINUSES AND MASTOIDS: Widespread chronic sinusitis as noted on CT including extensive chronic osteitis involving the left maxillary sinus. A small to moderate-sized fluid level in the right maxillary sinus may reflect acute on chronic sinusitis. No significant mastoid fluid. BONES AND SOFT TISSUES: Normal marrow signal. No acute soft tissue abnormality. IMPRESSION: 1. 7 mm acute infarct in the left lateral medulla. 2. Absent flow void in the distal left vertebral artery which may reflect occlusion. Electronically signed by: Dasie Hamburg MD 05/10/2024 02:47 PM EST  RP Workstation: [id number]   CT Cervical Spine Wo Contrast Result Date: 05/10/2024 EXAM: CT CERVICAL SPINE WITHOUT CONTRAST 05/10/2024 10:31:50 AM TECHNIQUE: CT of the cervical spine was performed without the administration of intravenous contrast. Multiplanar reformatted images are provided for review. Automated exposure control, iterative reconstruction, and/or weight based adjustment of the mA/kV was utilized to reduce the radiation dose to as low as reasonably achievable. COMPARISON: CT head today, reported separately. Cervical spine radiographs 11/30/2018. CLINICAL HISTORY: 63 year old male. New dizziness, leaning to left, bilateral hand tingling. FINDINGS: CERVICAL SPINE: BONES AND ALIGNMENT: No acute fracture or traumatic malalignment. Mild straightening of lordosis since 11/30/2018. Mild dextroconvex cervical scoliosis is stable. DEGENERATIVE CHANGES: Multilevel chronic cervical spine disc and endplate degeneration, most pronounced at C6-C7. Up to mild associated cervical spinal stenosis by CT. SOFT TISSUES: No prevertebral soft tissue swelling. Negative visible non-contrast neck soft tissues. Negative visible non-contrast thoracic inlet. IMPRESSION: 1. No acute traumatic injury identified in the cervical spine. 2. Cervical spine degeneration maximal at C6-C7, with up to mild associated cervical canal stenosis. Electronically signed by: Helayne Hurst MD 05/10/2024 10:45 AM EST RP Workstation: [id number]   CT HEAD WO CONTRAST ( ) Result Date: 05/10/2024 EXAM: CT HEAD WITHOUT CONTRAST 05/10/2024 10:31:50 AM TECHNIQUE: CT of the head was performed without the administration of intravenous contrast. Automated exposure control, iterative reconstruction, and/or weight based adjustment of the mA/kV was utilized to reduce the radiation dose to as low as reasonably achievable. COMPARISON: Prior head CT 03/21/2005. CLINICAL HISTORY: 63 year old male with new dizziness, leaning to left, and bilateral hand  tingling. FINDINGS: BRAIN AND VENTRICLES: No acute hemorrhage. No evidence of acute infarct. No hydrocephalus. No extra-axial collection. No mass effect or midline shift. Generalized cerebral volume loss since 2006 no regional disproportionate atrophy identified. Calcified atherosclerosis at the skull base. No suspicious intracranial vascular hyperdensity. Gray white differentiation is within normal limits for age. ORBITS: No acute abnormality. SINUSES: Widespread chronic paranasal sinusitis with mucoperiosteal thickening does not appear significantly changed since 2006. SOFT TISSUES AND SKULL: No acute soft tissue abnormality. No skull fracture. Bilateral middle ears and mastoids are well aerated. Leftward gaze. IMPRESSION: 1. No acute intracranial abnormality. Negative for age non contrast CT appearance of the brain. 2. Widespread chronic paranasal sinusitis with mucoperiosteal thickening not significantly changed since 2006. Electronically signed by: Helayne Hurst MD 05/10/2024 10:41 AM EST RP Workstation: [id number]    EKG: My personal interpretation of EKG shows: Sinus rhythm at 62 bpm    Assessment/Plan Principal Problem:  Acute stroke due to ischemia [facility]) Active Problems:   Essential hypertension   Tobacco use disorder   Dyslipidemia   MDD (major depressive disorder)   Peripheral vascular disease    Assessment and Plan: 63 year old male with history of HTN, HLD, chronic tobacco use, peripheral vascular disease, depression who was brought in from group home for dizziness, generalized weakness, left-sided tingling and numbness and MRI of the brain showed acute stroke in the left lateral medulla.  1.  Acute ischemic stroke of left lateral medulla with absent flow void in the distal left vertebral artery - He will be admitted to hospital as inpatient for acute stroke - Patient is outside the window for TNK - Neurology evaluation - Aspirin  and Plavix  at home dose - Echocardiogram, lipid  panel, PT/OT/ST  2.  HTN - Permissive hypertension at this point - Continue monitor blood pressure  3.  HLD - Continue statin Lipitor at home dose  4.  Type 2 diabetes - Continue Lantus  at 30 units at bedtime, hold oral hypoglycemic medications - Continue to monitor blood glucose   5.  Anxiety/depression - Continue buspirone  10 mg twice daily    DVT prophylaxis: Lovenox  Code Status: Full Code Family Communication: None Disposition Plan: Back to group home Consults called: Neurology Admission status: Inpatient, progressive   Nena Rebel, MD [facility] 05/10/2024, 5:23 PM

## 2024-05-10 NOTE — ED Triage Notes (Signed)
 First nurse note: Pt to ED via [facility] from group home ([address], [place], [id number]). Pt reports yesterday started to having tingling/pain sensation that started in back of neck and radiated down both arms. Pt reports increased weakness since yesterday.  CBG [phone]

## 2024-05-10 NOTE — ED Notes (Signed)
 Patient transported to CT

## 2024-05-10 NOTE — ED Provider Notes (Signed)
 [facility] Provider Note    Event Date/Time   First MD Initiated Contact with Patient 05/10/24 [phone]     (approximate)   History   Dizziness   HPI  [name] is a 63 y.o. male who presents to the ED for evaluation of Dizziness   Review a [facility] vascular surgery clinic visit from 2 years ago.  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  Resides at a local group home and dependent upon a wheelchair due to his right sided AKA.  Reports difficulty doing typical ADLs due to this sensation of leaning to the left.  His fingerstick glucose is unusually high.  Reports nausea without emesis, abdominal pain or stool/urinary changes.  Regular smoker without increased sputum production or sensation of shortness of breath.  No chest pain.  Reports of sharp sensation to his neck and paresthesias/tingling of bilateral hands symmetrically without weakness   Physical Exam   Triage Vital Signs: ED Triage Vitals  Encounter Vitals Group     BP 05/10/24 0833 (!) 147/81     Girls Systolic BP Percentile --      Girls Diastolic BP Percentile --      Boys Systolic BP Percentile --      Boys Diastolic BP Percentile --      Pulse Rate 05/10/24 0833 84     Resp 05/10/24 0833 18     Temp 05/10/24 0833 (!) 97.5 F (36.4 C)     Temp src --      SpO2 05/10/24 0833 95 %     Weight 05/10/24 0836 190 lb (86.2 kg)     Height 05/10/24 0836 5' 11 (1.803 m)     Head Circumference --      Peak Flow --      Pain Score 05/10/24 0836 5     Pain Loc --      Pain Education --      Exclude from Growth Chart --     Most recent vital signs: Vitals:   05/10/24 1300 05/10/24 1312  BP: 136/89   Pulse: 65   Resp: 13   Temp:  98.8 F (37.1 C)  SpO2: 95%     General: Awake, no distress.   Able to sit upright independently but develops dizziness and has to lay back down. CV:  Good peripheral perfusion.  Resp:  Normal effort.  Abd:  No distention.  Soft and nontender MSK:  No deformity noted.  Right-sided AKA, left leg is well-perfused Neuro:  No focal deficits appreciated. Other:     ED Results / Procedures / Treatments   Labs (all labs ordered are listed, but only abnormal results are displayed) Labs Reviewed  COMPREHENSIVE METABOLIC PANEL WITH GFR - Abnormal; Notable for the following components:      Result Value   CO2 21 (*)    Glucose, Bld 327 (*)    Creatinine, Ser 0.58 (*)    AST 12 (*)    All other components within normal limits  URINALYSIS, ROUTINE W REFLEX MICROSCOPIC - Abnormal; Notable for the following components:   Color, Urine STRAW (*)    APPearance CLEAR (*)    Specific Gravity, Urine 1.031 (*)    Glucose, UA >=500 (*)    All other components within  normal limits  CBC  LIPASE, BLOOD    EKG Sinus rhythm with a rate of 62 bpm.  Leftward axis, normal intervals without clear signs of acute ischemia.  RADIOLOGY CT head interpreted by me without evidence of acute intracranial pathology CT cervical spine interpreted by me without evidence of fracture or dislocation  Official radiology report(s): MR Cervical Spine Wo Contrast Result Date: 05/10/2024 EXAM: MRI CERVICAL SPINE WITHOUT CONTRAST 05/10/2024 02:25:32 PM TECHNIQUE: Multiplanar multisequence MRI of the cervical spine was performed. COMPARISON: CT cervical spine 05/10/2024. CLINICAL HISTORY: neck pain around C4-6, R>L hand weakness FINDINGS: BONES AND ALIGNMENT: Normal alignment. No fracture or suspicious marrow lesion. Mild degenerative endplate changes at C6-7, including very mild edema. SPINAL CORD: Normal spinal cord size. No abnormal spinal cord signal. SOFT TISSUES: No paraspinal mass. Absent left vertebral artery flow void. C2-C3: Mild facet arthrosis without stenosis. C3-C4: Disc  bulging, uncovertebral spurring, and mild facet arthrosis result in mild right and mild to moderate left neural foraminal stenosis without spinal stenosis. C4-C5: Disc bulging, uncovertebral spurring, and moderate left facet arthrosis result in mild right and moderate to severe left neural foraminal stenosis without spinal canal stenosis. C5-C6: Disc bulging, uncovertebral spurring, and mild facet arthrosis result in mild to moderate right and mild left neural foraminal stenosis without spinal stenosis. C6-C7: Mild disc space narrowing. Disc bulging and uncovertebral spurring result in mild spinal stenosis and moderate to severe right and moderate left neural foraminal stenosis. C7-T1: Negative. IMPRESSION: 1. Cervical spondylosis, greatest at C6-C7 where there is mild spinal stenosis and moderate to severe right and moderate left neural foraminal stenosis. 2. Moderate to severe left neural foraminal stenosis at C4-5. 3. Absent left vertebral artery flow void likely reflecting occlusion. Electronically signed by: Dasie Hamburg MD 05/10/2024 02:54 PM EST RP Workstation: [id number]   MR BRAIN WO CONTRAST Result Date: 05/10/2024 EXAM: MRI BRAIN WITHOUT CONTRAST 05/10/2024 02:25:32 PM TECHNIQUE: Multiplanar multisequence MRI of the head/brain was performed without the administration of intravenous contrast. COMPARISON: Head CT 05/10/2024. CLINICAL HISTORY: Stroke. Vasculopath with 24hrs left leg weakness, dizziness, falling towards left. FINDINGS: BRAIN AND VENTRICLES: There is a 7 mm acute infarct in the left lateral medulla. No intracranial hemorrhage, mass, midline shift, hydrocephalus, or extra-axial fluid collection is identified. There is mild cerebral atrophy. Scattered punctate T2 hyperintensities in the cerebral white matter bilaterally are nonspecific but compatible with minimal chronic small vessel ischemic disease. Absent flow void in the distal left vertebral artery. ORBITS: No acute abnormality. SINUSES  AND MASTOIDS: Widespread chronic sinusitis as noted on CT including extensive chronic osteitis involving the left maxillary sinus. A small to moderate-sized fluid level in the right maxillary sinus may reflect acute on chronic sinusitis. No significant mastoid fluid. BONES AND SOFT TISSUES: Normal marrow signal. No acute soft tissue abnormality. IMPRESSION: 1. 7 mm acute infarct in the left lateral medulla. 2. Absent flow void in the distal left vertebral artery which may reflect occlusion. Electronically signed by: Dasie Hamburg MD 05/10/2024 02:47 PM EST RP Workstation: [id number]   CT Cervical Spine Wo Contrast Result Date: 05/10/2024 EXAM: CT CERVICAL SPINE WITHOUT CONTRAST 05/10/2024 10:31:50 AM TECHNIQUE: CT of the cervical spine was performed without the administration of intravenous contrast. Multiplanar reformatted images are provided for review. Automated exposure control, iterative reconstruction, and/or weight based adjustment of the mA/kV was utilized to reduce the radiation dose to as low as reasonably achievable. COMPARISON: CT head today, reported separately. Cervical spine radiographs 11/30/2018. CLINICAL HISTORY: 63 year old male. New  dizziness, leaning to left, bilateral hand tingling. FINDINGS: CERVICAL SPINE: BONES AND ALIGNMENT: No acute fracture or traumatic malalignment. Mild straightening of lordosis since 11/30/2018. Mild dextroconvex cervical scoliosis is stable. DEGENERATIVE CHANGES: Multilevel chronic cervical spine disc and endplate degeneration, most pronounced at C6-C7. Up to mild associated cervical spinal stenosis by CT. SOFT TISSUES: No prevertebral soft tissue swelling. Negative visible non-contrast neck soft tissues. Negative visible non-contrast thoracic inlet. IMPRESSION: 1. No acute traumatic injury identified in the cervical spine. 2. Cervical spine degeneration maximal at C6-C7, with up to mild associated cervical canal stenosis. Electronically signed by: Helayne Hurst MD  05/10/2024 10:45 AM EST RP Workstation: [id number]   CT HEAD WO CONTRAST ( ) Result Date: 05/10/2024 EXAM: CT HEAD WITHOUT CONTRAST 05/10/2024 10:31:50 AM TECHNIQUE: CT of the head was performed without the administration of intravenous contrast. Automated exposure control, iterative reconstruction, and/or weight based adjustment of the mA/kV was utilized to reduce the radiation dose to as low as reasonably achievable. COMPARISON: Prior head CT 03/21/2005. CLINICAL HISTORY: 63 year old male with new dizziness, leaning to left, and bilateral hand tingling. FINDINGS: BRAIN AND VENTRICLES: No acute hemorrhage. No evidence of acute infarct. No hydrocephalus. No extra-axial collection. No mass effect or midline shift. Generalized cerebral volume loss since 2006 no regional disproportionate atrophy identified. Calcified atherosclerosis at the skull base. No suspicious intracranial vascular hyperdensity. Gray white differentiation is within normal limits for age. ORBITS: No acute abnormality. SINUSES: Widespread chronic paranasal sinusitis with mucoperiosteal thickening does not appear significantly changed since 2006. SOFT TISSUES AND SKULL: No acute soft tissue abnormality. No skull fracture. Bilateral middle ears and mastoids are well aerated. Leftward gaze. IMPRESSION: 1. No acute intracranial abnormality. Negative for age non contrast CT appearance of the brain. 2. Widespread chronic paranasal sinusitis with mucoperiosteal thickening not significantly changed since 2006. Electronically signed by: Helayne Hurst MD 05/10/2024 10:41 AM EST RP Workstation: [id number]    PROCEDURES and INTERVENTIONS:  .Critical Care  Performed by: Claudene Rover, MD Authorized by: Claudene Rover, MD   Critical care provider statement:    Critical care time (minutes):  30   Critical care time was exclusive of:  Separately billable procedures and treating other patients   Critical care was necessary to treat or prevent imminent or  life-threatening deterioration of the following conditions:  CNS failure or compromise   Critical care was time spent personally by me on the following activities:  Development of treatment plan with patient or surrogate, discussions with consultants, evaluation of patient's response to treatment, examination of patient, ordering and review of laboratory studies, ordering and review of radiographic studies, ordering and performing treatments and interventions, pulse oximetry, re-evaluation of patient's condition and review of old charts .1-3 Lead EKG Interpretation  Performed by: Claudene Rover, MD Authorized by: Claudene Rover, MD     Medications  aspirin  chewable tablet 324 mg (has no administration in time range)  lactated ringers  bolus 1,000 mL (0 mLs Intravenous Stopped 05/10/24 1100)  ondansetron  (ZOFRAN ) injection 4 mg (4 mg Intravenous Given 05/10/24 0911)  morphine  (PF) 4 MG/ML injection 4 mg (4 mg Intravenous Given 05/10/24 0912)  meclizine (ANTIVERT) tablet 25 mg (25 mg Oral Given 05/10/24 1100)     IMPRESSION / MDM / ASSESSMENT AND PLAN / ED COURSE  I reviewed the triage vital signs and the nursing notes.  Differential diagnosis includes, but is not limited to, DKA, HHS, stroke ischemic or hemorrhagic, BPPV, symptomatic hyperglycemia, sepsis, AKI  {Patient presents with symptoms of an acute  illness or injury that is potentially life-threatening.  Patient presents with about 24 hours of dizziness and unsteadiness with signs of an acute stroke requiring medical admission.  No ICH on CT imaging.  MRI with signs of an acute stroke, outside of any window for intervention.  Normal CBC, metabolic panel with hyperglycemia without acidosis.  Provide aspirin  and consult medicine for admission.  Minimal improvement of symptoms with Zofran , meclizine, IV fluids  Clinical Course as of 05/10/24 1518  Fri May 10, 2024  1055 Reassessed.  Patient reports feeling somewhat better but still is  persistent dizziness and leaning to the left.  I recommend MRI and he is agreeable. [DS]  1516 Consult with medicine for admission as well as updating the patient with MRI results [DS]    Clinical Course User Index [DS] Claudene Rover, MD     FINAL CLINICAL IMPRESSION(S) / ED DIAGNOSES   Final diagnoses:  Dizziness  Cerebrovascular accident (CVA), unspecified mechanism (HCC)     Rx / DC Orders   ED Discharge Orders     None        Note:  This document was prepared using Dragon voice recognition software and may include unintentional dictation errors.   Claudene Rover, MD 05/10/24 [phone]

## 2024-05-11 ENCOUNTER — Inpatient Hospital Stay: Payer: MEDICAID

## 2024-05-11 ENCOUNTER — Inpatient Hospital Stay (HOSPITAL_BASED_OUTPATIENT_CLINIC_OR_DEPARTMENT_OTHER)
Admit: 2024-05-11 | Discharge: 2024-05-11 | Disposition: A | Discharge status: Home / Self Care | Payer: MEDICAID | Attending: Hospitalist | Admitting: Hospitalist

## 2024-05-11 DIAGNOSIS — F3289 Other specified depressive episodes: Secondary | ICD-10-CM

## 2024-05-11 DIAGNOSIS — Z794 Long term (current) use of insulin: Secondary | ICD-10-CM

## 2024-05-11 DIAGNOSIS — I6389 Other cerebral infarction: Secondary | ICD-10-CM | POA: Diagnosis not present

## 2024-05-11 DIAGNOSIS — I639 Cerebral infarction, unspecified: Secondary | ICD-10-CM | POA: Diagnosis not present

## 2024-05-11 DIAGNOSIS — R338 Other retention of urine: Secondary | ICD-10-CM

## 2024-05-11 DIAGNOSIS — E1165 Type 2 diabetes mellitus with hyperglycemia: Secondary | ICD-10-CM

## 2024-05-11 DIAGNOSIS — I10 Essential (primary) hypertension: Secondary | ICD-10-CM

## 2024-05-11 DIAGNOSIS — N400 Enlarged prostate without lower urinary tract symptoms: Secondary | ICD-10-CM

## 2024-05-11 DIAGNOSIS — I739 Peripheral vascular disease, unspecified: Secondary | ICD-10-CM

## 2024-05-11 DIAGNOSIS — E785 Hyperlipidemia, unspecified: Secondary | ICD-10-CM

## 2024-05-11 DIAGNOSIS — N401 Enlarged prostate with lower urinary tract symptoms: Secondary | ICD-10-CM

## 2024-05-11 DIAGNOSIS — E663 Overweight: Secondary | ICD-10-CM

## 2024-05-11 LAB — LIPID PANEL
Cholesterol: 108 mg/dL (ref 0–200)
HDL: 28 mg/dL — ABNORMAL LOW (ref >40)
LDL Cholesterol: 48 mg/dL (ref 0–99)
Total CHOL/HDL Ratio: 3.9 ratio
Triglycerides: 162 mg/dL — ABNORMAL HIGH (ref <150)
VLDL: 32 mg/dL (ref 0–40)

## 2024-05-11 LAB — ECHOCARDIOGRAM COMPLETE
AV Peak grad: 9.5 mmHg
Ao pk vel: 1.54 m/s
Area-P 1/2: 3.24 cm2
Height: 71 in
S' Lateral: 3.4 cm
Weight: 3040 [oz_av]

## 2024-05-11 LAB — HIV ANTIBODY (ROUTINE TESTING W REFLEX): HIV Screen 4th Generation wRfx: NONREACTIVE

## 2024-05-11 LAB — GLUCOSE, CAPILLARY
Glucose-Capillary: 118 mg/dL — ABNORMAL HIGH (ref 70–99)
Glucose-Capillary: 193 mg/dL — ABNORMAL HIGH (ref 70–99)

## 2024-05-11 MED ORDER — LANTUS SOLOSTAR 100 UNIT/ML ~~LOC~~ SOPN: 33.0000 [IU] | PEN_INJECTOR | Freq: Every day | SUBCUTANEOUS | 0 refills | Status: AC

## 2024-05-11 MED ORDER — TAMSULOSIN HCL 0.4 MG PO CAPS: 0.4000 mg | ORAL_CAPSULE | Freq: Every day | ORAL | 0 refills | Status: AC

## 2024-05-11 MED ORDER — TAMSULOSIN HCL 0.4 MG PO CAPS
0.4000 mg | ORAL_CAPSULE | Freq: Every day | ORAL | Status: DC
  Administered 2024-05-11: 0.4 mg via ORAL
  Filled 2024-05-11: qty 1

## 2024-05-11 MED ORDER — IOHEXOL 350 MG/ML SOLN
75.0000 mL | Freq: Once | INTRAVENOUS | Status: AC | PRN
  Administered 2024-05-11: 75 mL via INTRAVENOUS

## 2024-05-11 NOTE — Progress Notes (Signed)
  Echocardiogram 2D Echocardiogram has been performed.  [name] 05/11/2024, 8:55 AM

## 2024-05-11 NOTE — Evaluation (Signed)
 Physical Therapy Evaluation Patient Details Name: [name] MRN: [id number] DOB: September 15, 1960 Today's Date: 05/11/2024  History of Present Illness  presented to ER secondary to acute onset of weakness, tingling to L UE/LE, dizziness; admitted for TIA/CVA work up.  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  Clinical Impression  Patient resting in bed upon arrival to room; alert and oriented, follows commands and agreeable to participation with session.  Generally talkative and verbose during interview, session, but easily redirectable.   Bilat UE/LE strength and ROM grossly symmetrical and WFL for basic transfers and mobility; no obvious, focal asymmetry.  Endorses baseline paresthesia in L 3rd digit of hand (previous injury), unchanged with this admission.  Patient does feel strength has improved since admission and is near his baseline. Currently requiring min assist for bed mobility; cga/min assist for basic transfers (bilat) and sit/stand efforts.  Does require UE support for lift off, stabilization and external support, but able to lift and clear buttocks, initiate and complete pivot between surfaces bilat with minimal support from therapist. Patient initially hesitant, anxious with transfer efforts, but confidence improving with repetition   Would benefit from skilled PT to address above deficits and promote optimal return to PLOF.; recommend post-acute PT follow up as indicated by interdisciplinary care team.        If plan is discharge home, recommend the following: A little help with walking and/or transfers;A little help with bathing/dressing/bathroom   Can travel by private vehicle        Equipment Recommendations  (has all equipment needed)  Recommendations for Other Services       Functional Status Assessment Patient has had a recent decline in their functional status and demonstrates the ability to make significant improvements in  function in a reasonable and predictable amount of time.     Precautions / Restrictions Precautions Precautions: Fall Restrictions Weight Bearing Restrictions Per Provider Order: No      Mobility  Bed Mobility Overal bed mobility: Needs Assistance Bed Mobility: Supine to Sit     Supine to sit: Contact guard, Min assist, HOB elevated, Used rails     General bed mobility comments: transition towards R side of bed    Transfers Overall transfer level: Needs assistance   Transfers: Bed to chair/wheelchair/BSC       Squat pivot transfers: Contact guard assist, Min assist     General transfer comment: does require UE support for lift off, stabilization and external support, but able to lift and clear buttocks, initiate and complete pivot between surfaces bilat with minimal support from therapist.  Patient initially hesitant, anxious with transfer efforts, but confidence improving with repetition    Ambulation/Gait               General Gait Details: non-ambulatory at baseline; manual WC as primary mobility  Stairs            Wheelchair Mobility     Tilt Bed    Modified Rankin (Stroke Patients Only)       Balance Overall balance assessment: Needs assistance Sitting-balance support: No upper extremity supported, Feet supported Sitting balance-Leahy Scale: Good     Standing balance support: Bilateral upper extremity supported Standing balance-Leahy Scale: Fair                               Pertinent Vitals/Pain Pain Assessment Pain Assessment: No/denies pain    Home  Living Family/patient expects to be discharged to:: Group home Living Arrangements: Non-relatives/Friends;Group Home Available Help at Discharge: Friend(s);Available PRN/intermittently Type of Home: Group Home Home Access: Ramped entrance       Home Layout: One level Home Equipment: Wheelchair - manual;Grab bars - toilet;Shower seat;Grab bars - tub/shower Additional  Comments: toilet rails    Prior Function Prior Level of Function : Needs assist             Mobility Comments: takes w/c [facility] [employer], indep with w/c transfers typically favoring transfers to his L side, rides the city bus occassionally ADLs Comments: mod indep with basic ADL, gets his groceries himself, group home provides bubble pack medications and insulins shots, sometimes has help for getting to dr appts     Extremity/Trunk Assessment   Upper Extremity Assessment Upper Extremity Assessment: Right hand dominant;LUE deficits/detail;RUE deficits/detail;Overall WFL for tasks assessed RUE Deficits / Details: 5th finger flexed 2/2 arthritis per pt RUE Sensation: WNL RUE Coordination: WNL LUE Deficits / Details: hx 3rd finger numbness after injury LUE Sensation: WNL    Lower Extremity Assessment Lower Extremity Assessment: RLE deficits/detail;LLE deficits/detail;Defer to PT evaluation RLE Deficits / Details: hx AKA; strength grossly 4/5 LLE Deficits / Details: grossly 4/5 throughout, no focal weakness; denies sensory deficit    Cervical / Trunk Assessment Cervical / Trunk Assessment: Normal  Communication   Communication Communication: No apparent difficulties    Cognition Arousal: Alert Behavior During Therapy: WFL for tasks assessed/performed   PT - Cognitive impairments: No apparent impairments                         Following commands: Intact       Cueing Cueing Techniques: Verbal cues, Gestural cues, Tactile cues     General Comments      Exercises Other Exercises Other Exercises: Bed/recliner transfer, bilat, cga/min assist for safety Other Exercises: Partial sit/stand from recliner, maintaining R UE support for balance, cga/min assist (to simulate position, activity required for toileting and clothing management)   Assessment/Plan    PT Assessment Patient needs continued PT services  PT Problem List Decreased strength;Decreased activity  tolerance;Decreased balance;Decreased mobility;Decreased coordination;Decreased knowledge of precautions;Decreased safety awareness;Decreased knowledge of use of DME       PT Treatment Interventions DME instruction;Functional mobility training;Therapeutic activities;Therapeutic exercise;Balance training;Neuromuscular re-education;Patient/family education;Wheelchair mobility training    PT Goals (Current goals can be found in the Care Plan section)  Acute Rehab PT Goals Patient Stated Goal: to get my strength back PT Goal Formulation: With patient Time For Goal Achievement: 05/25/24 Potential to Achieve Goals: Good    Frequency Min 2X/week     Co-evaluation PT/OT/SLP Co-Evaluation/Treatment: Yes Reason for Co-Treatment: Complexity of the patient's impairments (multi-system involvement);To address functional/ADL transfers;For patient/therapist safety PT goals addressed during session: Mobility/safety with mobility OT goals addressed during session: ADL's and self-care       AM-PAC PT 6 Clicks Mobility  Outcome Measure Help needed turning from your back to your side while in a flat bed without using bedrails?: None Help needed moving from lying on your back to sitting on the side of a flat bed without using bedrails?: A Little Help needed moving to and from a bed to a chair (including a wheelchair)?: A Little Help needed standing up from a chair using your arms (e.g., wheelchair or bedside chair)?: A Little Help needed to walk in hospital room?: Total Help needed climbing 3-5 steps with a railing? : Total  6 Click Score: 15    End of Session   Activity Tolerance: Patient tolerated treatment well Patient left: in chair;with call bell/phone within reach;with chair alarm set Nurse Communication: Mobility status PT Visit Diagnosis: Muscle weakness (generalized) (M62.81);Hemiplegia and hemiparesis Hemiplegia - Right/Left: Left Hemiplegia - dominant/non-dominant:  Non-dominant Hemiplegia - caused by: Cerebral infarction    Time: 9076-9054 PT Time Calculation (min) (ACUTE ONLY): 22 min   Charges:   PT Evaluation $PT Eval Moderate Complexity: 1 Mod   PT General Charges $$ ACUTE PT VISIT: 1 Visit       Paxton Kanaan H. Delores, PT, DPT, NCS 05/11/24, 10:39 AM [phone]

## 2024-05-11 NOTE — [place] FL2 (Signed)
 [place]  MEDICAID [id number] LEVEL OF CARE FORM     IDENTIFICATION  Patient Name: [name] Birthdate: 22-Dec-1960 Sex: male Admission Date (Current Location): 05/10/2024  [facility] and [place] Number:  Chiropodist and Address:         Provider Number: [id number]  Attending Physician Name and Address:  Josette Ade, MD  Relative Name and Phone Number:       Current Level of Care: Hospital Recommended Level of Care: Other (Comment) ([facility]) Prior Approval Number:    Date Approved/Denied:   PASRR Number:    Discharge Plan: Other (Comment) ([facility])    Current Diagnoses: Patient Active Problem List   Diagnosis Date Noted   BPH (benign prostatic hyperplasia) 05/11/2024   Overweight (BMI 25.0-29.9) 05/11/2024   Acute stroke due to ischemia (HCC) 05/10/2024   Constipation 03/22/2022   Osteomyelitis (HCC) 03/19/2022   Hyponatremia 03/19/2022   Cellulitis and abscess of right leg 03/18/2022   Depression 06/30/2021   Peripheral vascular disease 06/30/2021   Hospital discharge follow-up 12/24/2020   Uncontrolled type 2 diabetes mellitus with hyperglycemia, with long-term current use of insulin  (HCC)    Tobacco abuse counseling    Cellulitis/ulcer of right great toe due to diabetes mellitus (HCC) 10/08/2020   Pain of right great toe 10/08/2020   Ischemic foot 09/29/2020   Excessive cerumen in ear canal, bilateral 05/05/2020   History of anxiety 03/11/2020   Ischemia of lower extremity 01/16/2020   Needs assistance with community resources 09/11/2019   Neuropathic pain of ankle, left 01/25/2017   Adjustment disorder with mixed anxiety and depressed mood 08/27/2016   MDD (major depressive disorder) 08/10/2016   Hyperlipidemia 06/30/2016   Thrombocytopenia (HCC) 12/06/2012   Tobacco use disorder 12/05/2012   Dyslipidemia 12/05/2012   Essential hypertension 05/11/2011    Orientation RESPIRATION BLADDER Height & Weight     Self, Time,  Situation, Place  Normal Continent Weight: 86.2 kg Height:  5' 11 (180.3 cm)  BEHAVIORAL SYMPTOMS/MOOD NEUROLOGICAL BOWEL NUTRITION STATUS      Continent Diet  AMBULATORY STATUS COMMUNICATION OF NEEDS Skin   Limited Assist Verbally Normal                       Personal Care Assistance Level of Assistance  Bathing, Dressing Bathing Assistance: Limited assistance   Dressing Assistance: Limited assistance     Functional Limitations Info             SPECIAL CARE FACTORS FREQUENCY  PT (By licensed PT), OT (By licensed OT)     PT Frequency: 3 X/Week OT Frequency: 3 X/Week            Contractures Contractures Info: Not present    Additional Factors Info  Code Status Code Status Info: Full             Current Medications (05/11/2024):  This is the current hospital active medication list Current Facility-Administered Medications  Medication Dose Route Frequency Provider Last Rate Last Admin   0.9 %  sodium chloride  infusion   Intravenous Continuous Paudel, Keshab, MD 40 mL/hr at 05/10/24 1815 New Bag at 05/10/24 1815   acetaminophen  (TYLENOL ) tablet 650 mg  650 mg Oral Q4H PRN Paudel, Keshab, MD       Or   acetaminophen  (TYLENOL ) 160 MG/5ML solution 650 mg  650 mg Per Tube Q4H PRN Paudel, Nena, MD       Or   acetaminophen  (TYLENOL ) suppository 650  mg  650 mg Rectal Q4H PRN Paudel, Keshab, MD       aspirin  EC tablet 81 mg  81 mg Oral Daily Paudel, Keshab, MD   81 mg at 05/11/24 1010   atorvastatin  (LIPITOR) tablet 20 mg  20 mg Oral QHS Paudel, Nena, MD   20 mg at 05/10/24 2105   busPIRone  (BUSPAR ) tablet 10 mg  10 mg Oral BID Paudel, Nena, MD   10 mg at 05/11/24 1010   clopidogrel  (PLAVIX ) tablet 75 mg  75 mg Oral Daily Paudel, Keshab, MD   75 mg at 05/11/24 1013   enoxaparin  (LOVENOX ) injection 40 mg  40 mg Subcutaneous Q24H Paudel, Keshab, MD   40 mg at 05/10/24 2106   HYDROcodone -acetaminophen  (NORCO/VICODIN) 5-325 MG per tablet 1 tablet  1 tablet Oral  Q4H PRN Paudel, Keshab, MD   1 tablet at 05/11/24 0915   insulin  aspart (novoLOG ) injection 0-5 Units  0-5 Units Subcutaneous QHS Paudel, Nena, MD       insulin  aspart (novoLOG ) injection 0-9 Units  0-9 Units Subcutaneous TID WC Paudel, Keshab, MD   2 Units at 05/11/24 1242   insulin  glargine-yfgn (SEMGLEE ) injection 30 Units  30 Units Subcutaneous QHS Paudel, Keshab, MD   30 Units at 05/10/24 2107   metoprolol  tartrate (LOPRESSOR ) tablet 50 mg  50 mg Oral BID Paudel, Nena, MD   50 mg at 05/11/24 1011   senna-docusate (Senokot-S) tablet 1 tablet  1 tablet Oral QHS PRN Paudel, Keshab, MD       tamsulosin (FLOMAX) capsule 0.4 mg  0.4 mg Oral Daily Josette Ade, MD   0.4 mg at 05/11/24 1242     Discharge Medications: Please see discharge summary for a list of discharge medications.  Relevant Imaging Results:  Relevant Lab Results:   Additional Information    Victory Jackquline RAMAN, RN

## 2024-05-11 NOTE — Assessment & Plan Note (Signed)
 [place] stroke with pvd.  Neurology recommend aspirin  and plavix  and lipitor.  Neurology follow up as outpatient.  Patients symptoms have improved.  Set up home health.

## 2024-05-11 NOTE — Discharge Instructions (Signed)
 Hold metformen for two days then can go back on 05/13/24 in evening

## 2024-05-11 NOTE — Assessment & Plan Note (Addendum)
 On long acting insulin  30 units daily at home, will increase to 33 units nightly.  Continue jardiance.  Hold glucophage  for two days then can restart. Hemoglobin A1c 8.9.

## 2024-05-11 NOTE — Discharge Summary (Signed)
 Physician Discharge Summary   Patient: [name] MRN: [id number] DOB: 1961/07/02  Admit date:     05/10/2024  Discharge date: 05/11/24  Discharge Physician: Charlie Patterson   PCP: Osa Geralds, NP   Recommendations at discharge:    Follow up PCP 5 days Ambulatory referral to neurology  Discharge Diagnoses: Principal Problem:   Acute stroke due to ischemia [facility]) Active Problems:   Peripheral vascular disease   Essential hypertension   Uncontrolled type 2 diabetes mellitus with hyperglycemia, with long-term current use of insulin  (HCC)   Dyslipidemia   MDD (major depressive disorder)   BPH (benign prostatic hyperplasia)   Overweight (BMI 25.0-29.9)   Tobacco use disorder    Hospital Course: 63 y.o. male with medical history significant for PAD s/p right AKA, DM, history of DVT, HTN who came into ED at [facility] from group home for generalized weakness mostly left-sided, tingling, numbness, dizziness and pain around left neck started yesterday.  Patient is a group home resident, wheelchair-bound.  Normally he transfers from bed to wheelchair himself.  Yesterday he had a hard time getting transferred from bed to wheelchair, he felt dizzy, weak, had some tingling and numbness sensation on his left neck, left upper and lower extremities.  He never had history of stroke, denies any heart problems, denies chest pain, shortness of breath, palpitations.  He denies any leg swelling.  He denies any fever, chills, hematemesis, melena.   ED Course: Upon arrival to the ED, patient is found to be hypertensive at 147/81, afebrile with temperature of 97.5, saturating 95%, glucose of 327, creatinine 0.58, EKG shows sinus rhythm at 62 bpm, CT head and cervical spines were done which showed no acute pathology.  MRI of the cervical spine and brain was done which showed 1.7 mm acute infarct in the left lateral medulla.  Absent flow void in the distal left vertebral artery which may reflect occlusion.   Hospitalist service was consulted for evaluation for admission for acute stroke.  11/8.  Patient came in with falling to the left and unsteady.  Feeling better today.  Echo showed ef 60%.  CT angio head and neck showing:1. 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 The V3 and V4 segments are completely occluded. 2. Mild-to-moderate irregularity and stenosis of the V4 segment of the right vertebral artery.  NSR on telemetry and on ekg  Neurology recommended continuing aspirin  and plavix  and lipitor and neurology follow up as outpatient.  Assessment and Plan: * Acute stroke due to ischemia (HCC) Medulla stroke with pvd.  Neurology recommend aspirin  and plavix  and lipitor.  Neurology follow up as outpatient.  Patients symptoms have improved.  Set up home health.  Peripheral vascular disease On aspirin  and plavix   Essential hypertension On metoprolol   Uncontrolled type 2 diabetes mellitus with hyperglycemia, with long-term current use of insulin  (HCC) On long acting insulin  30 units daily at home, will increase to 33 units nightly.  Continue jardiance.  Hold glucophage  for two days then can restart. Hemoglobin A1c 8.9.  Dyslipidemia Continue lipitor, Ldl 42  MDD (major depressive disorder) On buspar   BPH (benign prostatic hyperplasia) Start flomax  Overweight (BMI 25.0-29.9) Bmi 26.5 with current height and weight in computer         Consultants: neurology, PT, OT speach Procedures performed: none  Disposition: Home health Diet recommendation:  Cardiac and Carb modified diet DISCHARGE MEDICATION:  Allergies as of 05/11/2024       Reactions   Erythromycin Base Anxiety   Erythromycin Base Anaphylaxis, Anxiety, Other (See Comments)   Azithromycin Anxiety   Benzodiazepines    PT STATES HE CANNOT TAKE THESE BECAUSE THEY ALMOST KILLED HIM Other reaction(s):  Other (See Comments)  Tachycardia  PT STATES HE CANNOT TAKE THESE BECAUSE THEY ALMOST KILLED HIM   Xanax [alprazolam] Other (See Comments)   Tachycardia   Hydrocodone  Other (See Comments)   nightmares   Methocarbamol Anxiety, Other (See Comments)   Stomach cramping, weakness   Methocarbamol Anxiety, Other (See Comments)   Stomach cramping, weakness   Sulfa Antibiotics Nausea And Vomiting, Other (See Comments)   Cramping in stomach and hyperventilate.         Medication List     STOP taking these medications    metFORMIN  1000 MG tablet Commonly known as: GLUCOPHAGE    mupirocin  ointment 2 % Commonly known as: BACTROBAN        TAKE these medications    acetaminophen  500 MG tablet Commonly known as: TYLENOL  Take 1,000 mg by mouth every 8 (eight) hours as needed for mild pain (pain score 1-3).   aspirin  EC 81 MG tablet Take 1 tablet (81 mg total) by mouth daily. Swallow whole.   atorvastatin  20 MG tablet Commonly known as: LIPITOR Take 20 mg by mouth daily.   busPIRone  10 MG tablet Commonly known as: BUSPAR  Take 10 mg by mouth 2 (two) times daily.   clopidogrel  75 MG tablet Commonly known as: Plavix  Take 1 tablet (75 mg total) by mouth daily.   cyclobenzaprine  10 MG tablet Commonly known as: FLEXERIL  Take 10 mg by mouth 3 (three) times daily as needed for muscle spasms.   gabapentin  300 MG capsule Commonly known as: NEURONTIN  TAKE 1 CAPSULE BY MOUTH 3 TIMES A DAY What changed:  how much to take when to take this   glucose blood test strip Commonly known as: True Metrix Blood Glucose Test Use as instructed   HYDROcodone -acetaminophen  5-325 MG tablet Commonly known as: NORCO/VICODIN Take 1 tablet by mouth every 4 (four) hours as needed for moderate pain (pain score 4-6).   Jardiance 25 MG Tabs tablet Generic drug: empagliflozin Take 25 mg by mouth daily.   Lantus  SoloStar 100 UNIT/ML Solostar Pen Generic drug: insulin  glargine Inject 33 Units into  the skin at bedtime. What changed: how much to take   metoprolol  tartrate 50 MG tablet Commonly known as: LOPRESSOR  TAKE 1 TABLET BY MOUTH 2 TIMES A DAY   tamsulosin 0.4 MG Caps capsule Commonly known as: FLOMAX Take 1 capsule (0.4 mg total) by mouth daily. Start taking on: May 12, 2024        Follow-up Information     Osa Geralds, NP Follow up in 5 day(s).   Specialty: Nurse Practitioner Contact information: [address] [place] [address] [phone]                Discharge Exam: Fredricka Weights   05/10/24 0836  Weight: 86.2 kg   Physical Exam HENT:     Head: Normocephalic.     Mouth/Throat:     Pharynx: No oropharyngeal exudate.  Eyes:     General: Lids are normal.     Conjunctiva/sclera: Conjunctivae normal.  Cardiovascular:     Rate and Rhythm: Normal rate and regular rhythm.     Heart sounds: Normal heart sounds, S1 normal and S2 normal.  Pulmonary:     Breath  sounds: No decreased breath sounds, wheezing, rhonchi or rales.  Abdominal:     Palpations: Abdomen is soft.     Tenderness: There is no abdominal tenderness.  Musculoskeletal:     Left lower leg: No swelling.     Right Lower Extremity: Right leg is amputated above knee.  Skin:    General: Skin is warm.     Findings: No rash.  Neurological:     Mental Status: He is alert and oriented to person, place, and time.     Comments: Power 5/5 upper extremities.  Able to leg raise bilaterally.      Condition at discharge: stable  The results of significant diagnostics from this hospitalization (including imaging, microbiology, ancillary and laboratory) are listed below for reference.   Imaging Studies: CT ANGIO HEAD NECK W WO CM Result Date: 05/11/2024 EXAM: CT HEAD WITHOUT CTA HEAD AND NECK WITH AND WITHOUT 05/11/2024 10:38:27 AM TECHNIQUE: CTA of the head and neck was performed with and without the administration of intravenous contrast. Noncontrast CT of the head with  reconstructed 2-D images are also provided for review. Multiplanar 2D and/or 3D reformatted images are provided for review. Automated exposure control, iterative reconstruction, and/or weight based adjustment of the mA/kV was utilized to reduce the radiation dose to as low as reasonably achievable. COMPARISON: CT of the head dated 05/10/2024 and MRI of the head dated 05/10/2024. CLINICAL HISTORY: Neuro deficit, acute, stroke suspected. FINDINGS: CT HEAD: BRAIN AND VENTRICLES: Age-related cerebral volume loss. No acute intracranial hemorrhage. No mass effect. No midline shift. No extra-axial fluid collection. No evidence of acute infarct. No hydrocephalus. ORBITS: No acute abnormality. SINUSES AND MASTOIDS: No acute abnormality. CTA NECK: AORTIC ARCH AND ARCH VESSELS: Mild calcific plaque within the aortic arch. No dissection or arterial injury. No significant stenosis of the brachiocephalic or subclavian arteries. CERVICAL CAROTID ARTERIES: No dissection, arterial injury, or hemodynamically significant stenosis by NASCET [employer] Symptomatic Carotid Endarterectomy Trial) criteria. CERVICAL VERTEBRAL ARTERIES: Left Vertebral Artery: Severe stenosis of the origin. Near occlusive stenosis of the V1 segment. Multisegment near occlusive stenosis of the V2 segment. The V3 segment appears to be completely occluded. The V4 segment is also completely occluded. Right Vertebral Artery: V4 segment: Irregular and demonstrates mild-to-moderate stenosis. LUNGS AND MEDIASTINUM: Unremarkable. SOFT TISSUES: No acute abnormality. BONES: No acute abnormality. CTA HEAD: ANTERIOR CIRCULATION: Internal Carotid Arteries: Moderate calcific plaque within the carotid siphons. There is too much motion artifact to accurately assess stenosis. Anterior Cerebral Arteries (ACA) and their proximal branches: Appear to be normal in caliber. No significant stenosis. Middle Cerebral Arteries (MCA) and their proximal branches: Appear to be normal  in caliber. No significant stenosis. No aneurysm. POSTERIOR CIRCULATION: Posterior Cerebral Arteries (PCA): Appear to be normal in caliber. No significant stenosis. Basilar Artery: No significant stenosis. Vertebral Arteries: Intracranial segments (V4): Left V4: Completely occluded. Right V4: Irregular and demonstrates mild-to-moderate stenosis. No aneurysm. OTHER: Posterior Communicating Arteries (PCOM): Present bilaterally. No dural venous sinus thrombosis on this non-dedicated study. The study is degraded by patient motion. IMPRESSION: 1. 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 The V3 and V4 segments are completely occluded. 2. Mild-to-moderate irregularity and stenosis of the V4 segment of the right vertebral artery. 3. Study degraded by patient motion, which limits accurate assessment of stenosis severity, particularly within the carotid siphons. Consider repeat CTA head and neck if clinically warranted. Electronically  signed by: Evalene Coho MD 05/11/2024 10:52 AM EST RP Workstation: [id number]   ECHOCARDIOGRAM COMPLETE Result Date: 05/11/2024    ECHOCARDIOGRAM REPORT   Patient Name:   BANJAMIN STOVALL Date of Exam: 05/11/2024 Medical Rec #:  [id number]     Height:       71.0 in Accession #:    [id number]    Weight:       190.0 lb Date of Birth:  10/18/1960     BSA:          2.063 m Patient Age:    63 years      BP:           156/94 mmHg Patient Gender: M             HR:           64 bpm. Exam Location:  [facility] Procedure: 2D Echo, Cardiac Doppler, Color Doppler and Strain Analysis (Both            Spectral and Color Flow Doppler were utilized during procedure). Indications:     Stroke I63.9  History:         Patient has no prior history of Echocardiogram examinations.  Sonographer:     Thedora Louder RDCS, FASE Referring Phys:  [id number] [facility] PAUDEL Diagnosing Phys: Caron Poser   Sonographer Comments: Suboptimal parasternal window. Image acquisition challenging due to patient body habitus. Global longitudinal strain was attempted. IMPRESSIONS  1. Technically difficult study.  2. Left ventricular ejection fraction, by estimation, is 60 to 65%. The left ventricle has normal function. Left ventricular endocardial border not optimally defined to evaluate regional wall motion. Left ventricular diastolic parameters were normal. The average left ventricular global longitudinal strain is -16.9 %. The global longitudinal strain is normal.  3. Right ventricular systolic function is normal. The right ventricular size is normal. Tricuspid regurgitation signal is inadequate for assessing PA pressure.  4. The aortic valve has an indeterminant number of cusps. There is mild calcification of the aortic valve. Aortic valve regurgitation is not visualized. No aortic stenosis is present.  5. The inferior vena cava is normal in size with greater than 50% respiratory variability, suggesting right atrial pressure of 3 mmHg. Comparison(s): No prior Echocardiogram. Conclusion(s)/Recommendation(s): Normal biventricular function without evidence of hemodynamically significant valvular heart disease. FINDINGS  Left Ventricle: Left ventricular ejection fraction, by estimation, is 60 to 65%. The left ventricle has normal function. Left ventricular endocardial border not optimally defined to evaluate regional wall motion. The average left ventricular global longitudinal strain is -16.9 %. Strain was performed and the global longitudinal strain is normal. The left ventricular internal cavity size was normal in size. There is no left ventricular hypertrophy. Left ventricular diastolic parameters were normal. Right Ventricle: The right ventricular size is normal. Right vetricular wall thickness was not well visualized. Right ventricular systolic function is normal. Tricuspid regurgitation signal is inadequate for assessing  PA pressure. Left Atrium: Left atrial size was normal in size. Right Atrium: Right atrial size was normal in size. Pericardium: There is no evidence of pericardial effusion. Mitral Valve: The mitral valve is degenerative in appearance. Mild mitral annular calcification. No evidence of mitral valve regurgitation. No evidence of mitral valve stenosis. Tricuspid Valve: The tricuspid valve is not well visualized. Tricuspid valve regurgitation is not demonstrated. No evidence of tricuspid stenosis. Aortic Valve: The aortic valve has an indeterminant number of cusps. There is mild calcification of the aortic valve. Aortic valve regurgitation is not visualized. No  aortic stenosis is present. Aortic valve peak gradient measures 9.5 mmHg. Pulmonic Valve: The pulmonic valve was not well visualized. Pulmonic valve regurgitation is not visualized. No evidence of pulmonic stenosis. Aorta: The aortic root is normal in size and structure and the ascending aorta was not well visualized. Venous: The inferior vena cava is normal in size with greater than 50% respiratory variability, suggesting right atrial pressure of 3 mmHg. IAS/Shunts: No atrial level shunt detected by color flow Doppler.  LEFT VENTRICLE PLAX 2D LVIDd:         5.00 cm Diastology LVIDs:         3.40 cm LV e' medial:    7.62 cm/s LV PW:         1.00 cm LV E/e' medial:  10.6 LV IVS:        1.00 cm LV e' lateral:   8.81 cm/s LV IVRT:       69 msec LV E/e' lateral: 9.2                         2D Longitudinal Strain                        2D Strain GLS Avg:     -16.9 % RIGHT VENTRICLE RV Basal diam:  3.10 cm RV S prime:     13.70 cm/s TAPSE (M-mode): 1.8 cm LEFT ATRIUM           Index        RIGHT ATRIUM           Index LA diam:      3.80 cm 1.84 cm/m   RA Area:     11.00 cm LA Vol (A4C): 32.2 ml 15.61 ml/m  RA Volume:   26.20 ml  12.70 ml/m  AORTIC VALVE              PULMONIC VALVE AV Vmax:      154.00 cm/s PV Vmax:        0.93 m/s AV Peak Grad: 9.5 mmHg    PV Peak  grad:   3.5 mmHg LVOT Vmax:    72.70 cm/s  RVOT Peak grad: 2 mmHg LVOT Vmean:   51.100 cm/s LVOT VTI:     0.196 m  AORTA Ao Root diam: 3.40 cm MITRAL VALVE MV Area (PHT): 3.24 cm    SHUNTS MV Decel Time: 234 msec    Systemic VTI: 0.20 m MV E velocity: 80.70 cm/s MV A velocity: 69.20 cm/s MV E/A ratio:  1.17 Caron Poser Electronically signed by Caron Poser Signature Date/Time: 05/11/2024/9:20:33 AM    Final    MR Cervical Spine Wo Contrast Result Date: 05/10/2024 EXAM: MRI CERVICAL SPINE WITHOUT CONTRAST 05/10/2024 02:25:32 PM TECHNIQUE: Multiplanar multisequence MRI of the cervical spine was performed. COMPARISON: CT cervical spine 05/10/2024. CLINICAL HISTORY: neck pain around C4-6, R>L hand weakness FINDINGS: BONES AND ALIGNMENT: Normal alignment. No fracture or suspicious marrow lesion. Mild degenerative endplate changes at C6-7, including very mild edema. SPINAL CORD: Normal spinal cord size. No abnormal spinal cord signal. SOFT TISSUES: No paraspinal mass. Absent left vertebral artery flow void. C2-C3: Mild facet arthrosis without stenosis. C3-C4: Disc bulging, uncovertebral spurring, and mild facet arthrosis result in mild right and mild to moderate left neural foraminal stenosis without spinal stenosis. C4-C5: Disc bulging, uncovertebral spurring, and moderate left facet arthrosis result in mild right and moderate to severe left neural foraminal stenosis without  spinal canal stenosis. C5-C6: Disc bulging, uncovertebral spurring, and mild facet arthrosis result in mild to moderate right and mild left neural foraminal stenosis without spinal stenosis. C6-C7: Mild disc space narrowing. Disc bulging and uncovertebral spurring result in mild spinal stenosis and moderate to severe right and moderate left neural foraminal stenosis. C7-T1: Negative. IMPRESSION: 1. Cervical spondylosis, greatest at C6-C7 where there is mild spinal stenosis and moderate to severe right and moderate left neural foraminal  stenosis. 2. Moderate to severe left neural foraminal stenosis at C4-5. 3. Absent left vertebral artery flow void likely reflecting occlusion. Electronically signed by: Dasie Hamburg MD 05/10/2024 02:54 PM EST RP Workstation: [id number]   MR BRAIN WO CONTRAST Result Date: 05/10/2024 EXAM: MRI BRAIN WITHOUT CONTRAST 05/10/2024 02:25:32 PM TECHNIQUE: Multiplanar multisequence MRI of the head/brain was performed without the administration of intravenous contrast. COMPARISON: Head CT 05/10/2024. CLINICAL HISTORY: Stroke. Vasculopath with 24hrs left leg weakness, dizziness, falling towards left. FINDINGS: BRAIN AND VENTRICLES: There is a 7 mm acute infarct in the left lateral medulla. No intracranial hemorrhage, mass, midline shift, hydrocephalus, or extra-axial fluid collection is identified. There is mild cerebral atrophy. Scattered punctate T2 hyperintensities in the cerebral white matter bilaterally are nonspecific but compatible with minimal chronic small vessel ischemic disease. Absent flow void in the distal left vertebral artery. ORBITS: No acute abnormality. SINUSES AND MASTOIDS: Widespread chronic sinusitis as noted on CT including extensive chronic osteitis involving the left maxillary sinus. A small to moderate-sized fluid level in the right maxillary sinus may reflect acute on chronic sinusitis. No significant mastoid fluid. BONES AND SOFT TISSUES: Normal marrow signal. No acute soft tissue abnormality. IMPRESSION: 1. 7 mm acute infarct in the left lateral medulla. 2. Absent flow void in the distal left vertebral artery which may reflect occlusion. Electronically signed by: Dasie Hamburg MD 05/10/2024 02:47 PM EST RP Workstation: [id number]   CT Cervical Spine Wo Contrast Result Date: 05/10/2024 EXAM: CT CERVICAL SPINE WITHOUT CONTRAST 05/10/2024 10:31:50 AM TECHNIQUE: CT of the cervical spine was performed without the administration of intravenous contrast. Multiplanar reformatted images are provided for  review. Automated exposure control, iterative reconstruction, and/or weight based adjustment of the mA/kV was utilized to reduce the radiation dose to as low as reasonably achievable. COMPARISON: CT head today, reported separately. Cervical spine radiographs 11/30/2018. CLINICAL HISTORY: 63 year old male. New dizziness, leaning to left, bilateral hand tingling. FINDINGS: CERVICAL SPINE: BONES AND ALIGNMENT: No acute fracture or traumatic malalignment. Mild straightening of lordosis since 11/30/2018. Mild dextroconvex cervical scoliosis is stable. DEGENERATIVE CHANGES: Multilevel chronic cervical spine disc and endplate degeneration, most pronounced at C6-C7. Up to mild associated cervical spinal stenosis by CT. SOFT TISSUES: No prevertebral soft tissue swelling. Negative visible non-contrast neck soft tissues. Negative visible non-contrast thoracic inlet. IMPRESSION: 1. No acute traumatic injury identified in the cervical spine. 2. Cervical spine degeneration maximal at C6-C7, with up to mild associated cervical canal stenosis. Electronically signed by: Helayne Hurst MD 05/10/2024 10:45 AM EST RP Workstation: [id number]   CT HEAD WO CONTRAST ( ) Result Date: 05/10/2024 EXAM: CT HEAD WITHOUT CONTRAST 05/10/2024 10:31:50 AM TECHNIQUE: CT of the head was performed without the administration of intravenous contrast. Automated exposure control, iterative reconstruction, and/or weight based adjustment of the mA/kV was utilized to reduce the radiation dose to as low as reasonably achievable. COMPARISON: Prior head CT 03/21/2005. CLINICAL HISTORY: 63 year old male with new dizziness, leaning to left, and bilateral hand tingling. FINDINGS: BRAIN AND VENTRICLES: No acute hemorrhage. No evidence  of acute infarct. No hydrocephalus. No extra-axial collection. No mass effect or midline shift. Generalized cerebral volume loss since 2006 no regional disproportionate atrophy identified. Calcified atherosclerosis at the skull  base. No suspicious intracranial vascular hyperdensity. Gray white differentiation is within normal limits for age. ORBITS: No acute abnormality. SINUSES: Widespread chronic paranasal sinusitis with mucoperiosteal thickening does not appear significantly changed since 2006. SOFT TISSUES AND SKULL: No acute soft tissue abnormality. No skull fracture. Bilateral middle ears and mastoids are well aerated. Leftward gaze. IMPRESSION: 1. No acute intracranial abnormality. Negative for age non contrast CT appearance of the brain. 2. Widespread chronic paranasal sinusitis with mucoperiosteal thickening not significantly changed since 2006. Electronically signed by: Helayne Hurst MD 05/10/2024 10:41 AM EST RP Workstation: [id number]    Microbiology: Results for orders placed or performed during the hospital encounter of 08/09/22  Resp panel by RT-PCR (RSV, Flu A&B, Covid) Anterior Nasal Swab     Status: None   Collection Time: 08/09/22  5:08 PM   Specimen: Anterior Nasal Swab  Result Value Ref Range Status   SARS Coronavirus 2 by RT PCR NEGATIVE NEGATIVE Final    Comment: (NOTE) SARS-CoV-2 target nucleic acids are NOT DETECTED.  The SARS-CoV-2 RNA is generally detectable in upper respiratory specimens during the acute phase of infection. The lowest concentration of SARS-CoV-2 viral copies this assay can detect is 138 copies/mL. A negative result does not preclude SARS-Cov-2 infection and should not be used as the sole basis for treatment or other patient management decisions. A negative result may occur with  improper specimen collection/handling, submission of specimen other than nasopharyngeal swab, presence of viral mutation(s) within the areas targeted by this assay, and inadequate number of viral copies(<138 copies/mL). A negative result must be combined with clinical observations, patient history, and epidemiological information. The expected result is Negative.  Fact Sheet for Patients:   bloggercourse.com  Fact Sheet for Healthcare Providers:  seriousbroker.it  This test is no t yet approved or cleared by the [place]  FDA and  has been authorized for detection and/or diagnosis of SARS-CoV-2 by FDA under an Emergency Use Authorization (EUA). This EUA will remain  in effect (meaning this test can be used) for the duration of the COVID-19 declaration under Section 564(b)(1) of the Act, 21 U.S.C.section 360bbb-3(b)(1), unless the authorization is terminated  or revoked sooner.       Influenza A by PCR NEGATIVE NEGATIVE Final   Influenza B by PCR NEGATIVE NEGATIVE Final    Comment: (NOTE) The Xpert Xpress SARS-CoV-2/FLU/RSV plus assay is intended as an aid in the diagnosis of influenza from Nasopharyngeal swab specimens and should not be used as a sole basis for treatment. Nasal washings and aspirates are unacceptable for Xpert Xpress SARS-CoV-2/FLU/RSV testing.  Fact Sheet for Patients: bloggercourse.com  Fact Sheet for Healthcare Providers: seriousbroker.it  This test is not yet approved or cleared by the [place]  FDA and has been authorized for detection and/or diagnosis of SARS-CoV-2 by FDA under an Emergency Use Authorization (EUA). This EUA will remain in effect (meaning this test can be used) for the duration of the COVID-19 declaration under Section 564(b)(1) of the Act, 21 U.S.C. section 360bbb-3(b)(1), unless the authorization is terminated or revoked.     Resp Syncytial Virus by PCR NEGATIVE NEGATIVE Final    Comment: (NOTE) Fact Sheet for Patients: bloggercourse.com  Fact Sheet for Healthcare Providers: seriousbroker.it  This test is not yet approved or cleared by the [place]  FDA and has been authorized  for detection and/or diagnosis of SARS-CoV-2 by FDA under an Emergency Use  Authorization (EUA). This EUA will remain in effect (meaning this test can be used) for the duration of the COVID-19 declaration under Section 564(b)(1) of the Act, 21 U.S.C. section 360bbb-3(b)(1), unless the authorization is terminated or revoked.  Performed at [facility], [address] [place], [place] [address]     Labs: CBC: Recent Labs  Lab 05/10/24 0928 05/10/24 1731  WBC 7.4 7.7  HGB 16.2 16.4  HCT 48.0 48.2  MCV 93.9 93.4  PLT 153 156   Basic Metabolic Panel: Recent Labs  Lab 05/10/24 0928 05/10/24 1731  NA 138  --   K 4.0  --   CL 104  --   CO2 21*  --   GLUCOSE 327*  --   BUN 14  --   CREATININE 0.58* 0.67  CALCIUM  8.9  --    Liver Function Tests: Recent Labs  Lab 05/10/24 0928  AST 12*  ALT 15  ALKPHOS 63  BILITOT 0.6  PROT 6.9  ALBUMIN 3.8   CBG: Recent Labs  Lab 05/10/24 2103 05/11/24 0808 05/11/24 1150  GLUCAP 196* 118* 193*    Discharge time spent: greater than 30 minutes.  Signed: Charlie Patterson, MD [facility] 05/11/2024

## 2024-05-11 NOTE — Plan of Care (Signed)
 Problem: Education: Goal: Knowledge of disease or condition will improve 05/11/2024 0527 by Orlinda Pool, RN Outcome: Progressing 05/11/2024 0457 by Orlinda Pool, RN Outcome: Progressing Goal: Knowledge of secondary prevention will improve (MUST DOCUMENT ALL) 05/11/2024 0527 by Orlinda Pool, RN Outcome: Progressing 05/11/2024 0457 by Orlinda Pool, RN Outcome: Progressing Goal: Knowledge of patient specific risk factors will improve (DELETE if not current risk factor) 05/11/2024 0527 by Orlinda Pool, RN Outcome: Progressing 05/11/2024 0457 by Orlinda Pool, RN Outcome: Progressing   Problem: Ischemic Stroke/TIA Tissue Perfusion: Goal: Complications of ischemic stroke/TIA will be minimized 05/11/2024 0527 by Orlinda Pool, RN Outcome: Progressing 05/11/2024 0457 by Orlinda Pool, RN Outcome: Progressing   Problem: Coping: Goal: Will verbalize positive feelings about self 05/11/2024 0527 by Orlinda Pool, RN Outcome: Progressing 05/11/2024 0457 by Orlinda Pool, RN Outcome: Progressing Goal: Will identify appropriate support needs 05/11/2024 0527 by Orlinda Pool, RN Outcome: Progressing 05/11/2024 0457 by Orlinda Pool, RN Outcome: Progressing   Problem: Health Behavior/Discharge Planning: Goal: Ability to manage health-related needs will improve 05/11/2024 0527 by Orlinda Pool, RN Outcome: Progressing 05/11/2024 0457 by Orlinda Pool, RN Outcome: Progressing Goal: Goals will be collaboratively established with patient/family 05/11/2024 0527 by Orlinda Pool, RN Outcome: Progressing 05/11/2024 0457 by Orlinda Pool, RN Outcome: Progressing   Problem: Self-Care: Goal: Ability to participate in self-care as condition permits will improve 05/11/2024 0527 by Orlinda Pool, RN Outcome: Progressing 05/11/2024 0457 by Orlinda Pool,  RN Outcome: Progressing Goal: Verbalization of feelings and concerns over difficulty with self-care will improve 05/11/2024 0527 by Orlinda Pool, RN Outcome: Progressing 05/11/2024 0457 by Orlinda Pool, RN Outcome: Progressing Goal: Ability to communicate needs accurately will improve 05/11/2024 0527 by Orlinda Pool, RN Outcome: Progressing 05/11/2024 0457 by Orlinda Pool, RN Outcome: Progressing   Problem: Nutrition: Goal: Risk of aspiration will decrease 05/11/2024 0527 by Orlinda Pool, RN Outcome: Progressing 05/11/2024 0457 by Orlinda Pool, RN Outcome: Progressing Goal: Dietary intake will improve 05/11/2024 0527 by Orlinda Pool, RN Outcome: Progressing 05/11/2024 0457 by Orlinda Pool, RN Outcome: Progressing   Problem: Education: Goal: Knowledge of General Education information will improve Description: Including pain rating scale, medication(s)/side effects and non-pharmacologic comfort measures 05/11/2024 0527 by Orlinda Pool, RN Outcome: Progressing 05/11/2024 0457 by Orlinda Pool, RN Outcome: Progressing   Problem: Health Behavior/Discharge Planning: Goal: Ability to manage health-related needs will improve 05/11/2024 0527 by Orlinda Pool, RN Outcome: Progressing 05/11/2024 0457 by Orlinda Pool, RN Outcome: Progressing   Problem: Clinical Measurements: Goal: Ability to maintain clinical measurements within normal limits will improve 05/11/2024 0527 by Orlinda Pool, RN Outcome: Progressing 05/11/2024 0457 by Orlinda Pool, RN Outcome: Progressing Goal: Will remain free from infection 05/11/2024 0527 by Orlinda Pool, RN Outcome: Progressing 05/11/2024 0457 by Orlinda Pool, RN Outcome: Progressing Goal: Diagnostic test results will improve 05/11/2024 0527 by Orlinda Pool, RN Outcome: Progressing 05/11/2024 0457 by  Orlinda Pool, RN Outcome: Progressing Goal: Respiratory complications will improve 05/11/2024 0527 by Orlinda Pool, RN Outcome: Progressing 05/11/2024 0457 by Orlinda Pool, RN Outcome: Progressing Goal: Cardiovascular complication will be avoided 05/11/2024 0527 by Orlinda Pool, RN Outcome: Progressing 05/11/2024 0457 by Orlinda Pool, RN Outcome: Progressing   Problem: Activity: Goal: Risk for activity intolerance will decrease 05/11/2024 0527 by Orlinda Pool, RN Outcome: Progressing 05/11/2024 0457 by Orlinda Pool, RN Outcome: Progressing   Problem: Nutrition: Goal: Adequate nutrition will be maintained 05/11/2024 0527 by Orlinda Pool, RN Outcome: Progressing 05/11/2024 0457 by Orlinda Pool, RN Outcome: Progressing   Problem: Coping: Goal: Level of anxiety will decrease  05/11/2024 0527 by Orlinda Pool, RN Outcome: Progressing 05/11/2024 0457 by Orlinda Pool, RN Outcome: Progressing   Problem: Elimination: Goal: Will not experience complications related to bowel motility 05/11/2024 0527 by Orlinda Pool, RN Outcome: Progressing 05/11/2024 0457 by Orlinda Pool, RN Outcome: Progressing Goal: Will not experience complications related to urinary retention 05/11/2024 0527 by Orlinda Pool, RN Outcome: Progressing 05/11/2024 0457 by Orlinda Pool, RN Outcome: Progressing   Problem: Pain Managment: Goal: General experience of comfort will improve and/or be controlled 05/11/2024 0527 by Orlinda Pool, RN Outcome: Progressing 05/11/2024 0457 by Orlinda Pool, RN Outcome: Progressing   Problem: Safety: Goal: Ability to remain free from injury will improve 05/11/2024 0527 by Orlinda Pool, RN Outcome: Progressing 05/11/2024 0457 by Orlinda Pool, RN Outcome: Progressing   Problem: Skin Integrity: Goal: Risk for impaired  skin integrity will decrease 05/11/2024 0527 by Orlinda Pool, RN Outcome: Progressing 05/11/2024 0457 by Orlinda Pool, RN Outcome: Progressing   Problem: Education: Goal: Ability to describe self-care measures that may prevent or decrease complications (Diabetes Survival Skills Education) will improve 05/11/2024 0527 by Orlinda Pool, RN Outcome: Progressing 05/11/2024 0457 by Orlinda Pool, RN Outcome: Progressing Goal: Individualized Educational Video(s) 05/11/2024 0527 by Orlinda Pool, RN Outcome: Progressing 05/11/2024 0457 by Orlinda Pool, RN Outcome: Progressing   Problem: Coping: Goal: Ability to adjust to condition or change in health will improve 05/11/2024 0527 by Orlinda Pool, RN Outcome: Progressing 05/11/2024 0457 by Orlinda Pool, RN Outcome: Progressing   Problem: Fluid Volume: Goal: Ability to maintain a balanced intake and output will improve 05/11/2024 0527 by Orlinda Pool, RN Outcome: Progressing 05/11/2024 0457 by Orlinda Pool, RN Outcome: Progressing   Problem: Health Behavior/Discharge Planning: Goal: Ability to identify and utilize available resources and services will improve 05/11/2024 0527 by Orlinda Pool, RN Outcome: Progressing 05/11/2024 0457 by Orlinda Pool, RN Outcome: Progressing Goal: Ability to manage health-related needs will improve 05/11/2024 0527 by Orlinda Pool, RN Outcome: Progressing 05/11/2024 0457 by Orlinda Pool, RN Outcome: Progressing   Problem: Metabolic: Goal: Ability to maintain appropriate glucose levels will improve 05/11/2024 0527 by Orlinda Pool, RN Outcome: Progressing 05/11/2024 0457 by Orlinda Pool, RN Outcome: Progressing   Problem: Nutritional: Goal: Maintenance of adequate nutrition will improve 05/11/2024 0527 by Orlinda Pool, RN Outcome: Progressing 05/11/2024 0457 by  Orlinda Pool, RN Outcome: Progressing Goal: Progress toward achieving an optimal weight will improve 05/11/2024 0527 by Orlinda Pool, RN Outcome: Progressing 05/11/2024 0457 by Orlinda Pool, RN Outcome: Progressing   Problem: Skin Integrity: Goal: Risk for impaired skin integrity will decrease 05/11/2024 0527 by Orlinda Pool, RN Outcome: Progressing 05/11/2024 0457 by Orlinda Pool, RN Outcome: Progressing   Problem: Tissue Perfusion: Goal: Adequacy of tissue perfusion will improve 05/11/2024 0527 by Orlinda Pool, RN Outcome: Progressing 05/11/2024 0457 by Orlinda Pool, RN Outcome: Progressing

## 2024-05-11 NOTE — Plan of Care (Signed)
 Neurology plan of care   [name] is a pleasant 64 y.o. male with medical history significant for PAD s/p right AKA, DM, history of DVT, HTN who came into ED at [facility] from group home for L sided weakness and numbness. 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  Stroke workup this admission:  CTA H&N 1. 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 The V3 and V4 segments are completely occluded. 2. Mild-to-moderate irregularity and stenosis of the V4 segment of the right vertebral artery. 3. Study degraded by patient motion, which limits accurate assessment of stenosis severity, particularly within the carotid siphons. Consider repeat CTA head and neck if clinically warranted.  TTE 1. Technically difficult study. 2. Left ventricular ejection fraction, by estimation, is 60 to 65% . The left ventricle has normal function. Left ventricular endocardial border not optimally defined to evaluate regional wall motion. Left ventricular diastolic parameters were normal. The average left ventricular global longitudinal strain is - 16. 9 % . The global longitudinal strain is normal. 3. Right ventricular systolic function is normal. The right ventricular size is normal. Tricuspid regurgitation signal is inadequate for assessing PA pressure. 4. The aortic valve has an indeterminant number of cusps. There is mild calcification of the aortic valve. Aortic valve regurgitation is not visualized. No aortic stenosis is present. 5. The inferior vena cava is normal in size with greater than 50% respiratory variability, suggesting right atrial pressure of 3 mmHg.  Stroke Labs     Component Value Date/Time   CHOL 108 05/11/2024 0556   CHOL 151 04/22/2020 1347   TRIG 162 (H) 05/11/2024 0556   HDL 28 (L) 05/11/2024 0556   HDL 32 (L) 04/22/2020 1347   CHOLHDL 3.9 05/11/2024 0556   VLDL  32 05/11/2024 0556   LDLCALC 48 05/11/2024 0556   LDLCALC 93 04/22/2020 1347   LABVLDL 26 04/22/2020 1347    Lab Results  Component Value Date/Time   HGBA1C 8.9 (H) 05/10/2024 05:31 PM   HGBA1C 13.5 08/04/2020 10:12 PM   HGBA1C 0 (A) 08/04/2020 10:12 PM   HGBA1C 0.0 08/04/2020 10:12 PM   Etiology of stroke is small vessel disease. Stroke workup is now completed.  Final recommendations: - OK to discharge patient on home DAPT and atorvastatin  20mg  daily. Duration of DAPT should be continued for at least 90 days after which he can be transitioned to plavix  75mg  daily unless there is another indication for continued DAPT - I will arrange outpatient neuro f/u  Elida Ross, MD [facility] [phone]  If 7pm- 7am, please page neurology on call as listed in AMION.

## 2024-05-11 NOTE — Assessment & Plan Note (Signed)
 On metoprolol

## 2024-05-11 NOTE — Progress Notes (Signed)
 SLP Cancellation Note  Patient Details Name: [name] MRN: [id number] DOB: 27-Mar-1961   Cancelled treatment:       Reason Eval/Treat Not Completed: SLP screened, no needs identified, will sign off (chart reviewed; consulted NSG and met w/ pt.)   Pt is a 63 y.o. male with medical history significant for PAD s/p right AKA, DM, history of DVT, HTN who came into ED at [facility] from group home for generalized weakness mostly left-sided, tingling, numbness, dizziness and pain around left neck started yesterday. Patient is a group home resident, wheelchair-bound.   Pt denied any difficulty swallowing and is currently on a regular diet; tolerates swallowing pills w/ water per NSG. Pt conversed in full conversation w/out expressive/receptive deficits noted; pt denied any speech-language deficits. Speech clear. He was drinking Diet [place] and requested another during this visit. No further skilled ST services indicated as pt appears at his baseline. Pt agreed. NSG to reconsult if any change in status while admitted.     Comer Portugal, MS, CCC-SLP Speech Language Pathologist Rehab Services; [facility] [phone] (ascom) Lucky Alverson 05/11/2024, 2:32 PM

## 2024-05-11 NOTE — Assessment & Plan Note (Signed)
On aspirin and plavix.  

## 2024-05-11 NOTE — Plan of Care (Signed)

## 2024-05-11 NOTE — Assessment & Plan Note (Signed)
Start flomax

## 2024-05-11 NOTE — Hospital Course (Addendum)
 64 y.o. male with medical history significant for PAD s/p right AKA, DM, history of DVT, HTN who came into ED at [facility] from group home for generalized weakness mostly left-sided, tingling, numbness, dizziness and pain around left neck started yesterday.  Patient is a group home resident, wheelchair-bound.  Normally he transfers from bed to wheelchair himself.  Yesterday he had a hard time getting transferred from bed to wheelchair, he felt dizzy, weak, had some tingling and numbness sensation on his left neck, left upper and lower extremities.  He never had history of stroke, denies any heart problems, denies chest pain, shortness of breath, palpitations.  He denies any leg swelling.  He denies any fever, chills, hematemesis, melena.   ED Course: Upon arrival to the ED, patient is found to be hypertensive at 147/81, afebrile with temperature of 97.5, saturating 95%, glucose of 327, creatinine 0.58, EKG shows sinus rhythm at 62 bpm, CT head and cervical spines were done which showed no acute pathology.  MRI of the cervical spine and brain was done which showed 1.7 mm acute infarct in the left lateral medulla.  Absent flow void in the distal left vertebral artery which may reflect occlusion.  Hospitalist service was consulted for evaluation for admission for acute stroke.  11/8.  Patient came in with falling to the left and unsteady.  Feeling better today.  Echo showed ef 60%.  CT angio head and neck showing:1. 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 The V3 and V4 segments are completely occluded. 2. Mild-to-moderate irregularity and stenosis of the V4 segment of the right vertebral artery.  NSR on telemetry and on ekg  Neurology recommended continuing aspirin  and plavix  and lipitor and neurology follow up as outpatient.

## 2024-05-11 NOTE — Assessment & Plan Note (Signed)
 Bmi 26.5 with current height and weight in computer

## 2024-05-11 NOTE — Assessment & Plan Note (Signed)
 Continue lipitor, Ldl 42

## 2024-05-11 NOTE — Plan of Care (Signed)
  Problem: Education: Goal: Knowledge of disease or condition will improve Outcome: Progressing   Problem: Coping: Goal: Will identify appropriate support needs Outcome: Progressing   Problem: Health Behavior/Discharge Planning: Goal: Goals will be collaboratively established with patient/family Outcome: Progressing   Problem: Self-Care: Goal: Ability to participate in self-care as condition permits will improve Outcome: Progressing   Problem: Education: Goal: Knowledge of General Education information will improve Description: Including pain rating scale, medication(s)/side effects and non-pharmacologic comfort measures Outcome: Progressing   Problem: Clinical Measurements: Goal: Cardiovascular complication will be avoided Outcome: Progressing   Problem: Nutrition: Goal: Adequate nutrition will be maintained Outcome: Progressing   Problem: Pain Managment: Goal: General experience of comfort will improve and/or be controlled Outcome: Progressing

## 2024-05-11 NOTE — Assessment & Plan Note (Signed)
On buspar

## 2024-05-11 NOTE — Plan of Care (Signed)
 Problem: Education: Goal: Knowledge of disease or condition will improve Outcome: Adequate for Discharge Goal: Knowledge of secondary prevention will improve (MUST DOCUMENT ALL) Outcome: Adequate for Discharge Goal: Knowledge of patient specific risk factors will improve (DELETE if not current risk factor) Outcome: Adequate for Discharge   Problem: Ischemic Stroke/TIA Tissue Perfusion: Goal: Complications of ischemic stroke/TIA will be minimized Outcome: Adequate for Discharge   Problem: Coping: Goal: Will verbalize positive feelings about self Outcome: Adequate for Discharge Goal: Will identify appropriate support needs Outcome: Adequate for Discharge   Problem: Health Behavior/Discharge Planning: Goal: Ability to manage health-related needs will improve Outcome: Adequate for Discharge Goal: Goals will be collaboratively established with patient/family Outcome: Adequate for Discharge   Problem: Self-Care: Goal: Ability to participate in self-care as condition permits will improve Outcome: Adequate for Discharge Goal: Verbalization of feelings and concerns over difficulty with self-care will improve Outcome: Adequate for Discharge Goal: Ability to communicate needs accurately will improve Outcome: Adequate for Discharge   Problem: Nutrition: Goal: Risk of aspiration will decrease Outcome: Adequate for Discharge Goal: Dietary intake will improve Outcome: Adequate for Discharge   Problem: Education: Goal: Knowledge of General Education information will improve Description: Including pain rating scale, medication(s)/side effects and non-pharmacologic comfort measures Outcome: Adequate for Discharge   Problem: Health Behavior/Discharge Planning: Goal: Ability to manage health-related needs will improve Outcome: Adequate for Discharge   Problem: Clinical Measurements: Goal: Ability to maintain clinical measurements within normal limits will improve Outcome: Adequate for  Discharge Goal: Will remain free from infection Outcome: Adequate for Discharge Goal: Diagnostic test results will improve Outcome: Adequate for Discharge Goal: Respiratory complications will improve Outcome: Adequate for Discharge Goal: Cardiovascular complication will be avoided Outcome: Adequate for Discharge   Problem: Activity: Goal: Risk for activity intolerance will decrease Outcome: Adequate for Discharge   Problem: Nutrition: Goal: Adequate nutrition will be maintained Outcome: Adequate for Discharge   Problem: Coping: Goal: Level of anxiety will decrease Outcome: Adequate for Discharge   Problem: Elimination: Goal: Will not experience complications related to bowel motility Outcome: Adequate for Discharge Goal: Will not experience complications related to urinary retention Outcome: Adequate for Discharge   Problem: Pain Managment: Goal: General experience of comfort will improve and/or be controlled Outcome: Adequate for Discharge   Problem: Safety: Goal: Ability to remain free from injury will improve Outcome: Adequate for Discharge   Problem: Skin Integrity: Goal: Risk for impaired skin integrity will decrease Outcome: Adequate for Discharge   Problem: Education: Goal: Ability to describe self-care measures that may prevent or decrease complications (Diabetes Survival Skills Education) will improve Outcome: Adequate for Discharge Goal: Individualized Educational Video(s) Outcome: Adequate for Discharge   Problem: Coping: Goal: Ability to adjust to condition or change in health will improve Outcome: Adequate for Discharge   Problem: Fluid Volume: Goal: Ability to maintain a balanced intake and output will improve Outcome: Adequate for Discharge   Problem: Health Behavior/Discharge Planning: Goal: Ability to identify and utilize available resources and services will improve Outcome: Adequate for Discharge Goal: Ability to manage health-related needs  will improve Outcome: Adequate for Discharge   Problem: Metabolic: Goal: Ability to maintain appropriate glucose levels will improve Outcome: Adequate for Discharge   Problem: Nutritional: Goal: Maintenance of adequate nutrition will improve Outcome: Adequate for Discharge Goal: Progress toward achieving an optimal weight will improve Outcome: Adequate for Discharge   Problem: Skin Integrity: Goal: Risk for impaired skin integrity will decrease Outcome: Adequate for Discharge   Problem: Tissue Perfusion: Goal: Adequacy  of tissue perfusion will improve Outcome: Adequate for Discharge

## 2024-05-11 NOTE — TOC Transition Note (Signed)
 Transition of Care [facility]) - Discharge Note   Patient Details  Name: [name] MRN: [id number] Date of Birth: December 14, 1960  [facility] CM/SW Contact:  Victory Jackquline RAMAN, RN Phone Number: 05/11/2024, 2:06 PM   Clinical Narrative:   RNCM received a message via secure chat from the MD stating that he was discharging the patient today and he ordered HH/PT/OT. RNCM called patient @ [phone]. I RNCM introduced role and explained that discharge planning would be discussed. Informed him that the MD was ordering HH/PT/OT. He states that he is in agreement with it and for me to submit referrals for him because he doesn't know of any. Referrals submitted. Informed patient that I would call him back to review [facility] Agency offers. Rosana the owner of the [facility] is coming to pick him up. Patient has discharge orders. no further concerns. RNCM signing off   Final next level of care: Home w Home Health Services Barriers to Discharge: Barriers Resolved   Patient Goals and CMS Choice            Discharge Placement                Patient to be transferred to facility by: Rosana [facility] of [facility] Name of family member notified: Rosana Patient and family notified of of transfer: 05/11/24  Discharge Plan and Services Additional resources added to the After Visit Summary for                            [facility] Arranged: PT, OT HH Agency:  (Referrals Sent Via EPIC) Date HH Agency Contacted: 05/11/24      Social Drivers of Health (SDOH) Interventions SDOH Screenings   Food Insecurity: No Food Insecurity (05/11/2024)  Housing: Low Risk  (05/11/2024)  Transportation Needs: Unmet Transportation Needs (05/11/2024)  Utilities: Not At Risk (05/11/2024)  Alcohol Screen: Low Risk  (01/20/2021)  Depression (PHQ2-9): Medium Risk (06/02/2020)  Financial Resource Strain: High Risk (09/06/2021)   Received from [facility]  Physical Activity: Insufficiently Active  (09/19/2019)  Social Connections: Socially Isolated (09/19/2019)  Stress: Stress Concern Present (09/19/2019)  Tobacco Use: High Risk (05/10/2024)  Health Literacy: Medium Risk (10/18/2021)   [facility] from [facility]     Readmission Risk Interventions     No data to display

## 2024-05-11 NOTE — Evaluation (Signed)
 Occupational Therapy Evaluation Patient Details Name: [name] MRN: [id number] DOB: 1961/04/03 Today's Date: 05/11/2024   History of Present Illness   presented to ER secondary to acute onset of weakness, tingling to L UE/LE, dizziness; admitted for TIA/CVA work up.  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     Clinical Impressions Pt was seen for OT evaluation this date. Prior to hospital admission, pt was living in a group home, mod indep with ADL, transfers in/out of his w/c. Group home helps manage medications and pt will occasionally use public transportation.  Pt presents with deficits in strength and balance, affecting safe and optimal ADL completion. Pt currently requires CGA-MIN A for bed mobility and squat pivot transfers to the recliner. Able to don L sock indep from bed level. PRN MIN A for LB dressing otherwise using lateral lean.  Pt would benefit from skilled OT services to address noted impairments and functional limitations (see below for any additional details) in order to maximize safety and independence while minimizing future risk of falls, injury, and readmission. Anticipate the need for follow up OT services upon acute hospital DC.    If plan is discharge home, recommend the following:   A little help with walking and/or transfers;A little help with bathing/dressing/bathroom;Assist for transportation;Assistance with cooking/housework;Help with stairs or ramp for entrance     Functional Status Assessment   Patient has had a recent decline in their functional status and demonstrates the ability to make significant improvements in function in a reasonable and predictable amount of time.     Equipment Recommendations   None recommended by OT     Recommendations for Other Services         Precautions/Restrictions   Precautions Precautions: Fall Recall of Precautions/Restrictions: Intact Restrictions Weight  Bearing Restrictions Per Provider Order: No Other Position/Activity Restrictions: hx R AKA     Mobility Bed Mobility Overal bed mobility: Needs Assistance Bed Mobility: Supine to Sit     Supine to sit: Contact guard, Min assist, HOB elevated, Used rails     General bed mobility comments: transition towards R side of bed    Transfers Overall transfer level: Needs assistance   Transfers: Bed to chair/wheelchair/BSC     Squat pivot transfers: Contact guard assist, Min assist       General transfer comment: does require UE support for lift off, stabilization and external support, but able to lift and clear buttocks, initiate and complete pivot between surfaces bilat with minimal support from therapist.  Patient initially hesitant, anxious with transfer efforts, but confidence improving with repetition      Balance Overall balance assessment: Needs assistance Sitting-balance support: No upper extremity supported, Feet supported Sitting balance-Leahy Scale: Good     Standing balance support: Bilateral upper extremity supported Standing balance-Leahy Scale: Fair                             ADL either performed or assessed with clinical judgement   ADL Overall ADL's : Needs assistance/impaired     Grooming: Sitting;Set up               Lower Body Dressing: Bed level Lower Body Dressing Details (indicate cue type and reason): no assist for donning sock to L foot but increased effort, MIN A for completing LB dressing over hips in partial stand  Vision         Perception         Praxis         Pertinent Vitals/Pain Pain Assessment Pain Assessment: No/denies pain     Extremity/Trunk Assessment Upper Extremity Assessment Upper Extremity Assessment: Right hand dominant;Overall WFL for tasks assessed;RUE deficits/detail;LUE deficits/detail RUE Deficits / Details: 5th finger flexed 2/2 arthritis per pt RUE Sensation:  WNL RUE Coordination: WNL LUE Deficits / Details: hx 3rd finger numbness after injury LUE Sensation: WNL   Lower Extremity Assessment Lower Extremity Assessment: RLE deficits/detail;LLE deficits/detail RLE Deficits / Details: hx AKA; strength grossly 4/5 LLE Deficits / Details: grossly 4/5 throughout, no focal weakness; denies sensory deficit LLE Sensation: WNL   Cervical / Trunk Assessment Cervical / Trunk Assessment: Normal   Communication Communication Communication: No apparent difficulties   Cognition Arousal: Alert Behavior During Therapy: WFL for tasks assessed/performed Cognition: No apparent impairments                               Following commands: Intact       Cueing  General Comments   Cueing Techniques: Verbal cues;Gestural cues;Tactile cues      Exercises     Shoulder Instructions      Home Living Family/patient expects to be discharged to:: Group home Living Arrangements: Non-relatives/Friends;Group Home Available Help at Discharge: Friend(s);Available PRN/intermittently Type of Home: Group Home Home Access: Ramped entrance     Home Layout: One level     Bathroom Shower/Tub: Chief Strategy Officer: Standard     Home Equipment: Wheelchair - manual;Grab bars - toilet;Shower seat;Grab bars - tub/shower   Additional Comments: toilet rails      Prior Functioning/Environment Prior Level of Function : Needs assist             Mobility Comments: [facility] [employer], indep with w/c transfers typically favoring transfers to his L side, rides the city bus occassionally ADLs Comments: mod indep with basic ADL, gets his groceries himself, group home provides bubble pack medications and insulins shots, sometimes has help for getting to dr appts    OT Problem List: Decreased strength;Decreased activity tolerance;Impaired balance (sitting and/or standing);Decreased knowledge of use of DME or AE   OT  Treatment/Interventions: Self-care/ADL training;Therapeutic exercise;Therapeutic activities;Neuromuscular education;DME and/or AE instruction;Patient/family education;Balance training      OT Goals(Current goals can be found in the care plan section)   Acute Rehab OT Goals Patient Stated Goal: get stronger OT Goal Formulation: With patient Time For Goal Achievement: 05/25/24 Potential to Achieve Goals: Good ADL Goals Pt Will Perform Lower Body Dressing: with modified independence;sitting/lateral leans Pt Will Transfer to Toilet: with modified independence;squat pivot transfer Pt Will Perform Toileting - Clothing Manipulation and hygiene: with modified independence;sitting/lateral leans Additional ADL Goal #1: Pt will demo indep with BUE strengthening program with handout.   OT Frequency:  Min 2X/week    Co-evaluation PT/OT/SLP Co-Evaluation/Treatment: Yes Reason for Co-Treatment: Complexity of the patient's impairments (multi-system involvement);To address functional/ADL transfers;For patient/therapist safety PT goals addressed during session: Mobility/safety with mobility OT goals addressed during session: ADL's and self-care      AM-PAC OT 6 Clicks Daily Activity     Outcome Measure Help from another person eating meals?: None Help from another person taking care of personal grooming?: None Help from another person toileting, which includes using toliet, bedpan, or urinal?: A Little Help from another person bathing (  including washing, rinsing, drying)?: A Little Help from another person to put on and taking off regular upper body clothing?: None Help from another person to put on and taking off regular lower body clothing?: A Little 6 Click Score: 21   End of Session Nurse Communication: Mobility status  Activity Tolerance: Patient tolerated treatment well Patient left: in chair;with call bell/phone within reach;with chair alarm set  OT Visit Diagnosis: Other  abnormalities of gait and mobility (R26.89);Muscle weakness (generalized) (M62.81)                Time: 9076-9054 OT Time Calculation (min): 22 min Charges:  OT General Charges $OT Visit: 1 Visit OT Evaluation $OT Eval Low Complexity: 1 Low  Warren SAUNDERS., MPH, MS, OTR/L ascom [phone] 05/11/24, 1:06 PM
